# Patient Record
Sex: Male | Born: 1935 | Race: White | Hispanic: No | Smoking: Former smoker
Health system: Southern US, Community
[De-identification: ages and names within clinical notes are randomized; demographics above are authoritative.]

## PROBLEM LIST (undated history)

## (undated) DIAGNOSIS — M48061 Spinal stenosis, lumbar region without neurogenic claudication: Secondary | ICD-10-CM

## (undated) DIAGNOSIS — I214 Non-ST elevation (NSTEMI) myocardial infarction: Secondary | ICD-10-CM

## (undated) DIAGNOSIS — I2510 Atherosclerotic heart disease of native coronary artery without angina pectoris: Principal | ICD-10-CM

## (undated) DIAGNOSIS — I10 Essential (primary) hypertension: Secondary | ICD-10-CM

## (undated) DIAGNOSIS — E785 Hyperlipidemia, unspecified: Secondary | ICD-10-CM

## (undated) DIAGNOSIS — M48062 Spinal stenosis, lumbar region with neurogenic claudication: Secondary | ICD-10-CM

## (undated) DIAGNOSIS — Z9861 Coronary angioplasty status: Principal | ICD-10-CM

## (undated) HISTORY — DX: Spinal stenosis, lumbar region with neurogenic claudication: M48.062

## (undated) HISTORY — DX: Hyperlipidemia, unspecified: E78.5

## (undated) HISTORY — DX: Atherosclerotic heart disease of native coronary artery without angina pectoris: I25.10

## (undated) HISTORY — DX: Spinal stenosis, lumbar region without neurogenic claudication: M48.061

## (undated) HISTORY — DX: Coronary angioplasty status: Z98.61

## (undated) HISTORY — DX: Non-ST elevation (NSTEMI) myocardial infarction: I21.4

## (undated) HISTORY — DX: Essential (primary) hypertension: I10

---

## 1999-09-18 DIAGNOSIS — I2510 Atherosclerotic heart disease of native coronary artery without angina pectoris: Secondary | ICD-10-CM | POA: Insufficient documentation

## 1999-09-18 HISTORY — DX: Atherosclerotic heart disease of native coronary artery without angina pectoris: I25.10

## 1999-09-18 HISTORY — PX: CORONARY STENT INTERVENTION: CATH118234

## 2016-01-10 DIAGNOSIS — R0602 Shortness of breath: Secondary | ICD-10-CM | POA: Diagnosis not present

## 2016-01-10 DIAGNOSIS — R600 Localized edema: Secondary | ICD-10-CM | POA: Diagnosis not present

## 2016-01-10 DIAGNOSIS — N401 Enlarged prostate with lower urinary tract symptoms: Secondary | ICD-10-CM | POA: Diagnosis not present

## 2016-01-10 DIAGNOSIS — R972 Elevated prostate specific antigen [PSA]: Secondary | ICD-10-CM | POA: Diagnosis not present

## 2016-01-12 DIAGNOSIS — N401 Enlarged prostate with lower urinary tract symptoms: Secondary | ICD-10-CM | POA: Diagnosis not present

## 2016-01-12 DIAGNOSIS — Z125 Encounter for screening for malignant neoplasm of prostate: Secondary | ICD-10-CM | POA: Diagnosis not present

## 2016-01-12 DIAGNOSIS — R972 Elevated prostate specific antigen [PSA]: Secondary | ICD-10-CM | POA: Diagnosis not present

## 2016-02-03 DIAGNOSIS — R600 Localized edema: Secondary | ICD-10-CM | POA: Diagnosis not present

## 2016-02-03 DIAGNOSIS — Z9119 Patient's noncompliance with other medical treatment and regimen: Secondary | ICD-10-CM | POA: Diagnosis not present

## 2016-02-03 DIAGNOSIS — Z955 Presence of coronary angioplasty implant and graft: Secondary | ICD-10-CM | POA: Diagnosis not present

## 2016-02-03 DIAGNOSIS — R0600 Dyspnea, unspecified: Secondary | ICD-10-CM | POA: Diagnosis not present

## 2016-02-03 DIAGNOSIS — I2510 Atherosclerotic heart disease of native coronary artery without angina pectoris: Secondary | ICD-10-CM | POA: Diagnosis not present

## 2016-02-10 DIAGNOSIS — I2510 Atherosclerotic heart disease of native coronary artery without angina pectoris: Secondary | ICD-10-CM | POA: Diagnosis not present

## 2016-02-10 DIAGNOSIS — R0600 Dyspnea, unspecified: Secondary | ICD-10-CM | POA: Diagnosis not present

## 2016-02-10 DIAGNOSIS — R0609 Other forms of dyspnea: Secondary | ICD-10-CM | POA: Diagnosis not present

## 2016-02-14 DIAGNOSIS — R0600 Dyspnea, unspecified: Secondary | ICD-10-CM | POA: Diagnosis not present

## 2016-02-14 DIAGNOSIS — I2510 Atherosclerotic heart disease of native coronary artery without angina pectoris: Secondary | ICD-10-CM | POA: Diagnosis not present

## 2016-02-15 DIAGNOSIS — R0600 Dyspnea, unspecified: Secondary | ICD-10-CM | POA: Diagnosis not present

## 2016-02-16 DIAGNOSIS — R0600 Dyspnea, unspecified: Secondary | ICD-10-CM | POA: Diagnosis not present

## 2016-02-16 DIAGNOSIS — I272 Other secondary pulmonary hypertension: Secondary | ICD-10-CM | POA: Diagnosis not present

## 2016-02-16 DIAGNOSIS — R0609 Other forms of dyspnea: Secondary | ICD-10-CM | POA: Diagnosis not present

## 2016-02-16 DIAGNOSIS — I2510 Atherosclerotic heart disease of native coronary artery without angina pectoris: Secondary | ICD-10-CM | POA: Diagnosis not present

## 2016-02-16 DIAGNOSIS — I340 Nonrheumatic mitral (valve) insufficiency: Secondary | ICD-10-CM | POA: Diagnosis not present

## 2016-02-16 DIAGNOSIS — I071 Rheumatic tricuspid insufficiency: Secondary | ICD-10-CM | POA: Diagnosis not present

## 2016-02-20 DIAGNOSIS — I10 Essential (primary) hypertension: Secondary | ICD-10-CM | POA: Diagnosis not present

## 2016-02-20 DIAGNOSIS — E669 Obesity, unspecified: Secondary | ICD-10-CM | POA: Diagnosis not present

## 2016-02-20 DIAGNOSIS — R0609 Other forms of dyspnea: Secondary | ICD-10-CM | POA: Diagnosis not present

## 2016-02-20 DIAGNOSIS — R600 Localized edema: Secondary | ICD-10-CM | POA: Diagnosis not present

## 2016-02-24 DIAGNOSIS — I2510 Atherosclerotic heart disease of native coronary artery without angina pectoris: Secondary | ICD-10-CM | POA: Diagnosis not present

## 2016-02-24 DIAGNOSIS — N400 Enlarged prostate without lower urinary tract symptoms: Secondary | ICD-10-CM | POA: Diagnosis not present

## 2016-02-24 DIAGNOSIS — M79606 Pain in leg, unspecified: Secondary | ICD-10-CM | POA: Diagnosis not present

## 2016-02-24 DIAGNOSIS — E785 Hyperlipidemia, unspecified: Secondary | ICD-10-CM | POA: Diagnosis not present

## 2016-02-24 DIAGNOSIS — R600 Localized edema: Secondary | ICD-10-CM | POA: Diagnosis not present

## 2016-02-24 DIAGNOSIS — M2550 Pain in unspecified joint: Secondary | ICD-10-CM | POA: Diagnosis not present

## 2016-02-27 DIAGNOSIS — M79669 Pain in unspecified lower leg: Secondary | ICD-10-CM | POA: Diagnosis not present

## 2016-02-27 DIAGNOSIS — R609 Edema, unspecified: Secondary | ICD-10-CM | POA: Diagnosis not present

## 2016-03-05 DIAGNOSIS — E785 Hyperlipidemia, unspecified: Secondary | ICD-10-CM | POA: Diagnosis not present

## 2016-03-05 DIAGNOSIS — M79606 Pain in leg, unspecified: Secondary | ICD-10-CM | POA: Diagnosis not present

## 2016-03-05 DIAGNOSIS — R600 Localized edema: Secondary | ICD-10-CM | POA: Diagnosis not present

## 2016-03-05 DIAGNOSIS — M79669 Pain in unspecified lower leg: Secondary | ICD-10-CM | POA: Diagnosis not present

## 2016-03-05 DIAGNOSIS — M2550 Pain in unspecified joint: Secondary | ICD-10-CM | POA: Diagnosis not present

## 2016-03-07 DIAGNOSIS — R739 Hyperglycemia, unspecified: Secondary | ICD-10-CM | POA: Diagnosis not present

## 2016-03-23 DIAGNOSIS — I10 Essential (primary) hypertension: Secondary | ICD-10-CM | POA: Diagnosis not present

## 2016-04-26 DIAGNOSIS — Z23 Encounter for immunization: Secondary | ICD-10-CM | POA: Diagnosis not present

## 2016-05-08 DIAGNOSIS — M79606 Pain in leg, unspecified: Secondary | ICD-10-CM | POA: Diagnosis not present

## 2016-05-08 DIAGNOSIS — M4806 Spinal stenosis, lumbar region: Secondary | ICD-10-CM | POA: Diagnosis not present

## 2016-05-08 DIAGNOSIS — R600 Localized edema: Secondary | ICD-10-CM | POA: Diagnosis not present

## 2016-05-09 DIAGNOSIS — I2510 Atherosclerotic heart disease of native coronary artery without angina pectoris: Secondary | ICD-10-CM | POA: Diagnosis not present

## 2016-05-09 DIAGNOSIS — E7800 Pure hypercholesterolemia, unspecified: Secondary | ICD-10-CM | POA: Diagnosis not present

## 2016-05-09 DIAGNOSIS — Z9119 Patient's noncompliance with other medical treatment and regimen: Secondary | ICD-10-CM | POA: Diagnosis not present

## 2016-06-18 DIAGNOSIS — E7800 Pure hypercholesterolemia, unspecified: Secondary | ICD-10-CM | POA: Diagnosis not present

## 2016-06-18 DIAGNOSIS — I2510 Atherosclerotic heart disease of native coronary artery without angina pectoris: Secondary | ICD-10-CM | POA: Diagnosis not present

## 2016-07-10 DIAGNOSIS — M5416 Radiculopathy, lumbar region: Secondary | ICD-10-CM | POA: Diagnosis not present

## 2016-07-10 DIAGNOSIS — M545 Low back pain: Secondary | ICD-10-CM | POA: Diagnosis not present

## 2016-07-10 DIAGNOSIS — G603 Idiopathic progressive neuropathy: Secondary | ICD-10-CM | POA: Diagnosis not present

## 2016-07-25 DIAGNOSIS — M25561 Pain in right knee: Secondary | ICD-10-CM | POA: Diagnosis not present

## 2016-07-25 DIAGNOSIS — E785 Hyperlipidemia, unspecified: Secondary | ICD-10-CM | POA: Diagnosis not present

## 2016-07-25 DIAGNOSIS — G2581 Restless legs syndrome: Secondary | ICD-10-CM | POA: Diagnosis not present

## 2016-07-25 DIAGNOSIS — M4696 Unspecified inflammatory spondylopathy, lumbar region: Secondary | ICD-10-CM | POA: Diagnosis not present

## 2016-07-25 DIAGNOSIS — I2510 Atherosclerotic heart disease of native coronary artery without angina pectoris: Secondary | ICD-10-CM | POA: Diagnosis not present

## 2016-07-25 DIAGNOSIS — M79606 Pain in leg, unspecified: Secondary | ICD-10-CM | POA: Diagnosis not present

## 2016-07-25 DIAGNOSIS — M4807 Spinal stenosis, lumbosacral region: Secondary | ICD-10-CM | POA: Diagnosis not present

## 2016-08-01 DIAGNOSIS — M4807 Spinal stenosis, lumbosacral region: Secondary | ICD-10-CM | POA: Diagnosis not present

## 2016-08-01 DIAGNOSIS — M25561 Pain in right knee: Secondary | ICD-10-CM | POA: Diagnosis not present

## 2016-08-01 DIAGNOSIS — M4696 Unspecified inflammatory spondylopathy, lumbar region: Secondary | ICD-10-CM | POA: Diagnosis not present

## 2016-08-06 DIAGNOSIS — M4696 Unspecified inflammatory spondylopathy, lumbar region: Secondary | ICD-10-CM | POA: Diagnosis not present

## 2016-08-06 DIAGNOSIS — M4807 Spinal stenosis, lumbosacral region: Secondary | ICD-10-CM | POA: Diagnosis not present

## 2016-08-06 DIAGNOSIS — M25561 Pain in right knee: Secondary | ICD-10-CM | POA: Diagnosis not present

## 2016-08-08 DIAGNOSIS — M4696 Unspecified inflammatory spondylopathy, lumbar region: Secondary | ICD-10-CM | POA: Diagnosis not present

## 2016-08-08 DIAGNOSIS — M4807 Spinal stenosis, lumbosacral region: Secondary | ICD-10-CM | POA: Diagnosis not present

## 2016-08-08 DIAGNOSIS — M25561 Pain in right knee: Secondary | ICD-10-CM | POA: Diagnosis not present

## 2016-08-14 DIAGNOSIS — M4807 Spinal stenosis, lumbosacral region: Secondary | ICD-10-CM | POA: Diagnosis not present

## 2016-08-14 DIAGNOSIS — M25561 Pain in right knee: Secondary | ICD-10-CM | POA: Diagnosis not present

## 2016-08-14 DIAGNOSIS — M4696 Unspecified inflammatory spondylopathy, lumbar region: Secondary | ICD-10-CM | POA: Diagnosis not present

## 2016-08-16 DIAGNOSIS — M25561 Pain in right knee: Secondary | ICD-10-CM | POA: Diagnosis not present

## 2016-08-16 DIAGNOSIS — M4696 Unspecified inflammatory spondylopathy, lumbar region: Secondary | ICD-10-CM | POA: Diagnosis not present

## 2016-08-16 DIAGNOSIS — M4807 Spinal stenosis, lumbosacral region: Secondary | ICD-10-CM | POA: Diagnosis not present

## 2016-08-20 DIAGNOSIS — M4696 Unspecified inflammatory spondylopathy, lumbar region: Secondary | ICD-10-CM | POA: Diagnosis not present

## 2016-08-20 DIAGNOSIS — M4807 Spinal stenosis, lumbosacral region: Secondary | ICD-10-CM | POA: Diagnosis not present

## 2016-08-20 DIAGNOSIS — M25561 Pain in right knee: Secondary | ICD-10-CM | POA: Diagnosis not present

## 2016-08-22 DIAGNOSIS — M4807 Spinal stenosis, lumbosacral region: Secondary | ICD-10-CM | POA: Diagnosis not present

## 2016-08-22 DIAGNOSIS — M25561 Pain in right knee: Secondary | ICD-10-CM | POA: Diagnosis not present

## 2016-08-22 DIAGNOSIS — M4696 Unspecified inflammatory spondylopathy, lumbar region: Secondary | ICD-10-CM | POA: Diagnosis not present

## 2016-08-23 DIAGNOSIS — M151 Heberden's nodes (with arthropathy): Secondary | ICD-10-CM | POA: Diagnosis not present

## 2016-08-27 DIAGNOSIS — M25561 Pain in right knee: Secondary | ICD-10-CM | POA: Diagnosis not present

## 2016-08-27 DIAGNOSIS — M4696 Unspecified inflammatory spondylopathy, lumbar region: Secondary | ICD-10-CM | POA: Diagnosis not present

## 2016-08-27 DIAGNOSIS — E7800 Pure hypercholesterolemia, unspecified: Secondary | ICD-10-CM | POA: Diagnosis not present

## 2016-08-27 DIAGNOSIS — M4807 Spinal stenosis, lumbosacral region: Secondary | ICD-10-CM | POA: Diagnosis not present

## 2016-08-29 DIAGNOSIS — M25561 Pain in right knee: Secondary | ICD-10-CM | POA: Diagnosis not present

## 2016-08-29 DIAGNOSIS — M4696 Unspecified inflammatory spondylopathy, lumbar region: Secondary | ICD-10-CM | POA: Diagnosis not present

## 2016-08-29 DIAGNOSIS — M4807 Spinal stenosis, lumbosacral region: Secondary | ICD-10-CM | POA: Diagnosis not present

## 2016-09-19 DIAGNOSIS — M48062 Spinal stenosis, lumbar region with neurogenic claudication: Secondary | ICD-10-CM | POA: Diagnosis not present

## 2016-09-19 DIAGNOSIS — M4696 Unspecified inflammatory spondylopathy, lumbar region: Secondary | ICD-10-CM | POA: Diagnosis not present

## 2016-09-19 DIAGNOSIS — M25561 Pain in right knee: Secondary | ICD-10-CM | POA: Diagnosis not present

## 2016-09-19 DIAGNOSIS — M4807 Spinal stenosis, lumbosacral region: Secondary | ICD-10-CM | POA: Diagnosis not present

## 2016-09-21 DIAGNOSIS — M25561 Pain in right knee: Secondary | ICD-10-CM | POA: Diagnosis not present

## 2016-09-21 DIAGNOSIS — M4696 Unspecified inflammatory spondylopathy, lumbar region: Secondary | ICD-10-CM | POA: Diagnosis not present

## 2016-09-21 DIAGNOSIS — M4807 Spinal stenosis, lumbosacral region: Secondary | ICD-10-CM | POA: Diagnosis not present

## 2016-09-24 DIAGNOSIS — M25561 Pain in right knee: Secondary | ICD-10-CM | POA: Diagnosis not present

## 2016-09-24 DIAGNOSIS — M4696 Unspecified inflammatory spondylopathy, lumbar region: Secondary | ICD-10-CM | POA: Diagnosis not present

## 2016-09-24 DIAGNOSIS — M4807 Spinal stenosis, lumbosacral region: Secondary | ICD-10-CM | POA: Diagnosis not present

## 2016-10-03 DIAGNOSIS — M4807 Spinal stenosis, lumbosacral region: Secondary | ICD-10-CM | POA: Diagnosis not present

## 2016-10-03 DIAGNOSIS — M4696 Unspecified inflammatory spondylopathy, lumbar region: Secondary | ICD-10-CM | POA: Diagnosis not present

## 2016-10-03 DIAGNOSIS — M25561 Pain in right knee: Secondary | ICD-10-CM | POA: Diagnosis not present

## 2016-10-05 DIAGNOSIS — M4807 Spinal stenosis, lumbosacral region: Secondary | ICD-10-CM | POA: Diagnosis not present

## 2016-10-05 DIAGNOSIS — M4696 Unspecified inflammatory spondylopathy, lumbar region: Secondary | ICD-10-CM | POA: Diagnosis not present

## 2016-10-05 DIAGNOSIS — M25561 Pain in right knee: Secondary | ICD-10-CM | POA: Diagnosis not present

## 2016-10-09 DIAGNOSIS — M25561 Pain in right knee: Secondary | ICD-10-CM | POA: Diagnosis not present

## 2016-10-09 DIAGNOSIS — M4696 Unspecified inflammatory spondylopathy, lumbar region: Secondary | ICD-10-CM | POA: Diagnosis not present

## 2016-10-09 DIAGNOSIS — M4807 Spinal stenosis, lumbosacral region: Secondary | ICD-10-CM | POA: Diagnosis not present

## 2016-10-11 DIAGNOSIS — M4696 Unspecified inflammatory spondylopathy, lumbar region: Secondary | ICD-10-CM | POA: Diagnosis not present

## 2016-10-11 DIAGNOSIS — M4807 Spinal stenosis, lumbosacral region: Secondary | ICD-10-CM | POA: Diagnosis not present

## 2016-10-11 DIAGNOSIS — M25561 Pain in right knee: Secondary | ICD-10-CM | POA: Diagnosis not present

## 2016-10-16 DIAGNOSIS — M25561 Pain in right knee: Secondary | ICD-10-CM | POA: Diagnosis not present

## 2016-10-16 DIAGNOSIS — M4696 Unspecified inflammatory spondylopathy, lumbar region: Secondary | ICD-10-CM | POA: Diagnosis not present

## 2016-10-16 DIAGNOSIS — M4807 Spinal stenosis, lumbosacral region: Secondary | ICD-10-CM | POA: Diagnosis not present

## 2016-10-18 DIAGNOSIS — M25561 Pain in right knee: Secondary | ICD-10-CM | POA: Diagnosis not present

## 2016-10-18 DIAGNOSIS — M4696 Unspecified inflammatory spondylopathy, lumbar region: Secondary | ICD-10-CM | POA: Diagnosis not present

## 2016-10-18 DIAGNOSIS — M4807 Spinal stenosis, lumbosacral region: Secondary | ICD-10-CM | POA: Diagnosis not present

## 2016-10-23 DIAGNOSIS — M4696 Unspecified inflammatory spondylopathy, lumbar region: Secondary | ICD-10-CM | POA: Diagnosis not present

## 2016-10-23 DIAGNOSIS — M25561 Pain in right knee: Secondary | ICD-10-CM | POA: Diagnosis not present

## 2016-10-23 DIAGNOSIS — M4807 Spinal stenosis, lumbosacral region: Secondary | ICD-10-CM | POA: Diagnosis not present

## 2016-10-25 DIAGNOSIS — M25561 Pain in right knee: Secondary | ICD-10-CM | POA: Diagnosis not present

## 2016-10-25 DIAGNOSIS — M4696 Unspecified inflammatory spondylopathy, lumbar region: Secondary | ICD-10-CM | POA: Diagnosis not present

## 2016-10-25 DIAGNOSIS — M4807 Spinal stenosis, lumbosacral region: Secondary | ICD-10-CM | POA: Diagnosis not present

## 2016-10-30 DIAGNOSIS — M4696 Unspecified inflammatory spondylopathy, lumbar region: Secondary | ICD-10-CM | POA: Diagnosis not present

## 2016-10-30 DIAGNOSIS — M4807 Spinal stenosis, lumbosacral region: Secondary | ICD-10-CM | POA: Diagnosis not present

## 2016-10-30 DIAGNOSIS — M25561 Pain in right knee: Secondary | ICD-10-CM | POA: Diagnosis not present

## 2016-11-01 DIAGNOSIS — M4807 Spinal stenosis, lumbosacral region: Secondary | ICD-10-CM | POA: Diagnosis not present

## 2016-11-01 DIAGNOSIS — M4696 Unspecified inflammatory spondylopathy, lumbar region: Secondary | ICD-10-CM | POA: Diagnosis not present

## 2016-11-01 DIAGNOSIS — M25561 Pain in right knee: Secondary | ICD-10-CM | POA: Diagnosis not present

## 2016-11-13 DIAGNOSIS — M4807 Spinal stenosis, lumbosacral region: Secondary | ICD-10-CM | POA: Diagnosis not present

## 2016-11-13 DIAGNOSIS — E785 Hyperlipidemia, unspecified: Secondary | ICD-10-CM | POA: Diagnosis not present

## 2016-11-13 DIAGNOSIS — G2581 Restless legs syndrome: Secondary | ICD-10-CM | POA: Diagnosis not present

## 2016-11-13 DIAGNOSIS — R739 Hyperglycemia, unspecified: Secondary | ICD-10-CM | POA: Diagnosis not present

## 2016-11-13 DIAGNOSIS — N400 Enlarged prostate without lower urinary tract symptoms: Secondary | ICD-10-CM | POA: Diagnosis not present

## 2016-11-13 DIAGNOSIS — I2510 Atherosclerotic heart disease of native coronary artery without angina pectoris: Secondary | ICD-10-CM | POA: Diagnosis not present

## 2016-11-13 DIAGNOSIS — R600 Localized edema: Secondary | ICD-10-CM | POA: Diagnosis not present

## 2016-11-27 DIAGNOSIS — Z955 Presence of coronary angioplasty implant and graft: Secondary | ICD-10-CM | POA: Diagnosis not present

## 2016-11-27 DIAGNOSIS — E7800 Pure hypercholesterolemia, unspecified: Secondary | ICD-10-CM | POA: Diagnosis not present

## 2016-11-27 DIAGNOSIS — I10 Essential (primary) hypertension: Secondary | ICD-10-CM | POA: Diagnosis not present

## 2016-11-27 DIAGNOSIS — Z9189 Other specified personal risk factors, not elsewhere classified: Secondary | ICD-10-CM | POA: Diagnosis not present

## 2016-11-27 DIAGNOSIS — I2510 Atherosclerotic heart disease of native coronary artery without angina pectoris: Secondary | ICD-10-CM | POA: Diagnosis not present

## 2016-11-28 DIAGNOSIS — I2510 Atherosclerotic heart disease of native coronary artery without angina pectoris: Secondary | ICD-10-CM | POA: Diagnosis not present

## 2016-11-28 DIAGNOSIS — E7800 Pure hypercholesterolemia, unspecified: Secondary | ICD-10-CM | POA: Diagnosis not present

## 2016-12-17 DIAGNOSIS — E7800 Pure hypercholesterolemia, unspecified: Secondary | ICD-10-CM | POA: Diagnosis not present

## 2016-12-17 DIAGNOSIS — I2510 Atherosclerotic heart disease of native coronary artery without angina pectoris: Secondary | ICD-10-CM | POA: Diagnosis not present

## 2016-12-19 DIAGNOSIS — N401 Enlarged prostate with lower urinary tract symptoms: Secondary | ICD-10-CM | POA: Diagnosis not present

## 2016-12-19 DIAGNOSIS — R972 Elevated prostate specific antigen [PSA]: Secondary | ICD-10-CM | POA: Diagnosis not present

## 2016-12-31 DIAGNOSIS — R972 Elevated prostate specific antigen [PSA]: Secondary | ICD-10-CM | POA: Diagnosis not present

## 2016-12-31 DIAGNOSIS — I10 Essential (primary) hypertension: Secondary | ICD-10-CM | POA: Diagnosis not present

## 2017-01-22 DIAGNOSIS — G8929 Other chronic pain: Secondary | ICD-10-CM | POA: Diagnosis not present

## 2017-01-22 DIAGNOSIS — N400 Enlarged prostate without lower urinary tract symptoms: Secondary | ICD-10-CM | POA: Diagnosis not present

## 2017-01-22 DIAGNOSIS — M25561 Pain in right knee: Secondary | ICD-10-CM | POA: Diagnosis not present

## 2017-01-22 DIAGNOSIS — G2581 Restless legs syndrome: Secondary | ICD-10-CM | POA: Diagnosis not present

## 2017-01-22 DIAGNOSIS — M25562 Pain in left knee: Secondary | ICD-10-CM | POA: Diagnosis not present

## 2017-01-22 DIAGNOSIS — R739 Hyperglycemia, unspecified: Secondary | ICD-10-CM | POA: Diagnosis not present

## 2017-01-22 DIAGNOSIS — I2510 Atherosclerotic heart disease of native coronary artery without angina pectoris: Secondary | ICD-10-CM | POA: Diagnosis not present

## 2017-01-22 DIAGNOSIS — Z955 Presence of coronary angioplasty implant and graft: Secondary | ICD-10-CM | POA: Diagnosis not present

## 2017-01-22 DIAGNOSIS — E785 Hyperlipidemia, unspecified: Secondary | ICD-10-CM | POA: Diagnosis not present

## 2017-01-22 DIAGNOSIS — M4807 Spinal stenosis, lumbosacral region: Secondary | ICD-10-CM | POA: Diagnosis not present

## 2017-02-05 DIAGNOSIS — E7800 Pure hypercholesterolemia, unspecified: Secondary | ICD-10-CM | POA: Diagnosis not present

## 2017-02-05 DIAGNOSIS — M4696 Unspecified inflammatory spondylopathy, lumbar region: Secondary | ICD-10-CM | POA: Diagnosis not present

## 2017-02-05 DIAGNOSIS — M170 Bilateral primary osteoarthritis of knee: Secondary | ICD-10-CM | POA: Diagnosis not present

## 2017-05-15 DIAGNOSIS — I10 Essential (primary) hypertension: Secondary | ICD-10-CM | POA: Diagnosis not present

## 2017-05-15 DIAGNOSIS — E785 Hyperlipidemia, unspecified: Secondary | ICD-10-CM | POA: Diagnosis not present

## 2017-05-15 DIAGNOSIS — Z0000 Encounter for general adult medical examination without abnormal findings: Secondary | ICD-10-CM | POA: Diagnosis not present

## 2017-05-27 DIAGNOSIS — M545 Low back pain: Secondary | ICD-10-CM | POA: Diagnosis not present

## 2017-05-27 DIAGNOSIS — M62830 Muscle spasm of back: Secondary | ICD-10-CM | POA: Diagnosis not present

## 2017-05-27 DIAGNOSIS — M5136 Other intervertebral disc degeneration, lumbar region: Secondary | ICD-10-CM | POA: Diagnosis not present

## 2017-06-05 DIAGNOSIS — N5201 Erectile dysfunction due to arterial insufficiency: Secondary | ICD-10-CM | POA: Diagnosis not present

## 2017-06-05 DIAGNOSIS — N401 Enlarged prostate with lower urinary tract symptoms: Secondary | ICD-10-CM | POA: Diagnosis not present

## 2017-06-05 DIAGNOSIS — R350 Frequency of micturition: Secondary | ICD-10-CM | POA: Diagnosis not present

## 2017-06-05 DIAGNOSIS — N138 Other obstructive and reflux uropathy: Secondary | ICD-10-CM | POA: Diagnosis not present

## 2017-06-17 DIAGNOSIS — I2510 Atherosclerotic heart disease of native coronary artery without angina pectoris: Secondary | ICD-10-CM | POA: Diagnosis not present

## 2017-06-17 DIAGNOSIS — I10 Essential (primary) hypertension: Secondary | ICD-10-CM | POA: Diagnosis not present

## 2017-06-17 DIAGNOSIS — E782 Mixed hyperlipidemia: Secondary | ICD-10-CM | POA: Diagnosis not present

## 2017-06-17 DIAGNOSIS — Z955 Presence of coronary angioplasty implant and graft: Secondary | ICD-10-CM | POA: Diagnosis not present

## 2017-06-23 DIAGNOSIS — Z23 Encounter for immunization: Secondary | ICD-10-CM | POA: Diagnosis not present

## 2017-07-03 ENCOUNTER — Encounter (HOSPITAL_BASED_OUTPATIENT_CLINIC_OR_DEPARTMENT_OTHER): Payer: Self-pay | Admitting: *Deleted

## 2017-07-03 ENCOUNTER — Emergency Department (HOSPITAL_BASED_OUTPATIENT_CLINIC_OR_DEPARTMENT_OTHER)
Admission: EM | Admit: 2017-07-03 | Discharge: 2017-07-03 | Disposition: A | Discharge status: Home / Self Care | Payer: Medicare Other | Attending: Emergency Medicine | Admitting: Emergency Medicine

## 2017-07-03 ENCOUNTER — Emergency Department (HOSPITAL_BASED_OUTPATIENT_CLINIC_OR_DEPARTMENT_OTHER): Payer: Medicare Other

## 2017-07-03 DIAGNOSIS — R42 Dizziness and giddiness: Secondary | ICD-10-CM | POA: Diagnosis not present

## 2017-07-03 DIAGNOSIS — Z7982 Long term (current) use of aspirin: Secondary | ICD-10-CM | POA: Diagnosis not present

## 2017-07-03 DIAGNOSIS — Z955 Presence of coronary angioplasty implant and graft: Secondary | ICD-10-CM | POA: Diagnosis not present

## 2017-07-03 DIAGNOSIS — Y829 Unspecified medical devices associated with adverse incidents: Secondary | ICD-10-CM | POA: Diagnosis not present

## 2017-07-03 DIAGNOSIS — R4589 Other symptoms and signs involving emotional state: Secondary | ICD-10-CM | POA: Diagnosis not present

## 2017-07-03 DIAGNOSIS — Z79899 Other long term (current) drug therapy: Secondary | ICD-10-CM | POA: Insufficient documentation

## 2017-07-03 DIAGNOSIS — T50B95A Adverse effect of other viral vaccines, initial encounter: Secondary | ICD-10-CM | POA: Diagnosis not present

## 2017-07-03 DIAGNOSIS — I10 Essential (primary) hypertension: Secondary | ICD-10-CM | POA: Diagnosis not present

## 2017-07-03 DIAGNOSIS — I2510 Atherosclerotic heart disease of native coronary artery without angina pectoris: Secondary | ICD-10-CM | POA: Diagnosis not present

## 2017-07-03 DIAGNOSIS — R9431 Abnormal electrocardiogram [ECG] [EKG]: Secondary | ICD-10-CM | POA: Diagnosis not present

## 2017-07-03 DIAGNOSIS — T887XXA Unspecified adverse effect of drug or medicament, initial encounter: Secondary | ICD-10-CM | POA: Diagnosis not present

## 2017-07-03 DIAGNOSIS — T50Z95A Adverse effect of other vaccines and biological substances, initial encounter: Secondary | ICD-10-CM | POA: Diagnosis not present

## 2017-07-03 DIAGNOSIS — R6889 Other general symptoms and signs: Secondary | ICD-10-CM | POA: Diagnosis not present

## 2017-07-03 DIAGNOSIS — R51 Headache: Secondary | ICD-10-CM | POA: Diagnosis present

## 2017-07-03 LAB — CBC WITH DIFFERENTIAL/PLATELET
Basophils Absolute: 0 10*3/uL (ref 0.0–0.1)
Basophils Relative: 1 %
EOS ABS: 0.1 10*3/uL (ref 0.0–0.7)
Eosinophils Relative: 1 %
HEMATOCRIT: 39.4 % (ref 39.0–52.0)
HEMOGLOBIN: 13.5 g/dL (ref 13.0–17.0)
LYMPHS ABS: 1.1 10*3/uL (ref 0.7–4.0)
LYMPHS PCT: 23 %
MCH: 30.5 pg (ref 26.0–34.0)
MCHC: 34.3 g/dL (ref 30.0–36.0)
MCV: 89.1 fL (ref 78.0–100.0)
Monocytes Absolute: 0.5 10*3/uL (ref 0.1–1.0)
Monocytes Relative: 10 %
NEUTROS ABS: 3.1 10*3/uL (ref 1.7–7.7)
NEUTROS PCT: 65 %
Platelets: 190 10*3/uL (ref 150–400)
RBC: 4.42 MIL/uL (ref 4.22–5.81)
RDW: 13 % (ref 11.5–15.5)
WBC: 4.7 10*3/uL (ref 4.0–10.5)

## 2017-07-03 LAB — URINALYSIS, ROUTINE W REFLEX MICROSCOPIC
BILIRUBIN URINE: NEGATIVE
GLUCOSE, UA: NEGATIVE mg/dL
HGB URINE DIPSTICK: NEGATIVE
Ketones, ur: NEGATIVE mg/dL
Leukocytes, UA: NEGATIVE
Nitrite: NEGATIVE
PH: 6 (ref 5.0–8.0)
Protein, ur: NEGATIVE mg/dL
SPECIFIC GRAVITY, URINE: 1.02 (ref 1.005–1.030)

## 2017-07-03 LAB — COMPREHENSIVE METABOLIC PANEL
ALK PHOS: 53 U/L (ref 38–126)
ALT: 14 U/L — AB (ref 17–63)
ANION GAP: 6 (ref 5–15)
AST: 22 U/L (ref 15–41)
Albumin: 4 g/dL (ref 3.5–5.0)
BILIRUBIN TOTAL: 0.6 mg/dL (ref 0.3–1.2)
BUN: 14 mg/dL (ref 6–20)
CALCIUM: 8.9 mg/dL (ref 8.9–10.3)
CO2: 26 mmol/L (ref 22–32)
CREATININE: 1.06 mg/dL (ref 0.61–1.24)
Chloride: 108 mmol/L (ref 101–111)
GFR calc non Af Amer: >60 mL/min (ref >60)
GLUCOSE: 111 mg/dL — AB (ref 65–99)
Potassium: 3.4 mmol/L — ABNORMAL LOW (ref 3.5–5.1)
Sodium: 140 mmol/L (ref 135–145)
TOTAL PROTEIN: 6.7 g/dL (ref 6.5–8.1)

## 2017-07-03 LAB — LIPASE, BLOOD: Lipase: 27 U/L (ref 11–51)

## 2017-07-03 LAB — TROPONIN I

## 2017-07-03 MED ORDER — SODIUM CHLORIDE 0.9 % IV BOLUS (SEPSIS)
1000.0000 mL | Freq: Once | INTRAVENOUS | Status: AC
  Administered 2017-07-03: 1000 mL via INTRAVENOUS

## 2017-07-03 MED ORDER — ONDANSETRON 4 MG PO TBDP: ORAL_TABLET | ORAL | 0 refills | Status: DC

## 2017-07-03 MED ORDER — DIPHENHYDRAMINE HCL 12.5 MG/5ML PO ELIX
12.5000 mg | ORAL_SOLUTION | Freq: Once | ORAL | Status: DC
  Filled 2017-07-03: qty 10

## 2017-07-03 MED ORDER — PROCHLORPERAZINE EDISYLATE 5 MG/ML IJ SOLN
5.0000 mg | Freq: Once | INTRAMUSCULAR | Status: AC
  Administered 2017-07-03: 5 mg via INTRAVENOUS
  Filled 2017-07-03: qty 2

## 2017-07-03 MED ORDER — DIPHENHYDRAMINE HCL 50 MG/ML IJ SOLN
12.5000 mg | Freq: Once | INTRAMUSCULAR | Status: DC
  Filled 2017-07-03: qty 1

## 2017-07-03 MED ORDER — DIPHENHYDRAMINE HCL 12.5 MG/5ML PO ELIX: 25.0000 mg | ORAL_SOLUTION | Freq: Once | ORAL | Status: DC

## 2017-07-03 NOTE — Discharge Instructions (Signed)
Return for worsening symptoms.    

## 2017-07-03 NOTE — ED Triage Notes (Addendum)
Pt reports dizziness and intermittent nausea x 2-3 days ago that has now subsided. Repots that he went to his PCP today and they advised him come to the ED to r/o an MI. Pt denies pain, SOB, dizziness, nausea at this time. Reports a very slight HA.

## 2017-07-03 NOTE — ED Provider Notes (Signed)
[facility] EMERGENCY DEPARTMENT Provider Note   CSN: [id number] Arrival date & time: 07/03/17  2458     History   Chief Complaint Chief Complaint  Patient presents with  . Headache    HPI Noah Scott is a 81 y.o. male.  81 yo M with a chief complaints of generalized fatigue and just feeling unwell. This is been going on for the past couple days ever since he received his influenza vaccine as well as his shingles vaccine. He felt that he should be getting a little bit better today and so went to urgent care. While there they became concerned that EKG was abnormal and so sent him here. The patient denies any chest pain or shortness of breath. Denies cough congestion or fever. Has had some subjective chills. Felt that his abdomen was gurgling and has not felt like eating. Denies abdominal pain. Denies diarrhea. The patient has had a slight biparietal headache, denies any sore throat ear pain and facial pain. Denies any dysuria or flank pain.   The history is provided by the patient.  Headache   Pertinent negatives include no fever, no palpitations, no shortness of breath and no vomiting.  Illness  This is a new problem. The current episode started more than 2 days ago. The problem occurs constantly. The problem has not changed since onset.Associated symptoms include headaches. Pertinent negatives include no chest pain, no abdominal pain and no shortness of breath. Nothing aggravates the symptoms. Nothing relieves the symptoms. He has tried nothing for the symptoms. The treatment provided no relief.    Past Medical History:  Diagnosis Date  . CAD (coronary artery disease)   . HTN (hypertension)   . Hyperlipemia     There are no active problems to display for this patient.   Past Surgical History:  Procedure Laterality Date  . CORONARY ANGIOPLASTY WITH STENT PLACEMENT         Home Medications    Prior to Admission medications   Medication Sig Start Date End  Date Taking? Authorizing Provider  aspirin EC 81 MG tablet Take 81 mg by mouth daily.   Yes [provider]  doxazosin (CARDURA) 8 MG tablet Take 8 mg by mouth daily.   Yes [provider]  ezetimibe (ZETIA) 10 MG tablet Take 10 mg by mouth daily.   Yes [provider]  furosemide (LASIX) 20 MG tablet Take 20 mg by mouth.   Yes [provider]  losartan (COZAAR) 25 MG tablet Take 25 mg by mouth daily.   Yes [provider]  naproxen (NAPROSYN) 500 MG tablet Take 500 mg by mouth 2 (two) times daily with a meal.   Yes [provider]  potassium chloride SA (K-DUR,KLOR-CON) 20 MEQ tablet Take 20 mEq by mouth 2 (two) times daily.   Yes [provider]  rosuvastatin (CRESTOR) 20 MG tablet Take 20 mg by mouth daily.   Yes [provider]  ondansetron (ZOFRAN ODT) 4 MG disintegrating tablet 4mg  ODT q4 hours prn nausea/vomit 07/03/17   Deno Etienne, DO    Family History History reviewed. No pertinent family history.  Social History Social History  Substance Use Topics  . Smoking status: Never Smoker  . Smokeless tobacco: Never Used  . Alcohol use No     Allergies   Patient has no known allergies.   Review of Systems Review of Systems  Constitutional: Negative for chills and fever.  HENT: Negative for congestion and facial swelling.  Eyes: Negative for discharge and visual disturbance.  Respiratory: Negative for shortness of breath.   Cardiovascular: Negative for chest pain and palpitations.  Gastrointestinal: Negative for abdominal pain, diarrhea and vomiting.       Abdominal gurgling.   Musculoskeletal: Negative for arthralgias and myalgias.  Skin: Negative for color change and rash.  Neurological: Positive for weakness and headaches. Negative for tremors and syncope.  Psychiatric/Behavioral: Negative for confusion and dysphoric mood.     Physical Exam Updated Vital Signs BP (!) 171/95 (BP Location: Right  Arm)   Pulse 61   Temp 98.6 F (37 C) (Oral)   Resp 18   Ht 5' 10.5" (1.791 m)   Wt 108.9 kg (240 lb)   SpO2 100%   BMI 33.95 kg/m   Physical Exam  Constitutional: He is oriented to person, place, and time. He appears well-developed and well-nourished.  HENT:  Head: Normocephalic and atraumatic.  Eyes: Pupils are equal, round, and reactive to light. EOM are normal.  Neck: Normal range of motion. Neck supple. No JVD present.  Cardiovascular: Normal rate and regular rhythm.  Exam reveals no gallop and no friction rub.   No murmur heard. Pulmonary/Chest: No respiratory distress. He has no wheezes.  Abdominal: He exhibits no distension and no mass. There is no tenderness. There is no rebound and no guarding.  Musculoskeletal: Normal range of motion.  Neurological: He is alert and oriented to person, place, and time. He has normal strength. No cranial nerve deficit or sensory deficit. He displays a negative Romberg sign. Coordination and gait normal. GCS eye subscore is 4. GCS verbal subscore is 5. GCS motor subscore is 6.  Reflex Scores:      Tricep reflexes are 2+ on the right side and 2+ on the left side.      Bicep reflexes are 2+ on the right side and 2+ on the left side.      Brachioradialis reflexes are 2+ on the right side and 2+ on the left side.      Patellar reflexes are 2+ on the right side and 2+ on the left side.      Achilles reflexes are 2+ on the right side and 2+ on the left side. Skin: No rash noted. No pallor.  Psychiatric: He has a normal mood and affect. His behavior is normal.  Nursing note and vitals reviewed.    ED Treatments / Results  Labs (all labs ordered are listed, but only abnormal results are displayed) Labs Reviewed  COMPREHENSIVE METABOLIC PANEL - Abnormal; Notable for the following:       Result Value   Potassium 3.4 (*)    Glucose, Bld 111 (*)    ALT 14 (*)    All other components within normal limits  CBC WITH DIFFERENTIAL/PLATELET    URINALYSIS, ROUTINE W REFLEX MICROSCOPIC  LIPASE, BLOOD  TROPONIN I    EKG  EKG Interpretation  Date/Time:  Wednesday July 03 2017 09:57:53 EDT Ventricular Rate:  68 PR Interval:    QRS Duration: 110 QT Interval:  425 QTC Calculation: 452 R Axis:   -36 Text Interpretation:  Sinus rhythm Atrial premature complex Left axis deviation Abnormal R-wave progression, late transition No old tracing to compare Confirmed by Deno Etienne [id number]) on 07/03/2017 10:03:56 AM       Radiology Dg Chest 2 View  Result Date: 07/03/2017 CLINICAL DATA:  Nausea and dizziness.  Hypertension EXAM: CHEST  2 VIEW COMPARISON:  None. FINDINGS: There is no edema or  consolidation. Heart size and pulmonary vascularity are normal. No adenopathy. Aorta is mildly tortuous. There is degenerative change in the thoracic spine. IMPRESSION: No edema or consolidation. Electronically Signed   By: Lowella Grip III M.D.   On: 07/03/2017 10:32    Procedures Procedures (including critical care time)  Medications Ordered in ED Medications  diphenhydrAMINE (BENADRYL) 12.5 MG/5ML elixir 25 mg (not administered)  sodium chloride 0.9 % bolus 1,000 mL (1,000 mLs Intravenous New Bag/Given 07/03/17 1042)  prochlorperazine (COMPAZINE) injection 5 mg (5 mg Intravenous Given 07/03/17 1050)     Initial Impression / Assessment and Plan / ED Course  I have reviewed the triage vital signs and the nursing notes.  Pertinent labs & imaging results that were available during my care of the patient were reviewed by me and considered in my medical decision making (see chart for details).     81 yo M with a chief complaint of feeling unwell after having his influenza and shingles vaccination a couple days ago. Was sent from urgent care for an abnormal EKG. I evaluated the EKG and feel it's likely due to baseline wander. EKG performed here shows no concerning findings. Patient denies any cardiac symptoms. Symptoms sound most likely  related to his vaccinations. I will check basic labs obtain a chest x-ray UA give a bolus of fluids. Reevaluate.  Patient with improved symptoms.  Ambulatory.  Tolerating PO.    12:12 PM:  I have discussed the diagnosis/risks/treatment options with the patient and believe the pt to be eligible for discharge home to follow-up with PCP. We also discussed returning to the ED immediately if new or worsening sx occur. We discussed the sx which are most concerning (e.g., sudden worsening pain, fever, inability to tolerate by mouth) that necessitate immediate return. Medications administered to the patient during their visit and any new prescriptions provided to the patient are listed below.  Medications given during this visit Medications  diphenhydrAMINE (BENADRYL) 12.5 MG/5ML elixir 25 mg (not administered)  sodium chloride 0.9 % bolus 1,000 mL (1,000 mLs Intravenous New Bag/Given 07/03/17 1042)  prochlorperazine (COMPAZINE) injection 5 mg (5 mg Intravenous Given 07/03/17 1050)     The patient appears reasonably screen and/or stabilized for discharge and I doubt any other medical condition or other [facility] requiring further screening, evaluation, or treatment in the ED at this time prior to discharge.    Final Clinical Impressions(s) / ED Diagnoses   Final diagnoses:  Ill feeling  Vaccine reaction, initial encounter    New Prescriptions New Prescriptions   ONDANSETRON (ZOFRAN ODT) 4 MG DISINTEGRATING TABLET    4mg  ODT q4 hours prn nausea/vomit     Deno Etienne, DO 07/03/17 1212

## 2017-08-14 DIAGNOSIS — E876 Hypokalemia: Secondary | ICD-10-CM | POA: Diagnosis not present

## 2017-08-14 DIAGNOSIS — Z87891 Personal history of nicotine dependence: Secondary | ICD-10-CM | POA: Diagnosis not present

## 2017-08-14 DIAGNOSIS — I10 Essential (primary) hypertension: Secondary | ICD-10-CM | POA: Diagnosis not present

## 2017-09-07 ENCOUNTER — Other Ambulatory Visit: Payer: Self-pay

## 2017-09-07 ENCOUNTER — Encounter (HOSPITAL_BASED_OUTPATIENT_CLINIC_OR_DEPARTMENT_OTHER): Payer: Self-pay | Admitting: Emergency Medicine

## 2017-09-07 ENCOUNTER — Emergency Department (HOSPITAL_BASED_OUTPATIENT_CLINIC_OR_DEPARTMENT_OTHER)
Admission: EM | Admit: 2017-09-07 | Discharge: 2017-09-07 | Disposition: A | Discharge status: Home / Self Care | Payer: Medicare Other | Attending: Emergency Medicine | Admitting: Emergency Medicine

## 2017-09-07 DIAGNOSIS — I10 Essential (primary) hypertension: Secondary | ICD-10-CM | POA: Insufficient documentation

## 2017-09-07 DIAGNOSIS — H1033 Unspecified acute conjunctivitis, bilateral: Secondary | ICD-10-CM | POA: Diagnosis not present

## 2017-09-07 DIAGNOSIS — I2510 Atherosclerotic heart disease of native coronary artery without angina pectoris: Secondary | ICD-10-CM | POA: Diagnosis not present

## 2017-09-07 DIAGNOSIS — H579 Unspecified disorder of eye and adnexa: Secondary | ICD-10-CM | POA: Diagnosis present

## 2017-09-07 MED ORDER — POLYMYXIN B-TRIMETHOPRIM 10000-0.1 UNIT/ML-% OP SOLN: 2.0000 [drp] | Freq: Four times a day (QID) | OPHTHALMIC | 0 refills | Status: AC

## 2017-09-07 NOTE — ED Triage Notes (Signed)
PT presents with c/o pink to both eyes since he woke up this morning

## 2017-09-07 NOTE — ED Notes (Signed)
Pt states he woke up this morning with a crusty drainage on both of his eyelids. Some blurred vision with same. Denies itching or other s/s.

## 2017-09-07 NOTE — Discharge Instructions (Signed)
You were seen in the ED with eye redness and drainage. We are starting you on drops which should help but this may take several days to fully resolve. Wash your hands frequently. Return to the ED with any new or worsening symptoms.

## 2017-09-07 NOTE — ED Provider Notes (Signed)
Emergency Department Provider Note   I have reviewed the triage vital signs and the nursing notes.   HISTORY  Chief Complaint Conjunctivitis   HPI Noah Scott is a 81 y.o. male presents to the emergency department for evaluation of bilateral eye redness and discharge.  He denies any pain or vision changes.  No trauma to the eye.  He has had some recent sneezing and mild runny nose.  When he woke up this morning he states his eyelids were stuck together and he had to pull them apart.  He is continued to have discharge throughout the day.  No pain with eye movement.  No sore throat, fevers, chills.    Past Medical History:  Diagnosis Date  . CAD (coronary artery disease)   . HTN (hypertension)   . Hyperlipemia     There are no active problems to display for this patient.   Past Surgical History:  Procedure Laterality Date  . CORONARY ANGIOPLASTY WITH STENT PLACEMENT      Current Outpatient Rx  . Order #: [id number] Class: Historical Med  . Order #: [id number] Class: Historical Med  . Order #: [id number] Class: Historical Med  . Order #: [id number] Class: Historical Med  . Order #: [id number] Class: Historical Med  . Order #: [id number] Class: Historical Med  . Order #: [id number] Class: Print  . Order #: [id number] Class: Historical Med  . Order #: [id number] Class: Historical Med  . Order #: [id number] Class: Print    Allergies Patient has no known allergies.  No family history on file.  Social History Social History   Tobacco Use  . Smoking status: Never Smoker  . Smokeless tobacco: Never Used  Substance Use Topics  . Alcohol use: No  . Drug use: No    Review of Systems  Constitutional: No fever/chills Eyes: No visual changes. Positive eye redness and discharge.  ENT: No sore throat. Cardiovascular: Denies chest pain. Respiratory: Denies shortness of breath. Gastrointestinal: No abdominal pain.  No nausea, no vomiting.  No diarrhea.  No constipation. Genitourinary:  Negative for dysuria. Musculoskeletal: Negative for back pain. Skin: Negative for rash. Neurological: Negative for headaches, focal weakness or numbness.  10-point ROS otherwise negative.  ____________________________________________   PHYSICAL EXAM:  VITAL SIGNS: ED Triage Vitals [09/07/17 1943]  Enc Vitals Group     BP (!) 146/79     Pulse Rate 72     Resp 18     Temp 97.9 F (36.6 C)     Temp Source Oral     SpO2 98 %   Constitutional: Alert and oriented. Well appearing and in no acute distress. Eyes: Conjunctivae are injected bilateral R>L. PERRL. EOMI. Head: Atraumatic. Nose: No congestion/rhinnorhea. Mouth/Throat: Mucous membranes are moist. Neck: No stridor.  Cardiovascular:  Good peripheral circulation.  Respiratory: Normal respiratory effort. Gastrointestinal:  No distention.  Musculoskeletal: No lower extremity tenderness nor edema. No gross deformities of extremities. Neurologic:  Normal speech and language. No gross focal neurologic deficits are appreciated.  Skin:  Skin is warm, dry and intact. No rash noted.  ____________________________________________   PROCEDURES  Procedure(s) performed:   Procedures  None ____________________________________________   INITIAL IMPRESSION / ASSESSMENT AND PLAN / ED COURSE  Pertinent labs & imaging results that were available during my care of the patient were reviewed by me and considered in my medical decision making (see chart for details).  Patient presents emergency department for evaluation of eye redness and discharge.  Right eye slightly worse than the left.  No periorbital edema.  Exam  not consistent with orbital cellulitis.  Plan for Polytrim drops.  Patient does not use contact lenses.   At this time, I do not feel there is any life-threatening condition present. I have reviewed and discussed all results (EKG, imaging, lab, urine as appropriate), exam findings with patient. I have reviewed nursing  notes and appropriate previous records.  I feel the patient is safe to be discharged home without further emergent workup. Discussed usual and customary return precautions. Patient and family (if present) verbalize understanding and are comfortable with this plan.  Patient will follow-up with their primary care provider. If they do not have a primary care provider, information for follow-up has been provided to them. All questions have been answered.  ____________________________________________  FINAL CLINICAL IMPRESSION(S) / ED DIAGNOSES  Final diagnoses:  Acute conjunctivitis of both eyes, unspecified acute conjunctivitis type    NEW OUTPATIENT MEDICATIONS STARTED DURING THIS VISIT:  Polytrim drops x 7 days.   Note:  This document was prepared using Dragon voice recognition software and may include unintentional dictation errors.  Nanda Quinton, MD Emergency Medicine    Leonidas Boateng, [facility], MD 09/07/17 2001

## 2017-09-07 NOTE — ED Notes (Signed)
Pt given d/c instructions as per chart. Rx x 1. Verbalizes understanding. No questions. 

## 2017-09-12 DIAGNOSIS — H01002 Unspecified blepharitis right lower eyelid: Secondary | ICD-10-CM | POA: Diagnosis not present

## 2017-09-12 DIAGNOSIS — B301 Conjunctivitis due to adenovirus: Secondary | ICD-10-CM | POA: Diagnosis not present

## 2017-09-12 DIAGNOSIS — H2513 Age-related nuclear cataract, bilateral: Secondary | ICD-10-CM | POA: Diagnosis not present

## 2017-09-12 DIAGNOSIS — H01001 Unspecified blepharitis right upper eyelid: Secondary | ICD-10-CM | POA: Diagnosis not present

## 2017-09-14 ENCOUNTER — Other Ambulatory Visit: Payer: Self-pay

## 2017-09-14 ENCOUNTER — Emergency Department (HOSPITAL_BASED_OUTPATIENT_CLINIC_OR_DEPARTMENT_OTHER): Payer: Medicare Other

## 2017-09-14 ENCOUNTER — Emergency Department (HOSPITAL_BASED_OUTPATIENT_CLINIC_OR_DEPARTMENT_OTHER)
Admission: EM | Admit: 2017-09-14 | Discharge: 2017-09-14 | Disposition: A | Discharge status: Home / Self Care | Payer: Medicare Other | Attending: Emergency Medicine | Admitting: Emergency Medicine

## 2017-09-14 ENCOUNTER — Encounter (HOSPITAL_BASED_OUTPATIENT_CLINIC_OR_DEPARTMENT_OTHER): Payer: Self-pay

## 2017-09-14 DIAGNOSIS — I2510 Atherosclerotic heart disease of native coronary artery without angina pectoris: Secondary | ICD-10-CM | POA: Insufficient documentation

## 2017-09-14 DIAGNOSIS — R070 Pain in throat: Secondary | ICD-10-CM | POA: Diagnosis present

## 2017-09-14 DIAGNOSIS — R05 Cough: Secondary | ICD-10-CM | POA: Diagnosis not present

## 2017-09-14 DIAGNOSIS — Z7982 Long term (current) use of aspirin: Secondary | ICD-10-CM | POA: Insufficient documentation

## 2017-09-14 DIAGNOSIS — Z79899 Other long term (current) drug therapy: Secondary | ICD-10-CM | POA: Diagnosis not present

## 2017-09-14 DIAGNOSIS — I10 Essential (primary) hypertension: Secondary | ICD-10-CM | POA: Diagnosis not present

## 2017-09-14 DIAGNOSIS — J029 Acute pharyngitis, unspecified: Secondary | ICD-10-CM | POA: Diagnosis not present

## 2017-09-14 MED ORDER — AZITHROMYCIN 250 MG PO TABS: ORAL_TABLET | ORAL | 0 refills | Status: DC

## 2017-09-14 MED ORDER — PREDNISONE 10 MG PO TABS: 20.0000 mg | ORAL_TABLET | Freq: Two times a day (BID) | ORAL | 0 refills | Status: DC

## 2017-09-14 NOTE — Discharge Instructions (Signed)
Zithromax as prescribed.  Prednisone as prescribed.  Follow-up with ENT if not improving in the next 3-4 days.  The contact information for Dr. Benjamine Mola has been provided in this discharge summary for you to call and make these arrangements.  Return to the ER in the meantime if symptoms significantly worsen or change.

## 2017-09-14 NOTE — ED Notes (Signed)
Pt verbalizes understanding of d/c instructions and denies any further need at this time. 

## 2017-09-14 NOTE — ED Triage Notes (Signed)
Pt feels like there is something stuck in his throat and he cannot cough it up for the last day.  He does not recall anything getting stuck, states his voice has changed because of it.  He is able to eat a drink without issue and is in no respiratory distress.

## 2017-09-14 NOTE — ED Provider Notes (Signed)
[place] [facility] EMERGENCY DEPARTMENT Provider Note   CSN: [id number] Arrival date & time: 09/14/17  0410     History   Chief Complaint Chief Complaint  Patient presents with  . Sore Throat    HPI Noah Scott is a 81 y.o. male.  Patient is a 81 year old male with past medical history of coronary artery disease, hypertension presenting for evaluation of sore throat.  He reports that yesterday he felt as though he might be getting a cold, then his throat became scratchy.  He feels as though there is something lining the inside of his throat.  He reports his voice seems to be changing.  He denies any difficulty eating or drinking.  He feels as though something is stuck in his neck.   The history is provided by the patient.  Sore Throat  This is a new problem. The current episode started yesterday. The problem occurs constantly. The problem has been gradually worsening. Pertinent negatives include no chest pain and no shortness of breath. The symptoms are aggravated by swallowing. Nothing relieves the symptoms. He has tried nothing for the symptoms. The treatment provided no relief.    Past Medical History:  Diagnosis Date  . CAD (coronary artery disease)   . HTN (hypertension)   . Hyperlipemia     There are no active problems to display for this patient.   Past Surgical History:  Procedure Laterality Date  . CORONARY ANGIOPLASTY WITH STENT PLACEMENT         Home Medications    Prior to Admission medications   Medication Sig Start Date End Date Taking? Authorizing Provider  aspirin EC 81 MG tablet Take 81 mg by mouth daily.    [provider]  doxazosin (CARDURA) 8 MG tablet Take 8 mg by mouth daily.    [provider]  ezetimibe (ZETIA) 10 MG tablet Take 10 mg by mouth daily.    [provider]  furosemide (LASIX) 20 MG tablet Take 20 mg by mouth.    [provider]  losartan (COZAAR) 25 MG tablet Take 25 mg by mouth daily.     [provider]  naproxen (NAPROSYN) 500 MG tablet Take 500 mg by mouth 2 (two) times daily with a meal.    [provider]  ondansetron (ZOFRAN ODT) 4 MG disintegrating tablet 4mg  ODT q4 hours prn nausea/vomit 07/03/17   Deno Etienne, DO  potassium chloride SA (K-DUR,KLOR-CON) 20 MEQ tablet Take 20 mEq by mouth 2 (two) times daily.    [provider]  rosuvastatin (CRESTOR) 20 MG tablet Take 20 mg by mouth daily.    [provider]  trimethoprim-polymyxin b (POLYTRIM) ophthalmic solution Place 2 drops into both eyes every 6 (six) hours for 7 days. 09/07/17 09/14/17  [facility], MD    Family History No family history on file.  Social History Social History   Tobacco Use  . Smoking status: Never Smoker  . Smokeless tobacco: Never Used  Substance Use Topics  . Alcohol use: No  . Drug use: No     Allergies   Patient has no known allergies.   Review of Systems Review of Systems  Respiratory: Negative for shortness of breath.   Cardiovascular: Negative for chest pain.  All other systems reviewed and are negative.    Physical Exam Updated Vital Signs BP (!) 153/89 (BP Location: Right Arm)   Pulse 72   Temp 98.3 F (36.8 C) (Oral)   Resp 18  Ht 5\' 10"  (1.778 m)   Wt 108.9 kg (240 lb)   SpO2 100%   BMI 34.44 kg/m   Physical Exam  Constitutional: He is oriented to person, place, and time. He appears well-developed and well-nourished. No distress.  HENT:  Head: Normocephalic and atraumatic.  Mouth/Throat: Uvula is midline, oropharynx is clear and moist and mucous membranes are normal. Mucous membranes are not pale and not dry. No oral lesions. No uvula swelling. No oropharyngeal exudate, posterior oropharyngeal edema, posterior oropharyngeal erythema or tonsillar abscesses.  Esther oropharynx is normal.  I appreciate no abnormality.  There is no swelling or adenopathy of the neck.  Neck: Normal range of motion. Neck supple.    Cardiovascular: Normal rate and regular rhythm. Exam reveals no friction rub.  No murmur heard. Pulmonary/Chest: Effort normal and breath sounds normal. No respiratory distress. He has no wheezes. He has no rales.  Abdominal: Soft. Bowel sounds are normal. He exhibits no distension. There is no tenderness.  Musculoskeletal: Normal range of motion. He exhibits no edema.  Neurological: He is alert and oriented to person, place, and time. Coordination normal.  Skin: Skin is warm and dry. He is not diaphoretic.  Nursing note and vitals reviewed.    ED Treatments / Results  Labs (all labs ordered are listed, but only abnormal results are displayed) Labs Reviewed - No data to display  EKG  EKG Interpretation None       Radiology No results found.  Procedures Procedures (including critical care time)  Medications Ordered in ED Medications - No data to display   Initial Impression / Assessment and Plan / ED Course  I have reviewed the triage vital signs and the nursing notes.  Pertinent labs & imaging results that were available during my care of the patient were reviewed by me and considered in my medical decision making (see chart for details).  Soft tissue neck films reveal no swelling of the epiglottis, foreign body, or obvious airway narrowing.  I see no indication at this time for further workup.  He is breathing, eating, drinking, and speaking without difficulty.  He is concerned he has some sort of infection that may cause his airway to close.  He will be prescribed antibiotics and steroids and is to follow-up with ear nose and throat if not improving.  Final Clinical Impressions(s) / ED Diagnoses   Final diagnoses:  None    ED Discharge Orders    None       Veryl Speak, MD 09/14/17 [phone]

## 2017-10-10 DIAGNOSIS — H01001 Unspecified blepharitis right upper eyelid: Secondary | ICD-10-CM | POA: Diagnosis not present

## 2017-10-10 DIAGNOSIS — B301 Conjunctivitis due to adenovirus: Secondary | ICD-10-CM | POA: Diagnosis not present

## 2017-10-10 DIAGNOSIS — H2513 Age-related nuclear cataract, bilateral: Secondary | ICD-10-CM | POA: Diagnosis not present

## 2017-10-10 DIAGNOSIS — H01002 Unspecified blepharitis right lower eyelid: Secondary | ICD-10-CM | POA: Diagnosis not present

## 2017-11-21 ENCOUNTER — Emergency Department (HOSPITAL_BASED_OUTPATIENT_CLINIC_OR_DEPARTMENT_OTHER): Payer: Medicare Other

## 2017-11-21 ENCOUNTER — Inpatient Hospital Stay (HOSPITAL_BASED_OUTPATIENT_CLINIC_OR_DEPARTMENT_OTHER)
Admission: EM | Admit: 2017-11-21 | Discharge: 2017-11-26 | DRG: 247 | Disposition: A | Discharge status: Home / Self Care | Payer: Medicare Other | Attending: Cardiovascular Disease | Admitting: Cardiovascular Disease

## 2017-11-21 ENCOUNTER — Other Ambulatory Visit: Payer: Self-pay

## 2017-11-21 ENCOUNTER — Encounter (HOSPITAL_BASED_OUTPATIENT_CLINIC_OR_DEPARTMENT_OTHER): Payer: Self-pay | Admitting: Emergency Medicine

## 2017-11-21 DIAGNOSIS — I2510 Atherosclerotic heart disease of native coronary artery without angina pectoris: Secondary | ICD-10-CM | POA: Diagnosis not present

## 2017-11-21 DIAGNOSIS — Z955 Presence of coronary angioplasty implant and graft: Secondary | ICD-10-CM | POA: Diagnosis not present

## 2017-11-21 DIAGNOSIS — E785 Hyperlipidemia, unspecified: Secondary | ICD-10-CM | POA: Diagnosis present

## 2017-11-21 DIAGNOSIS — Z79899 Other long term (current) drug therapy: Secondary | ICD-10-CM

## 2017-11-21 DIAGNOSIS — Z7982 Long term (current) use of aspirin: Secondary | ICD-10-CM | POA: Diagnosis not present

## 2017-11-21 DIAGNOSIS — Z791 Long term (current) use of non-steroidal anti-inflammatories (NSAID): Secondary | ICD-10-CM

## 2017-11-21 DIAGNOSIS — Z7952 Long term (current) use of systemic steroids: Secondary | ICD-10-CM

## 2017-11-21 DIAGNOSIS — Z87891 Personal history of nicotine dependence: Secondary | ICD-10-CM | POA: Diagnosis not present

## 2017-11-21 DIAGNOSIS — Z8249 Family history of ischemic heart disease and other diseases of the circulatory system: Secondary | ICD-10-CM | POA: Diagnosis not present

## 2017-11-21 DIAGNOSIS — R748 Abnormal levels of other serum enzymes: Secondary | ICD-10-CM | POA: Diagnosis not present

## 2017-11-21 DIAGNOSIS — E2749 Other adrenocortical insufficiency: Secondary | ICD-10-CM

## 2017-11-21 DIAGNOSIS — K219 Gastro-esophageal reflux disease without esophagitis: Secondary | ICD-10-CM | POA: Diagnosis present

## 2017-11-21 DIAGNOSIS — Z6833 Body mass index (BMI) 33.0-33.9, adult: Secondary | ICD-10-CM

## 2017-11-21 DIAGNOSIS — E669 Obesity, unspecified: Secondary | ICD-10-CM | POA: Diagnosis present

## 2017-11-21 DIAGNOSIS — I10 Essential (primary) hypertension: Secondary | ICD-10-CM

## 2017-11-21 DIAGNOSIS — M1990 Unspecified osteoarthritis, unspecified site: Secondary | ICD-10-CM | POA: Diagnosis present

## 2017-11-21 DIAGNOSIS — E782 Mixed hyperlipidemia: Secondary | ICD-10-CM | POA: Diagnosis not present

## 2017-11-21 DIAGNOSIS — E7800 Pure hypercholesterolemia, unspecified: Secondary | ICD-10-CM | POA: Diagnosis not present

## 2017-11-21 DIAGNOSIS — I214 Non-ST elevation (NSTEMI) myocardial infarction: Secondary | ICD-10-CM | POA: Diagnosis not present

## 2017-11-21 DIAGNOSIS — I5030 Unspecified diastolic (congestive) heart failure: Secondary | ICD-10-CM | POA: Diagnosis not present

## 2017-11-21 DIAGNOSIS — R0789 Other chest pain: Secondary | ICD-10-CM | POA: Diagnosis not present

## 2017-11-21 DIAGNOSIS — R079 Chest pain, unspecified: Secondary | ICD-10-CM | POA: Diagnosis not present

## 2017-11-21 DIAGNOSIS — I2583 Coronary atherosclerosis due to lipid rich plaque: Secondary | ICD-10-CM | POA: Diagnosis not present

## 2017-11-21 HISTORY — DX: Essential (primary) hypertension: I10

## 2017-11-21 HISTORY — DX: Non-ST elevation (NSTEMI) myocardial infarction: I21.4

## 2017-11-21 HISTORY — DX: Hyperlipidemia, unspecified: E78.5

## 2017-11-21 LAB — TROPONIN I
Troponin I: 0.03 ng/mL
Troponin I: 0.07 ng/mL
Troponin I: 0.07 ng/mL (ref <0.03)

## 2017-11-21 LAB — CBC WITH DIFFERENTIAL/PLATELET
Basophils Absolute: 0 K/uL (ref 0.0–0.1)
Basophils Relative: 1 %
Eosinophils Absolute: 0.2 K/uL (ref 0.0–0.7)
Eosinophils Relative: 3 %
HCT: 41.6 % (ref 39.0–52.0)
Hemoglobin: 14.2 g/dL (ref 13.0–17.0)
Lymphocytes Relative: 25 %
Lymphs Abs: 1.6 K/uL (ref 0.7–4.0)
MCH: 30.8 pg (ref 26.0–34.0)
MCHC: 34.1 g/dL (ref 30.0–36.0)
MCV: 90.2 fL (ref 78.0–100.0)
Monocytes Absolute: 0.8 K/uL (ref 0.1–1.0)
Monocytes Relative: 12 %
Neutro Abs: 3.8 K/uL (ref 1.7–7.7)
Neutrophils Relative %: 59 %
Platelets: 177 K/uL (ref 150–400)
RBC: 4.61 MIL/uL (ref 4.22–5.81)
RDW: 13.4 % (ref 11.5–15.5)
WBC: 6.3 K/uL (ref 4.0–10.5)

## 2017-11-21 LAB — COMPREHENSIVE METABOLIC PANEL WITH GFR
ALT: 23 U/L (ref 17–63)
AST: 26 U/L (ref 15–41)
Albumin: 4 g/dL (ref 3.5–5.0)
Alkaline Phosphatase: 64 U/L (ref 38–126)
Anion gap: 5 (ref 5–15)
BUN: 17 mg/dL (ref 6–20)
CO2: 28 mmol/L (ref 22–32)
Calcium: 8.9 mg/dL (ref 8.9–10.3)
Chloride: 106 mmol/L (ref 101–111)
Creatinine, Ser: 1.11 mg/dL (ref 0.61–1.24)
GFR calc Af Amer: >60 mL/min
GFR calc non Af Amer: >60 mL/min
Glucose, Bld: 119 mg/dL — ABNORMAL HIGH (ref 65–99)
Potassium: 4.3 mmol/L (ref 3.5–5.1)
Sodium: 139 mmol/L (ref 135–145)
Total Bilirubin: 0.5 mg/dL (ref 0.3–1.2)
Total Protein: 6.8 g/dL (ref 6.5–8.1)

## 2017-11-21 LAB — HEPARIN LEVEL (UNFRACTIONATED): HEPARIN UNFRACTIONATED: 0.34 [IU]/mL (ref 0.30–0.70)

## 2017-11-21 MED ORDER — SODIUM CHLORIDE 0.9% FLUSH
3.0000 mL | INTRAVENOUS | Status: DC | PRN
  Administered 2017-11-23: 3 mL via INTRAVENOUS
  Filled 2017-11-21: qty 3

## 2017-11-21 MED ORDER — POTASSIUM CHLORIDE CRYS ER 20 MEQ PO TBCR
20.0000 meq | EXTENDED_RELEASE_TABLET | Freq: Two times a day (BID) | ORAL | Status: DC
  Administered 2017-11-21 – 2017-11-26 (×11): 20 meq via ORAL
  Filled 2017-11-21 (×11): qty 1

## 2017-11-21 MED ORDER — ROSUVASTATIN CALCIUM 20 MG PO TABS
20.0000 mg | ORAL_TABLET | Freq: Every day | ORAL | Status: DC
  Administered 2017-11-21 – 2017-11-25 (×5): 20 mg via ORAL
  Filled 2017-11-21 (×3): qty 2
  Filled 2017-11-21 (×2): qty 1
  Filled 2017-11-21: qty 2

## 2017-11-21 MED ORDER — EZETIMIBE 10 MG PO TABS
10.0000 mg | ORAL_TABLET | Freq: Every day | ORAL | Status: DC
  Administered 2017-11-21 – 2017-11-26 (×6): 10 mg via ORAL
  Filled 2017-11-21 (×7): qty 1

## 2017-11-21 MED ORDER — FUROSEMIDE 20 MG PO TABS
20.0000 mg | ORAL_TABLET | Freq: Every day | ORAL | Status: DC
  Administered 2017-11-21 – 2017-11-23 (×3): 20 mg via ORAL
  Filled 2017-11-21 (×4): qty 1

## 2017-11-21 MED ORDER — ASPIRIN 81 MG PO CHEW
81.0000 mg | CHEWABLE_TABLET | ORAL | Status: AC
  Administered 2017-11-22: 81 mg via ORAL
  Filled 2017-11-21: qty 1

## 2017-11-21 MED ORDER — DOXAZOSIN MESYLATE 8 MG PO TABS
8.0000 mg | ORAL_TABLET | Freq: Every day | ORAL | Status: DC
  Administered 2017-11-21 – 2017-11-26 (×5): 8 mg via ORAL
  Filled 2017-11-21: qty 1
  Filled 2017-11-21 (×3): qty 4
  Filled 2017-11-21: qty 1
  Filled 2017-11-21: qty 4

## 2017-11-21 MED ORDER — SODIUM CHLORIDE 0.9% FLUSH
3.0000 mL | Freq: Two times a day (BID) | INTRAVENOUS | Status: DC
  Administered 2017-11-23 – 2017-11-24 (×2): 3 mL via INTRAVENOUS

## 2017-11-21 MED ORDER — SODIUM CHLORIDE 0.9 % WEIGHT BASED INFUSION
3.0000 mL/kg/h | INTRAVENOUS | Status: AC
  Administered 2017-11-22: 3 mL/kg/h via INTRAVENOUS

## 2017-11-21 MED ORDER — PANTOPRAZOLE SODIUM 40 MG IV SOLR
40.0000 mg | Freq: Once | INTRAVENOUS | Status: AC
  Administered 2017-11-21: 40 mg via INTRAVENOUS
  Filled 2017-11-21: qty 40

## 2017-11-21 MED ORDER — SODIUM CHLORIDE 0.9 % WEIGHT BASED INFUSION: 1.0000 mL/kg/h | INTRAVENOUS | Status: DC

## 2017-11-21 MED ORDER — LOSARTAN POTASSIUM 50 MG PO TABS
25.0000 mg | ORAL_TABLET | Freq: Every day | ORAL | Status: DC
  Administered 2017-11-21 – 2017-11-25 (×5): 25 mg via ORAL
  Filled 2017-11-21 (×7): qty 1

## 2017-11-21 MED ORDER — SODIUM CHLORIDE 0.9 % IV SOLN: 250.0000 mL | INTRAVENOUS | Status: DC | PRN

## 2017-11-21 MED ORDER — HEPARIN (PORCINE) IN NACL 100-0.45 UNIT/ML-% IJ SOLN
1300.0000 [IU]/h | INTRAMUSCULAR | Status: DC
  Administered 2017-11-21: 1250 [IU]/h via INTRAVENOUS
  Administered 2017-11-21 – 2017-11-25 (×4): 1300 [IU]/h via INTRAVENOUS
  Filled 2017-11-21 (×6): qty 250

## 2017-11-21 MED ORDER — GI COCKTAIL ~~LOC~~
30.0000 mL | Freq: Once | ORAL | Status: AC
  Administered 2017-11-21: 30 mL via ORAL
  Filled 2017-11-21: qty 30

## 2017-11-21 MED ORDER — ONDANSETRON HCL 4 MG/2ML IJ SOLN
4.0000 mg | Freq: Once | INTRAMUSCULAR | Status: AC
  Administered 2017-11-21: 4 mg via INTRAVENOUS
  Filled 2017-11-21: qty 2

## 2017-11-21 MED ORDER — HEPARIN BOLUS VIA INFUSION
4000.0000 [IU] | Freq: Once | INTRAVENOUS | Status: AC
  Administered 2017-11-21: 4000 [IU] via INTRAVENOUS

## 2017-11-21 MED ORDER — ASPIRIN EC 81 MG PO TBEC
81.0000 mg | DELAYED_RELEASE_TABLET | Freq: Every day | ORAL | Status: DC
  Administered 2017-11-22 – 2017-11-26 (×4): 81 mg via ORAL
  Filled 2017-11-21 (×4): qty 1

## 2017-11-21 MED ORDER — ASPIRIN 81 MG PO CHEW
324.0000 mg | CHEWABLE_TABLET | Freq: Once | ORAL | Status: AC
  Administered 2017-11-21: 324 mg via ORAL
  Filled 2017-11-21: qty 4

## 2017-11-21 NOTE — Progress Notes (Signed)
ANTICOAGULATION CONSULT NOTE - Initial Consult  Pharmacy Consult for heparin Indication: chest pain/ACS  No Known Allergies  Patient Measurements: Height: 5\' 10"  (177.8 cm) Weight: 235 lb (106.6 kg) IBW/kg (Calculated) : 73 Heparin Dosing Weight: 96 kg  Vital Signs: Temp: 97.8 F (36.6 C) (03/07 0141) Temp Source: Oral (03/07 0141) BP: 161/86 (03/07 0310) Pulse Rate: 60 (03/07 0310)  Labs: Recent Labs    11/21/17 0237 11/21/17 0532  HGB 14.2  --   HCT 41.6  --   PLT 177  --   CREATININE 1.11  --   TROPONINI 0.03* 0.07*    Estimated Creatinine Clearance: 65.9 mL/min (by C-G formula based on SCr of 1.11 mg/dL).   Medical History: Past Medical History:  Diagnosis Date  . CAD (coronary artery disease)   . HTN (hypertension)   . Hyperlipemia      Assessment: C/o burning sensation in chest, trop 0.07, starting heparin gtt for rule out ACS. CBC ok, SCr 1.1, no anticoagulants prior to admission.  Goal of Therapy:  Heparin level 0.3-0.7 units/ml Monitor platelets by anticoagulation protocol: Yes   Plan:  -Heparin 4000 units x1 then 1250 units/hr -Daily HL, CBC -Level in 8 hours   Harvel Quale 11/21/2017,7:13 AM

## 2017-11-21 NOTE — Discharge Instructions (Addendum)
PLEASE REMEMBER TO BRING ALL OF YOUR MEDICATIONS TO EACH OF YOUR FOLLOW-UP OFFICE VISITS. ° °PLEASE ATTEND ALL SCHEDULED FOLLOW-UP APPOINTMENTS.  ° °Activity: Increase activity slowly as tolerated. You may shower, but no soaking baths (or swimming) for 1 week. No driving for 1 week. No lifting over 5 lbs for 2 weeks. No sexual activity for 1 week.  ° °You May Return to Work: in 3 weeks (if applicable) ° °Wound Care: You may wash cath site gently with soap and water. Keep cath site clean and dry. If you notice pain, swelling, bleeding or pus at your cath site, please call [phone]. ° ° ° °Cardiac Cath Site Care °Refer to this sheet in the next few weeks. These instructions provide you with information on caring for yourself after your procedure. Your caregiver may also give you more specific instructions. Your treatment has been planned according to current medical practices, but problems sometimes occur. Call your caregiver if you have any problems or questions after your procedure. °HOME CARE INSTRUCTIONS °· You may shower 24 hours after the procedure. Remove the bandage (dressing) and gently wash the site with plain soap and water. Gently pat the site dry.  °· Do not apply powder or lotion to the site.  °· Do not sit in a bathtub, swimming pool, or whirlpool for 5 to 7 days.  °· No bending, squatting, or lifting anything over 10 pounds (4.5 kg) as directed by your caregiver.  °· Inspect the site at least twice daily.  °· Do not drive home if you are discharged the same day of the procedure. Have someone else drive you.  °· You may drive 24 hours after the procedure unless otherwise instructed by your caregiver.  °What to expect: °· Any bruising will usually fade within 1 to 2 weeks.  °· Blood that collects in the tissue (hematoma) may be painful to the touch. It should usually decrease in size and tenderness within 1 to 2 weeks.  °SEEK IMMEDIATE MEDICAL CARE IF: °· You have unusual pain at the site or down the  affected limb.  °· You have redness, warmth, swelling, or pain at the site.  °· You have drainage (other than a small amount of blood on the dressing).  °· You have chills.  °· You have a fever or persistent symptoms for more than 72 hours.  °· You have a fever and your symptoms suddenly get worse.  °· Your leg becomes pale, cool, tingly, or numb.  °· You have heavy bleeding from the site. Hold pressure on the site.  °Document Released: 10/06/2010 Document Revised: 08/23/2011 Document Reviewed:  ° °

## 2017-11-21 NOTE — ED Notes (Signed)
Attempted to call report to the floor at this time  - RN not able to come to the phone per Network engineer

## 2017-11-21 NOTE — ED Provider Notes (Signed)
TIME SEEN: 2:18 AM  CHIEF COMPLAINT: Heartburn  HPI: Patient is a 82 year old male with history of hypertension, hyperlipidemia CAD with a stent in 2001 in [place] who presents to the emergency department with 2 days of intermittent chest pain.  Describes it as an anterior pain that he feels like as a burning.  Not exertional or pleuritic.  It is worse after eating and worse with lying flat.  States he thinks it is heartburn but has never had heartburn before.  States he has never had a heart attack before.  States he went and bought Nexium and has been taking it without relief.  Last Nexium tablet was at 6 PM last night.  No tightness or pressure in the chest.  No radiation of pain.  No shortness of breath, nausea, vomiting, diaphoresis, dizziness, fevers, cough, lower extremity swelling or pain, abdominal pain.  Did not try any other medications at home.  Patient is a rather poor historian.  ROS: See HPI Constitutional: no fever  Eyes: no drainage  ENT: no runny nose   Cardiovascular:   + chest pain  Resp: no SOB  GI: no vomiting GU: no dysuria Integumentary: no rash  Allergy: no hives  Musculoskeletal: no leg swelling  Neurological: no slurred speech ROS otherwise negative  PAST MEDICAL HISTORY/PAST SURGICAL HISTORY:  Past Medical History:  Diagnosis Date  . CAD (coronary artery disease)   . HTN (hypertension)   . Hyperlipemia     MEDICATIONS:  Prior to Admission medications   Medication Sig Start Date End Date Taking? Authorizing Provider  aspirin EC 81 MG tablet Take 81 mg by mouth daily.    [provider]  azithromycin (ZITHROMAX Z-PAK) 250 MG tablet 2 po day one, then 1 daily x 4 days 09/14/17   Veryl Speak, MD  doxazosin (CARDURA) 8 MG tablet Take 8 mg by mouth daily.    [provider]  ezetimibe (ZETIA) 10 MG tablet Take 10 mg by mouth daily.    [provider]  furosemide (LASIX) 20 MG tablet Take 20 mg by mouth.    [provider]   losartan (COZAAR) 25 MG tablet Take 25 mg by mouth daily.    [provider]  naproxen (NAPROSYN) 500 MG tablet Take 500 mg by mouth 2 (two) times daily with a meal.    [provider]  ondansetron (ZOFRAN ODT) 4 MG disintegrating tablet 4mg  ODT q4 hours prn nausea/vomit 07/03/17   Deno Etienne, DO  potassium chloride SA (K-DUR,KLOR-CON) 20 MEQ tablet Take 20 mEq by mouth 2 (two) times daily.    [provider]  predniSONE (DELTASONE) 10 MG tablet Take 2 tablets (20 mg total) by mouth 2 (two) times daily. 09/14/17   Veryl Speak, MD  rosuvastatin (CRESTOR) 20 MG tablet Take 20 mg by mouth daily.    [provider]    ALLERGIES:  No Known Allergies  SOCIAL HISTORY:  Social History   Tobacco Use  . Smoking status: Never Smoker  . Smokeless tobacco: Never Used  Substance Use Topics  . Alcohol use: No    FAMILY HISTORY: No family history on file.  EXAM: BP (!) 178/99 (BP Location: Right Arm)   Pulse 63   Temp 97.8 F (36.6 C) (Oral)   Resp 18   Ht 5\' 10"  (1.778 m)   Wt 106.6 kg (235 lb)   SpO2 97%   BMI 33.72 kg/m  CONSTITUTIONAL: Alert and oriented and responds appropriately to questions. Well-appearing; well-nourished  HEAD: Normocephalic EYES: Conjunctivae clear, pupils appear equal, EOMI ENT: normal nose; moist mucous membranes NECK: Supple, no meningismus, no nuchal rigidity, no LAD  CARD: RRR; S1 and S2 appreciated; no murmurs, no clicks, no rubs, no gallops RESP: Normal chest excursion without splinting or tachypnea; breath sounds clear and equal bilaterally; no wheezes, no rhonchi, no rales, no hypoxia or respiratory distress, speaking full sentences ABD/GI: Normal bowel sounds; non-distended; soft, non-tender, no rebound, no guarding, no peritoneal signs, no hepatosplenomegaly BACK:  The back appears normal and is non-tender to palpation, there is no CVA tenderness EXT: Normal ROM in all joints; non-tender to palpation; no edema;  normal capillary refill; no cyanosis, no calf tenderness or swelling    SKIN: Normal color for age and race; warm; no rash NEURO: Moves all extremities equally PSYCH: The patient's mood and manner are appropriate. Grooming and personal hygiene are appropriate.  MEDICAL DECISION MAKING: Patient here with chest pain.  EKG shows inferior T wave abnormalities do not appear changed compared to previous.  There is no new ischemic abnormality.  Low suspicion for ACS but he does have risk factors and states he has never had heartburn before.  He is also a very poor historian.  Will obtain troponin, chest x-ray.  Abdominal exam is benign.  Will give GI cocktail and reassess.  Doubt PE or dissection.  ED PROGRESS: Patient's labs are unremarkable other than a troponin of 0.03 in the setting of normal creatinine.  Chest x-ray is clear.  No significant change in symptoms after GI cocktail.  We have discussed the slightly elevated troponin.  Have discussed admission but patient declines.  He is very reluctant to even stay for a second set of cardiac enzymes.  After lengthy discussion he agrees to second troponin at 5:30 AM.  If this is elevated he is aware that he will need to be admitted to the hospital.  Will give Protonix and Zofran for further symptomatic relief.  Patient is quite argumentative here.  I have spent quite a bit of time talking to him about what these cardiac enzymes mean and why I have ordered them.  I am not convinced that this is all GI in nature.  He is not the best historian and has multiple risk factors for ACS.   6:25 AM  Pt's second troponin is more elevated at 0.07.  I discussed with patient that this is concerning that he is having a heart attack.  I have recommended aspirin and IV heparin.  I have recommended transfer to [facility] via ambulance.  Patient continues to be argumentative stating that he does not feel that this is a heart attack.  He states that he is now asymptomatic  and wants to go home.  I have talked to patient at length that he can have severe and permanent disability and even death without immediate treatment for heart attack.  He states he understands these risks and is able to verbalize this back to me.  He states if he changes his mind he will go immediately to the closest emergency department.  We will give him a full dose aspirin here but unfortunately at this time the patient is oriented x3, appears to understand risk and benefits and I feel I cannot hold him against his will.  He does not appear intoxicated.  He appears to be making these decisions on his own volition.  He will sign out [place].  Nursing staff has also attempted to get  patient to stay for further treatment.   I have also called patient's daughter David Rodriquez at [phone] and left a message.   6:45 AM  Pt now agrees to be admitted to the hospital after further long discussion with patient's nurse.  Will start IV heparin.  Will discuss with cardiology on-call.  His PCP is with [facility].  It appears he is seeing Dr. Marijo File with cardiology in [place] in the past.  We have contacted [facility] but at this time they do not have any beds available and would not be able to accept the patient at this time.  Patient agrees with transport to [facility].  7:06 AM Discussed patient's case with cardiologist on call, Dr. Oval Linsey.  I have recommended admission and patient (and family if present) agree with this plan.  I will place admission orders to telemetry, inpatient per cardiologist request.  They are comfortable with telemetry given patient is hemodynamically stable and currently chest pain-free.  I have also been able to get in touch with patient's daughter who agrees with transport to [facility].  She appreciates that we were able to get patient to stay.  I reviewed all nursing notes, vitals, pertinent previous records, EKGs, lab and urine  results, imaging (as available).       EKG Interpretation  Date/Time:  Thursday November 21 2017 01:42:18 EST Ventricular Rate:  66 PR Interval:    QRS Duration: 107 QT Interval:  405 QTC Calculation: 425 R Axis:   -34 Text Interpretation:  Sinus rhythm Atrial premature complex Left axis deviation Abnormal R-wave progression, late transition Borderline T abnormalities, inferior leads No significant change since last tracing Confirmed by Pryor Curia [phone]) on 11/21/2017 1:48:15 AM         EKG Interpretation  Date/Time:  Thursday November 21 2017 06:58:59 EST Ventricular Rate:  78 PR Interval:    QRS Duration: 98 QT Interval:  391 QTC Calculation: 446 R Axis:   -59 Text Interpretation:  Sinus rhythm Left anterior fascicular block Abnormal R-wave progression, late transition Borderline T abnormalities, diffuse leads No significant change since last tracing Confirmed by Pryor Curia [phone]) on 11/21/2017 7:01:22 AM        CRITICAL CARE Performed by: Cyril Mourning Keatin Benham   Total critical care time: 55 minutes  Critical care time was exclusive of separately billable procedures and treating other patients.  Critical care was necessary to treat or prevent imminent or life-threatening deterioration.  Critical care was time spent personally by me on the following activities: development of treatment plan with patient and/or surrogate as well as nursing, discussions with consultants, evaluation of patient's response to treatment, examination of patient, obtaining history from patient or surrogate, ordering and performing treatments and interventions, ordering and review of laboratory studies, ordering and review of radiographic studies, pulse oximetry and re-evaluation of patient's condition.     Ralphael Southgate, Delice Bison, DO 11/21/17 [phone]

## 2017-11-21 NOTE — ED Notes (Signed)
Report given to Floor RN 

## 2017-11-21 NOTE — Progress Notes (Signed)
Patient arrived from [place]. Patient is alert and orientedX4. VS are stable. Patient complains of indigestion. B/P 141/94, HR 94 RR 19. Patient denies any pain. He arrived to the unit with IV in RAC and IV in L Wrist. IV's are clean dry and intact

## 2017-11-21 NOTE — Progress Notes (Signed)
  CRITICAL VALUE ALERT  Critical Value:  Troponin 0.07  Date & Time Notied:  11/21/2017 1742  Provider Notified: Janace Litten.  Orders Received/Actions taken: Provider has been notified and will take necessary action

## 2017-11-21 NOTE — Progress Notes (Signed)
ANTICOAGULATION CONSULT NOTE - Follow Up Consult  Pharmacy Consult for Heparin Indication: chest pain/ACS  No Known Allergies  Patient Measurements: Height: 5\' 10"  (177.8 cm) Weight: 235 lb (106.6 kg) IBW/kg (Calculated) : 73 Heparin Dosing Weight: 96 kg  Vital Signs: Temp: 98 F (36.7 C) (03/07 1615) Temp Source: Oral (03/07 1615) BP: 113/77 (03/07 1615) Pulse Rate: 95 (03/07 1615)  Labs: Recent Labs    11/21/17 0237 11/21/17 0532 11/21/17 1606  HGB 14.2  --   --   HCT 41.6  --   --   PLT 177  --   --   HEPARINUNFRC  --   --  0.34  CREATININE 1.11  --   --   TROPONINI 0.03* 0.07*  --     Estimated Creatinine Clearance: 65.9 mL/min (by C-G formula based on SCr of 1.11 mg/dL).   Medications:  Infusions:  . heparin 1,250 Units/hr (11/21/17 0729)    Assessment: 82 year old male on IV heparin for chest pain/ACS.   Initial heparin level is therapeutic at 0.34 after bolus of 4000 units and drip rate of 1250 units/hr. CBC is within normal limits. No bleeding noted. No issues with infusion noted.   Goal of Therapy:  Heparin level 0.3-0.7 units/ml Monitor platelets by anticoagulation protocol: Yes   Plan:  Increase Heparin drip to 1300 units/hr to keep in goal range.  Follow-up heparin level and CBC in AM.    Sloan Leiter, PharmD, BCPS, BCCCP Clinical Pharmacist Clinical phone 11/21/2017 until [id number] After hours, please call [phone] 11/21/2017,4:43 PM

## 2017-11-21 NOTE — ED Triage Notes (Signed)
Pt c/o burning sensation in upper chest x 2 days. Pt states yesterday pain would worsen when laying down and improve with sitting up. Pt states today the pain has not been relieved with sitting up and pt took nexium without relief.

## 2017-11-21 NOTE — ED Notes (Signed)
Patient transported to X-ray 

## 2017-11-21 NOTE — H&P (Addendum)
Cardiology Admission History and Physical:   Patient ID: Noah Scott; MRN: [id number]; DOB: Dec 24, 1935   Admission date: 11/21/2017  Primary Care Provider: [facility] Cardiologist: Clarene Critchley, MD- [place] one time   Chief Complaint:  Chest pain  Patient Profile:   Noah Scott is a 82 y.o. male with a history of CAD, HTN, HLD and arthritis.  History of Present Illness:   Noah Scott has I history of CAD with stent placement in 2001 in [place]. No history of MI or angina. He was seen as a new patient to establish cardiology care by Dr. Marijo File in [facility] 06/17/2017 but the patient says that was a one time thing and he is not established there.   Noah Scott says that the night before last he developed acid reflux with burning in his throat and mid chest. The occurred when he was laying down to sleep. He also has drainage down his throat and frequent throat clearing. He had no heartburn during the day. Last he again had burning while laying down for sleep. He got up to sit in chair and it got better. He laid down again and it got worse. He tried some Nexium but did not help.   He denies dyspnea, palpitations. Lightheadedness, othopnea, PND. He has mild lower extremity edema that he takes a diuretic for. Not worse lately. He is retired and does light activity around the house without any exertional chest discomfort or dyspnea. He feels that he is pretty active and uses resistance bands but no cardio exercise. About 2 weeks ago he did have some mild DOE when walking up a hill.   Pt was given GI cocktail at urgent care with no relief. He was given something through his IV which took the pain away and it has not returned.   He used to smoke remotely but none in 60 years. Has an occasional beer.   Troponins 0.03, 0.07 sinus rhythm with abnormal R wave progression and flattened T waves inferior and lateral  Normal chest xray.   Past Medical  History:  Diagnosis Date  . CAD (coronary artery disease)   . HTN (hypertension)   . Hyperlipemia     Past Surgical History:  Procedure Laterality Date  . CORONARY ANGIOPLASTY WITH STENT PLACEMENT       Medications Prior to Admission: Prior to Admission medications   Medication Sig Start Date End Date Taking? Authorizing Provider  aspirin EC 81 MG tablet Take 81 mg by mouth daily.    [provider]  doxazosin (CARDURA) 8 MG tablet Take 8 mg by mouth daily.    [provider]  ezetimibe (ZETIA) 10 MG tablet Take 10 mg by mouth daily.    [provider]  furosemide (LASIX) 20 MG tablet Take 20 mg by mouth.    [provider]  losartan (COZAAR) 25 MG tablet Take 25 mg by mouth daily.    [provider]  potassium chloride SA (K-DUR,KLOR-CON) 20 MEQ tablet Take 20 mEq by mouth 2 (two) times daily.    [provider]  rosuvastatin (CRESTOR) 20 MG tablet Take 20 mg by mouth daily.    [provider]     Allergies:   No Known Allergies  Social History:   Social History   Socioeconomic History  . Marital status: Widowed    Spouse name: Not on file  . Number of children: Not on file  . Years of education: Not on  file  . Highest education level: Not on file  Social Needs  . Financial resource strain: Not on file  . Food insecurity - worry: Not on file  . Food insecurity - inability: Not on file  . Transportation needs - medical: Not on file  . Transportation needs - non-medical: Not on file  Occupational History  . Not on file  Tobacco Use  . Smoking status: Never Smoker  . Smokeless tobacco: Never Used  Substance and Sexual Activity  . Alcohol use: No  . Drug use: No  . Sexual activity: Not on file  Other Topics Concern  . Not on file  Social History Narrative  . Not on file    Family History:   The patient's family history includes Heart disease in his father. He does not know the details, didn't have a  heart attack or heart surgery.  He is not aware of any other heart disease in his family. He does not seem to know the details of his family health history.   ROS:  Please see the history of present illness.  All other ROS reviewed and negative.     Physical Exam/Data:   Vitals:   11/21/17 0200 11/21/17 0310 11/21/17 0848 11/21/17 0850  BP: (!) 145/86 (!) 161/86 135/83   Pulse: (!) 59 60 81   Resp: 17 16 18 18   Temp:   98.4 F (36.9 C)   TempSrc:   Oral   SpO2: 100% 100% 100%   Weight:      Height:        Intake/Output Summary (Last 24 hours) at 11/21/2017 1423 Last data filed at 11/21/2017 1230 Gross per 24 hour  Intake 360 ml  Output -  Net 360 ml   Filed Weights   11/21/17 0138  Weight: 235 lb (106.6 kg)   Body mass index is 33.72 kg/m.  General:  Well nourished, well developed, in no acute distress HEENT: normal Lymph: no adenopathy Neck: no JVD Endocrine:  No thryomegaly Vascular: No carotid bruits; FA pulses 2+ bilaterally without bruits  Cardiac:  normal S1, S2; RRR; no murmur  Lungs:  clear to auscultation bilaterally, no wheezing, rhonchi or rales  Abd: soft, nontender, no hepatomegaly  Ext: Trace pretibial edema Musculoskeletal:  No deformities, BUE and BLE strength normal and equal Skin: warm and dry  Neuro:  CNs 2-12 intact, no focal abnormalities noted Psych:  Normal affect    EKG:  The ECG that was done was personally reviewed and demonstrates sinus rhythm with abnormal R wave progression and flattened T waves inferior and lateral  Relevant CV Studies:  None  Laboratory Data:  Chemistry Recent Labs  Lab 11/21/17 0237  NA 139  K 4.3  CL 106  CO2 28  GLUCOSE 119*  BUN 17  CREATININE 1.11  CALCIUM 8.9  GFRNONAA >60  GFRAA >60  ANIONGAP 5    Recent Labs  Lab 11/21/17 0237  PROT 6.8  ALBUMIN 4.0  AST 26  ALT 23  ALKPHOS 64  BILITOT 0.5   Hematology Recent Labs  Lab 11/21/17 0237  WBC 6.3  RBC 4.61  HGB 14.2  HCT 41.6    MCV 90.2  MCH 30.8  MCHC 34.1  RDW 13.4  PLT 177   Cardiac Enzymes Recent Labs  Lab 11/21/17 0237 11/21/17 0532  TROPONINI 0.03* 0.07*   No results for input(s): TROPIPOC in the last 168 hours.  BNPNo results for input(s): BNP, PROBNP in the last 168 hours.  DDimer No results for input(s): DDIMER in the last 168 hours.  Radiology/Studies:  Dg Chest 2 View  Result Date: 11/21/2017 CLINICAL DATA:  Burning sensation in the upper chest EXAM: CHEST - 2 VIEW COMPARISON:  07/03/2017 FINDINGS: Streaky atelectasis or scarring at the lingula. No acute consolidation or effusion. Normal heart size. Aortic atherosclerosis. No pneumothorax. Degenerative changes of the spine. IMPRESSION: No active cardiopulmonary disease. Electronically Signed   By: Donavan Foil M.D.   On: 11/21/2017 02:56    Assessment and Plan:   NSTEMI -Hx of CAD, HTN, HLD -Pt presented with heartburn that occurs while lying down at night only. No dyspnea. No chest pain during the day. Currently pain free.  -EKG with non-specific T changes. -Troponins 0.03, 0.07 -IV heparin infusing -Continue to trend troponins and monitor pt. If troponins rise will need cardiac cath to evaluate coronary arteries and previous stent. If stays flat or decreases, could consider nuclear stress test vs cath if worrisome symptoms. Will make NPO after midnight.   CAD s/p stent placement in 2001 in [place]. No history of MI or angina. On Aspirin 81 mg, statin and ARB. No BB.   Hypertension:  -Home meds inculed losartan 25 mg daily, lasix 20 mg daily. -BP initially high 178/99. Now improved. Continue current meds.   Hyperlipidemia: -LDL 52 in 05/15/17 on Crestor 20 mg daily and Zetia 10 mg daily. At goal <70. Continue current statin.   Severity of Illness: The appropriate patient status for this patient is OBSERVATION. Observation status is judged to be reasonable and necessary in order to provide the required intensity of service to ensure  the patient's safety. The patient's presenting symptoms, physical exam findings, and initial radiographic and laboratory data in the context of their medical condition is felt to place them at decreased risk for further clinical deterioration. Furthermore, it is anticipated that the patient will be medically stable for discharge from the hospital within 2 midnights of admission. The following factors support the patient status of observation.   " The patient's presenting symptoms include chest burning. " The physical exam findings include none. " The initial radiographic and laboratory data are mild change on EKG, elevated troponin.     For questions or updates, please contact [place] Please consult [email or web address] for contact info under Cardiology/STEMI.    Signed, Daune Perch, NP  11/21/2017 2:23 PM   Attending Note:   The patient was seen and examined.  Agree with assessment and plan as noted above.  Changes made to the above note as needed.  Patient seen and independently examined with Lewayne Bunting, NP .   We discussed all aspects of the encounter. I agree with the assessment and plan as stated above.  1.  Indigestion/question of chest pain: The patient presents with severe indigestion that he thought was due to gastroesophageal reflux.  He presented to the [facility] and was found to have elevated troponin levels.  It should be noted that he did not have any symptoms prior to stenting approximately 15-20 years ago when he lived on [place].  His last troponin level is 0.07.  If his subsequent troponin level is elevated, we will refer him for heart catheterization.  If the troponin level is negative we will proceed with stress testing.  2.  Chronic prednisone therapy.  The patient brought his medicine which included prednisone 20 mg a day.  He thinks that he is been taking this daily for 1 year but did  not know why.  I am very concerned that he may have adrenal  suppression with this dose.  I have asked him to call his daughter and verify that he indeed is still taking prednisone 20 mg a day.  If so, we will need to have him see an endocrinologist to see about coming off the prednisone.  I would not attempt that while in the hospital-especially if we perform PCI.  In fact, he may need stress dose steroids if he has to go for PCI and especially if he has to go for coronary artery bypass grafting.  3.  Hypertension: Continue current medications.  4.  Hyperlipidemia: Continue current Zetia and Crestor.   I have spent a total of 40 minutes with patient reviewing hospital  notes , telemetry, EKGs, labs and examining patient as well as establishing an assessment and plan that was discussed with the patient. > 50% of time was spent in direct patient care.   Thayer Headings, Brooke Bonito., MD, [facility] 11/21/2017, 2:57 PM [address]  [place] Pager [phone]

## 2017-11-22 ENCOUNTER — Inpatient Hospital Stay (HOSPITAL_COMMUNITY): Payer: Medicare Other

## 2017-11-22 ENCOUNTER — Encounter (HOSPITAL_COMMUNITY)
Admission: EM | Disposition: A | Discharge status: Home / Self Care | Payer: Self-pay | Source: Home / Self Care | Attending: Cardiovascular Disease

## 2017-11-22 DIAGNOSIS — R079 Chest pain, unspecified: Secondary | ICD-10-CM

## 2017-11-22 LAB — CBC
HCT: 41.2 % (ref 39.0–52.0)
Hemoglobin: 13.8 g/dL (ref 13.0–17.0)
MCH: 30.8 pg (ref 26.0–34.0)
MCHC: 33.5 g/dL (ref 30.0–36.0)
MCV: 92 fL (ref 78.0–100.0)
Platelets: 161 10*3/uL (ref 150–400)
RBC: 4.48 MIL/uL (ref 4.22–5.81)
RDW: 13.8 % (ref 11.5–15.5)
WBC: 5.3 10*3/uL (ref 4.0–10.5)

## 2017-11-22 LAB — BASIC METABOLIC PANEL
ANION GAP: 11 (ref 5–15)
BUN: 14 mg/dL (ref 6–20)
CALCIUM: 8.5 mg/dL — AB (ref 8.9–10.3)
CO2: 23 mmol/L (ref 22–32)
CREATININE: 0.99 mg/dL (ref 0.61–1.24)
Chloride: 105 mmol/L (ref 101–111)
GFR calc Af Amer: >60 mL/min (ref >60)
GFR calc non Af Amer: >60 mL/min (ref >60)
GLUCOSE: 118 mg/dL — AB (ref 65–99)
Potassium: 3.8 mmol/L (ref 3.5–5.1)
Sodium: 139 mmol/L (ref 135–145)

## 2017-11-22 LAB — HEPARIN LEVEL (UNFRACTIONATED): HEPARIN UNFRACTIONATED: 0.32 [IU]/mL (ref 0.30–0.70)

## 2017-11-22 LAB — NM MYOCAR MULTI W/SPECT W/WALL MOTION / EF
CSEPED: 5 min
CSEPEW: 1 METS
CSEPPHR: 111 {beats}/min
MPHR: 141 {beats}/min
Percent HR: 78 %
Rest HR: 75 {beats}/min

## 2017-11-22 LAB — PROTIME-INR
INR: 1.07
Prothrombin Time: 13.8 seconds (ref 11.4–15.2)

## 2017-11-22 LAB — TROPONIN I: Troponin I: 0.03 ng/mL (ref <0.03)

## 2017-11-22 SURGERY — LEFT HEART CATH AND CORONARY ANGIOGRAPHY
Anesthesia: LOCAL

## 2017-11-22 MED ORDER — ALUM & MAG HYDROXIDE-SIMETH 200-200-20 MG/5ML PO SUSP
30.0000 mL | Freq: Four times a day (QID) | ORAL | Status: DC | PRN
  Administered 2017-11-22 – 2017-11-24 (×3): 30 mL via ORAL
  Filled 2017-11-22 (×2): qty 30

## 2017-11-22 MED ORDER — FAMOTIDINE 20 MG PO TABS
20.0000 mg | ORAL_TABLET | Freq: Two times a day (BID) | ORAL | Status: DC
  Administered 2017-11-22 – 2017-11-25 (×7): 20 mg via ORAL
  Filled 2017-11-22 (×8): qty 1

## 2017-11-22 MED ORDER — REGADENOSON 0.4 MG/5ML IV SOLN
INTRAVENOUS | Status: AC
Start: 2017-11-22 — End: 2017-11-23
  Filled 2017-11-22: qty 5

## 2017-11-22 MED ORDER — TECHNETIUM TC 99M TETROFOSMIN IV KIT
10.0000 | PACK | Freq: Once | INTRAVENOUS | Status: AC | PRN
  Administered 2017-11-22: 10 via INTRAVENOUS

## 2017-11-22 MED ORDER — TECHNETIUM TC 99M TETROFOSMIN IV KIT
30.0000 | PACK | Freq: Once | INTRAVENOUS | Status: AC | PRN
  Administered 2017-11-22: 30 via INTRAVENOUS

## 2017-11-22 MED ORDER — REGADENOSON 0.4 MG/5ML IV SOLN
0.4000 mg | Freq: Once | INTRAVENOUS | Status: AC
  Administered 2017-11-22: 0.4 mg via INTRAVENOUS
  Filled 2017-11-22: qty 5

## 2017-11-22 MED ORDER — PANTOPRAZOLE SODIUM 40 MG PO TBEC
40.0000 mg | DELAYED_RELEASE_TABLET | Freq: Every day | ORAL | Status: DC
  Administered 2017-11-22 – 2017-11-26 (×5): 40 mg via ORAL
  Filled 2017-11-22 (×5): qty 1

## 2017-11-22 NOTE — Progress Notes (Signed)
ANTICOAGULATION CONSULT NOTE - Follow Up Consult  Pharmacy Consult for Heparin Indication: chest pain/ACS  No Known Allergies  Patient Measurements: Height: 5\' 10"  (177.8 cm) Weight: 235 lb (106.6 kg) IBW/kg (Calculated) : 73 Heparin Dosing Weight: 96 kg  Vital Signs: Temp: 97.7 F (36.5 C) (03/08 0600) Temp Source: Oral (03/08 0600) BP: 117/81 (03/08 0600) Pulse Rate: 64 (03/08 0600)  Labs: Recent Labs    11/21/17 0237 11/21/17 0532 11/21/17 1606 11/21/17 2309 11/22/17 0523 11/22/17 0645  HGB 14.2  --   --   --  13.8  --   HCT 41.6  --   --   --  41.2  --   PLT 177  --   --   --  161  --   LABPROT  --   --   --   --   --  13.8  INR  --   --   --   --   --  1.07  HEPARINUNFRC  --   --  0.34  --  0.32  --   CREATININE 1.11  --   --   --  0.99  --   TROPONINI 0.03* 0.07* 0.07* 0.03*  --   --     Estimated Creatinine Clearance: 73.9 mL/min (by C-G formula based on SCr of 0.99 mg/dL).   Medications:  Infusions:  . sodium chloride    . sodium chloride 1 mL/kg/hr (11/22/17 0643)  . heparin 1,300 Units/hr (11/21/17 2107)    Assessment: 82 year old male on IV heparin for chest pain/ACS.  Heparin level therapeutic  Goal of Therapy:  Heparin level 0.3-0.7 units/ml Monitor platelets by anticoagulation protocol: Yes   Plan:  Continue heparin at 1300 units / hr Daily heparin level, CBC  Thank you Anette Guarneri, PharmD [phone] I3/04/2018,10:01 AM

## 2017-11-22 NOTE — Progress Notes (Addendum)
Progress Note  Patient Name: Noah Scott Date of Encounter: 11/22/2017  Primary Cardiologist: Clarene Critchley, MD- [place] one time  Subjective   Still with complaints of reflux. Unable to lay down due to burning in his stomach/throat. Slept in the chair at bedside. Denies overt chest pain or SOB.   Inpatient Medications    Scheduled Meds: . aspirin EC  81 mg Oral Daily  . doxazosin  8 mg Oral Daily  . ezetimibe  10 mg Oral Daily  . famotidine  20 mg Oral BID  . furosemide  20 mg Oral Daily  . losartan  25 mg Oral Daily  . pantoprazole  40 mg Oral Daily  . potassium chloride SA  20 mEq Oral BID  . rosuvastatin  20 mg Oral Daily  . sodium chloride flush  3 mL Intravenous Q12H   Continuous Infusions: . sodium chloride    . sodium chloride 1 mL/kg/hr (11/22/17 0643)  . heparin 1,300 Units/hr (11/21/17 2107)   PRN Meds: sodium chloride, alum & mag hydroxide-simeth, sodium chloride flush   Vital Signs    Vitals:   11/21/17 1615 11/21/17 1618 11/21/17 1949 11/22/17 0600  BP: 113/77  112/69 117/81  Pulse: 95  76 64  Resp: (!) 28 17 16 16   Temp: 98 F (36.7 C)  98.5 F (36.9 C) 97.7 F (36.5 C)  TempSrc: Oral  Oral Oral  SpO2: 96%  98% 99%  Weight:      Height:        Intake/Output Summary (Last 24 hours) at 11/22/2017 1137 Last data filed at 11/22/2017 1100 Gross per 24 hour  Intake 895 ml  Output 200 ml  Net 695 ml   Filed Weights   11/21/17 0138  Weight: 235 lb (106.6 kg)    Telemetry    NSR, PVCs - Personally Reviewed  ECG    Sinus rhythm with PACs, LAD, TW flattening in V5-6 improved - Personally Reviewed  Physical Exam   GEN: Obese gentleman, sitting in bedside chair in no acute distress.   Neck: No JVD, no carotid bruits Cardiac: RRR, no murmurs, rubs, or gallops.  Respiratory: Clear to auscultation bilaterally, no wheezes/ rales/ rhonchi GI: NABS, Soft, obese, nontender, non-distended  MS: Trace b/l LE edema; No deformity. Neuro:   Nonfocal, moving all extremities spontaneously Psych: Normal affect   Labs    Chemistry Recent Labs  Lab 11/21/17 0237 11/22/17 0523  NA 139 139  K 4.3 3.8  CL 106 105  CO2 28 23  GLUCOSE 119* 118*  BUN 17 14  CREATININE 1.11 0.99  CALCIUM 8.9 8.5*  PROT 6.8  --   ALBUMIN 4.0  --   AST 26  --   ALT 23  --   ALKPHOS 64  --   BILITOT 0.5  --   GFRNONAA >60 >60  GFRAA >60 >60  ANIONGAP 5 11     Hematology Recent Labs  Lab 11/21/17 0237 11/22/17 0523  WBC 6.3 5.3  RBC 4.61 4.48  HGB 14.2 13.8  HCT 41.6 41.2  MCV 90.2 92.0  MCH 30.8 30.8  MCHC 34.1 33.5  RDW 13.4 13.8  PLT 177 161    Cardiac Enzymes Recent Labs  Lab 11/21/17 0237 11/21/17 0532 11/21/17 1606 11/21/17 2309  TROPONINI 0.03* 0.07* 0.07* 0.03*   No results for input(s): TROPIPOC in the last 168 hours.   BNPNo results for input(s): BNP, PROBNP in the last 168 hours.   DDimer No results for  input(s): DDIMER in the last 168 hours.   Radiology    Dg Chest 2 View  Result Date: 11/21/2017 CLINICAL DATA:  Burning sensation in the upper chest EXAM: CHEST - 2 VIEW COMPARISON:  07/03/2017 FINDINGS: Streaky atelectasis or scarring at the lingula. No acute consolidation or effusion. Normal heart size. Aortic atherosclerosis. No pneumothorax. Degenerative changes of the spine. IMPRESSION: No active cardiopulmonary disease. Electronically Signed   By: Donavan Foil M.D.   On: 11/21/2017 02:56    Cardiac Studies   None  Patient Profile   82 y.o. male with a history of CAD, HTN, HLD and arthritis who presents with complaints of reflux, found to have troponin elevation of 0.07. Admitted to cardiology for further work-up  Assessment & Plan    1. Elevated troponin, possible NSTEMI: patient presented with complaints of reflux. No overt CP, DOE, SOB. Troponin trend 0.03>0.07>0.07>0.03. EKG with non-specific T wave changes, no STE/D. Patient was started on a heparin gtt - Given downtrended troponin, will  obtain a NST today for further ischemic evaluation.  - If NST negative, anticipate he may be stable for discharge home today  2. CAD s/p PCI in 2001: cardiac cath done in [place]. Prior to PCI he did not have any chest pain but had an abnormal stress test.  - Continue ASA and statin  3. GERD: patient has had severe reflux for the past 3 days and has been unable to lay down to sleep at night due to burning in his chest/throat. Has taking OTC antacids prn over the past 3 days without significant relief. Received IV pantoprazole in the ED - Start pantoprazole 40mg  daily - Start famotidine 20mg  BID - Can consider outpatient GI follow-up.  4. HTN: BP improved and stable - Continue losartan, doxazosin, and lasix  5. HLD:  - Continue statin and zetia  For questions or updates, please contact [place] Please consult [email or web address] for contact info under Cardiology/STEMI.      Signed, Abigail Butts, PA-C  11/22/2017, 11:37 AM   [phone]  Attending Note:   The patient was seen and examined.  Agree with assessment and plan as noted above.  Changes made to the above note as needed.  Patient seen and independently examined with Roby Lofts, PA .   We discussed all aspects of the encounter. I agree with the assessment and plan as stated above.  1.   CAD :   Pt presented wth indigestion and + troponin levels myoview shows a large inferior defect with little to no redistribution. Intermediate risk.  We have scheduled him for cardiac cath on Monday  We have discussed the risks, benefits, options.   He understands and agrees to proceed.     I have spent a total of 40 minutes with patient reviewing hospital  notes , telemetry, EKGs, labs and examining patient as well as establishing an assessment and plan that was discussed with the patient. > 50% of time was spent in direct patient care.    Thayer Headings, Brooke Bonito., MD, [facility] 11/22/2017, 6:24 PM [address],  [place] Pager [phone]

## 2017-11-23 ENCOUNTER — Inpatient Hospital Stay (HOSPITAL_COMMUNITY): Payer: Medicare Other

## 2017-11-23 DIAGNOSIS — R748 Abnormal levels of other serum enzymes: Secondary | ICD-10-CM

## 2017-11-23 DIAGNOSIS — I5030 Unspecified diastolic (congestive) heart failure: Secondary | ICD-10-CM

## 2017-11-23 HISTORY — PX: TRANSTHORACIC ECHOCARDIOGRAM: SHX275

## 2017-11-23 LAB — ECHOCARDIOGRAM COMPLETE
HEIGHTINCHES: 70 in
WEIGHTICAEL: 3760 [oz_av]

## 2017-11-23 LAB — CBC
HCT: 39.8 % (ref 39.0–52.0)
Hemoglobin: 13.3 g/dL (ref 13.0–17.0)
MCH: 30.3 pg (ref 26.0–34.0)
MCHC: 33.4 g/dL (ref 30.0–36.0)
MCV: 90.7 fL (ref 78.0–100.0)
PLATELETS: 156 10*3/uL (ref 150–400)
RBC: 4.39 MIL/uL (ref 4.22–5.81)
RDW: 13.5 % (ref 11.5–15.5)
WBC: 5.5 10*3/uL (ref 4.0–10.5)

## 2017-11-23 LAB — BASIC METABOLIC PANEL
Anion gap: 10 (ref 5–15)
BUN: 15 mg/dL (ref 6–20)
CHLORIDE: 106 mmol/L (ref 101–111)
CO2: 25 mmol/L (ref 22–32)
CREATININE: 0.99 mg/dL (ref 0.61–1.24)
Calcium: 8.6 mg/dL — ABNORMAL LOW (ref 8.9–10.3)
Glucose, Bld: 106 mg/dL — ABNORMAL HIGH (ref 65–99)
POTASSIUM: 4 mmol/L (ref 3.5–5.1)
SODIUM: 141 mmol/L (ref 135–145)

## 2017-11-23 LAB — HEPARIN LEVEL (UNFRACTIONATED): HEPARIN UNFRACTIONATED: 0.34 [IU]/mL (ref 0.30–0.70)

## 2017-11-23 MED ORDER — METOPROLOL TARTRATE 12.5 MG HALF TABLET
12.5000 mg | ORAL_TABLET | Freq: Two times a day (BID) | ORAL | Status: DC
  Administered 2017-11-23 – 2017-11-26 (×7): 12.5 mg via ORAL
  Filled 2017-11-23 (×7): qty 1

## 2017-11-23 NOTE — Progress Notes (Signed)
Progress Note  Patient Name: Noah Scott Date of Encounter: 11/23/2017  Primary Cardiologist:Steven [place]beck, [place]    Subjective   No chest pain. Ongoing reflux  Inpatient Medications    Scheduled Meds: . aspirin EC  81 mg Oral Daily  . doxazosin  8 mg Oral Daily  . ezetimibe  10 mg Oral Daily  . famotidine  20 mg Oral BID  . furosemide  20 mg Oral Daily  . losartan  25 mg Oral Daily  . pantoprazole  40 mg Oral Daily  . potassium chloride SA  20 mEq Oral BID  . rosuvastatin  20 mg Oral Daily  . sodium chloride flush  3 mL Intravenous Q12H   Continuous Infusions: . sodium chloride    . sodium chloride 1 mL/kg/hr (11/22/17 0643)  . heparin 1,300 Units/hr (11/22/17 2127)   PRN Meds: sodium chloride, alum & mag hydroxide-simeth, sodium chloride flush   Vital Signs    Vitals:   11/22/17 1400 11/22/17 2004 11/22/17 2131 11/23/17 0552  BP: 125/73 101/80  125/78  Pulse: 98 73 68 71  Resp: 20 (!) 27 16 18   Temp: 98.2 F (36.8 C) 98.3 F (36.8 C)  98 F (36.7 C)  TempSrc: Oral Oral  Oral  SpO2: 98%  96% 98%  Weight:      Height:        Intake/Output Summary (Last 24 hours) at 11/23/2017 0824 Last data filed at 11/22/2017 1700 Gross per 24 hour  Intake 681 ml  Output 250 ml  Net 431 ml   Filed Weights   11/21/17 0138  Weight: 235 lb (106.6 kg)    Telemetry    SR - Personally Reviewed  ECG    n/a  Physical Exam   GEN: No acute distress.   Neck: No JVD Cardiac: RRR, 2/6 systolic murmur rusb, no rubs, or gallops.  Respiratory: Clear to auscultation bilaterally. GI: Soft, nontender, non-distended  MS: No edema; No deformity. Neuro:  Nonfocal  Psych: Normal affect   Labs    Chemistry Recent Labs  Lab 11/21/17 0237 11/22/17 0523 11/23/17 0310  NA 139 139 141  K 4.3 3.8 4.0  CL 106 105 106  CO2 28 23 25   GLUCOSE 119* 118* 106*  BUN 17 14 15   CREATININE 1.11 0.99 0.99  CALCIUM 8.9 8.5* 8.6*  PROT 6.8  --   --   ALBUMIN  4.0  --   --   AST 26  --   --   ALT 23  --   --   ALKPHOS 64  --   --   BILITOT 0.5  --   --   GFRNONAA >60 >60 >60  GFRAA >60 >60 >60  ANIONGAP 5 11 10      Hematology Recent Labs  Lab 11/21/17 0237 11/22/17 0523 11/23/17 0310  WBC 6.3 5.3 5.5  RBC 4.61 4.48 4.39  HGB 14.2 13.8 13.3  HCT 41.6 41.2 39.8  MCV 90.2 92.0 90.7  MCH 30.8 30.8 30.3  MCHC 34.1 33.5 33.4  RDW 13.4 13.8 13.5  PLT 177 161 156    Cardiac Enzymes Recent Labs  Lab 11/21/17 0237 11/21/17 0532 11/21/17 1606 11/21/17 2309  TROPONINI 0.03* 0.07* 0.07* 0.03*   No results for input(s): TROPIPOC in the last 168 hours.   BNPNo results for input(s): BNP, PROBNP in the last 168 hours.   DDimer No results for input(s): DDIMER in the last 168 hours.   Radiology  Nm Myocar Multi W/spect W/wall Motion / Ef  Result Date: 11/22/2017  Large defect in the inferior (base, mid, distal) and inferolateral (base, mid) that is partially reversible with improvement in the inferolateral region. LVEF is 51% with overall normal wall thickening. Changes noted above may reflect soft tissue attenuation (extensive GI uptake and diaphragm limits study), cannot exclude subendocardial scar and mild periinfart ischemia  LVEF 51% with normal wall thickening  Intermediate risk study. Extensive soft tissue activity limits study.     Cardiac Studies    Patient Profile     82 y.o.malewith a history of CAD, HTN, HLDand arthritis who presents with complaints of reflux, found to have troponin elevation of 0.07. Admitted to cardiology for further work-up    Assessment & Plan    1. Elevated troponin/History of CAD with prior stent - mild peak 0.07, EKG nonspecific changes.  - nuclear stress test 11/22/17 with inferior and inferolateral defect partially reversible, LVEF 51%. Intrermediate risk, possible some soft tissue attenuation.  - pain has been primarily GERD like in description  - given symptoms, troponin trend, and  nuclear findings plan for cath Monday.  - medical therapy with ASA, zetia 10, hep gtt, losartan 25, crestor 20. Start lopressor 12.5mg  bid.  - obtain echo   2. GERD - started on protonix 40mg  daily, pepcid 20mg  bid - consider outpatient GI eval   For questions or updates, please contact [facility] Please consult [email or web address] for contact info under Cardiology/STEMI.      Merrily Pew, MD  11/23/2017, 8:24 AM

## 2017-11-23 NOTE — Progress Notes (Signed)
  Echocardiogram 2D Echocardiogram has been performed.  Enas Winchel T Etoy Mcdonnell 11/23/2017, 2:09 PM

## 2017-11-23 NOTE — Plan of Care (Signed)
  Clinical Measurements: Cardiovascular complication will be avoided 11/23/2017 2009 - Progressing by Irish Lack, RN   Cardiovascular: Ability to achieve and maintain adequate cardiovascular perfusion will improve 11/23/2017 2009 - Progressing by Irish Lack, RN

## 2017-11-23 NOTE — Progress Notes (Signed)
ANTICOAGULATION CONSULT NOTE - Follow Up Consult  Pharmacy Consult for Heparin Indication: chest pain/ACS  No Known Allergies  Patient Measurements: Height: 5\' 10"  (177.8 cm) Weight: 235 lb (106.6 kg) IBW/kg (Calculated) : 73 Heparin Dosing Weight: 96 kg  Vital Signs: Temp: 98 F (36.7 C) (03/09 0552) Temp Source: Oral (03/09 0552) BP: 125/78 (03/09 0552) Pulse Rate: 71 (03/09 0552)  Labs: Recent Labs    11/21/17 0237 11/21/17 0532 11/21/17 1606 11/21/17 2309 11/22/17 0523 11/22/17 0645 11/23/17 0310  HGB 14.2  --   --   --  13.8  --  13.3  HCT 41.6  --   --   --  41.2  --  39.8  PLT 177  --   --   --  161  --  156  LABPROT  --   --   --   --   --  13.8  --   INR  --   --   --   --   --  1.07  --   HEPARINUNFRC  --   --  0.34  --  0.32  --  0.34  CREATININE 1.11  --   --   --  0.99  --  0.99  TROPONINI 0.03* 0.07* 0.07* 0.03*  --   --   --     Estimated Creatinine Clearance: 73.9 mL/min (by C-G formula based on SCr of 0.99 mg/dL).   Medications:  Infusions:  . sodium chloride    . sodium chloride 1 mL/kg/hr (11/22/17 0643)  . heparin 1,300 Units/hr (11/22/17 2127)    Assessment: 82 year old male on IV heparin for chest pain/ACS.  Heparin level therapeutic  Goal of Therapy:  Heparin level 0.3-0.7 units/ml Monitor platelets by anticoagulation protocol: Yes   Plan:  Continue heparin at 1300 units / hr Daily heparin level, CBC  Thank you Anette Guarneri, PharmD [phone] I3/05/2018,11:32 AM

## 2017-11-24 LAB — CBC
HCT: 39.4 % (ref 39.0–52.0)
HEMOGLOBIN: 13.3 g/dL (ref 13.0–17.0)
MCH: 30.6 pg (ref 26.0–34.0)
MCHC: 33.8 g/dL (ref 30.0–36.0)
MCV: 90.8 fL (ref 78.0–100.0)
Platelets: 161 10*3/uL (ref 150–400)
RBC: 4.34 MIL/uL (ref 4.22–5.81)
RDW: 13.5 % (ref 11.5–15.5)
WBC: 5.5 10*3/uL (ref 4.0–10.5)

## 2017-11-24 LAB — HEPARIN LEVEL (UNFRACTIONATED): HEPARIN UNFRACTIONATED: 0.31 [IU]/mL (ref 0.30–0.70)

## 2017-11-24 MED ORDER — SODIUM CHLORIDE 0.9% FLUSH: 3.0000 mL | INTRAVENOUS | Status: DC | PRN

## 2017-11-24 MED ORDER — SODIUM CHLORIDE 0.9 % WEIGHT BASED INFUSION: 3.0000 mL/kg/h | INTRAVENOUS | Status: DC

## 2017-11-24 MED ORDER — SODIUM CHLORIDE 0.9 % IV SOLN: 250.0000 mL | INTRAVENOUS | Status: DC | PRN

## 2017-11-24 MED ORDER — ASPIRIN 81 MG PO CHEW: 81.0000 mg | CHEWABLE_TABLET | ORAL | Status: DC

## 2017-11-24 MED ORDER — SODIUM CHLORIDE 0.9 % WEIGHT BASED INFUSION
1.0000 mL/kg/h | INTRAVENOUS | Status: DC
  Administered 2017-11-25: 1 mL/kg/h via INTRAVENOUS

## 2017-11-24 MED ORDER — SODIUM CHLORIDE 0.9% FLUSH
3.0000 mL | Freq: Two times a day (BID) | INTRAVENOUS | Status: DC
  Administered 2017-11-24 – 2017-11-25 (×2): 3 mL via INTRAVENOUS

## 2017-11-24 MED ORDER — SODIUM CHLORIDE 0.9% FLUSH: 3.0000 mL | Freq: Two times a day (BID) | INTRAVENOUS | Status: DC

## 2017-11-24 MED ORDER — SODIUM CHLORIDE 0.9 % WEIGHT BASED INFUSION: 1.0000 mL/kg/h | INTRAVENOUS | Status: DC

## 2017-11-24 MED ORDER — SODIUM CHLORIDE 0.9 % WEIGHT BASED INFUSION
3.0000 mL/kg/h | INTRAVENOUS | Status: DC
  Administered 2017-11-25: 3 mL/kg/h via INTRAVENOUS

## 2017-11-24 MED ORDER — ASPIRIN 81 MG PO CHEW
81.0000 mg | CHEWABLE_TABLET | ORAL | Status: AC
  Administered 2017-11-25: 81 mg via ORAL
  Filled 2017-11-24: qty 1

## 2017-11-24 NOTE — Progress Notes (Signed)
ANTICOAGULATION CONSULT NOTE - Follow Up Consult  Pharmacy Consult for Heparin Indication: chest pain/ACS  No Known Allergies  Patient Measurements: Height: 5\' 10"  (177.8 cm) Weight: 235 lb (106.6 kg) IBW/kg (Calculated) : 73 Heparin Dosing Weight: 96 kg  Vital Signs: Temp: 98.3 F (36.8 C) (03/10 0441) Temp Source: Oral (03/10 0441) BP: 117/81 (03/10 0900) Pulse Rate: 72 (03/10 0900)  Labs: Recent Labs    11/21/17 1606 11/21/17 2309  11/22/17 0523 11/22/17 0645 11/23/17 0310 11/24/17 0435  HGB  --   --    < > 13.8  --  13.3 13.3  HCT  --   --   --  41.2  --  39.8 39.4  PLT  --   --   --  161  --  156 161  LABPROT  --   --   --   --  13.8  --   --   INR  --   --   --   --  1.07  --   --   HEPARINUNFRC 0.34  --   --  0.32  --  0.34 0.31  CREATININE  --   --   --  0.99  --  0.99  --   TROPONINI 0.07* 0.03*  --   --   --   --   --    < > = values in this interval not displayed.    Estimated Creatinine Clearance: 73.9 mL/min (by C-G formula based on SCr of 0.99 mg/dL).    Assessment: 82 year old male on IV heparin for chest pain/ACS.  Heparin level therapeutic CBC stable  Goal of Therapy:  Heparin level 0.3-0.7 units/ml Monitor platelets by anticoagulation protocol: Yes   Plan:  Continue heparin at 1300 units / hr Daily heparin level, CBC  Thank you Anette Guarneri, PharmD [phone] I3/06/2018,11:27 AM

## 2017-11-24 NOTE — Progress Notes (Addendum)
Progress Note  Patient Name: Noah Scott Date of Encounter: 11/24/2017  Primary Cardiologist: [place]aren[place]    Subjective   Ongoing GERD like symptoms  Inpatient Medications    Scheduled Meds: . aspirin EC  81 mg Oral Daily  . doxazosin  8 mg Oral Daily  . ezetimibe  10 mg Oral Daily  . famotidine  20 mg Oral BID  . furosemide  20 mg Oral Daily  . losartan  25 mg Oral Daily  . metoprolol tartrate  12.5 mg Oral BID  . pantoprazole  40 mg Oral Daily  . potassium chloride SA  20 mEq Oral BID  . rosuvastatin  20 mg Oral Daily  . sodium chloride flush  3 mL Intravenous Q12H   Continuous Infusions: . sodium chloride    . sodium chloride 1 mL/kg/hr (11/22/17 0643)  . heparin 1,300 Units/hr (11/23/17 1637)   PRN Meds: sodium chloride, alum & mag hydroxide-simeth, sodium chloride flush   Vital Signs    Vitals:   11/23/17 2018 11/23/17 2104 11/24/17 0441 11/24/17 0900  BP: (!) 145/81 125/72 131/88 117/81  Pulse:  70  72  Resp: 18  20   Temp: 98.5 F (36.9 C)  98.3 F (36.8 C)   TempSrc: Tympanic  Oral   SpO2: 98%  98%   Weight:      Height:        Intake/Output Summary (Last 24 hours) at 11/24/2017 1030 Last data filed at 11/24/2017 0700 Gross per 24 hour  Intake 1002.22 ml  Output 1000 ml  Net 2.22 ml   Filed Weights   11/21/17 0138  Weight: 235 lb (106.6 kg)    Telemetry    SR - Personally Reviewed  ECG    n/a  Physical Exam   GEN: No acute distress.   Neck: No JVD Cardiac: RRR, no murmurs, rubs, or gallops.  Respiratory: Clear to auscultation bilaterally. GI: Soft, nontender, non-distended  MS: No edema; No deformity. Neuro:  Nonfocal  Psych: Normal affect   Labs    Chemistry Recent Labs  Lab 11/21/17 0237 11/22/17 0523 11/23/17 0310  NA 139 139 141  K 4.3 3.8 4.0  CL 106 105 106  CO2 28 23 25   GLUCOSE 119* 118* 106*  BUN 17 14 15   CREATININE 1.11 0.99 0.99  CALCIUM 8.9 8.5* 8.6*  PROT 6.8  --   --    ALBUMIN 4.0  --   --   AST 26  --   --   ALT 23  --   --   ALKPHOS 64  --   --   BILITOT 0.5  --   --   GFRNONAA >60 >60 >60  GFRAA >60 >60 >60  ANIONGAP 5 11 10      Hematology Recent Labs  Lab 11/22/17 0523 11/23/17 0310 11/24/17 0435  WBC 5.3 5.5 5.5  RBC 4.48 4.39 4.34  HGB 13.8 13.3 13.3  HCT 41.2 39.8 39.4  MCV 92.0 90.7 90.8  MCH 30.8 30.3 30.6  MCHC 33.5 33.4 33.8  RDW 13.8 13.5 13.5  PLT 161 156 161    Cardiac Enzymes Recent Labs  Lab 11/21/17 0237 11/21/17 0532 11/21/17 1606 11/21/17 2309  TROPONINI 0.03* 0.07* 0.07* 0.03*   No results for input(s): TROPIPOC in the last 168 hours.   BNPNo results for input(s): BNP, PROBNP in the last 168 hours.   DDimer No results for input(s): DDIMER in the last 168 hours.  Radiology    Nm Myocar Multi W/spect W/wall Motion / Ef  Result Date: 11/22/2017  Large defect in the inferior (base, mid, distal) and inferolateral (base, mid) that is partially reversible with improvement in the inferolateral region. LVEF is 51% with overall normal wall thickening. Changes noted above may reflect soft tissue attenuation (extensive GI uptake and diaphragm limits study), cannot exclude subendocardial scar and mild periinfart ischemia  LVEF 51% with normal wall thickening  Intermediate risk study. Extensive soft tissue activity limits study.     Cardiac Studies     Patient Profile     82 y.o. male 82 y.o.malewith a history of CAD, HTN, HLDand arthritiswho presents with complaints of reflux, found to have troponin elevation of 0.07. Admitted to cardiology for further work-up    Assessment & Plan    1. Elevated troponin/History of CAD with prior stent - mild peak 0.07, EKG nonspecific changes.  - nuclear stress test 11/22/17 with inferior and inferolateral defect partially reversible, LVEF 51%. Intrermediate risk, possible some soft tissue attenuation.  11/2017 echo LVE 60-65%, no WMAs, grade I diastolic dysfunction -  pain has been atypical and primarily GERD like in description  - given symptoms, troponin trend, and nuclear findings plan for cath Monday.  - medical therapy with ASA, zetia 10, hep gtt, losartan 25, crestor 20. Start lopressor 12.5mg  bid.   - we will plan for cath tomorrow     2. GERD - started on protonix 40mg  daily, pepcid 20mg  bid - consider outpatient GI eval. Could possibly be a very atypical form of angina, we will see with cath tomorrow.    Patient asked to hold diuretic today and tomorrow, urinating too much. We will hold lasix.    I have reviewed the risks, indications, and alternatives to cardiac catheterization, possible angioplasty, and stenting with the patient today. Risks include but are not limited to bleeding, infection, vascular injury, stroke, myocardial infection, arrhythmia, kidney injury, radiation-related injury in the case of prolonged fluoroscopy use, emergency cardiac surgery, and death. The patient understands the risks of serious complication is 1-2 in 2979 with diagnostic cardiac cath and 1-2% or less with angioplasty/stenting.  For questions or updates, please contact [place] Please consult [email or web address] for contact info under Cardiology/STEMI.      Merrily Pew, MD  11/24/2017, 10:30 AM

## 2017-11-24 NOTE — H&P (View-Only) (Signed)
Progress Note  Patient Name: Noah Scott Date of Encounter: 11/24/2017  Primary Cardiologist: Clarene [place]    Subjective   Ongoing GERD like symptoms  Inpatient Medications    Scheduled Meds: . aspirin EC  81 mg Oral Daily  . doxazosin  8 mg Oral Daily  . ezetimibe  10 mg Oral Daily  . famotidine  20 mg Oral BID  . furosemide  20 mg Oral Daily  . losartan  25 mg Oral Daily  . metoprolol tartrate  12.5 mg Oral BID  . pantoprazole  40 mg Oral Daily  . potassium chloride SA  20 mEq Oral BID  . rosuvastatin  20 mg Oral Daily  . sodium chloride flush  3 mL Intravenous Q12H   Continuous Infusions: . sodium chloride    . sodium chloride 1 mL/kg/hr (11/22/17 0643)  . heparin 1,300 Units/hr (11/23/17 1637)   PRN Meds: sodium chloride, alum & mag hydroxide-simeth, sodium chloride flush   Vital Signs    Vitals:   11/23/17 2018 11/23/17 2104 11/24/17 0441 11/24/17 0900  BP: (!) 145/81 125/72 131/88 117/81  Pulse:  70  72  Resp: 18  20   Temp: 98.5 F (36.9 C)  98.3 F (36.8 C)   TempSrc: Tympanic  Oral   SpO2: 98%  98%   Weight:      Height:        Intake/Output Summary (Last 24 hours) at 11/24/2017 1030 Last data filed at 11/24/2017 0700 Gross per 24 hour  Intake 1002.22 ml  Output 1000 ml  Net 2.22 ml   Filed Weights   11/21/17 0138  Weight: 235 lb (106.6 kg)    Telemetry    SR - Personally Reviewed  ECG    n/a  Physical Exam   GEN: No acute distress.   Neck: No JVD Cardiac: RRR, no murmurs, rubs, or gallops.  Respiratory: Clear to auscultation bilaterally. GI: Soft, nontender, non-distended  MS: No edema; No deformity. Neuro:  Nonfocal  Psych: Normal affect   Labs    Chemistry Recent Labs  Lab 11/21/17 0237 11/22/17 0523 11/23/17 0310  NA 139 139 141  K 4.3 3.8 4.0  CL 106 105 106  CO2 28 23 25   GLUCOSE 119* 118* 106*  BUN 17 14 15   CREATININE 1.11 0.99 0.99  CALCIUM 8.9 8.5* 8.6*  PROT 6.8  --   --    ALBUMIN 4.0  --   --   AST 26  --   --   ALT 23  --   --   ALKPHOS 64  --   --   BILITOT 0.5  --   --   GFRNONAA >60 >60 >60  GFRAA >60 >60 >60  ANIONGAP 5 11 10      Hematology Recent Labs  Lab 11/22/17 0523 11/23/17 0310 11/24/17 0435  WBC 5.3 5.5 5.5  RBC 4.48 4.39 4.34  HGB 13.8 13.3 13.3  HCT 41.2 39.8 39.4  MCV 92.0 90.7 90.8  MCH 30.8 30.3 30.6  MCHC 33.5 33.4 33.8  RDW 13.8 13.5 13.5  PLT 161 156 161    Cardiac Enzymes Recent Labs  Lab 11/21/17 0237 11/21/17 0532 11/21/17 1606 11/21/17 2309  TROPONINI 0.03* 0.07* 0.07* 0.03*   No results for input(s): TROPIPOC in the last 168 hours.   BNPNo results for input(s): BNP, PROBNP in the last 168 hours.   DDimer No results for input(s): DDIMER in the last 168 hours.  Radiology    Nm Myocar Multi W/spect W/wall Motion / Ef  Result Date: 11/22/2017  Large defect in the inferior (base, mid, distal) and inferolateral (base, mid) that is partially reversible with improvement in the inferolateral region. LVEF is 51% with overall normal wall thickening. Changes noted above may reflect soft tissue attenuation (extensive GI uptake and diaphragm limits study), cannot exclude subendocardial scar and mild periinfart ischemia  LVEF 51% with normal wall thickening  Intermediate risk study. Extensive soft tissue activity limits study.     Cardiac Studies     Patient Profile     82 y.o. male 82 y.o.malewith a history of CAD, HTN, HLDand arthritiswho presents with complaints of reflux, found to have troponin elevation of 0.07. Admitted to cardiology for further work-up    Assessment & Plan    1. Elevated troponin/History of CAD with prior stent - mild peak 0.07, EKG nonspecific changes.  - nuclear stress test 11/22/17 with inferior and inferolateral defect partially reversible, LVEF 51%. Intrermediate risk, possible some soft tissue attenuation.  11/2017 echo LVE 60-65%, no WMAs, grade I diastolic dysfunction -  pain has been atypical and primarily GERD like in description  - given symptoms, troponin trend, and nuclear findings plan for cath Monday.  - medical therapy with ASA, zetia 10, hep gtt, losartan 25, crestor 20. Start lopressor 12.5mg  bid.   - we will plan for cath tomorrow     2. GERD - started on protonix 40mg  daily, pepcid 20mg  bid - consider outpatient GI eval. Could possibly be a very atypical form of angina, we will see with cath tomorrow.    Patient asked to hold diuretic today and tomorrow, urinating too much. We will hold lasix.    I have reviewed the risks, indications, and alternatives to cardiac catheterization, possible angioplasty, and stenting with the patient today. Risks include but are not limited to bleeding, infection, vascular injury, stroke, myocardial infection, arrhythmia, kidney injury, radiation-related injury in the case of prolonged fluoroscopy use, emergency cardiac surgery, and death. The patient understands the risks of serious complication is 1-2 in 1740 with diagnostic cardiac cath and 1-2% or less with angioplasty/stenting.  For questions or updates, please contact [place] Please consult [email or web address] for contact info under Cardiology/STEMI.      Merrily Pew, MD  11/24/2017, 10:30 AM

## 2017-11-25 ENCOUNTER — Inpatient Hospital Stay (HOSPITAL_COMMUNITY)
Admission: EM | Disposition: A | Discharge status: Home / Self Care | Payer: Self-pay | Source: Home / Self Care | Attending: Cardiovascular Disease

## 2017-11-25 DIAGNOSIS — I2510 Atherosclerotic heart disease of native coronary artery without angina pectoris: Secondary | ICD-10-CM

## 2017-11-25 HISTORY — PX: LEFT HEART CATH AND CORONARY ANGIOGRAPHY: CATH118249

## 2017-11-25 HISTORY — PX: CORONARY STENT INTERVENTION: CATH118234

## 2017-11-25 LAB — POCT ACTIVATED CLOTTING TIME
ACTIVATED CLOTTING TIME: 241 s
ACTIVATED CLOTTING TIME: 367 s
Activated Clotting Time: 389 seconds

## 2017-11-25 LAB — CBC
HCT: 39.3 % (ref 39.0–52.0)
HEMOGLOBIN: 13.1 g/dL (ref 13.0–17.0)
MCH: 30 pg (ref 26.0–34.0)
MCHC: 33.3 g/dL (ref 30.0–36.0)
MCV: 89.9 fL (ref 78.0–100.0)
Platelets: 161 10*3/uL (ref 150–400)
RBC: 4.37 MIL/uL (ref 4.22–5.81)
RDW: 13.2 % (ref 11.5–15.5)
WBC: 5.4 10*3/uL (ref 4.0–10.5)

## 2017-11-25 LAB — BASIC METABOLIC PANEL
Anion gap: 7 (ref 5–15)
BUN: 13 mg/dL (ref 6–20)
CHLORIDE: 106 mmol/L (ref 101–111)
CO2: 25 mmol/L (ref 22–32)
CREATININE: 1.17 mg/dL (ref 0.61–1.24)
Calcium: 8.7 mg/dL — ABNORMAL LOW (ref 8.9–10.3)
GFR calc non Af Amer: 57 mL/min — ABNORMAL LOW (ref >60)
Glucose, Bld: 96 mg/dL (ref 65–99)
POTASSIUM: 3.9 mmol/L (ref 3.5–5.1)
SODIUM: 138 mmol/L (ref 135–145)

## 2017-11-25 LAB — PROTIME-INR
INR: 1.1
PROTHROMBIN TIME: 14.1 s (ref 11.4–15.2)

## 2017-11-25 LAB — HEPARIN LEVEL (UNFRACTIONATED): HEPARIN UNFRACTIONATED: 0.39 [IU]/mL (ref 0.30–0.70)

## 2017-11-25 SURGERY — LEFT HEART CATH AND CORONARY ANGIOGRAPHY
Anesthesia: LOCAL

## 2017-11-25 MED ORDER — NITROGLYCERIN 1 MG/10 ML FOR IR/CATH LAB
INTRA_ARTERIAL | Status: AC
  Filled 2017-11-25: qty 10

## 2017-11-25 MED ORDER — LIDOCAINE HCL (PF) 1 % IJ SOLN
INTRAMUSCULAR | Status: DC | PRN
  Administered 2017-11-25: 2 mL

## 2017-11-25 MED ORDER — HEPARIN SODIUM (PORCINE) 1000 UNIT/ML IJ SOLN
INTRAMUSCULAR | Status: AC
  Filled 2017-11-25: qty 1

## 2017-11-25 MED ORDER — SODIUM CHLORIDE 0.9% FLUSH: 3.0000 mL | INTRAVENOUS | Status: DC | PRN

## 2017-11-25 MED ORDER — HYDRALAZINE HCL 20 MG/ML IJ SOLN: 5.0000 mg | INTRAMUSCULAR | Status: AC | PRN

## 2017-11-25 MED ORDER — ACETAMINOPHEN 325 MG PO TABS: 650.0000 mg | ORAL_TABLET | ORAL | Status: DC | PRN

## 2017-11-25 MED ORDER — FAMOTIDINE IN NACL 20-0.9 MG/50ML-% IV SOLN
INTRAVENOUS | Status: DC | PRN
  Administered 2017-11-25: 20 mg via INTRAVENOUS

## 2017-11-25 MED ORDER — LIDOCAINE HCL 1 % IJ SOLN
INTRAMUSCULAR | Status: AC
  Filled 2017-11-25: qty 20

## 2017-11-25 MED ORDER — VERAPAMIL HCL 2.5 MG/ML IV SOLN
INTRAVENOUS | Status: DC | PRN
  Administered 2017-11-25: 10 mL via INTRA_ARTERIAL

## 2017-11-25 MED ORDER — LABETALOL HCL 5 MG/ML IV SOLN: 10.0000 mg | INTRAVENOUS | Status: AC | PRN

## 2017-11-25 MED ORDER — IOPAMIDOL (ISOVUE-370) INJECTION 76%
INTRAVENOUS | Status: AC
  Filled 2017-11-25: qty 50

## 2017-11-25 MED ORDER — TICAGRELOR 90 MG PO TABS
ORAL_TABLET | ORAL | Status: DC | PRN
  Administered 2017-11-25: 180 mg via ORAL

## 2017-11-25 MED ORDER — FENTANYL CITRATE (PF) 100 MCG/2ML IJ SOLN
INTRAMUSCULAR | Status: DC | PRN
  Administered 2017-11-25 (×2): 25 ug via INTRAVENOUS
  Administered 2017-11-25: 50 ug via INTRAVENOUS

## 2017-11-25 MED ORDER — NITROGLYCERIN 1 MG/10 ML FOR IR/CATH LAB
INTRA_ARTERIAL | Status: DC | PRN
  Administered 2017-11-25: 200 ug via INTRACORONARY

## 2017-11-25 MED ORDER — TICAGRELOR 90 MG PO TABS
90.0000 mg | ORAL_TABLET | Freq: Two times a day (BID) | ORAL | Status: DC
  Administered 2017-11-25 – 2017-11-26 (×2): 90 mg via ORAL
  Filled 2017-11-25 (×2): qty 1

## 2017-11-25 MED ORDER — TICAGRELOR 90 MG PO TABS
ORAL_TABLET | ORAL | Status: AC
  Filled 2017-11-25: qty 2

## 2017-11-25 MED ORDER — ANGIOPLASTY BOOK
Freq: Once | Status: AC
  Administered 2017-11-25: 21:00:00 1
  Filled 2017-11-25: qty 1

## 2017-11-25 MED ORDER — IOPAMIDOL (ISOVUE-370) INJECTION 76%
INTRAVENOUS | Status: AC
  Filled 2017-11-25: qty 100

## 2017-11-25 MED ORDER — HEPARIN (PORCINE) IN NACL 2-0.9 UNIT/ML-% IJ SOLN
INTRAMUSCULAR | Status: AC
  Filled 2017-11-25: qty 500

## 2017-11-25 MED ORDER — SODIUM CHLORIDE 0.9 % IV SOLN
INTRAVENOUS | Status: AC
  Administered 2017-11-25: 12:00:00 via INTRAVENOUS

## 2017-11-25 MED ORDER — SODIUM CHLORIDE 0.9 % IV SOLN: 250.0000 mL | INTRAVENOUS | Status: DC | PRN

## 2017-11-25 MED ORDER — VERAPAMIL HCL 2.5 MG/ML IV SOLN
INTRAVENOUS | Status: AC
  Filled 2017-11-25: qty 2

## 2017-11-25 MED ORDER — SODIUM CHLORIDE 0.9% FLUSH
3.0000 mL | Freq: Two times a day (BID) | INTRAVENOUS | Status: DC
  Administered 2017-11-26: 11:00:00 3 mL via INTRAVENOUS

## 2017-11-25 MED ORDER — MIDAZOLAM HCL 2 MG/2ML IJ SOLN
INTRAMUSCULAR | Status: AC
  Filled 2017-11-25: qty 2

## 2017-11-25 MED ORDER — MIDAZOLAM HCL 2 MG/2ML IJ SOLN
INTRAMUSCULAR | Status: DC | PRN
  Administered 2017-11-25: 0.5 mg via INTRAVENOUS
  Administered 2017-11-25: 1 mg via INTRAVENOUS
  Administered 2017-11-25: 0.5 mg via INTRAVENOUS

## 2017-11-25 MED ORDER — FENTANYL CITRATE (PF) 100 MCG/2ML IJ SOLN
INTRAMUSCULAR | Status: AC
  Filled 2017-11-25: qty 2

## 2017-11-25 MED ORDER — ONDANSETRON HCL 4 MG/2ML IJ SOLN: 4.0000 mg | Freq: Four times a day (QID) | INTRAMUSCULAR | Status: DC | PRN

## 2017-11-25 MED ORDER — HEPARIN (PORCINE) IN NACL 2-0.9 UNIT/ML-% IJ SOLN
INTRAMUSCULAR | Status: AC | PRN
  Administered 2017-11-25 (×3): 500 mL

## 2017-11-25 MED ORDER — FAMOTIDINE IN NACL 20-0.9 MG/50ML-% IV SOLN
INTRAVENOUS | Status: AC
  Filled 2017-11-25: qty 50

## 2017-11-25 MED ORDER — HEPARIN SODIUM (PORCINE) 1000 UNIT/ML IJ SOLN
INTRAMUSCULAR | Status: DC | PRN
  Administered 2017-11-25: 5500 [IU] via INTRAVENOUS
  Administered 2017-11-25: 5000 [IU] via INTRAVENOUS
  Administered 2017-11-25: 3000 [IU] via INTRAVENOUS

## 2017-11-25 MED ORDER — IOPAMIDOL (ISOVUE-370) INJECTION 76%
INTRAVENOUS | Status: DC | PRN
  Administered 2017-11-25: 190 mL via INTRA_ARTERIAL

## 2017-11-25 MED ORDER — HEART ATTACK BOUNCING BOOK
Freq: Once | Status: AC
  Administered 2017-11-25: 1
  Filled 2017-11-25: qty 1

## 2017-11-25 SURGICAL SUPPLY — 29 items
BALLN EMERGE MR 2.5X12 (BALLOONS) ×2
BALLN EMERGE MR PUSH 1.5X15 (BALLOONS) ×2
BALLN ~~LOC~~ EMERGE MR 3.0X8 (BALLOONS) ×2
BALLN ~~LOC~~ EMERGE MR 3.5X12 (BALLOONS) ×2
BALLOON EMERGE MR 2.5X12 (BALLOONS) ×1 IMPLANT
BALLOON EMERGE MR PUSH 1.5X15 (BALLOONS) ×1 IMPLANT
BALLOON ~~LOC~~ EMERGE MR 3.0X8 (BALLOONS) ×1 IMPLANT
BALLOON ~~LOC~~ EMERGE MR 3.5X12 (BALLOONS) ×1 IMPLANT
BAND ZEPHYR COMPRESS 30 LONG (HEMOSTASIS) ×2 IMPLANT
CATH INFINITI 5 FR JL3.5 (CATHETERS) ×2 IMPLANT
CATH INFINITI 5FR ANG PIGTAIL (CATHETERS) ×2 IMPLANT
CATH LAUNCHER 6FR JR4 (CATHETERS) ×2 IMPLANT
CATH OPTITORQUE TIG 4.0 5F (CATHETERS) ×2 IMPLANT
GLIDESHEATH SLEND A-KIT 6F 22G (SHEATH) ×2 IMPLANT
GUIDELINER 6F (CATHETERS) ×2 IMPLANT
GUIDEWIRE INQWIRE 1.5J.035X260 (WIRE) ×1 IMPLANT
INQWIRE 1.5J .035X260CM (WIRE) ×2
KIT ENCORE 26 ADVANTAGE (KITS) ×2 IMPLANT
KIT HEART LEFT (KITS) ×2 IMPLANT
PACK CARDIAC CATHETERIZATION (CUSTOM PROCEDURE TRAY) ×2 IMPLANT
STENT RESOLUTE ONYX 2.75X18 (Permanent Stent) ×2 IMPLANT
STENT RESOLUTE ONYX 3.0X26 (Permanent Stent) ×2 IMPLANT
STENT RESOLUTE ONYX 3.0X8 (Permanent Stent) ×2 IMPLANT
TRANSDUCER W/STOPCOCK (MISCELLANEOUS) ×2 IMPLANT
TUBING CIL FLEX 10 FLL-RA (TUBING) ×2 IMPLANT
VALVE GUARDIAN II ~~LOC~~ HEMO (MISCELLANEOUS) ×2 IMPLANT
WIRE ASAHI MIRACLEBROS12 300CM (WIRE) ×2 IMPLANT
WIRE ASAHI PROWATER 180CM (WIRE) ×2 IMPLANT
WIRE RUNTHROUGH .014X180CM (WIRE) ×2 IMPLANT

## 2017-11-25 NOTE — Care Management Note (Signed)
Case Management Note  Patient Details  Name: Sergio Zawislak MRN: [id number] Date of Birth: 1936/05/29  Subjective/Objective: Pt presented for c/o reflux found to have elevated tropoinin- post cardiac cath 11-25-17. Plan for home on Brilinta. Benefits check completed and pt is aware of cost. 30 day free card provided. Pt uses [facility] [address] [place] and medication is available. PTA Independent from home with support of daughter. No further home needs identified.                  Action/Plan: S/W JUANITA @ [facility] # [phone]    BRILINTA  90 MG  COVER- YES  CO-PAY- $ 73.80  20 % OF [place]  TIER- 2 DRUG  PRIOR APPROVAL- NO   PATIENT IN INITIAL COVERAGE PHASE   90 DAY SUPPLY $ 201.39  PREFERRED PHARMACY : [facility]   Expected Discharge Date:                  Expected Discharge Plan:  Home/Self Care  In-House Referral:  NA  Discharge planning Services  CM Consult, Medication Assistance  Post Acute Care Choice:  NA Choice offered to:  NA  DME Arranged:  N/A DME Agency:  NA  HH Arranged:  NA HH Agency:  NA  Status of Service:  Completed, signed off  If discussed at [place] of Stay Meetings, dates discussed:    Additional Comments:  Bethena Roys, RN 11/25/2017, 4:16 PM

## 2017-11-25 NOTE — Progress Notes (Signed)
ANTICOAGULATION CONSULT NOTE - Follow Up Consult  Pharmacy Consult for Heparin Indication: chest pain/ACS  No Known Allergies  Patient Measurements: Height: 5\' 10"  (177.8 cm) Weight: 235 lb (106.6 kg) IBW/kg (Calculated) : 73 Heparin Dosing Weight: 96 kg  Vital Signs: Temp: 98.1 F (36.7 C) (03/11 0321) Temp Source: Oral (03/11 0321) BP: 138/96 (03/11 0321) Pulse Rate: 70 (03/11 0321)  Labs: Recent Labs    11/23/17 0310 11/24/17 0435 11/25/17 0306  HGB 13.3 13.3 13.1  HCT 39.8 39.4 39.3  PLT 156 161 161  LABPROT  --   --  14.1  INR  --   --  1.10  HEPARINUNFRC 0.34 0.31 0.39  CREATININE 0.99  --  1.17   Estimated Creatinine Clearance: 62.6 mL/min (by C-G formula based on SCr of 1.17 mg/dL).  Assessment: 82 year old male on IV heparin for chest pain/ACS.  Heparin level therapeutic: 0.39 CBC stable, no overt bleeding documented Planning for cath later today  Goal of Therapy:  Heparin level 0.3-0.7 units/ml Monitor platelets by anticoagulation protocol: Yes   Plan:  Continue heparin at 1300 units / hr Daily heparin level, CBC F/u anticoagulation plan post cath   Georga Bora, PharmD Clinical Pharmacist 11/25/2017 8:39 AM

## 2017-11-25 NOTE — Interval H&P Note (Signed)
History and Physical Interval Note:  11/25/2017 8:59 AM  Noah Scott  has presented today for surgery, with the diagnosis of NSTE-ACS-abnormal nuclear stress test the various methods of treatment have been discussed with the patient and family. After consideration of risks, benefits and other options for treatment, the patient has consented to  Procedure(s): LEFT HEART CATH AND CORONARY ANGIOGRAPHY (N/A) as a surgical intervention .  The patient's history has been reviewed, patient examined, no change in status, stable for surgery.  I have reviewed the patient's chart and labs.  Questions were answered to the patient's satisfaction.    Cath Lab Visit (complete for each Cath Lab visit)  Clinical Evaluation Leading to the Procedure:   ACS: Yes.    Non-ACS:    Anginal Classification: CCS III  Anti-ischemic medical therapy: Minimal Therapy (1 class of medications)  Non-Invasive Test Results: Intermediate-risk stress test findings: cardiac mortality 1-3%/year  Prior CABG: No previous CABG  Glenetta Hew

## 2017-11-25 NOTE — Progress Notes (Signed)
TR BAND REMOVAL  LOCATION:    right radial  DEFLATED PER PROTOCOL:    Yes.    TIME BAND OFF / DRESSING APPLIED:    1545    SITE UPON ARRIVAL:    Level 0  SITE AFTER BAND REMOVAL:    Level 0  CIRCULATION SENSATION AND MOVEMENT:    Within Normal Limits   Yes.    COMMENTS:     

## 2017-11-26 ENCOUNTER — Encounter (HOSPITAL_COMMUNITY): Payer: Self-pay | Admitting: Cardiology

## 2017-11-26 DIAGNOSIS — I10 Essential (primary) hypertension: Secondary | ICD-10-CM

## 2017-11-26 DIAGNOSIS — E7800 Pure hypercholesterolemia, unspecified: Secondary | ICD-10-CM

## 2017-11-26 DIAGNOSIS — I2583 Coronary atherosclerosis due to lipid rich plaque: Secondary | ICD-10-CM

## 2017-11-26 LAB — CBC
HCT: 38.7 % — ABNORMAL LOW (ref 39.0–52.0)
HEMOGLOBIN: 12.8 g/dL — AB (ref 13.0–17.0)
MCH: 29.9 pg (ref 26.0–34.0)
MCHC: 33.1 g/dL (ref 30.0–36.0)
MCV: 90.4 fL (ref 78.0–100.0)
Platelets: 158 10*3/uL (ref 150–400)
RBC: 4.28 MIL/uL (ref 4.22–5.81)
RDW: 13.4 % (ref 11.5–15.5)
WBC: 6.9 10*3/uL (ref 4.0–10.5)

## 2017-11-26 LAB — BASIC METABOLIC PANEL
ANION GAP: 8 (ref 5–15)
BUN: 13 mg/dL (ref 6–20)
CALCIUM: 8.4 mg/dL — AB (ref 8.9–10.3)
CHLORIDE: 107 mmol/L (ref 101–111)
CO2: 23 mmol/L (ref 22–32)
Creatinine, Ser: 1.08 mg/dL (ref 0.61–1.24)
GFR calc non Af Amer: >60 mL/min (ref >60)
Glucose, Bld: 94 mg/dL (ref 65–99)
Potassium: 3.9 mmol/L (ref 3.5–5.1)
Sodium: 138 mmol/L (ref 135–145)

## 2017-11-26 MED ORDER — TICAGRELOR 90 MG PO TABS: 90.0000 mg | ORAL_TABLET | Freq: Two times a day (BID) | ORAL | 0 refills | Status: DC

## 2017-11-26 MED ORDER — METOPROLOL TARTRATE 25 MG PO TABS: 12.5000 mg | ORAL_TABLET | Freq: Two times a day (BID) | ORAL | 11 refills | Status: DC

## 2017-11-26 MED ORDER — ROSUVASTATIN CALCIUM 20 MG PO TABS
40.0000 mg | ORAL_TABLET | Freq: Every day | ORAL | Status: DC
  Administered 2017-11-26: 40 mg via ORAL
  Filled 2017-11-26: qty 2

## 2017-11-26 MED ORDER — TICAGRELOR 90 MG PO TABS: 90.0000 mg | ORAL_TABLET | Freq: Two times a day (BID) | ORAL | 11 refills | Status: DC

## 2017-11-26 MED ORDER — LOSARTAN POTASSIUM 50 MG PO TABS
50.0000 mg | ORAL_TABLET | Freq: Every day | ORAL | Status: DC
  Administered 2017-11-26: 50 mg via ORAL
  Filled 2017-11-26: qty 1

## 2017-11-26 MED ORDER — PANTOPRAZOLE SODIUM 40 MG PO TBEC: 40.0000 mg | DELAYED_RELEASE_TABLET | Freq: Every day | ORAL | 3 refills | Status: DC

## 2017-11-26 MED ORDER — NITROGLYCERIN 0.4 MG SL SUBL: 0.4000 mg | SUBLINGUAL_TABLET | SUBLINGUAL | 3 refills | Status: DC | PRN

## 2017-11-26 MED ORDER — POTASSIUM CHLORIDE CRYS ER 20 MEQ PO TBCR: 20.0000 meq | EXTENDED_RELEASE_TABLET | Freq: Every day | ORAL | 11 refills | Status: DC

## 2017-11-26 MED ORDER — LOSARTAN POTASSIUM 50 MG PO TABS: 50.0000 mg | ORAL_TABLET | Freq: Every day | ORAL | 11 refills | Status: DC

## 2017-11-26 MED ORDER — ROSUVASTATIN CALCIUM 40 MG PO TABS: 40.0000 mg | ORAL_TABLET | Freq: Every day | ORAL | 11 refills | Status: DC

## 2017-11-26 MED FILL — Lidocaine HCl Local Inj 1%: INTRAMUSCULAR | Qty: 20 | Status: AC

## 2017-11-26 NOTE — Progress Notes (Signed)
CARDIAC REHAB PHASE I   PRE:  Rate/Rhythm: 68 SR    BP: sitting 165/96    SaO2:   MODE:  Ambulation: 450 ft   POST:  Rate/Rhythm: 108 ST    BP: sitting 199/93     SaO2:   Pt denied "reflux" but was SOB with distance. Sts this is his norm. He also feels congested chronically. BP elevated. Ed completed, good understanding. Understands importance of Brilinta. Will refer to [place]. Pt eager to establish care with [facility].  [place], ACSM 11/26/2017 9:32 AM

## 2017-11-26 NOTE — Discharge Summary (Addendum)
Discharge Summary    Patient ID: Noah Scott,  MRN: [id number], DOB/AGE: Feb 16, 1936 82 y.o.  Admit date: 11/21/2017 Discharge date: 11/26/2017  Primary Care Provider: [facility] Primary Cardiologist: Dr Ellyn Hack  Discharge Diagnoses    Principal Problem:   NSTEMI (non-ST elevated myocardial infarction) [facility]) Active Problems:   Essential (primary) hypertension   Hyperlipidemia   Allergies No Known Allergies  Diagnostic Studies/Procedures    ECHO: 11/23/2017 - Left ventricle: The cavity size was normal. Wall thickness was   normal. Systolic function was normal. The estimated ejection   fraction was in the range of 60% to 65%. Wall motion was normal;   there were no regional wall motion abnormalities. Doppler   parameters are consistent with abnormal left ventricular   relaxation (grade 1 diastolic dysfunction). - Right atrium: The atrium was mildly dilated.  CATH: 11/25/2017  Culprit Lesion #1: Mid RCA to Dist RCA lesion is 95% stenosed.  A drug-eluting stent was successfully placed using a Noah Scott [id number]. Postdilated to almost 3.0 mm  Post intervention, there is a 0% residual stenosis.  Lesion set #2: Ost RCA to Prox RCA lesion is 75% stenosed. Prox RCA lesion is 65% stenosed.  2 overlapping Drug-Eluting Stents were successfully placed using a STENT RESOLUTE ONYX 3.0X26 and a STENT RESOLUTE ONYX 3.0X26. Overlapping proximally (postdilated to 3.5 mill meters proximal down to 3.3 mm distal.  A drug-eluting stent was successfully placed using a  Ost 1st Mrg to 1st Mrg lesion is 65% stenosed. This is within a previously placed stent from 7253  LV end diastolic pressure is normal.  Dist RCA lesion is 50% stenosed with 25% stenosed side branch in Ost RPDA.   Severe 1-2 vessel disease with ostial-proximal RCA as well as mid RCA severe lesions as well as moderate to severe in-stent restenosis in OM1. Difficult, complicated PCI  of 3 separate lesions in the RCA requiring use of extra-support buddy wire.  Plan medical management of the OM lesion, if necessary could bring back for staged PCI if symptoms not fully controlled.   The patient will be transferred to 6 Central post procedure unit for ongoing care and pneumatic band removal.  He is on aspirin plus Brilinta, would continue aspirin for at least 3-6 months, and continue Brilinta for minimum 1 year but consider prolonged Thienopyridine coverage.  Continue aggressive risk factor modification.  Post-Intervention Diagram        _____________   History of Present Illness     82 yo male w/ hx stent 2001 ([place]), HTN, [place] [facility] Course     Consultants: Cardiac rehab   Noah Scott had elevated troponins, ruling in for NSTEMI. He was pain-free on ASA, heparin, statin and BB. EF normal on echo.  He was taken to the cath lab on 03/11, results above. His RCA was the culprit lesion and was stented. Residual disease is to be treated medically.   His BP meds were up-titrated for better control. His statin was increased to high-dose. Check lipids and liver functions in 6 weeks.   He was prescribed Kdur 20 meq bid, but was only taking 1/2 tab at d/c. His potassium level was normal but he tolerated an increase. Will give him 1 tab daily at d/c, f/u BMET as outpatient.  On 03/12, he was seen by Dr Radford Pax and all data were reviewed. He was seen by cardiac rehab and ambulated without chest pain or SOB. No further inpatient  workup is indicated and he is considered stable for discharge, to follow up as an outpatient.  _____________  Discharge Vitals Blood pressure 133/84, pulse 73, temperature 98.2 F (36.8 C), temperature source Oral, resp. rate (!) 22, height 5\' 10"  (1.778 m), weight 235 lb 14.3 oz (107 kg), SpO2 98 %.  Filed Weights   11/21/17 0138 11/26/17 0637  Weight: 235 lb (106.6 kg) 235 lb 14.3 oz (107 kg)  General: Well developed, well nourished, male  in no acute distress Head: Eyes PERRLA, No xanthomas.   Normocephalic and atraumatic  Lungs: Clear bilaterally to auscultation. Heart: HRRR S1 S2, without MRG.  Pulses are 2+ & equal. No carotid bruit. No JVD. Abdomen: Bowel sounds are present, abdomen soft and non-tender without masses or  hernias noted. Msk: Normal strength and tone for age. Extremities: No clubbing, cyanosis or edema.    Skin:  No rashes or lesions noted. Neuro: Alert and oriented X 3. Psych:  Good affect, responds appropriately   Labs & Radiologic Studies    CBC Recent Labs    11/25/17 0306 11/26/17 0253  WBC 5.4 6.9  HGB 13.1 12.8*  HCT 39.3 38.7*  MCV 89.9 90.4  PLT 161 027   Basic Metabolic Panel Recent Labs    11/25/17 0306 11/26/17 0253  NA 138 138  K 3.9 3.9  CL 106 107  CO2 25 23  GLUCOSE 96 94  BUN 13 13  CREATININE 1.17 1.08  CALCIUM 8.7* 8.4*   Liver Function Tests Lab Results  Component Value Date   ALT 23 11/21/2017   AST 26 11/21/2017   ALKPHOS 64 11/21/2017   BILITOT 0.5 11/21/2017   Cardiac Enzymes Troponin I  Date Value Ref Range Status  11/21/2017 0.03 (HH) <0.03 ng/mL Final    Comment:    CRITICAL VALUE NOTED.  VALUE IS CONSISTENT WITH PREVIOUSLY REPORTED AND CALLED VALUE. Performed at [facility], [place] [address] [place], [place] [address]   11/21/2017 0.07 (HH) <0.03 ng/mL Final    Comment:    CRITICAL RESULT CALLED TO, READ BACK BY AND VERIFIED WITH: Calhan [id number] GREEN R Performed at [facility] [address], [place], [place] [address]   11/21/2017 0.07 (HH) <0.03 ng/mL Final    Comment:    CRITICAL VALUE NOTED.  VALUE IS CONSISTENT WITH PREVIOUSLY REPORTED AND CALLED VALUE. Performed at [facility], [address] [place], [place] [address]    _____________  Dg Chest 2 View  Result Date: 11/21/2017 CLINICAL DATA:  Burning sensation in the upper chest EXAM: CHEST - 2 VIEW COMPARISON:  07/03/2017 FINDINGS: Streaky  atelectasis or scarring at the lingula. No acute consolidation or effusion. Normal heart size. Aortic atherosclerosis. No pneumothorax. Degenerative changes of the spine. IMPRESSION: No active cardiopulmonary disease. Electronically Signed   By: Donavan Foil M.D.   On: 11/21/2017 02:56   Nm Myocar Multi W/spect W/wall Motion / Ef  Result Date: 11/22/2017  Large defect in the inferior (base, mid, distal) and inferolateral (base, mid) that is partially reversible with improvement in the inferolateral region. LVEF is 51% with overall normal wall thickening. Changes noted above may reflect soft tissue attenuation (extensive GI uptake and diaphragm limits study), cannot exclude subendocardial scar and mild periinfart ischemia  LVEF 51% with normal wall thickening  Intermediate risk study. Extensive soft tissue activity limits study.    Disposition   Pt is being discharged home today in good condition.  Follow-up Plans & Appointments    Follow-up Information    Leonie Man, MD Follow up.   Specialty:  Cardiology Why:  The office will call. Contact information: [address] [place] [address] [phone]          Discharge Instructions    Amb Referral to Cardiac Rehabilitation   Complete by:  As directed    Diagnosis:   Coronary Stents PTCA     Diet - low sodium heart healthy   Complete by:  As directed    Increase activity slowly   Complete by:  As directed       Discharge Medications   Allergies as of 11/26/2017   No Known Allergies     Medication List    STOP taking these medications   esomeprazole 20 MG packet Commonly known as:  NEXIUM   naproxen 500 MG tablet Commonly known as:  NAPROSYN     TAKE these medications   aspirin EC 81 MG tablet Take 81 mg by mouth daily.   doxazosin 8 MG tablet Commonly known as:  CARDURA Take 8 mg by mouth daily.   ezetimibe 10 MG tablet Commonly known as:  ZETIA Take 10 mg by mouth daily.     furosemide 20 MG tablet Commonly known as:  LASIX Take 20 mg by mouth. Notes to patient:  Ask doctor how he wants you to take this medicine.   losartan 50 MG tablet Commonly known as:  COZAAR Take 1 tablet (50 mg total) by mouth daily. What changed:    medication strength  how much to take   metoprolol tartrate 25 MG tablet Commonly known as:  LOPRESSOR Take 0.5 tablets (12.5 mg total) by mouth 2 (two) times daily.   nitroGLYCERIN 0.4 MG SL tablet Commonly known as:  NITROSTAT Place 1 tablet (0.4 mg total) under the tongue every 5 (five) minutes as needed for chest pain.   pantoprazole 40 MG tablet Commonly known as:  PROTONIX Take 1 tablet (40 mg total) by mouth daily.   potassium chloride SA 20 MEQ tablet Commonly known as:  K-DUR,KLOR-CON Take 1 tablet (20 mEq total) by mouth daily. What changed:  when to take this   rosuvastatin 40 MG tablet Commonly known as:  CRESTOR Take 1 tablet (40 mg total) by mouth daily. What changed:    medication strength  how much to take   ticagrelor 90 MG Tabs tablet Commonly known as:  BRILINTA Take 1 tablet (90 mg total) by mouth 2 (two) times daily.   VICKS VAPOR INHALER IN Inhale 1 Inhaler into the lungs as needed (stuffy nose).        Aspirin prescribed at discharge?  Yes High Intensity Statin Prescribed? (Lipitor 40-80mg  or Crestor 20-40mg ): Yes Beta Blocker Prescribed? Yes For EF <40%, was ACEI/ARB Prescribed? Yes ADP Receptor Inhibitor Prescribed? (i.e. Plavix etc.-Includes Medically Managed Patients): Yes For EF <40%, Aldosterone Inhibitor Prescribed? Yes Was EF assessed during THIS hospitalization? Yes Was Cardiac Rehab II ordered? (Included Medically managed Patients): Yes   Outstanding Labs/Studies   None   Duration of Discharge Encounter   Greater than 30 minutes including physician time.  Alcario Drought Jadriel Saxer Goldie NP 11/26/2017, 1:23 PM

## 2017-11-26 NOTE — Care Management Important Message (Signed)
Important Message  Patient Details  Name: Noah Scott MRN: [id number] Date of Birth: Feb 07, 1936   Medicare Important Message Given:  Yes    Sumiya Mamaril P Lochlann Mastrangelo 11/26/2017, 1:46 PM

## 2017-11-27 ENCOUNTER — Telehealth: Payer: Self-pay | Admitting: Cardiology

## 2017-11-27 ENCOUNTER — Telehealth: Payer: Self-pay | Admitting: Adult Health

## 2017-11-27 ENCOUNTER — Telehealth (HOSPITAL_COMMUNITY): Payer: Self-pay

## 2017-11-27 NOTE — Telephone Encounter (Signed)
Patients insurance is active and benefits verified through Medicare A/B - No co-pay, deductible amount of $185.00/$105.55 has been met, no out of pocket, 20% co-insurance, and no pre-authorization is required. Passport/reference [id number]  Patients insurance is active and benefits verified through [facility] - No co-pay, deductible amount of $50.00/$50.00 has been met, out of pocket amount of $500.00/$11.11 has been met, 20% co-insurance, and no pre-authorization is required. Passport/reference [id number]  Patient will be contacted and scheduled upon review by the RN Navigator.

## 2017-11-27 NOTE — Telephone Encounter (Signed)
Patient contacted regarding discharge from [facility] on 11/26/2017.  Patient understands to follow up with provider K. Purcell Nails, DNP on 12/04/2017 at 2pm at [facility]. Patient understands discharge instructions? YES Patient understands medications and regiment? YES Patient understands to bring all medications to this visit? YES  Patient states he is doing well, got his medications, has been taking walks without discomfort. He would like to start cardiac rehab, explained this will be addressed/ordered at his visit next week.

## 2017-11-27 NOTE — Telephone Encounter (Signed)
Patient is scheduled for TCM with Jory Sims 12-04-17 @2pm 

## 2017-12-03 NOTE — Telephone Encounter (Signed)
close

## 2017-12-03 NOTE — Progress Notes (Signed)
Cardiology Office Note   Date:  12/04/2017   ID:  Noah Scott, DOB 06/18/1936, MRN [id number]  PCP:  Patient, No Pcp Per   Cardiologist: Dr. Ellyn Hack Chief Complaint  Patient presents with  . Follow-up  . Hospitalization Follow-up    Cardiac cath with stents to RCA  . Coronary Artery Disease     History of Present Illness: Noah Scott is a 82 y.o. male who presents for posthospitalization follow-up after admission for NSTEMI, with other history to include hypertension, hyperlipidemia.    The patient had subsequent cardiac catheterization on 11/25/2017 revealing culprit lesion #1 mid RCA to distal RCA-lesion 95% stenosis.  The patient received a drug-eluting stent to distal RCA, with ostial RCA to proximal RCA 75%, with proximal RCA 65% stenosed, and had placement of 2 overlapping drug-eluting stents.  The patient was also found to have ostial first marginal to first marginal lesion at 65%.  This was found to be within a previously placed stent in 2000.  This was treated medically the patient was placed on aspirin and Brilinta which she is to continue for a minimum of 3-6 months with planned continuation of Brilinta for minimum of 1 year.  On discharge the patient was prescribed potassium 20 mEq daily.  Follow-up BMET was to be ordered as an outpatient.   He is here today without any cardiac complaints.  His anginal equivalent is severe heartburn radiating throat.  Since having had stent placement symptoms have completely been eliminated.  He is medically compliant and is not having any side effects from any of the medications.  The patient is requesting a [facility] primary care physician.  He was very pleased with the care he received during his recent hospitalization and would like to stay "in the loop" concerning his health care with [facility].   He also is complaining of significant back pain with radiculopathy also complaining of intermittent claudication symptoms on the right, also feels "like  each of my legs weight 50 pounds."  He is very interested in cardiac rehab.  Past Medical History:  Diagnosis Date  . CAD (coronary artery disease)   . HTN (hypertension)   . Hyperlipemia     Past Surgical History:  Procedure Laterality Date  . CORONARY ANGIOPLASTY WITH STENT PLACEMENT    . CORONARY STENT INTERVENTION N/A 11/25/2017   Procedure: CORONARY STENT INTERVENTION;  Surgeon: Leonie Man, MD;  Location: [facility];  Service: Cardiovascular;  Laterality: N/A;  . LEFT HEART CATH AND CORONARY ANGIOGRAPHY N/A 11/25/2017   Procedure: LEFT HEART CATH AND CORONARY ANGIOGRAPHY;  Surgeon: Leonie Man, MD;  Location: [facility];  Service: Cardiovascular;  Laterality: N/A;     Current Outpatient Medications  Medication Sig Dispense Refill  . Aromatic Inhalants (VICKS VAPOR INHALER IN) Inhale 1 Inhaler into the lungs as needed (stuffy nose).    Marland Kitchen aspirin EC 81 MG tablet Take 81 mg by mouth daily.    Marland Kitchen doxazosin (CARDURA) 8 MG tablet Take 8 mg by mouth daily.    Marland Kitchen ezetimibe (ZETIA) 10 MG tablet Take 10 mg by mouth daily.    . furosemide (LASIX) 20 MG tablet Take 20 mg by mouth.    . losartan (COZAAR) 50 MG tablet Take 1 tablet (50 mg total) by mouth daily. 30 tablet 11  . metoprolol tartrate (LOPRESSOR) 25 MG tablet Take 0.5 tablets (12.5 mg total) by mouth 2 (two) times daily. 30 tablet 11  . nitroGLYCERIN (NITROSTAT) 0.4 MG SL  tablet Place 1 tablet (0.4 mg total) under the tongue every 5 (five) minutes as needed for chest pain. 25 tablet 3  . pantoprazole (PROTONIX) 40 MG tablet Take 1 tablet (40 mg total) by mouth daily. 30 tablet 3  . potassium chloride SA (K-DUR,KLOR-CON) 20 MEQ tablet Take 1 tablet (20 mEq total) by mouth daily. 30 tablet 11  . rosuvastatin (CRESTOR) 40 MG tablet Take 1 tablet (40 mg total) by mouth daily. 30 tablet 11  . ticagrelor (BRILINTA) 90 MG TABS tablet Take 1 tablet (90 mg total) by mouth 2 (two) times daily. 60 tablet 11   No  current facility-administered medications for this visit.     Allergies:   Patient has no known allergies.    Social History:  The patient  reports that  has never smoked. he has never used smokeless tobacco. He reports that he does not drink alcohol or use drugs.   Family History:  The patient's family history includes Heart disease in his father.    ROS: All other systems are reviewed and negative. Unless otherwise mentioned in H&P    PHYSICAL EXAM: VS:  BP 124/70   Pulse 66   Ht 5\' 10"  (1.778 m)   Wt 235 lb (106.6 kg)   BMI 33.72 kg/m  , BMI Body mass index is 33.72 kg/m. GEN: Well nourished, well developed, in no acute distress  HEENT: normal  Neck: no JVD, carotid bruits, or masses Cardiac: RRR; no murmurs, rubs, or gallops,no edema  Respiratory:  clear to auscultation bilaterally, normal work of breathing GI: soft, nontender, nondistended, + BS MS: no deformity or atrophy diminished posterior tibial pulses on the right, 2+ posterior tibial pulse on left Skin: warm and dry, no rash Neuro:  Strength and sensation are intact Psych: euthymic mood, full affect   EKG: NSR rate of 66 bpm.  Recent Labs: 11/21/2017: ALT 23 11/26/2017: BUN 13; Creatinine, Ser 1.08; Hemoglobin 12.8; Platelets 158; Potassium 3.9; Sodium 138    Lipid Panel No results found for: CHOL, TRIG, HDL, CHOLHDL, VLDL, LDLCALC, LDLDIRECT    Wt Readings from Last 3 Encounters:  12/04/17 235 lb (106.6 kg)  11/26/17 235 lb 14.3 oz (107 kg)  09/14/17 240 lb (108.9 kg)      Other studies Reviewed: ECHO: 11/23/2017 - Left ventricle: The cavity size was normal. Wall thickness was normal. Systolic function was normal. The estimated ejection fraction was in the range of 60% to 65%. Wall motion was normal; there were no regional wall motion abnormalities. Doppler parameters are consistent with abnormal left ventricular relaxation (grade 1 diastolic dysfunction). - Right atrium: The atrium was  mildly dilated.  CATH: 11/25/2017  Culprit Lesion #1: Mid RCA to Dist RCA lesion is 95% stenosed.  A drug-eluting stent was successfully placed using a [place] [id number]. Postdilated to almost 3.0 mm  Post intervention, there is a 0% residual stenosis.  Lesion set #2: Ost RCA to Prox RCA lesion is 75% stenosed. Prox RCA lesion is 65% stenosed.  2 overlapping Drug-Eluting Stents were successfully placed using a STENT RESOLUTE ONYX 3.0X26 and a STENT RESOLUTE ONYX 3.0X26. Overlapping proximally (postdilated to 3.5 mill meters proximal down to 3.3 mm distal.  A drug-eluting stent was successfully placed using a  Ost 1st Mrg to 1st Mrg lesion is 65% stenosed. This is within a previously placed stent from 3149  LV end diastolic pressure is normal.  Dist RCA lesion is 50% stenosed with 25% stenosed side branch in [place]  RPDA.  Severe 1-2 vessel disease with ostial-proximal RCA as well as mid RCA severe lesions as well as moderate to severe in-stent restenosis in OM1. Difficult, complicated PCI of 3 separate lesions in the RCA requiring use of extra-support buddy wire.  Plan medical management of the OM lesion, if necessary could bring back for staged PCI if symptoms not fully controlled.   ASSESSMENT AND PLAN:  1.  Coronary artery disease; he is status post hospitalization for recurrent chest pain requiring cardiac catheterization and drug-eluting stents x3 to the right coronary artery.  He has an existing stent to circumflex artery with 60% stenosis noted which was going to be treated medically.  Overall doing very well, elimination of symptoms.  His anginal equivalent is severe heartburn, substernal burning into his throat.  He is medically compliant, he has no side effects from current medication regimen.  He is very interested in cardiac rehab, referral made.  BMET will be ordered today as requested on discharge summary.  2.  Symptoms of intermittent claudication: Worse on  the right than on the left.  Good pulses are palpated in the lower extremity on the left popliteal, posterior tibial, dorsalis pedis.  Diminished on the right.  Check ABIs.  3.  Chronic back pain: Worsening with radiation into the right leg.  The patient will be referred to neurology for evaluation and need for further testing and recommendations for management  4.  PCP request: The patient is given a phone number for physicians accepting new patients through [facility].      Current medicines are reviewed at length with the patient today.    Labs/ tests ordered today include: BMET, ABI's.   Phill Myron. West Pugh, ANP, AACC   12/04/2017 2:30 PM    [facility] [address] [place], [place] [address] Phone: [phone]; Fax: [phone]

## 2017-12-04 ENCOUNTER — Ambulatory Visit (INDEPENDENT_AMBULATORY_CARE_PROVIDER_SITE_OTHER): Payer: Medicare Other | Admitting: Adult Health

## 2017-12-04 ENCOUNTER — Encounter: Payer: Self-pay | Admitting: Adult Health

## 2017-12-04 VITALS — BP 124/70 | HR 66 | Ht 70.0 in | Wt 235.0 lb

## 2017-12-04 DIAGNOSIS — I2510 Atherosclerotic heart disease of native coronary artery without angina pectoris: Secondary | ICD-10-CM | POA: Diagnosis not present

## 2017-12-04 DIAGNOSIS — M545 Low back pain, unspecified: Secondary | ICD-10-CM

## 2017-12-04 DIAGNOSIS — Z79899 Other long term (current) drug therapy: Secondary | ICD-10-CM

## 2017-12-04 DIAGNOSIS — I214 Non-ST elevation (NSTEMI) myocardial infarction: Secondary | ICD-10-CM

## 2017-12-04 DIAGNOSIS — I10 Essential (primary) hypertension: Secondary | ICD-10-CM

## 2017-12-04 DIAGNOSIS — I739 Peripheral vascular disease, unspecified: Secondary | ICD-10-CM

## 2017-12-04 MED ORDER — EZETIMIBE 10 MG PO TABS: 10.0000 mg | ORAL_TABLET | Freq: Every day | ORAL | 3 refills | Status: DC

## 2017-12-04 MED ORDER — FUROSEMIDE 20 MG PO TABS: 20.0000 mg | ORAL_TABLET | Freq: Every day | ORAL | 2 refills | Status: DC

## 2017-12-04 MED ORDER — METOPROLOL TARTRATE 25 MG PO TABS: 12.5000 mg | ORAL_TABLET | Freq: Two times a day (BID) | ORAL | 3 refills | Status: DC

## 2017-12-04 MED ORDER — TICAGRELOR 90 MG PO TABS: 90.0000 mg | ORAL_TABLET | Freq: Two times a day (BID) | ORAL | 3 refills | Status: DC

## 2017-12-04 MED ORDER — ROSUVASTATIN CALCIUM 40 MG PO TABS: 40.0000 mg | ORAL_TABLET | Freq: Every day | ORAL | 3 refills | Status: DC

## 2017-12-04 NOTE — Patient Instructions (Signed)
Medication Instructions:  NO CHANGES- Your physician recommends that you continue on your current medications as directed. Please refer to the Current Medication list given to you today.  If you need a refill on your cardiac medications before your next appointment, please call your pharmacy.  Labwork: bmet today HERE IN OUR OFFICE AT [facility]  Testing/Procedures: Your physician has requested that you have an ankle brachial index (ABI). During this test an ultrasound and blood pressure cuff are used to evaluate the arteries that supply the arms and legs with blood. Allow thirty minutes for this exam. There are no restrictions or special instructions.  Special Instructions: REFERRAL TO NEUROLOGY  OK TO START CARDIAC REHAB  CALL [facility] [phone] FOR A PRIMARY CARE PROVIDER  Follow-Up: Your physician wants you to follow-up in: [place] ([place]), DNP.    Thank you for choosing [facility] at [facility]!!

## 2017-12-04 NOTE — Progress Notes (Signed)
Proud to be working with you.

## 2017-12-05 ENCOUNTER — Encounter (HOSPITAL_COMMUNITY): Payer: Self-pay | Admitting: Cardiology

## 2017-12-05 ENCOUNTER — Encounter: Payer: Self-pay | Admitting: Neurology

## 2017-12-05 LAB — BASIC METABOLIC PANEL
BUN/Creatinine Ratio: 18 (ref 10–24)
BUN: 19 mg/dL (ref 8–27)
CO2: 23 mmol/L (ref 20–29)
Calcium: 9.1 mg/dL (ref 8.6–10.2)
Chloride: 105 mmol/L (ref 96–106)
Creatinine, Ser: 1.07 mg/dL (ref 0.76–1.27)
GFR, EST AFRICAN AMERICAN: 76 mL/min/{1.73_m2} (ref >59)
GFR, EST NON AFRICAN AMERICAN: 66 mL/min/{1.73_m2} (ref >59)
Glucose: 88 mg/dL (ref 65–99)
POTASSIUM: 4.2 mmol/L (ref 3.5–5.2)
Sodium: 143 mmol/L (ref 134–144)

## 2017-12-09 ENCOUNTER — Other Ambulatory Visit: Payer: Self-pay | Admitting: Adult Health

## 2017-12-09 DIAGNOSIS — I739 Peripheral vascular disease, unspecified: Secondary | ICD-10-CM

## 2017-12-10 ENCOUNTER — Ambulatory Visit: Payer: Medicare Other | Admitting: Cardiology

## 2017-12-10 ENCOUNTER — Telehealth (HOSPITAL_COMMUNITY): Payer: Self-pay

## 2017-12-10 NOTE — Telephone Encounter (Signed)
Called to speak with patient in regards to Cardiac Rehab - Scheduled orientation on 02/04/2018 at 8:45am. Patient will attend the 11:15am exc class. Went over insurance with patient and patient verbally stated he understands what he is responsible for. Mailed packet.

## 2017-12-12 ENCOUNTER — Ambulatory Visit (HOSPITAL_COMMUNITY)
Admission: RE | Admit: 2017-12-12 | Discharge: 2017-12-12 | Disposition: A | Discharge status: Home / Self Care | Payer: Medicare Other | Source: Ambulatory Visit | Attending: Cardiology | Admitting: Cardiology

## 2017-12-12 ENCOUNTER — Telehealth: Payer: Self-pay | Admitting: *Deleted

## 2017-12-12 DIAGNOSIS — M545 Low back pain: Secondary | ICD-10-CM | POA: Diagnosis not present

## 2017-12-12 DIAGNOSIS — I10 Essential (primary) hypertension: Secondary | ICD-10-CM | POA: Insufficient documentation

## 2017-12-12 DIAGNOSIS — E785 Hyperlipidemia, unspecified: Secondary | ICD-10-CM | POA: Diagnosis not present

## 2017-12-12 DIAGNOSIS — I739 Peripheral vascular disease, unspecified: Secondary | ICD-10-CM | POA: Insufficient documentation

## 2017-12-12 DIAGNOSIS — Z87891 Personal history of nicotine dependence: Secondary | ICD-10-CM | POA: Insufficient documentation

## 2017-12-12 HISTORY — PX: OTHER SURGICAL HISTORY: SHX169

## 2017-12-12 NOTE — Telephone Encounter (Signed)
PATIENT CAME BY OFFICE. PATIENT HAD A QUESTION OF WHY  CARDIAC REHAB WAS SCHEDULE SO FAR OUT . PATIENT STATES 4 MONTHS  FROM NOW. PATIENT WAS DISCHARGE FROM HOSPITAL  3/111/19  ( NSTEMI, PCI)  RN PLACED A CALL TO CARDIAC REHAB AWAITING RETURN CALL. PATIENT AWARE

## 2017-12-13 NOTE — Telephone Encounter (Signed)
INFORMED PATIENT , THE TIMEFRAME FOR ORIENTATION FOR CARDIAC REHAB  IS THE FIRST OPENING FOR CARDIAC REHAB PER REHAB. ( WILL BE PLACED ON WAITLIST) PATIENT VERBALIZED UNDERSTANDING

## 2018-01-17 DIAGNOSIS — E785 Hyperlipidemia, unspecified: Secondary | ICD-10-CM | POA: Diagnosis not present

## 2018-01-17 DIAGNOSIS — R609 Edema, unspecified: Secondary | ICD-10-CM | POA: Diagnosis not present

## 2018-01-17 DIAGNOSIS — B351 Tinea unguium: Secondary | ICD-10-CM | POA: Diagnosis not present

## 2018-01-17 DIAGNOSIS — I214 Non-ST elevation (NSTEMI) myocardial infarction: Secondary | ICD-10-CM | POA: Diagnosis not present

## 2018-01-17 DIAGNOSIS — G8929 Other chronic pain: Secondary | ICD-10-CM | POA: Diagnosis not present

## 2018-01-17 DIAGNOSIS — I2510 Atherosclerotic heart disease of native coronary artery without angina pectoris: Secondary | ICD-10-CM | POA: Diagnosis not present

## 2018-01-17 DIAGNOSIS — M5440 Lumbago with sciatica, unspecified side: Secondary | ICD-10-CM | POA: Diagnosis not present

## 2018-01-17 DIAGNOSIS — I10 Essential (primary) hypertension: Secondary | ICD-10-CM | POA: Diagnosis not present

## 2018-02-04 ENCOUNTER — Encounter (HOSPITAL_COMMUNITY)
Admission: RE | Admit: 2018-02-04 | Discharge: 2018-02-04 | Disposition: A | Discharge status: Home / Self Care | Payer: Medicare Other | Source: Ambulatory Visit | Attending: Cardiology | Admitting: Cardiology

## 2018-02-04 ENCOUNTER — Encounter (HOSPITAL_COMMUNITY): Payer: Self-pay

## 2018-02-04 VITALS — BP 114/60 | HR 85 | Ht 68.0 in | Wt 229.3 lb

## 2018-02-04 DIAGNOSIS — Z79899 Other long term (current) drug therapy: Secondary | ICD-10-CM | POA: Diagnosis not present

## 2018-02-04 DIAGNOSIS — Z7902 Long term (current) use of antithrombotics/antiplatelets: Secondary | ICD-10-CM | POA: Diagnosis not present

## 2018-02-04 DIAGNOSIS — Z955 Presence of coronary angioplasty implant and graft: Secondary | ICD-10-CM | POA: Diagnosis not present

## 2018-02-04 DIAGNOSIS — I2510 Atherosclerotic heart disease of native coronary artery without angina pectoris: Secondary | ICD-10-CM | POA: Insufficient documentation

## 2018-02-04 DIAGNOSIS — Z7982 Long term (current) use of aspirin: Secondary | ICD-10-CM | POA: Insufficient documentation

## 2018-02-04 DIAGNOSIS — I10 Essential (primary) hypertension: Secondary | ICD-10-CM | POA: Insufficient documentation

## 2018-02-04 DIAGNOSIS — E785 Hyperlipidemia, unspecified: Secondary | ICD-10-CM | POA: Diagnosis not present

## 2018-02-04 DIAGNOSIS — I214 Non-ST elevation (NSTEMI) myocardial infarction: Secondary | ICD-10-CM

## 2018-02-04 NOTE — Progress Notes (Signed)
Cardiac Individual Treatment Plan  Patient Details  Name: Noah Scott MRN: [id number] Date of Birth: 05/16/1936 Referring Provider:   Flowsheet Row CARDIAC REHAB PHASE II ORIENTATION from 02/04/2018 in [facility]  Referring Provider  Glenetta Hew, MD.      Initial Encounter Date:  [facility] PHASE II ORIENTATION from 02/04/2018 in [facility]  Date  02/04/18  Referring Provider  Glenetta Hew, MD.      Visit Diagnosis: 11/25/17 DES x3 RCA  Patient's Home Medications on Admission:  Current Outpatient Medications:  .  Aromatic Inhalants (VICKS VAPOR INHALER IN), Inhale 1 Inhaler into the lungs as needed (stuffy nose)., Disp: , Rfl:  .  aspirin EC 81 MG tablet, Take 81 mg by mouth daily., Disp: , Rfl:  .  doxazosin (CARDURA) 8 MG tablet, Take 8 mg by mouth daily., Disp: , Rfl:  .  ezetimibe (ZETIA) 10 MG tablet, Take 1 tablet (10 mg total) by mouth daily., Disp: 30 tablet, Rfl: 3 .  furosemide (LASIX) 20 MG tablet, Take 1 tablet (20 mg total) by mouth daily., Disp: 30 tablet, Rfl: 2 .  losartan (COZAAR) 50 MG tablet, Take 1 tablet (50 mg total) by mouth daily., Disp: 30 tablet, Rfl: 11 .  metoprolol tartrate (LOPRESSOR) 25 MG tablet, Take 0.5 tablets (12.5 mg total) by mouth 2 (two) times daily., Disp: 30 tablet, Rfl: 3 .  nitroGLYCERIN (NITROSTAT) 0.4 MG SL tablet, Place 1 tablet (0.4 mg total) under the tongue every 5 (five) minutes as needed for chest pain., Disp: 25 tablet, Rfl: 3 .  pantoprazole (PROTONIX) 40 MG tablet, Take 1 tablet (40 mg total) by mouth daily., Disp: 30 tablet, Rfl: 3 .  potassium chloride SA (K-DUR,KLOR-CON) 20 MEQ tablet, Take 1 tablet (20 mEq total) by mouth daily., Disp: 30 tablet, Rfl: 11 .  rosuvastatin (CRESTOR) 40 MG tablet, Take 1 tablet (40 mg total) by mouth daily., Disp: 30 tablet, Rfl: 3 .  ticagrelor (BRILINTA) 90 MG TABS tablet, Take 1 tablet (90 mg total) by mouth 2  (two) times daily., Disp: 60 tablet, Rfl: 3  Past Medical History: Past Medical History:  Diagnosis Date  . CAD (coronary artery disease)   . HTN (hypertension)   . Hyperlipemia     Tobacco Use: Social History   Tobacco Use  Smoking Status Never Smoker  Smokeless Tobacco Never Used    Labs: Recent Review Flowsheet Data    There is no flowsheet data to display.      Capillary Blood Glucose: No results found for: GLUCAP   Exercise Target Goals: Date: 02/04/18  Exercise Program Goal: Individual exercise prescription set using results from initial 6 min walk test and THRR while considering  patient's activity barriers and safety.   Exercise Prescription Goal: Initial exercise prescription builds to 30-45 minutes a day of aerobic activity, 2-3 days per week.  Home exercise guidelines will be given to patient during program as part of exercise prescription that the participant will acknowledge.  Activity Barriers & Risk Stratification: Activity Barriers & Cardiac Risk Stratification - 02/04/18 0740    Activity Barriers & Cardiac Risk Stratification          Activity Barriers  Back Problems;Shortness of Breath Chronic lower back pain that radiates to both legs.    Cardiac Risk Stratification  High           6 Minute Walk: 6 Minute Walk    6 Minute Walk  [place] Name 02/04/18 0920   Phase  Initial   Distance  1000 feet   Walk Time  6 minutes Stopped at 5:30 due to back/leg pain   # of Rest Breaks  1 30 seconds from 5:30 to 6:00   MPH  1.89   METS  1.63   RPE  13   Perceived Dyspnea   2   VO2 Peak  5.72   Symptoms  Yes (comment)   Comments  Patient c/o SOB during walk test. Pt stopped at 5:30 due to lower back and leg pain, which he rated 7/10 on the pain scale.   Resting HR  85 bpm   Resting BP  114/60   Resting Oxygen Saturation   98 %   Exercise Oxygen Saturation  during 6 min walk  97 %   Max Ex. HR  122 bpm   Max Ex. BP  126/70   2 Minute Post BP  114/70           Oxygen Initial Assessment:   Oxygen Re-Evaluation:   Oxygen Discharge (Final Oxygen Re-Evaluation):   Initial Exercise Prescription: Initial Exercise Prescription - 02/04/18 1000    Date of Initial Exercise RX and Referring Provider          Date  02/04/18    Referring Provider  Glenetta Hew, MD.        Recumbant Bike          Level  1    Watts  15    Minutes  10    METs  2.45        NuStep          Level  1    SPM  85    Minutes  10    METs  2        Arm Ergometer          Level  1    Watts  20    Minutes  5    METs  2        Track          Laps  5    Minutes  5    METs  2.74        Prescription Details          Frequency (times per week)  3    Duration  Progress to 30 minutes of continuous aerobic without signs/symptoms of physical distress        Intensity          THRR 40-80% of Max Heartrate  56-113    Ratings of Perceived Exertion  11-13    Perceived Dyspnea  0-4        Progression          Progression  Continue to progress workloads to maintain intensity without signs/symptoms of physical distress.        Resistance Training          Training Prescription  Yes    Weight  2lbs    Reps  10-15           Perform Capillary Blood Glucose checks as needed.  Exercise Prescription Changes:   Exercise Comments:   Exercise Goals and Review: Exercise Goals    Exercise Goals    Row Name 02/04/18 0958   Increase Physical Activity  Yes   Intervention  Provide advice, education, support and counseling about physical activity/exercise needs.;Develop an individualized exercise prescription for aerobic and resistive training  based on initial evaluation findings, risk stratification, comorbidities and participant's personal goals.   Expected Outcomes  Short Term: Attend rehab on a regular basis to increase amount of physical activity.;Long Term: Exercising regularly at least 3-5 days a week.;Long Term: Add in home exercise  to make exercise part of routine and to increase amount of physical activity.   Increase Strength and Stamina  Yes   Intervention  Develop an individualized exercise prescription for aerobic and resistive training based on initial evaluation findings, risk stratification, comorbidities and participant's personal goals.   Expected Outcomes  Short Term: Increase workloads from initial exercise prescription for resistance, speed, and METs.;Short Term: Perform resistance training exercises routinely during rehab and add in resistance training at home;Long Term: Improve cardiorespiratory fitness, muscular endurance and strength as measured by increased METs and functional capacity (6MWT)   Able to understand and use rate of perceived exertion (RPE) scale  Yes   Intervention  Provide education and explanation on how to use RPE scale   Expected Outcomes  Short Term: Able to use RPE daily in rehab to express subjective intensity level;Long Term:  Able to use RPE to guide intensity level when exercising independently   Knowledge and understanding of Target Heart Rate Range (THRR)  Yes   Intervention  Provide education and explanation of THRR including how the numbers were predicted and where they are located for reference   Expected Outcomes  Short Term: Able to state/look up THRR;Long Term: Able to use THRR to govern intensity when exercising independently;Short Term: Able to use daily as guideline for intensity in rehab   Able to check pulse independently  Yes   Intervention  Provide education and demonstration on how to check pulse in carotid and radial arteries.;Review the importance of being able to check your own pulse for safety during independent exercise   Expected Outcomes  Short Term: Able to explain why pulse checking is important during independent exercise;Long Term: Able to check pulse independently and accurately   Understanding of Exercise Prescription  Yes   Intervention  Provide education,  explanation, and written materials on patient's individual exercise prescription   Expected Outcomes  Short Term: Able to explain program exercise prescription;Long Term: Able to explain home exercise prescription to exercise independently          Exercise Goals Re-Evaluation :    Discharge Exercise Prescription (Final Exercise Prescription Changes):   Nutrition:  Target Goals: Understanding of nutrition guidelines, daily intake of sodium 1500mg , cholesterol 200mg , calories 30% from fat and 7% or less from saturated fats, daily to have 5 or more servings of fruits and vegetables.  Biometrics: Pre Biometrics - 02/04/18 0740    Pre Biometrics          Height  5\' 8"  (1.727 m)    Weight  229 lb 4.5 oz (104 kg)    Waist Circumference  45 inches    Hip Circumference  44.25 inches    Waist to Hip Ratio  1.02 %    BMI (Calculated)  34.87    Triceps Skinfold  13 mm    % Body Fat  32.4 %    Grip Strength  38.5 kg    Flexibility  0 in Wearing back brace.    Single Leg Stand  4.69 seconds            Nutrition Therapy Plan and Nutrition Goals:   Nutrition Assessments:   Nutrition Goals Re-Evaluation:   Nutrition Goals Re-Evaluation:   Nutrition  Goals Discharge (Final Nutrition Goals Re-Evaluation):   Psychosocial: Target Goals: Acknowledge presence or absence of significant depression and/or stress, maximize coping skills, provide positive support system. Participant is able to verbalize types and ability to use techniques and skills needed for reducing stress and depression.  Initial Review & Psychosocial Screening: Initial Psych Review & Screening - 02/04/18 0749    Initial Review          Current issues with  None Identified        Family Dynamics          Good Support System?  Yes daughter, grandchildren    Comments  moved to [place] from [place] when granddaughter started [employer]        Barriers          Psychosocial barriers to participate in  program  There are no identifiable barriers or psychosocial needs.        Screening Interventions          Interventions  Encouraged to exercise           Quality of Life Scores: Quality of Life - 02/04/18 0738    Quality of Life Scores          Health/Function Pre  22.8 %    Socioeconomic Pre  24.64 %    Psych/Spiritual Pre  25.5 %    Family Pre  28.5 %    GLOBAL Pre  24.45 %          Scores of 19 and below usually indicate a poorer quality of life in these areas.  A difference of  2-3 points is a clinically meaningful difference.  A difference of 2-3 points in the total score of the Quality of Life Index has been associated with significant improvement in overall quality of life, self-image, physical symptoms, and general health in studies assessing change in quality of life.  PHQ-9: Recent Review Flowsheet Data    There is no flowsheet data to display.     Interpretation of Total Score  Total Score Depression Severity:  1-4 = Minimal depression, 5-9 = Mild depression, 10-14 = Moderate depression, 15-19 = Moderately severe depression, 20-27 = Severe depression   Psychosocial Evaluation and Intervention:   Psychosocial Re-Evaluation:   Psychosocial Discharge (Final Psychosocial Re-Evaluation):   Vocational Rehabilitation: Provide vocational rehab assistance to qualifying candidates.   Vocational Rehab Evaluation & Intervention: Vocational Rehab - 02/04/18 0749    Initial Vocational Rehab Evaluation & Intervention          Assessment shows need for Vocational Rehabilitation  No retired            Education: Education Goals: Education classes will be provided on a weekly basis, covering required topics. Participant will state understanding/return demonstration of topics presented.  Learning Barriers/Preferences: Learning Barriers/Preferences - 02/04/18 1000    Learning Barriers/Preferences          Learning Barriers  Sight    Learning Preferences   Skilled Demonstration;Written Material           Education Topics: Count Your Pulse:  -Group instruction provided by verbal instruction, demonstration, patient participation and written materials to support subject.  Instructors address importance of being able to find your pulse and how to count your pulse when at home without a heart monitor.  Patients get hands on experience counting their pulse with staff help and individually.   Heart Attack, Angina, and Risk Factor Modification:  -Group instruction provided by verbal instruction, video,  and written materials to support subject.  Instructors address signs and symptoms of angina and heart attacks.    Also discuss risk factors for heart disease and how to make changes to improve heart health risk factors.   Functional Fitness:  -Group instruction provided by verbal instruction, demonstration, patient participation, and written materials to support subject.  Instructors address safety measures for doing things around the house.  Discuss how to get up and down off the floor, how to pick things up properly, how to safely get out of a chair without assistance, and balance training.   Meditation and Mindfulness:  -Group instruction provided by verbal instruction, patient participation, and written materials to support subject.  Instructor addresses importance of mindfulness and meditation practice to help reduce stress and improve awareness.  Instructor also leads participants through a meditation exercise.    Stretching for Flexibility and Mobility:  -Group instruction provided by verbal instruction, patient participation, and written materials to support subject.  Instructors lead participants through series of stretches that are designed to increase flexibility thus improving mobility.  These stretches are additional exercise for major muscle groups that are typically performed during regular warm up and cool down.   Hands Only CPR:   -Group verbal, video, and participation provides a basic overview of AHA guidelines for community CPR. Role-play of emergencies allow participants the opportunity to practice calling for help and chest compression technique with discussion of AED use.   Hypertension: -Group verbal and written instruction that provides a basic overview of hypertension including the most recent diagnostic guidelines, risk factor reduction with self-care instructions and medication management.    Nutrition I class: Heart Healthy Eating:  -Group instruction provided by PowerPoint slides, verbal discussion, and written materials to support subject matter. The instructor gives an explanation and review of the Therapeutic Lifestyle Changes diet recommendations, which includes a discussion on lipid goals, dietary fat, sodium, fiber, plant stanol/sterol esters, sugar, and the components of a well-balanced, healthy diet.   Nutrition II class: Lifestyle Skills:  -Group instruction provided by PowerPoint slides, verbal discussion, and written materials to support subject matter. The instructor gives an explanation and review of label reading, grocery shopping for heart health, heart healthy recipe modifications, and ways to make healthier choices when eating out.   Diabetes Question & Answer:  -Group instruction provided by PowerPoint slides, verbal discussion, and written materials to support subject matter. The instructor gives an explanation and review of diabetes co-morbidities, pre- and post-prandial blood glucose goals, pre-exercise blood glucose goals, signs, symptoms, and treatment of hypoglycemia and hyperglycemia, and foot care basics.   Diabetes Blitz:  -Group instruction provided by PowerPoint slides, verbal discussion, and written materials to support subject matter. The instructor gives an explanation and review of the physiology behind type 1 and type 2 diabetes, diabetes medications and rational behind  using different medications, pre- and post-prandial blood glucose recommendations and Hemoglobin A1c goals, diabetes diet, and exercise including blood glucose guidelines for exercising safely.    Portion Distortion:  -Group instruction provided by PowerPoint slides, verbal discussion, written materials, and food models to support subject matter. The instructor gives an explanation of serving size versus portion size, changes in portions sizes over the last 20 years, and what consists of a serving from each food group.   Stress Management:  -Group instruction provided by verbal instruction, video, and written materials to support subject matter.  Instructors review role of stress in heart disease and how to cope with stress positively.  Exercising on Your Own:  -Group instruction provided by verbal instruction, power point, and written materials to support subject.  Instructors discuss benefits of exercise, components of exercise, frequency and intensity of exercise, and end points for exercise.  Also discuss use of nitroglycerin and activating EMS.  Review options of places to exercise outside of rehab.  Review guidelines for sex with heart disease.   Cardiac Drugs I:  -Group instruction provided by verbal instruction and written materials to support subject.  Instructor reviews cardiac drug classes: antiplatelets, anticoagulants, beta blockers, and statins.  Instructor discusses reasons, side effects, and lifestyle considerations for each drug class.   Cardiac Drugs II:  -Group instruction provided by verbal instruction and written materials to support subject.  Instructor reviews cardiac drug classes: angiotensin converting enzyme inhibitors (ACE-I), angiotensin II receptor blockers (ARBs), nitrates, and calcium channel blockers.  Instructor discusses reasons, side effects, and lifestyle considerations for each drug class.   Anatomy and Physiology of the Circulatory System:  Group  verbal and written instruction and models provide basic cardiac anatomy and physiology, with the coronary electrical and arterial systems. Review of: AMI, Angina, Valve disease, Heart Failure, Peripheral Artery Disease, Cardiac Arrhythmia, Pacemakers, and the ICD.   Other Education:  -Group or individual verbal, written, or video instructions that support the educational goals of the cardiac rehab program.   Holiday Eating Survival Tips:  -Group instruction provided by PowerPoint slides, verbal discussion, and written materials to support subject matter. The instructor gives patients tips, tricks, and techniques to help them not only survive but enjoy the holidays despite the onslaught of food that accompanies the holidays.   Knowledge Questionnaire Score: Knowledge Questionnaire Score - 02/04/18 0737    Knowledge Questionnaire Score          Pre Score  18/24           Core Components/Risk Factors/Patient Goals at Admission: Personal Goals and Risk Factors at Admission - 02/04/18 0955    Core Components/Risk Factors/Patient Goals on Admission           Weight Management  Yes;Obesity    Intervention  Weight Management: Develop a combined nutrition and exercise program designed to reach desired caloric intake, while maintaining appropriate intake of nutrient and fiber, sodium and fats, and appropriate energy expenditure required for the weight goal.;Weight Management: Provide education and appropriate resources to help participant work on and attain dietary goals.;Weight Management/Obesity: Establish reasonable short term and long term weight goals.;Obesity: Provide education and appropriate resources to help participant work on and attain dietary goals.    Admit Weight  229 lb 4.5 oz (104 kg)    Goal Weight: Short Term  223 lb (101.2 kg)    Goal Weight: Long Term  199 lb (90.3 kg)    Expected Outcomes  Short Term: Continue to assess and modify interventions until short term weight is  achieved;Long Term: Adherence to nutrition and physical activity/exercise program aimed toward attainment of established weight goal;Weight Loss: Understanding of general recommendations for a balanced deficit meal plan, which promotes 1-2 lb weight loss per week and includes a negative energy balance of [phone] kcal/d;Understanding recommendations for meals to include 15-35% energy as protein, 25-35% energy from fat, 35-60% energy from carbohydrates, less than 200mg  of dietary cholesterol, 20-35 gm of total fiber daily;Understanding of distribution of calorie intake throughout the day with the consumption of 4-5 meals/snacks    Hypertension  Yes    Intervention  Provide education on lifestyle modifcations including regular physical  activity/exercise, weight management, moderate sodium restriction and increased consumption of fresh fruit, vegetables, and low fat dairy, alcohol moderation, and smoking cessation.;Monitor prescription use compliance.    Expected Outcomes  Short Term: Continued assessment and intervention until BP is < 140/31mm HG in hypertensive participants. < 130/61mm HG in hypertensive participants with diabetes, heart failure or chronic kidney disease.;Long Term: Maintenance of blood pressure at goal levels.    Lipids  Yes    Intervention  Provide education and support for participant on nutrition & aerobic/resistive exercise along with prescribed medications to achieve LDL 70mg , HDL >40mg .    Expected Outcomes  Short Term: Participant states understanding of desired cholesterol values and is compliant with medications prescribed. Participant is following exercise prescription and nutrition guidelines.;Long Term: Cholesterol controlled with medications as prescribed, with individualized exercise RX and with personalized nutrition plan. Value goals: LDL < 70mg , HDL > 40 mg.           Core Components/Risk Factors/Patient Goals Review:    Core Components/Risk Factors/Patient Goals at  Discharge (Final Review):    ITP Comments: ITP Comments    Row Name 02/04/18 0735   ITP Comments  Medical Director- Dr. Fransico Him, MD      Comments: Patient attended orientation from   0730 to 0740  to review rules and guidelines for program. Completed 6 minute walk test, Intitial ITP, and exercise prescription.  VSS. Telemetry-sinus rhythm, IRBBB, PVC.   Asymptomatic. Andi Hence, RN, BSN Cardiac Pulmonary Rehab

## 2018-02-05 NOTE — Progress Notes (Signed)
Noah Scott 82 y.o. male DOB: 10-Apr-1936 MRN: [id number]      Nutrition Note  1. NSTEMI (non-ST elevated myocardial infarction) ([place])   2. 11/25/17 DES x3 RCA    Past Medical History:  Diagnosis Date  . CAD (coronary artery disease)   . HTN (hypertension)   . Hyperlipemia    Meds reviewed.   HT: Ht Readings from Last 1 Encounters:  02/04/18 5\' 8"  (1.727 m)    WT: Wt Readings from Last 5 Encounters:  02/04/18 229 lb 4.5 oz (104 kg)  12/04/17 235 lb (106.6 kg)  11/26/17 235 lb 14.3 oz (107 kg)  09/14/17 240 lb (108.9 kg)  07/03/17 240 lb (108.9 kg)     Body mass index is 34.86 kg/m.   Current tobacco use? No   Labs:  Lipid Panel  No results found for: CHOL, TRIG, HDL, CHOLHDL, VLDL, LDLCALC, LDLDIRECT  No results found for: HGBA1C CBG (last 3)  No results for input(s): GLUCAP in the last 72 hours.  Nutrition Diagnosis ? Food-and nutrition-related knowledge deficit related to lack of exposure to information as related to diagnosis of: ? CVD ? Obesity related to excessive energy intake as evidenced by a Body mass index is 34.86 kg/m.  Nutrition Goal(s):  ? To be determined  Plan:  Pt to attend nutrition classes ? Nutrition I ? Nutrition II ? Portion Distortion  Will provide client-centered nutrition education as part of interdisciplinary care.   Monitor and evaluate progress toward nutrition goal with team.  Derek Mound, M.Ed, RD, LDN, CDE 02/05/2018 4:06 PM

## 2018-02-06 ENCOUNTER — Encounter: Payer: Self-pay | Admitting: Neurology

## 2018-02-06 ENCOUNTER — Ambulatory Visit (INDEPENDENT_AMBULATORY_CARE_PROVIDER_SITE_OTHER): Payer: Medicare Other | Admitting: Neurology

## 2018-02-06 VITALS — BP 120/80 | HR 73 | Ht 70.0 in | Wt 228.4 lb

## 2018-02-06 DIAGNOSIS — R292 Abnormal reflex: Secondary | ICD-10-CM | POA: Diagnosis not present

## 2018-02-06 DIAGNOSIS — M48062 Spinal stenosis, lumbar region with neurogenic claudication: Secondary | ICD-10-CM

## 2018-02-06 HISTORY — DX: Spinal stenosis, lumbar region with neurogenic claudication: M48.062

## 2018-02-06 MED ORDER — TIZANIDINE HCL 2 MG PO CAPS: 2.0000 mg | ORAL_CAPSULE | Freq: Every day | ORAL | 5 refills | Status: DC

## 2018-02-06 NOTE — Patient Instructions (Addendum)
MRI lumbar spine without contrast  Start tizanidine 2mg  at bedtime  We will call you with the results

## 2018-02-06 NOTE — Progress Notes (Signed)
[facility] Clinic Note - Initial Visit   Date: 02/06/18  Noah Scott MRN: [id number] DOB: 12/06/1935   Dear Dr. Ellyn Hack:  Thank you for your kind referral of Noah Scott for consultation of low back pain. Although his history is well known to you, please allow [place] to reiterate it for the purpose of our medical record. The patient was accompanied to the clinic by self.    History of Present Illness: Noah Scott is a 82 y.o. right-handed Caucasian male with CAD s/p PCI with RCA stent, hypertension, hyperlipidemia, arthritis and GERD presenting for evaluation of low back pain.    Since 2015, he has constant low back pain, described as achy and dull across the low back and radiates into both lower legs.  Pain is constant and worse with prolonged activity such as walking and standing.  He has stiffness in the legs, especially in the morning.  If he is in a squatting position, he has difficulty standing back up.  Resting and repositioning can alleviate some of the pain.  He has noticed that his posture is becoming more stooped, because it is more comfortable and tends to use the shopping cart at grocery stores for some relief.  He has not had any falls and continues to walk unassisted. He denies any numbness/tingling. He has seen neurology when living in [place] and has NCS/EMG which per patients report, was normal.  He also had MRI lumbar spine many years ago, but does not have this for me to review. He was told to wear a back brace.  He has not done physical therapy.   He recently had NSTEMI and was walking laps in cardiac rehab, but with his 6th lap, he developed sharp pain in the lower legs, which prompted his referral to see me.    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 He moved from [place] to [place] in 2018 to be closer to his daughter.   Out-side paper records, electronic medical record,  and images have been reviewed where available and summarized as:  XR lumbar spine 05/27/2017:  Lumbar degenerative disc disease  Past Medical History:  Diagnosis Date  . CAD (coronary artery disease)   . HTN (hypertension)   . Hyperlipemia     Past Surgical History:  Procedure Laterality Date  . CORONARY ANGIOPLASTY WITH STENT PLACEMENT    . CORONARY STENT INTERVENTION N/A 11/25/2017   Procedure: CORONARY STENT INTERVENTION;  Surgeon: Leonie Man, MD;  Location: [facility];  Service: Cardiovascular;  Laterality: N/A;  . LEFT HEART CATH AND CORONARY ANGIOGRAPHY N/A 11/25/2017   Procedure: LEFT HEART CATH AND CORONARY ANGIOGRAPHY;  Surgeon: Leonie Man, MD;  Location: [facility];  Service: Cardiovascular;  Laterality: N/A;     Medications:  Outpatient Encounter Medications as of 02/06/2018  Medication Sig  . Aromatic Inhalants (VICKS VAPOR INHALER IN) Inhale 1 Inhaler into the lungs as needed (stuffy nose).  Marland Kitchen aspirin EC 81 MG tablet Take 81 mg by mouth daily.  Marland Kitchen doxazosin (CARDURA) 8 MG tablet Take 8 mg by mouth daily.  Marland Kitchen ezetimibe (ZETIA) 10 MG tablet Take 1 tablet (10 mg total) by mouth daily.  . furosemide (LASIX) 20 MG tablet Take 1 tablet (20 mg total) by mouth daily.  Marland Kitchen losartan (COZAAR) 50 MG tablet Take 1 tablet (50 mg total) by mouth daily.  . metoprolol tartrate (LOPRESSOR) 25  MG tablet Take 0.5 tablets (12.5 mg total) by mouth 2 (two) times daily.  . nitroGLYCERIN (NITROSTAT) 0.4 MG SL tablet Place 1 tablet (0.4 mg total) under the tongue every 5 (five) minutes as needed for chest pain.  . pantoprazole (PROTONIX) 40 MG tablet Take 1 tablet (40 mg total) by mouth daily.  . potassium chloride SA (K-DUR,KLOR-CON) 20 MEQ tablet Take 1 tablet (20 mEq total) by mouth daily.  . rosuvastatin (CRESTOR) 40 MG tablet Take 1 tablet (40 mg total) by mouth daily.  . ticagrelor (BRILINTA) 90 MG TABS tablet Take 1 tablet (90 mg total) by mouth 2 (two) times daily.    . tizanidine (ZANAFLEX) 2 MG capsule Take 1 capsule (2 mg total) by mouth at bedtime.   No facility-administered encounter medications on file as of 02/06/2018.      Allergies:  Allergies  Allergen Reactions  . Enalapril   . Plavix [Clopidogrel Bisulfate]     Family History: Family History  Problem Relation Age of Onset  . Heart disease Father        Pt does not know details  . Stroke Neg Hx   . Diabetes Neg Hx     Social History: Social History   Tobacco Use  . Smoking status: Never Smoker  . Smokeless tobacco: Never Used  Substance Use Topics  . Alcohol use: No  . Drug use: No   Social History   Social History Narrative   Lives alone in an apartment on the first floor.  Has one daughter.  Retired Geophysical data processor.  Education: some college.     Review of Systems:  CONSTITUTIONAL: No fevers, chills, night sweats, or weight loss.   EYES: No visual changes or eye pain ENT: No hearing changes.  No history of nose bleeds.   RESPIRATORY: No cough, wheezing +shortness of breath.   CARDIOVASCULAR: Negative for chest pain, and palpitations.   GI: Negative for abdominal discomfort, blood in stools or black stools.  No recent change in bowel habits.   GU:  No history of incontinence.   MUSCLOSKELETAL: +history of joint pain or swelling.  No myalgias.   SKIN: Negative for lesions, rash, and itching.   HEMATOLOGY/ONCOLOGY: Negative for prolonged bleeding, bruising easily, and swollen nodes.  No history of cancer.   ENDOCRINE: Negative for cold or heat intolerance, polydipsia or goiter.   PSYCH:  No depression or anxiety symptoms.   NEURO: As Above.   Vital Signs:  BP 120/80   Pulse 73   Ht 5\' 10"  (1.778 m)   Wt 228 lb 6 oz (103.6 kg)   SpO2 93%   BMI 32.77 kg/m    General Medical Exam:   General:  Well appearing, comfortable.   Eyes/ENT: see cranial nerve examination.   Neck: No masses appreciated.  Full range of motion without tenderness.  No carotid  bruits. Respiratory:  Clear to auscultation, good air entry bilaterally.   Cardiac:  Regular rate and rhythm, no murmur.   Extremities:  No deformities, edema, or skin discoloration.  Skin:  No rashes or lesions.  Neurological Exam: MENTAL STATUS including orientation to time, place, person, recent and remote memory, attention span and concentration, language, and fund of knowledge is normal.  Speech is not dysarthric.  CRANIAL NERVES: II:  No visual field defects.  Unremarkable fundi.   III-IV-VI: Pupils equal round and reactive to light.  Normal conjugate, extra-ocular eye movements in all directions of gaze.  No nystagmus.  No ptosis.  V:  Normal facial sensation.   VII:  Normal facial symmetry and movements.  VIII:  Normal hearing and vestibular function.   IX-X:  Normal palatal movement.   XI:  Normal shoulder shrug and head rotation.   XII:  Normal tongue strength and range of motion, no deviation or fasciculation.  MOTOR:  No atrophy, fasciculations or abnormal movements.  No pronator drift.  Tone is normal.    Right Upper Extremity:    Left Upper Extremity:    Deltoid  5/5   Deltoid  5/5   Biceps  5/5   Biceps  5/5   Triceps  5/5   Triceps  5/5   Wrist extensors  5/5   Wrist extensors  5/5   Wrist flexors  5/5   Wrist flexors  5/5   Finger extensors  5/5   Finger extensors  5/5   Finger flexors  5/5   Finger flexors  5/5   Dorsal interossei  5/5   Dorsal interossei  5/5   Abductor pollicis  5/5   Abductor pollicis  5/5   Tone (Ashworth scale)  0  Tone (Ashworth scale)  0   Right Lower Extremity:    Left Lower Extremity:    Hip flexors  5/5   Hip flexors  5/5   Hip extensors  5/5   Hip extensors  5/5   Knee flexors  5/5   Knee flexors  5/5   Knee extensors  5/5   Knee extensors  5/5   Dorsiflexors  5/5   Dorsiflexors  5/5   Plantarflexors  5/5   Plantarflexors  5/5   Toe extensors  5/5   Toe extensors  5/5   Toe flexors  5/5   Toe flexors  5/5   Tone (Ashworth scale)   0  Tone (Ashworth scale)  0   MSRs:  Right                                                                 Left brachioradialis 2+  brachioradialis 2+  biceps 2+  biceps 2+  triceps 2+  triceps 2+  patellar 3+  patellar 3+  ankle jerk tr  ankle jerk tr  Hoffman no  Hoffman no  plantar response down  plantar response down   SENSORY:  Normal and symmetric perception of light touch, pinprick, vibration, and proprioception.     COORDINATION/GAIT: Normal finger-to- nose-finger and heel-to-shin.  Intact rapid alternating movements bilaterally.   Gait narrow based and stable. Tandem and stressed gait intact.    IMPRESSION: Chronic low back pain with neurogenic claudication due to suspected lumbar canal stenosis. I will plan to get MRI lumbar spine to assess for spinal stenosis.  We discussed management options including physical therapy, muscle relaxants, epidural spinal injections, and if no relief with conservative therapies, surgery.  He was informed that my office does not do chronic pain management and with his pain being so longstanding, he most likely will need to see pain management going forward for ESI. I will start him on low dose tizanidine 2mg  at bedtime and titrate as tolerable.  He will also need physical therapy. Further recommendations pending MRI results.   Thank you for allowing me to participate in patient's care.  If  I can answer any additional questions, I would be pleased to do so.    Sincerely,    Cherilynn Schomburg K. Posey Pronto, DO

## 2018-02-12 ENCOUNTER — Encounter (HOSPITAL_COMMUNITY): Payer: Medicare Other

## 2018-02-12 ENCOUNTER — Encounter (HOSPITAL_COMMUNITY): Payer: Self-pay

## 2018-02-12 ENCOUNTER — Encounter (HOSPITAL_COMMUNITY)
Admission: RE | Admit: 2018-02-12 | Discharge: 2018-02-12 | Disposition: A | Discharge status: Home / Self Care | Payer: Medicare Other | Source: Ambulatory Visit | Attending: Cardiology | Admitting: Cardiology

## 2018-02-12 DIAGNOSIS — Z955 Presence of coronary angioplasty implant and graft: Secondary | ICD-10-CM

## 2018-02-12 DIAGNOSIS — I214 Non-ST elevation (NSTEMI) myocardial infarction: Secondary | ICD-10-CM

## 2018-02-12 DIAGNOSIS — I10 Essential (primary) hypertension: Secondary | ICD-10-CM | POA: Diagnosis not present

## 2018-02-12 DIAGNOSIS — Z79899 Other long term (current) drug therapy: Secondary | ICD-10-CM | POA: Diagnosis not present

## 2018-02-12 DIAGNOSIS — E785 Hyperlipidemia, unspecified: Secondary | ICD-10-CM | POA: Diagnosis not present

## 2018-02-12 DIAGNOSIS — I2510 Atherosclerotic heart disease of native coronary artery without angina pectoris: Secondary | ICD-10-CM | POA: Diagnosis not present

## 2018-02-12 DIAGNOSIS — Z7982 Long term (current) use of aspirin: Secondary | ICD-10-CM | POA: Diagnosis not present

## 2018-02-12 NOTE — Progress Notes (Signed)
Daily Session Note  Patient Details  Name: Noah Scott MRN: [id number] Date of Birth: 07-10-1936 Referring Provider:     CARDIAC REHAB PHASE II ORIENTATION from 02/04/2018 in [facility]  Referring Provider  Glenetta Hew, MD.      Encounter Date: 02/12/2018  Check In: Session Check In - 02/12/18 1147      Check-In   Location  [facility]    Staff Present  Barnet Pall, RN, Mosie Epstein, MS,ACSM CEP, Exercise Physiologist;Ryder Chesmore Karle Starch, RN BSN    Supervising physician immediately available to respond to emergencies  [facility] immediately available    Physician(s)  Dr Maylene Roes    Medication changes reported      No    Fall or balance concerns reported     No    Tobacco Cessation  No Change    Warm-up and Cool-down  Performed as group-led instruction    Resistance Training Performed  No    VAD Patient?  No      Pain Assessment   Currently in Pain?  No/denies       Capillary Blood Glucose: No results found for this or any previous visit (from the past 24 hour(s)).  Exercise Prescription Changes - 02/12/18 1400      Response to Exercise   Blood Pressure (Admit)  110/70    Blood Pressure (Exercise)  152/70    Blood Pressure (Exit)  118/68    Heart Rate (Admit)  71 bpm    Heart Rate (Exercise)  117 bpm    Heart Rate (Exit)  81 bpm    Rating of Perceived Exertion (Exercise)  11    Perceived Dyspnea (Exercise)  0    Symptoms  None    Comments  Pt oriented to exercise equipment    Duration  Progress to 30 minutes of  aerobic without signs/symptoms of physical distress    Intensity  THRR New      Progression   Progression  Continue to progress workloads to maintain intensity without signs/symptoms of physical distress.    Average METs  2.5      Resistance Training   Training Prescription  No      Recumbant Bike   Level  1    Watts  21    Minutes  15    METs  2.6      NuStep   Level  1    SPM  85    Minutes  15    METs  1       Social History   Tobacco Use  Smoking Status Never Smoker  Smokeless Tobacco Never Used    Goals Met:  Exercise tolerated well  Goals Unmet:  Not Applicable  Comments: Pt started cardiac rehab today.  Pt tolerated light exercise without difficulty. VSS, telemetry-SR, asymptomatic.  Medication list reconciled. Pt denies barriers to medicaiton compliance.  PSYCHOSOCIAL ASSESSMENT:  PHQ-0. Pt exhibits positive coping skills, hopeful outlook with supportive family. No psychosocial needs identified at this time, no psychosocial interventions necessary.  Pt oriented to exercise equipment and routine.    Understanding verbalized.    Dr. Fransico Him is Medical Director for Cardiac Rehab at [facility].

## 2018-02-14 ENCOUNTER — Encounter (HOSPITAL_COMMUNITY): Payer: Medicare Other

## 2018-02-14 ENCOUNTER — Encounter (HOSPITAL_COMMUNITY)
Admission: RE | Admit: 2018-02-14 | Discharge: 2018-02-14 | Disposition: A | Discharge status: Home / Self Care | Payer: Medicare Other | Source: Ambulatory Visit | Attending: Cardiology | Admitting: Cardiology

## 2018-02-14 DIAGNOSIS — I214 Non-ST elevation (NSTEMI) myocardial infarction: Secondary | ICD-10-CM

## 2018-02-14 DIAGNOSIS — I10 Essential (primary) hypertension: Secondary | ICD-10-CM | POA: Diagnosis not present

## 2018-02-14 DIAGNOSIS — Z79899 Other long term (current) drug therapy: Secondary | ICD-10-CM | POA: Diagnosis not present

## 2018-02-14 DIAGNOSIS — Z955 Presence of coronary angioplasty implant and graft: Secondary | ICD-10-CM | POA: Diagnosis not present

## 2018-02-14 DIAGNOSIS — I2510 Atherosclerotic heart disease of native coronary artery without angina pectoris: Secondary | ICD-10-CM | POA: Diagnosis not present

## 2018-02-14 DIAGNOSIS — Z7982 Long term (current) use of aspirin: Secondary | ICD-10-CM | POA: Diagnosis not present

## 2018-02-14 DIAGNOSIS — E785 Hyperlipidemia, unspecified: Secondary | ICD-10-CM | POA: Diagnosis not present

## 2018-02-17 ENCOUNTER — Encounter (HOSPITAL_COMMUNITY): Payer: Medicare Other

## 2018-02-17 ENCOUNTER — Encounter (HOSPITAL_COMMUNITY)
Admission: RE | Admit: 2018-02-17 | Discharge: 2018-02-17 | Disposition: A | Discharge status: Home / Self Care | Payer: Medicare Other | Source: Ambulatory Visit | Attending: Cardiology | Admitting: Cardiology

## 2018-02-17 ENCOUNTER — Telehealth: Payer: Self-pay | Admitting: Neurology

## 2018-02-17 DIAGNOSIS — Z79899 Other long term (current) drug therapy: Secondary | ICD-10-CM | POA: Insufficient documentation

## 2018-02-17 DIAGNOSIS — I2510 Atherosclerotic heart disease of native coronary artery without angina pectoris: Secondary | ICD-10-CM | POA: Diagnosis not present

## 2018-02-17 DIAGNOSIS — E785 Hyperlipidemia, unspecified: Secondary | ICD-10-CM | POA: Insufficient documentation

## 2018-02-17 DIAGNOSIS — Z7982 Long term (current) use of aspirin: Secondary | ICD-10-CM | POA: Insufficient documentation

## 2018-02-17 DIAGNOSIS — Z7902 Long term (current) use of antithrombotics/antiplatelets: Secondary | ICD-10-CM | POA: Insufficient documentation

## 2018-02-17 DIAGNOSIS — R5383 Other fatigue: Secondary | ICD-10-CM | POA: Diagnosis not present

## 2018-02-17 DIAGNOSIS — I10 Essential (primary) hypertension: Secondary | ICD-10-CM | POA: Insufficient documentation

## 2018-02-17 DIAGNOSIS — R202 Paresthesia of skin: Secondary | ICD-10-CM | POA: Diagnosis not present

## 2018-02-17 DIAGNOSIS — R739 Hyperglycemia, unspecified: Secondary | ICD-10-CM | POA: Diagnosis not present

## 2018-02-17 DIAGNOSIS — I214 Non-ST elevation (NSTEMI) myocardial infarction: Secondary | ICD-10-CM

## 2018-02-17 DIAGNOSIS — Z955 Presence of coronary angioplasty implant and graft: Secondary | ICD-10-CM | POA: Insufficient documentation

## 2018-02-17 NOTE — Telephone Encounter (Signed)
Patient has not heard anything from the MRI place to schedule appt. He would like a phone call back about this matter he states that It has been 2 weeks

## 2018-02-18 NOTE — Telephone Encounter (Signed)
I called [place] said that she would call patient this morning.  Patient scheduled for June 6.

## 2018-02-19 ENCOUNTER — Encounter (HOSPITAL_COMMUNITY)
Admission: RE | Admit: 2018-02-19 | Discharge: 2018-02-19 | Disposition: A | Discharge status: Home / Self Care | Payer: Medicare Other | Source: Ambulatory Visit | Attending: Cardiology | Admitting: Cardiology

## 2018-02-19 ENCOUNTER — Encounter (HOSPITAL_COMMUNITY): Payer: Medicare Other

## 2018-02-19 DIAGNOSIS — I10 Essential (primary) hypertension: Secondary | ICD-10-CM | POA: Diagnosis not present

## 2018-02-19 DIAGNOSIS — I214 Non-ST elevation (NSTEMI) myocardial infarction: Secondary | ICD-10-CM

## 2018-02-19 DIAGNOSIS — Z955 Presence of coronary angioplasty implant and graft: Secondary | ICD-10-CM | POA: Diagnosis not present

## 2018-02-19 DIAGNOSIS — E785 Hyperlipidemia, unspecified: Secondary | ICD-10-CM | POA: Diagnosis not present

## 2018-02-19 DIAGNOSIS — Z7982 Long term (current) use of aspirin: Secondary | ICD-10-CM | POA: Diagnosis not present

## 2018-02-19 DIAGNOSIS — Z79899 Other long term (current) drug therapy: Secondary | ICD-10-CM | POA: Diagnosis not present

## 2018-02-19 DIAGNOSIS — I2510 Atherosclerotic heart disease of native coronary artery without angina pectoris: Secondary | ICD-10-CM | POA: Diagnosis not present

## 2018-02-19 DIAGNOSIS — R5383 Other fatigue: Secondary | ICD-10-CM | POA: Diagnosis not present

## 2018-02-20 ENCOUNTER — Ambulatory Visit
Admission: RE | Admit: 2018-02-20 | Discharge: 2018-02-20 | Disposition: A | Discharge status: Home / Self Care | Payer: Medicare Other | Source: Ambulatory Visit | Attending: Neurology | Admitting: Neurology

## 2018-02-20 DIAGNOSIS — M48061 Spinal stenosis, lumbar region without neurogenic claudication: Secondary | ICD-10-CM | POA: Diagnosis not present

## 2018-02-20 DIAGNOSIS — R292 Abnormal reflex: Secondary | ICD-10-CM

## 2018-02-20 DIAGNOSIS — M48062 Spinal stenosis, lumbar region with neurogenic claudication: Secondary | ICD-10-CM

## 2018-02-21 ENCOUNTER — Encounter (HOSPITAL_COMMUNITY): Payer: Medicare Other

## 2018-02-21 ENCOUNTER — Telehealth: Payer: Self-pay | Admitting: Neurology

## 2018-02-21 ENCOUNTER — Encounter (HOSPITAL_COMMUNITY)
Admission: RE | Admit: 2018-02-21 | Discharge: 2018-02-21 | Disposition: A | Discharge status: Home / Self Care | Payer: Medicare Other | Source: Ambulatory Visit | Attending: Cardiology | Admitting: Cardiology

## 2018-02-21 DIAGNOSIS — I10 Essential (primary) hypertension: Secondary | ICD-10-CM | POA: Diagnosis not present

## 2018-02-21 DIAGNOSIS — I214 Non-ST elevation (NSTEMI) myocardial infarction: Secondary | ICD-10-CM

## 2018-02-21 DIAGNOSIS — Z79899 Other long term (current) drug therapy: Secondary | ICD-10-CM | POA: Diagnosis not present

## 2018-02-21 DIAGNOSIS — I2510 Atherosclerotic heart disease of native coronary artery without angina pectoris: Secondary | ICD-10-CM | POA: Diagnosis not present

## 2018-02-21 DIAGNOSIS — Z955 Presence of coronary angioplasty implant and graft: Secondary | ICD-10-CM

## 2018-02-21 DIAGNOSIS — Z7982 Long term (current) use of aspirin: Secondary | ICD-10-CM | POA: Diagnosis not present

## 2018-02-21 DIAGNOSIS — E785 Hyperlipidemia, unspecified: Secondary | ICD-10-CM | POA: Diagnosis not present

## 2018-02-21 NOTE — Telephone Encounter (Signed)
Called and discussed results of patient's MRI lumbar spine which showed multilevel degenerative changes with spinal canal and foraminal stenosis, which explains his leg symptoms of neurogenic claudication.  He is scheduled to see Dr. Sherley Bounds, neurosurgeon, on 6/24 at 1:30pm for evaluation.  Abed Schar K. Posey Pronto, DO

## 2018-02-24 ENCOUNTER — Encounter (HOSPITAL_COMMUNITY): Payer: Medicare Other

## 2018-02-24 ENCOUNTER — Encounter (HOSPITAL_COMMUNITY)
Admission: RE | Admit: 2018-02-24 | Discharge: 2018-02-24 | Disposition: A | Discharge status: Home / Self Care | Payer: Medicare Other | Source: Ambulatory Visit | Attending: Cardiology | Admitting: Cardiology

## 2018-02-24 DIAGNOSIS — I214 Non-ST elevation (NSTEMI) myocardial infarction: Secondary | ICD-10-CM

## 2018-02-24 DIAGNOSIS — Z955 Presence of coronary angioplasty implant and graft: Secondary | ICD-10-CM | POA: Diagnosis not present

## 2018-02-24 DIAGNOSIS — I10 Essential (primary) hypertension: Secondary | ICD-10-CM | POA: Diagnosis not present

## 2018-02-24 DIAGNOSIS — E785 Hyperlipidemia, unspecified: Secondary | ICD-10-CM | POA: Diagnosis not present

## 2018-02-24 DIAGNOSIS — Z79899 Other long term (current) drug therapy: Secondary | ICD-10-CM | POA: Diagnosis not present

## 2018-02-24 DIAGNOSIS — I2510 Atherosclerotic heart disease of native coronary artery without angina pectoris: Secondary | ICD-10-CM | POA: Diagnosis not present

## 2018-02-24 DIAGNOSIS — Z7982 Long term (current) use of aspirin: Secondary | ICD-10-CM | POA: Diagnosis not present

## 2018-02-24 NOTE — Progress Notes (Signed)
I have reviewed a Home Exercise Prescription with Noah Scott . Noah Scott is not currently exercising at home. The patient was advised to visit community gym 1-2 days a week for 30 minutes. Pt is experiencing claudication and was advised to find a mode of exercise in gym that would not increase pain.  Noah Scott and I discussed how to progress their exercise prescription. The patient stated that they understand the exercise prescription. We reviewed exercise guidelines, target heart rate during exercise, RPE Scale, weather conditions, NTG use, endpoints for exercise, warmup and cool down. Patient is encouraged to come to me with any questions. I will continue to follow up with the patient to assist them with progression and safety.      Noah Scott Noah Scott 3:03 PM 02/24/2018

## 2018-02-26 ENCOUNTER — Encounter (HOSPITAL_COMMUNITY): Payer: Medicare Other

## 2018-02-26 ENCOUNTER — Encounter (HOSPITAL_COMMUNITY)
Admission: RE | Admit: 2018-02-26 | Discharge: 2018-02-26 | Disposition: A | Discharge status: Home / Self Care | Payer: Medicare Other | Source: Ambulatory Visit | Attending: Cardiology | Admitting: Cardiology

## 2018-02-26 DIAGNOSIS — E785 Hyperlipidemia, unspecified: Secondary | ICD-10-CM | POA: Diagnosis not present

## 2018-02-26 DIAGNOSIS — I214 Non-ST elevation (NSTEMI) myocardial infarction: Secondary | ICD-10-CM

## 2018-02-26 DIAGNOSIS — Z79899 Other long term (current) drug therapy: Secondary | ICD-10-CM | POA: Diagnosis not present

## 2018-02-26 DIAGNOSIS — E538 Deficiency of other specified B group vitamins: Secondary | ICD-10-CM | POA: Diagnosis not present

## 2018-02-26 DIAGNOSIS — I2510 Atherosclerotic heart disease of native coronary artery without angina pectoris: Secondary | ICD-10-CM | POA: Diagnosis not present

## 2018-02-26 DIAGNOSIS — I10 Essential (primary) hypertension: Secondary | ICD-10-CM | POA: Diagnosis not present

## 2018-02-26 DIAGNOSIS — Z955 Presence of coronary angioplasty implant and graft: Secondary | ICD-10-CM

## 2018-02-26 DIAGNOSIS — Z7982 Long term (current) use of aspirin: Secondary | ICD-10-CM | POA: Diagnosis not present

## 2018-02-27 ENCOUNTER — Encounter (HOSPITAL_COMMUNITY): Payer: Self-pay

## 2018-02-27 NOTE — Progress Notes (Signed)
Cardiac Individual Treatment Plan  Patient Details  Name: Noah Scott MRN: [id number] Date of Birth: 09-19-1935 Referring Provider:     CARDIAC REHAB PHASE II ORIENTATION from 02/04/2018 in [facility]  Referring Provider  Glenetta Hew, MD.      Initial Encounter Date:    CARDIAC REHAB PHASE II ORIENTATION from 02/04/2018 in [facility]  Date  02/04/18  Referring Provider  Glenetta Hew, MD.      Visit Diagnosis: NSTEMI (non-ST elevated myocardial infarction) ([facility])  11/25/17 DES x3 RCA  Patient's Home Medications on Admission:  Current Outpatient Medications:  .  Aromatic Inhalants (VICKS VAPOR INHALER IN), Inhale 1 Inhaler into the lungs as needed (stuffy nose)., Disp: , Rfl:  .  aspirin EC 81 MG tablet, Take 81 mg by mouth daily., Disp: , Rfl:  .  doxazosin (CARDURA) 8 MG tablet, Take 8 mg by mouth daily., Disp: , Rfl:  .  ezetimibe (ZETIA) 10 MG tablet, Take 1 tablet (10 mg total) by mouth daily., Disp: 30 tablet, Rfl: 3 .  furosemide (LASIX) 20 MG tablet, Take 1 tablet (20 mg total) by mouth daily., Disp: 30 tablet, Rfl: 2 .  losartan (COZAAR) 50 MG tablet, Take 1 tablet (50 mg total) by mouth daily., Disp: 30 tablet, Rfl: 11 .  metoprolol tartrate (LOPRESSOR) 25 MG tablet, Take 0.5 tablets (12.5 mg total) by mouth 2 (two) times daily., Disp: 30 tablet, Rfl: 3 .  nitroGLYCERIN (NITROSTAT) 0.4 MG SL tablet, Place 1 tablet (0.4 mg total) under the tongue every 5 (five) minutes as needed for chest pain., Disp: 25 tablet, Rfl: 3 .  pantoprazole (PROTONIX) 40 MG tablet, Take 1 tablet (40 mg total) by mouth daily., Disp: 30 tablet, Rfl: 3 .  potassium chloride SA (K-DUR,KLOR-CON) 20 MEQ tablet, Take 1 tablet (20 mEq total) by mouth daily., Disp: 30 tablet, Rfl: 11 .  rosuvastatin (CRESTOR) 40 MG tablet, Take 1 tablet (40 mg total) by mouth daily., Disp: 30 tablet, Rfl: 3 .  ticagrelor (BRILINTA) 90 MG TABS tablet, Take 1  tablet (90 mg total) by mouth 2 (two) times daily., Disp: 60 tablet, Rfl: 3 .  tizanidine (ZANAFLEX) 2 MG capsule, Take 1 capsule (2 mg total) by mouth at bedtime. (Patient not taking: Reported on 02/12/2018), Disp: 30 capsule, Rfl: 5  Past Medical History: Past Medical History:  Diagnosis Date  . CAD (coronary artery disease)   . HTN (hypertension)   . Hyperlipemia     Tobacco Use: Social History   Tobacco Use  Smoking Status Never Smoker  Smokeless Tobacco Never Used    Labs: Recent Review Flowsheet Data    There is no flowsheet data to display.      Capillary Blood Glucose: No results found for: GLUCAP   Exercise Target Goals:    Exercise Program Goal: Individual exercise prescription set using results from initial 6 min walk test and THRR while considering  patient's activity barriers and safety.   Exercise Prescription Goal: Initial exercise prescription builds to 30-45 minutes a day of aerobic activity, 2-3 days per week.  Home exercise guidelines will be given to patient during program as part of exercise prescription that the participant will acknowledge.  Activity Barriers & Risk Stratification: Activity Barriers & Cardiac Risk Stratification - 02/04/18 0740      Activity Barriers & Cardiac Risk Stratification   Activity Barriers  Back Problems;Shortness of Breath Chronic lower back pain that radiates to both legs.  Cardiac Risk Stratification  High       6 Minute Walk: 6 Minute Walk    Row Name 02/04/18 0920         6 Minute Walk   Phase  Initial     Distance  1000 feet     Walk Time  6 minutes Stopped at 5:30 due to back/leg pain     # of Rest Breaks  1 30 seconds from 5:30 to 6:00     MPH  1.89     METS  1.63     RPE  13     Perceived Dyspnea   2     VO2 Peak  5.72     Symptoms  Yes (comment)     Comments  Patient c/o SOB during walk test. Pt stopped at 5:30 due to lower back and leg pain, which he rated 7/10 on the pain scale.     Resting  HR  85 bpm     Resting BP  114/60     Resting Oxygen Saturation   98 %     Exercise Oxygen Saturation  during 6 min walk  97 %     Max Ex. HR  122 bpm     Max Ex. BP  126/70     2 Minute Post BP  114/70        Oxygen Initial Assessment:   Oxygen Re-Evaluation:   Oxygen Discharge (Final Oxygen Re-Evaluation):   Initial Exercise Prescription: Initial Exercise Prescription - 02/04/18 1000      Date of Initial Exercise RX and Referring Provider   Date  02/04/18    Referring Provider  Glenetta Hew, MD.      Recumbant Bike   Level  1    Watts  15    Minutes  10    METs  2.45      NuStep   Level  1    SPM  85    Minutes  10    METs  2      Arm Ergometer   Level  1    Watts  20    Minutes  5    METs  2      Track   Laps  5    Minutes  5    METs  2.74      Prescription Details   Frequency (times per week)  3    Duration  Progress to 30 minutes of continuous aerobic without signs/symptoms of physical distress      Intensity   THRR 40-80% of Max Heartrate  56-113    Ratings of Perceived Exertion  11-13    Perceived Dyspnea  0-4      Progression   Progression  Continue to progress workloads to maintain intensity without signs/symptoms of physical distress.      Resistance Training   Training Prescription  Yes    Weight  2lbs    Reps  10-15       Perform Capillary Blood Glucose checks as needed.  Exercise Prescription Changes: Exercise Prescription Changes    Row Name 02/12/18 1400 02/17/18 1347 02/24/18 1400         Response to Exercise   Blood Pressure (Admit)  110/70  104/62  122/70     Blood Pressure (Exercise)  152/70  124/84  138/70     Blood Pressure (Exit)  118/68  100/64  96/60     Heart Rate (Admit)  71 bpm  86 bpm  78 bpm     Heart Rate (Exercise)  117 bpm  113 bpm  101 bpm     Heart Rate (Exit)  81 bpm  86 bpm  72 bpm     Rating of Perceived Exertion (Exercise)  11  12  11      Perceived Dyspnea (Exercise)  0  0  0     Symptoms   None  None   None     Comments  Pt oriented to exercise equipment  -  -     Duration  Progress to 30 minutes of  aerobic without signs/symptoms of physical distress  Continue with 30 min of aerobic exercise without signs/symptoms of physical distress.  Continue with 30 min of aerobic exercise without signs/symptoms of physical distress.     Intensity  THRR New  THRR unchanged  THRR unchanged       Progression   Progression  Continue to progress workloads to maintain intensity without signs/symptoms of physical distress.  Continue to progress workloads to maintain intensity without signs/symptoms of physical distress.  Continue to progress workloads to maintain intensity without signs/symptoms of physical distress.     Average METs  2.5  2.78  2.65       Resistance Training   Training Prescription  No  Yes  Yes     Weight  -  2lbs  2lbs     Reps  -  10-15  10-15     Time  -  10 Minutes  10 Minutes       Interval Training   Interval Training  -  No  No       Recumbant Bike   Level  1  2  2      Watts  21  15  32     Minutes  15  10  10      METs  2.6  2.5  2.9       NuStep   Level  1  2  2      SPM  85  85  85     Minutes  15  10  10      METs  1  2.1  2.4       Arm Ergometer   Level  -  1  1     Watts  -  48  10     Minutes  -  10  5     METs  -  2.1  1.5       Track   Laps  -  -  4     Minutes  -  -  5     METs  -  -  1.7       Home Exercise Plan   Plans to continue exercise at  -  -  Home (comment)     Frequency  -  -  Add 2 additional days to program exercise sessions.     Initial Home Exercises Provided  -  -  02/24/18        Exercise Comments: Exercise Comments    Row Name 02/12/18 1412 02/24/18 1435         Exercise Comments  Today was pt's first day of exercise. Pt oriented to exercise equipment. Will continue to monitor and progress pt.   Reviewed HEP with pt. Pt is not currently exercising at home, but has access to gym and pool. Will continue to monitor and  progress pt as tolerated.          Exercise Goals and Review: Exercise Goals    Row Name 02/04/18 0958             Exercise Goals   Increase Physical Activity  Yes       Intervention  Provide advice, education, support and counseling about physical activity/exercise needs.;Develop an individualized exercise prescription for aerobic and resistive training based on initial evaluation findings, risk stratification, comorbidities and participant's personal goals.       Expected Outcomes  Short Term: Attend rehab on a regular basis to increase amount of physical activity.;Long Term: Exercising regularly at least 3-5 days a week.;Long Term: Add in home exercise to make exercise part of routine and to increase amount of physical activity.       Increase Strength and Stamina  Yes       Intervention  Develop an individualized exercise prescription for aerobic and resistive training based on initial evaluation findings, risk stratification, comorbidities and participant's personal goals.       Expected Outcomes  Short Term: Increase workloads from initial exercise prescription for resistance, speed, and METs.;Short Term: Perform resistance training exercises routinely during rehab and add in resistance training at home;Long Term: Improve cardiorespiratory fitness, muscular endurance and strength as measured by increased METs and functional capacity (6MWT)       Able to understand and use rate of perceived exertion (RPE) scale  Yes       Intervention  Provide education and explanation on how to use RPE scale       Expected Outcomes  Short Term: Able to use RPE daily in rehab to express subjective intensity level;Long Term:  Able to use RPE to guide intensity level when exercising independently       Knowledge and understanding of Target Heart Rate Range (THRR)  Yes       Intervention  Provide education and explanation of THRR including how the numbers were predicted and where they are located for  reference       Expected Outcomes  Short Term: Able to state/look up THRR;Long Term: Able to use THRR to govern intensity when exercising independently;Short Term: Able to use daily as guideline for intensity in rehab       Able to check pulse independently  Yes       Intervention  Provide education and demonstration on how to check pulse in carotid and radial arteries.;Review the importance of being able to check your own pulse for safety during independent exercise       Expected Outcomes  Short Term: Able to explain why pulse checking is important during independent exercise;Long Term: Able to check pulse independently and accurately       Understanding of Exercise Prescription  Yes       Intervention  Provide education, explanation, and written materials on patient's individual exercise prescription       Expected Outcomes  Short Term: Able to explain program exercise prescription;Long Term: Able to explain home exercise prescription to exercise independently          Exercise Goals Re-Evaluation : Exercise Goals Re-Evaluation    Row Name 02/24/18 1439             Exercise Goal Re-Evaluation   Exercise Goals Review  Increase Physical Activity;Increase Strength and Stamina;Able to understand and use rate of perceived exertion (RPE) scale;Knowledge and understanding of Target Heart Rate Range (THRR);Able to check pulse independently;Understanding of Exercise  Prescription       Comments  Reviewed HEP with pt. Also reviewed THRR, RPE Scale, weather precautions, NTG use, endpoints of exercise, warmup and cool down. Pt has limitations with leg pain. Pt has follow up with Dr. regarding claudication. Pt advised to work within his limits.         Expected Outcomes  Pt will exercise 1-2 days at home. Pt has a community gym and pool. Pt will work with limitations. Will continue to monitor pt.            Discharge Exercise Prescription (Final Exercise Prescription Changes): Exercise Prescription  Changes - 02/24/18 1400      Response to Exercise   Blood Pressure (Admit)  122/70    Blood Pressure (Exercise)  138/70    Blood Pressure (Exit)  96/60    Heart Rate (Admit)  78 bpm    Heart Rate (Exercise)  101 bpm    Heart Rate (Exit)  72 bpm    Rating of Perceived Exertion (Exercise)  11    Perceived Dyspnea (Exercise)  0    Symptoms  None    Duration  Continue with 30 min of aerobic exercise without signs/symptoms of physical distress.    Intensity  THRR unchanged      Progression   Progression  Continue to progress workloads to maintain intensity without signs/symptoms of physical distress.    Average METs  2.65      Resistance Training   Training Prescription  Yes    Weight  2lbs    Reps  10-15    Time  10 Minutes      Interval Training   Interval Training  No      Recumbant Bike   Level  2    Watts  32    Minutes  10    METs  2.9      NuStep   Level  2    SPM  85    Minutes  10    METs  2.4      Arm Ergometer   Level  1    Watts  10    Minutes  5    METs  1.5      Track   Laps  4    Minutes  5    METs  1.7      Home Exercise Plan   Plans to continue exercise at  Home (comment)    Frequency  Add 2 additional days to program exercise sessions.    Initial Home Exercises Provided  02/24/18       Nutrition:  Target Goals: Understanding of nutrition guidelines, daily intake of sodium 1500mg , cholesterol 200mg , calories 30% from fat and 7% or less from saturated fats, daily to have 5 or more servings of fruits and vegetables.  Biometrics: Pre Biometrics - 02/04/18 0740      Pre Biometrics   Height  5\' 8"  (1.727 m)    Weight  229 lb 4.5 oz (104 kg)    Waist Circumference  45 inches    Hip Circumference  44.25 inches    Waist to Hip Ratio  1.02 %    BMI (Calculated)  34.87    Triceps Skinfold  13 mm    % Body Fat  32.4 %    Grip Strength  38.5 kg    Flexibility  0 in Wearing back brace.    Single Leg Stand  4.69 seconds  Nutrition  Therapy Plan and Nutrition Goals: Nutrition Therapy & Goals - 02/05/18 1611      Nutrition Therapy   Diet  Heart Healthy      Intervention Plan   Intervention  Prescribe, educate and counsel regarding individualized specific dietary modifications aiming towards targeted core components such as weight, hypertension, lipid management, diabetes, heart failure and other comorbidities.    Expected Outcomes  Short Term Goal: Understand basic principles of dietary content, such as calories, fat, sodium, cholesterol and nutrients.;Long Term Goal: Adherence to prescribed nutrition plan.       Nutrition Assessments: Nutrition Assessments - 02/05/18 1611      MEDFICTS Scores   Pre Score  42       Nutrition Goals Re-Evaluation:   Nutrition Goals Re-Evaluation:   Nutrition Goals Discharge (Final Nutrition Goals Re-Evaluation):   Psychosocial: Target Goals: Acknowledge presence or absence of significant depression and/or stress, maximize coping skills, provide positive support system. Participant is able to verbalize types and ability to use techniques and skills needed for reducing stress and depression.  Initial Review & Psychosocial Screening: Initial Psych Review & Screening - 02/04/18 0749      Initial Review   Current issues with  None Identified      Family Dynamics   Good Support System?  Yes daughter, grandchildren    Comments  moved to [place] from [place] when granddaughter started [employer]      Barriers   Psychosocial barriers to participate in program  There are no identifiable barriers or psychosocial needs.      Screening Interventions   Interventions  Encouraged to exercise       Quality of Life Scores: Quality of Life - 02/04/18 0738      Quality of Life Scores   Health/Function Pre  22.8 %    Socioeconomic Pre  24.64 %    Psych/Spiritual Pre  25.5 %    Family Pre  28.5 %    GLOBAL Pre  24.45 %      Scores of 19 and below usually indicate a poorer  quality of life in these areas.  A difference of  2-3 points is a clinically meaningful difference.  A difference of 2-3 points in the total score of the Quality of Life Index has been associated with significant improvement in overall quality of life, self-image, physical symptoms, and general health in studies assessing change in quality of life.  PHQ-9: Recent Review Flowsheet Data    Depression screen [facility] 2/9 02/12/2018   Decreased Interest 0   Down, Depressed, Hopeless 0   PHQ - 2 Score 0     Interpretation of Total Score  Total Score Depression Severity:  1-4 = Minimal depression, 5-9 = Mild depression, 10-14 = Moderate depression, 15-19 = Moderately severe depression, 20-27 = Severe depression   Psychosocial Evaluation and Intervention: Psychosocial Evaluation - 02/12/18 1657      Psychosocial Evaluation & Interventions   Interventions  Encouraged to exercise with the program and follow exercise prescription    Comments  No psychosocial needs identified. No interventions necessary. Pt enjoys visiting his daughter everyday.     Expected Outcomes  Pt will exhibit a positive outlook with good coping skills.     Continue Psychosocial Services   No Follow up required       Psychosocial Re-Evaluation: Psychosocial Re-Evaluation    D[place] Name 02/27/18 0736             Psychosocial Re-Evaluation  Current issues with  None Identified       Comments  No psychosocial needs identified. No intervention necessary.       Expected Outcomes  Ranbir will continue to exhibit a positive outlook utilizing good coping skills.       Interventions  Encouraged to attend Cardiac Rehabilitation for the exercise       Continue Psychosocial Services   No Follow up required          Psychosocial Discharge (Final Psychosocial Re-Evaluation): Psychosocial Re-Evaluation - 02/27/18 0736      Psychosocial Re-Evaluation   Current issues with  None Identified    Comments  No psychosocial needs  identified. No intervention necessary.    Expected Outcomes  Andri will continue to exhibit a positive outlook utilizing good coping skills.    Interventions  Encouraged to attend Cardiac Rehabilitation for the exercise    Continue Psychosocial Services   No Follow up required       Vocational Rehabilitation: Provide vocational rehab assistance to qualifying candidates.   Vocational Rehab Evaluation & Intervention: Vocational Rehab - 02/04/18 0749      Initial Vocational Rehab Evaluation & Intervention   Assessment shows need for Vocational Rehabilitation  No retired        Education: Education Goals: Education classes will be provided on a weekly basis, covering required topics. Participant will state understanding/return demonstration of topics presented.  Learning Barriers/Preferences: Learning Barriers/Preferences - 02/04/18 1000      Learning Barriers/Preferences   Learning Barriers  Sight    Learning Preferences  Skilled Demonstration;Written Material       Education Topics: Count Your Pulse:  -Group instruction provided by verbal instruction, demonstration, patient participation and written materials to support subject.  Instructors address importance of being able to find your pulse and how to count your pulse when at home without a heart monitor.  Patients get hands on experience counting their pulse with staff help and individually.   Heart Attack, Angina, and Risk Factor Modification:  -Group instruction provided by verbal instruction, video, and written materials to support subject.  Instructors address signs and symptoms of angina and heart attacks.    Also discuss risk factors for heart disease and how to make changes to improve heart health risk factors.   Functional Fitness:  -Group instruction provided by verbal instruction, demonstration, patient participation, and written materials to support subject.  Instructors address safety measures for doing things  around the house.  Discuss how to get up and down off the floor, how to pick things up properly, how to safely get out of a chair without assistance, and balance training.   Meditation and Mindfulness:  -Group instruction provided by verbal instruction, patient participation, and written materials to support subject.  Instructor addresses importance of mindfulness and meditation practice to help reduce stress and improve awareness.  Instructor also leads participants through a meditation exercise.    Stretching for Flexibility and Mobility:  -Group instruction provided by verbal instruction, patient participation, and written materials to support subject.  Instructors lead participants through series of stretches that are designed to increase flexibility thus improving mobility.  These stretches are additional exercise for major muscle groups that are typically performed during regular warm up and cool down.   Hands Only CPR:  -Group verbal, video, and participation provides a basic overview of AHA guidelines for community CPR. Role-play of emergencies allow participants the opportunity to practice calling for help and chest compression technique with discussion of  AED use.   Hypertension: -Group verbal and written instruction that provides a basic overview of hypertension including the most recent diagnostic guidelines, risk factor reduction with self-care instructions and medication management.    Nutrition I class: Heart Healthy Eating:  -Group instruction provided by PowerPoint slides, verbal discussion, and written materials to support subject matter. The instructor gives an explanation and review of the Therapeutic Lifestyle Changes diet recommendations, which includes a discussion on lipid goals, dietary fat, sodium, fiber, plant stanol/sterol esters, sugar, and the components of a well-balanced, healthy diet.   Nutrition II class: Lifestyle Skills:  -Group instruction provided by  PowerPoint slides, verbal discussion, and written materials to support subject matter. The instructor gives an explanation and review of label reading, grocery shopping for heart health, heart healthy recipe modifications, and ways to make healthier choices when eating out.   Diabetes Question & Answer:  -Group instruction provided by PowerPoint slides, verbal discussion, and written materials to support subject matter. The instructor gives an explanation and review of diabetes co-morbidities, pre- and post-prandial blood glucose goals, pre-exercise blood glucose goals, signs, symptoms, and treatment of hypoglycemia and hyperglycemia, and foot care basics.   Diabetes Blitz:  -Group instruction provided by PowerPoint slides, verbal discussion, and written materials to support subject matter. The instructor gives an explanation and review of the physiology behind type 1 and type 2 diabetes, diabetes medications and rational behind using different medications, pre- and post-prandial blood glucose recommendations and Hemoglobin A1c goals, diabetes diet, and exercise including blood glucose guidelines for exercising safely.    Portion Distortion:  -Group instruction provided by PowerPoint slides, verbal discussion, written materials, and food models to support subject matter. The instructor gives an explanation of serving size versus portion size, changes in portions sizes over the last 20 years, and what consists of a serving from each food group.   Stress Management:  -Group instruction provided by verbal instruction, video, and written materials to support subject matter.  Instructors review role of stress in heart disease and how to cope with stress positively.     CARDIAC REHAB PHASE II EXERCISE from 02/26/2018 in [place]  Date  02/12/18  Instruction Review Code  2- Demonstrated Understanding      Exercising on Your Own:  -Group instruction provided by verbal  instruction, power point, and written materials to support subject.  Instructors discuss benefits of exercise, components of exercise, frequency and intensity of exercise, and end points for exercise.  Also discuss use of nitroglycerin and activating EMS.  Review options of places to exercise outside of rehab.  Review guidelines for sex with heart disease.   Cardiac Drugs I:  -Group instruction provided by verbal instruction and written materials to support subject.  Instructor reviews cardiac drug classes: antiplatelets, anticoagulants, beta blockers, and statins.  Instructor discusses reasons, side effects, and lifestyle considerations for each drug class.   Cardiac Drugs II:  -Group instruction provided by verbal instruction and written materials to support subject.  Instructor reviews cardiac drug classes: angiotensin converting enzyme inhibitors (ACE-I), angiotensin II receptor blockers (ARBs), nitrates, and calcium channel blockers.  Instructor discusses reasons, side effects, and lifestyle considerations for each drug class.   CARDIAC REHAB PHASE II EXERCISE from 02/26/2018 in [place]  Date  02/26/18  Instruction Review Code  2- Demonstrated Understanding      Anatomy and Physiology of the Circulatory System:  Group verbal and written instruction and models provide basic  cardiac anatomy and physiology, with the coronary electrical and arterial systems. Review of: AMI, Angina, Valve disease, Heart Failure, Peripheral Artery Disease, Cardiac Arrhythmia, Pacemakers, and the ICD.   CARDIAC REHAB PHASE II EXERCISE from 02/26/2018 in [place]  Date  02/19/18  Instruction Review Code  2- Demonstrated Understanding      Other Education:  -Group or individual verbal, written, or video instructions that support the educational goals of the cardiac rehab program.   Holiday Eating Survival Tips:  -Group instruction provided by  PowerPoint slides, verbal discussion, and written materials to support subject matter. The instructor gives patients tips, tricks, and techniques to help them not only survive but enjoy the holidays despite the onslaught of food that accompanies the holidays.   Knowledge Questionnaire Score: Knowledge Questionnaire Score - 02/04/18 0737      Knowledge Questionnaire Score   Pre Score  18/24       Core Components/Risk Factors/Patient Goals at Admission: Personal Goals and Risk Factors at Admission - 02/04/18 0955      Core Components/Risk Factors/Patient Goals on Admission    Weight Management  Yes;Obesity    Intervention  Weight Management: Develop a combined nutrition and exercise program designed to reach desired caloric intake, while maintaining appropriate intake of nutrient and fiber, sodium and fats, and appropriate energy expenditure required for the weight goal.;Weight Management: Provide education and appropriate resources to help participant work on and attain dietary goals.;Weight Management/Obesity: Establish reasonable short term and long term weight goals.;Obesity: Provide education and appropriate resources to help participant work on and attain dietary goals.    Admit Weight  229 lb 4.5 oz (104 kg)    Goal Weight: Short Term  223 lb (101.2 kg)    Goal Weight: Long Term  199 lb (90.3 kg)    Expected Outcomes  Short Term: Continue to assess and modify interventions until short term weight is achieved;Long Term: Adherence to nutrition and physical activity/exercise program aimed toward attainment of established weight goal;Weight Loss: Understanding of general recommendations for a balanced deficit meal plan, which promotes 1-2 lb weight loss per week and includes a negative energy balance of [phone] kcal/d;Understanding recommendations for meals to include 15-35% energy as protein, 25-35% energy from fat, 35-60% energy from carbohydrates, less than 200mg  of dietary cholesterol,  20-35 gm of total fiber daily;Understanding of distribution of calorie intake throughout the day with the consumption of 4-5 meals/snacks    Hypertension  Yes    Intervention  Provide education on lifestyle modifcations including regular physical activity/exercise, weight management, moderate sodium restriction and increased consumption of fresh fruit, vegetables, and low fat dairy, alcohol moderation, and smoking cessation.;Monitor prescription use compliance.    Expected Outcomes  Short Term: Continued assessment and intervention until BP is < 140/78mm HG in hypertensive participants. < 130/47mm HG in hypertensive participants with diabetes, heart failure or chronic kidney disease.;Long Term: Maintenance of blood pressure at goal levels.    Lipids  Yes    Intervention  Provide education and support for participant on nutrition & aerobic/resistive exercise along with prescribed medications to achieve LDL 70mg , HDL >40mg .    Expected Outcomes  Short Term: Participant states understanding of desired cholesterol values and is compliant with medications prescribed. Participant is following exercise prescription and nutrition guidelines.;Long Term: Cholesterol controlled with medications as prescribed, with individualized exercise RX and with personalized nutrition plan. Value goals: LDL < 70mg , HDL > 40 mg.       Core Components/Risk  Factors/Patient Goals Review:  Goals and Risk Factor Review    Row Name 02/12/18 1655 02/27/18 0736           Core Components/Risk Factors/Patient Goals Review   Personal Goals Review  Weight Management/Obesity;Lipids;Hypertension  Weight Management/Obesity;Lipids;Hypertension      Review  Pt with multiple CAD RF eager to participate in CR exercise. Pt is looking forward to gaining comfort with daily activties.   Pt with multiple CAD RF eager to participate in CR exercise. Tallie is tolerating exercise well thus far in the program.       Expected Outcomes  Pt will  participate in CR exercise, nutrition, and lifestyle modification.   Pt will participate in CR exercise, nutrition, and lifestyle modification.          Core Components/Risk Factors/Patient Goals at Discharge (Final Review):  Goals and Risk Factor Review - 02/27/18 0736      Core Components/Risk Factors/Patient Goals Review   Personal Goals Review  Weight Management/Obesity;Lipids;Hypertension    Review  Pt with multiple CAD RF eager to participate in CR exercise. Britt is tolerating exercise well thus far in the program.     Expected Outcomes  Pt will participate in CR exercise, nutrition, and lifestyle modification.        ITP Comments: ITP Comments    Row Name 02/04/18 0735 02/27/18 0731         ITP Comments  Medical Director- Dr. Fransico Him, MD  30 Day ITP Review. Pt is off to a good start with exercise.  He is attending education session as well to reduce his risk.         Comments: See ITP Comments

## 2018-02-28 ENCOUNTER — Encounter (HOSPITAL_COMMUNITY): Payer: Medicare Other

## 2018-02-28 ENCOUNTER — Encounter (HOSPITAL_COMMUNITY)
Admission: RE | Admit: 2018-02-28 | Discharge: 2018-02-28 | Disposition: A | Discharge status: Home / Self Care | Payer: Medicare Other | Source: Ambulatory Visit | Attending: Cardiology | Admitting: Cardiology

## 2018-02-28 DIAGNOSIS — I10 Essential (primary) hypertension: Secondary | ICD-10-CM | POA: Diagnosis not present

## 2018-02-28 DIAGNOSIS — I214 Non-ST elevation (NSTEMI) myocardial infarction: Secondary | ICD-10-CM

## 2018-02-28 DIAGNOSIS — I2510 Atherosclerotic heart disease of native coronary artery without angina pectoris: Secondary | ICD-10-CM | POA: Diagnosis not present

## 2018-02-28 DIAGNOSIS — Z955 Presence of coronary angioplasty implant and graft: Secondary | ICD-10-CM | POA: Diagnosis not present

## 2018-02-28 DIAGNOSIS — Z7982 Long term (current) use of aspirin: Secondary | ICD-10-CM | POA: Diagnosis not present

## 2018-02-28 DIAGNOSIS — E785 Hyperlipidemia, unspecified: Secondary | ICD-10-CM | POA: Diagnosis not present

## 2018-02-28 DIAGNOSIS — Z79899 Other long term (current) drug therapy: Secondary | ICD-10-CM | POA: Diagnosis not present

## 2018-03-03 ENCOUNTER — Encounter (HOSPITAL_COMMUNITY): Payer: Medicare Other

## 2018-03-03 ENCOUNTER — Encounter (HOSPITAL_COMMUNITY)
Admission: RE | Admit: 2018-03-03 | Discharge: 2018-03-03 | Disposition: A | Discharge status: Home / Self Care | Payer: Medicare Other | Source: Ambulatory Visit | Attending: Cardiology | Admitting: Cardiology

## 2018-03-03 DIAGNOSIS — I10 Essential (primary) hypertension: Secondary | ICD-10-CM | POA: Diagnosis not present

## 2018-03-03 DIAGNOSIS — E785 Hyperlipidemia, unspecified: Secondary | ICD-10-CM | POA: Diagnosis not present

## 2018-03-03 DIAGNOSIS — Z7982 Long term (current) use of aspirin: Secondary | ICD-10-CM | POA: Diagnosis not present

## 2018-03-03 DIAGNOSIS — I214 Non-ST elevation (NSTEMI) myocardial infarction: Secondary | ICD-10-CM

## 2018-03-03 DIAGNOSIS — Z955 Presence of coronary angioplasty implant and graft: Secondary | ICD-10-CM | POA: Diagnosis not present

## 2018-03-03 DIAGNOSIS — I2510 Atherosclerotic heart disease of native coronary artery without angina pectoris: Secondary | ICD-10-CM | POA: Diagnosis not present

## 2018-03-03 DIAGNOSIS — Z79899 Other long term (current) drug therapy: Secondary | ICD-10-CM | POA: Diagnosis not present

## 2018-03-05 ENCOUNTER — Encounter (HOSPITAL_COMMUNITY)
Admission: RE | Admit: 2018-03-05 | Discharge: 2018-03-05 | Disposition: A | Discharge status: Home / Self Care | Payer: Medicare Other | Source: Ambulatory Visit | Attending: Cardiology | Admitting: Cardiology

## 2018-03-05 ENCOUNTER — Encounter (HOSPITAL_COMMUNITY): Payer: Medicare Other

## 2018-03-05 DIAGNOSIS — I2510 Atherosclerotic heart disease of native coronary artery without angina pectoris: Secondary | ICD-10-CM | POA: Diagnosis not present

## 2018-03-05 DIAGNOSIS — I10 Essential (primary) hypertension: Secondary | ICD-10-CM | POA: Diagnosis not present

## 2018-03-05 DIAGNOSIS — E785 Hyperlipidemia, unspecified: Secondary | ICD-10-CM | POA: Diagnosis not present

## 2018-03-05 DIAGNOSIS — Z7982 Long term (current) use of aspirin: Secondary | ICD-10-CM | POA: Diagnosis not present

## 2018-03-05 DIAGNOSIS — Z79899 Other long term (current) drug therapy: Secondary | ICD-10-CM | POA: Diagnosis not present

## 2018-03-05 DIAGNOSIS — I214 Non-ST elevation (NSTEMI) myocardial infarction: Secondary | ICD-10-CM

## 2018-03-05 DIAGNOSIS — E538 Deficiency of other specified B group vitamins: Secondary | ICD-10-CM | POA: Diagnosis not present

## 2018-03-05 DIAGNOSIS — Z955 Presence of coronary angioplasty implant and graft: Secondary | ICD-10-CM | POA: Diagnosis not present

## 2018-03-07 ENCOUNTER — Encounter (HOSPITAL_COMMUNITY): Payer: Medicare Other

## 2018-03-07 ENCOUNTER — Encounter (HOSPITAL_COMMUNITY)
Admission: RE | Admit: 2018-03-07 | Discharge: 2018-03-07 | Disposition: A | Discharge status: Home / Self Care | Payer: Medicare Other | Source: Ambulatory Visit | Attending: Cardiology | Admitting: Cardiology

## 2018-03-07 DIAGNOSIS — I2510 Atherosclerotic heart disease of native coronary artery without angina pectoris: Secondary | ICD-10-CM | POA: Diagnosis not present

## 2018-03-07 DIAGNOSIS — Z955 Presence of coronary angioplasty implant and graft: Secondary | ICD-10-CM | POA: Diagnosis not present

## 2018-03-07 DIAGNOSIS — I214 Non-ST elevation (NSTEMI) myocardial infarction: Secondary | ICD-10-CM

## 2018-03-07 DIAGNOSIS — Z79899 Other long term (current) drug therapy: Secondary | ICD-10-CM | POA: Diagnosis not present

## 2018-03-07 DIAGNOSIS — I10 Essential (primary) hypertension: Secondary | ICD-10-CM | POA: Diagnosis not present

## 2018-03-07 DIAGNOSIS — Z7982 Long term (current) use of aspirin: Secondary | ICD-10-CM | POA: Diagnosis not present

## 2018-03-07 DIAGNOSIS — E785 Hyperlipidemia, unspecified: Secondary | ICD-10-CM | POA: Diagnosis not present

## 2018-03-10 ENCOUNTER — Encounter (HOSPITAL_COMMUNITY): Payer: Medicare Other

## 2018-03-10 ENCOUNTER — Encounter (HOSPITAL_COMMUNITY)
Admission: RE | Admit: 2018-03-10 | Discharge: 2018-03-10 | Disposition: A | Discharge status: Home / Self Care | Payer: Medicare Other | Source: Ambulatory Visit | Attending: Cardiology | Admitting: Cardiology

## 2018-03-10 DIAGNOSIS — Z79899 Other long term (current) drug therapy: Secondary | ICD-10-CM | POA: Diagnosis not present

## 2018-03-10 DIAGNOSIS — I2510 Atherosclerotic heart disease of native coronary artery without angina pectoris: Secondary | ICD-10-CM | POA: Diagnosis not present

## 2018-03-10 DIAGNOSIS — M545 Low back pain: Secondary | ICD-10-CM | POA: Diagnosis not present

## 2018-03-10 DIAGNOSIS — M48062 Spinal stenosis, lumbar region with neurogenic claudication: Secondary | ICD-10-CM | POA: Diagnosis not present

## 2018-03-10 DIAGNOSIS — E785 Hyperlipidemia, unspecified: Secondary | ICD-10-CM | POA: Diagnosis not present

## 2018-03-10 DIAGNOSIS — Z955 Presence of coronary angioplasty implant and graft: Secondary | ICD-10-CM | POA: Diagnosis not present

## 2018-03-10 DIAGNOSIS — I10 Essential (primary) hypertension: Secondary | ICD-10-CM | POA: Diagnosis not present

## 2018-03-10 DIAGNOSIS — I214 Non-ST elevation (NSTEMI) myocardial infarction: Secondary | ICD-10-CM

## 2018-03-10 DIAGNOSIS — Z7982 Long term (current) use of aspirin: Secondary | ICD-10-CM | POA: Diagnosis not present

## 2018-03-10 DIAGNOSIS — Z6832 Body mass index (BMI) 32.0-32.9, adult: Secondary | ICD-10-CM | POA: Diagnosis not present

## 2018-03-12 ENCOUNTER — Encounter (HOSPITAL_COMMUNITY): Payer: Medicare Other

## 2018-03-12 ENCOUNTER — Ambulatory Visit (INDEPENDENT_AMBULATORY_CARE_PROVIDER_SITE_OTHER): Payer: Medicare Other | Admitting: Cardiology

## 2018-03-12 ENCOUNTER — Encounter: Payer: Self-pay | Admitting: Cardiology

## 2018-03-12 ENCOUNTER — Encounter (HOSPITAL_COMMUNITY)
Admission: RE | Admit: 2018-03-12 | Discharge: 2018-03-12 | Disposition: A | Discharge status: Home / Self Care | Payer: Medicare Other | Source: Ambulatory Visit | Attending: Cardiology | Admitting: Cardiology

## 2018-03-12 VITALS — BP 118/74 | HR 80 | Ht 70.0 in | Wt 224.5 lb

## 2018-03-12 DIAGNOSIS — Z9861 Coronary angioplasty status: Secondary | ICD-10-CM | POA: Diagnosis not present

## 2018-03-12 DIAGNOSIS — Z955 Presence of coronary angioplasty implant and graft: Secondary | ICD-10-CM | POA: Diagnosis not present

## 2018-03-12 DIAGNOSIS — Z79899 Other long term (current) drug therapy: Secondary | ICD-10-CM | POA: Diagnosis not present

## 2018-03-12 DIAGNOSIS — I2510 Atherosclerotic heart disease of native coronary artery without angina pectoris: Secondary | ICD-10-CM

## 2018-03-12 DIAGNOSIS — I10 Essential (primary) hypertension: Secondary | ICD-10-CM

## 2018-03-12 DIAGNOSIS — Z01818 Encounter for other preprocedural examination: Secondary | ICD-10-CM

## 2018-03-12 DIAGNOSIS — E8881 Metabolic syndrome: Secondary | ICD-10-CM | POA: Diagnosis not present

## 2018-03-12 DIAGNOSIS — Z7982 Long term (current) use of aspirin: Secondary | ICD-10-CM | POA: Diagnosis not present

## 2018-03-12 DIAGNOSIS — I214 Non-ST elevation (NSTEMI) myocardial infarction: Secondary | ICD-10-CM

## 2018-03-12 DIAGNOSIS — E669 Obesity, unspecified: Secondary | ICD-10-CM | POA: Diagnosis not present

## 2018-03-12 DIAGNOSIS — E785 Hyperlipidemia, unspecified: Secondary | ICD-10-CM

## 2018-03-12 DIAGNOSIS — E538 Deficiency of other specified B group vitamins: Secondary | ICD-10-CM | POA: Diagnosis not present

## 2018-03-12 NOTE — Progress Notes (Signed)
PCP: Patient, No Pcp Per  [facility], [employer].  Clinic Note: Chief Complaint  Patient presents with  . Follow-up    recent NSTEMI - PCI  . Back Pain    saw [facility] (Dr. Waynard Reeds) yesterday due to having back radiating to legs.  sent message about brilinta. would like rx for bp machine    . Coronary Artery Disease    No angina    HPI: Noah Scott is a 82 y.o. male with a PMH below who presents today for 3 month f/u for recent NSTEMI & CAD-PCI. He has a history of CAD dating back to 2001 underwent stent placement in while living on [place].   He subsequently was seen by Dr. Marijo File in [facility] 06/17/2017 but noted that this was a one time thing and he is not established there. Noah Scott was last seen on December 04, 2017 by Jory Sims, DNP -> no cardiac complaints. (He noted that his "ANGINA" equivalent is "severe heartburn radiating to his throat".  No longer noting this Sx post PCI.  He noted significant back pain with radiculopathy & intermittent ~"psedoclaudication" Sx. - he was seen by Dr. Sherley Bounds - who has asked about timing for possible spinal procedure in relation to antiplatelet agents.  Recent Hospitalizations:   March 2019 - NSTEMI.  Studies Personally Reviewed - (if available, images/films reviewed: From Epic Chart or Care Everywhere)  ECHO: 11/23/2017: Normal LV size and function.  EF 60 to 65%.  No RWMA.  GR 1 DD.  Mild RV dilation.  CARDIAC CATH-PCI 11/25/2017:   m-dRCA 95%  (DES PCI - 0%.  Resolute ONYX DES 2.75 x 18 mm - ~3.0 mm). Ost-proxRCA 75-65% (DES PCI-0%. 2 overlapping Resolute ONYX DES 3.0 x 26 & 3.0 x 8 mm --> 3.5-3.3 mm). ostOM1 65% ISR (from 2001). dRCA 50%-25% ost rPDA  LEA Dopplers 12/12/2017: Normal ABIs & TBIs bilaterally.  Interval History: Mr. Noah Scott returns today overall doing well doing well from a cardiac standpoint.  His anginal equivalent was severe "heartburn" in his chest up to his throat - he has not had any further  episodes since PCI.  He is able to walk some & is doing CRH, but is truthfully limited by his spinal stenosis with pseudoclaudication.  He has been dealing with this for several years, and is reluctant to take any pain medications.  However, he is interested in potential surgical options (seeing Dr. Ronnald Ramp).  No resting or exertional angina or dyspnea - except when the pain in his legs & back gets too bad.  No PND, orthopnea or notable edema.  No palpitations, lightheadedness, dizziness, weakness or syncope/near syncope. No TIA/amaurosis fugax symptoms. No melena, hematochezia, hematuria, or epstaxis. No claudication.  ROS: A comprehensive was performed. Review of Systems  Constitutional: Positive for weight loss. Negative for malaise/fatigue (just limited by leg pain).  Respiratory: Negative for cough and shortness of breath.   Musculoskeletal: Positive for back pain. Negative for myalgias.  Neurological: Positive for weakness (legs weak due ot neuropathy).       Neuropathic pain in both legs  All other systems reviewed and are negative.   I have reviewed and (if needed) personally updated the patient's problem list, medications, allergies, past medical and surgical history, social and family history.   Past Medical History:  Diagnosis Date  . CAD S/P percutaneous coronary angioplasty 2001   a) 2001 ([place]): PCI OM1.;; b) NSTEMI 11/2017 - ost-prox RCA 75-65% (Overlapping ONYX DES  3.0 x 26 & 3.0 x 8 --> 3.5-3.3 mm). m-dRCA 95% (ONYX DES 2.75 x 18 mm -> 3.0 mm). Ost OM1 65% ISR (med Rx). dRCA-OstRDA (50%-25%)  . Essential (primary) hypertension 11/21/2017  . Hyperlipidemia 11/21/2017  . Neurogenic claudication due to lumbar spinal stenosis 02/06/2018  . NSTEMI (non-ST elevated myocardial infarction) ([place]) 11/21/2017    Past Surgical History:  Procedure Laterality Date  . CORONARY STENT INTERVENTION N/A 11/25/2017   Procedure: CORONARY STENT INTERVENTION;  Surgeon: Leonie Man, MD;   Location: [facility];  Service: Cardiovascular:   m-dRCA 95%  (DES PCI - 0%.  Resolute ONYX DES 2.75 x 18 mm - ~3.0 mm). Ost-proxRCA 75-65% (DES PCI-0%. 2 overlapping Resolute ONYX DES 3.0 x 26 & 3.0 x 8 mm --> 3.5-3.3 mm)  . CORONARY STENT INTERVENTION  2001   ([place]): PCI OM1  . LEFT HEART CATH AND CORONARY ANGIOGRAPHY N/A 11/25/2017   Procedure: LEFT HEART CATH AND CORONARY ANGIOGRAPHY;  Surgeon: Leonie Man, MD;  Location: [facility];  Service: Cardiovascular:  Ost-proxRCA 75&65%(DES PCI), m-dRCA 95% (DES PCI), dRCA 50%-25% ost rPDA. ostOM1 65% ISR (from 2001).   . TRANSTHORACIC ECHOCARDIOGRAM  11/23/2017   Normal LV size and function.  EF 60 to 65%.  No RWMA.  GR 1 DD.  Mild RV dilation.    Current Meds  Medication Sig  . Aromatic Inhalants (VICKS VAPOR INHALER IN) Inhale 1 Inhaler into the lungs as needed (stuffy nose).  Marland Kitchen aspirin EC 81 MG tablet Take 81 mg by mouth daily.  Marland Kitchen doxazosin (CARDURA) 8 MG tablet Take 8 mg by mouth daily.  Marland Kitchen ezetimibe (ZETIA) 10 MG tablet Take 1 tablet (10 mg total) by mouth daily.  . furosemide (LASIX) 20 MG tablet Take 1 tablet (20 mg total) by mouth daily.  Marland Kitchen losartan (COZAAR) 50 MG tablet Take 1 tablet (50 mg total) by mouth daily.  . metoprolol tartrate (LOPRESSOR) 25 MG tablet Take 0.5 tablets (12.5 mg total) by mouth 2 (two) times daily.  . nitroGLYCERIN (NITROSTAT) 0.4 MG SL tablet Place 1 tablet (0.4 mg total) under the tongue every 5 (five) minutes as needed for chest pain.  . pantoprazole (PROTONIX) 40 MG tablet Take 1 tablet (40 mg total) by mouth daily.  . potassium chloride SA (K-DUR,KLOR-CON) 20 MEQ tablet Take 1 tablet (20 mEq total) by mouth daily.  . rosuvastatin (CRESTOR) 40 MG tablet Take 1 tablet (40 mg total) by mouth daily.  . ticagrelor (BRILINTA) 90 MG TABS tablet Take 1 tablet (90 mg total) by mouth 2 (two) times daily.  . tizanidine (ZANAFLEX) 2 MG capsule Take 1 capsule (2 mg total) by mouth at bedtime.     Allergies  Allergen Reactions  . Enalapril   . Plavix [Clopidogrel Bisulfate]     Social History   Tobacco Use  . Smoking status: Never Smoker  . Smokeless tobacco: Never Used  Substance Use Topics  . Alcohol use: No  . Drug use: No   Social History   Social History Narrative   Lives alone in an apartment on the first floor.  Has one daughter.  Retired Geophysical data processor.  Education: some college.     family history includes Heart disease in his father. Not sure of details.  Wt Readings from Last 3 Encounters:  03/12/18 224 lb 8 oz (101.8 kg)  02/06/18 228 lb 6 oz (103.6 kg)  02/04/18 229 lb 4.5 oz (104 kg)    PHYSICAL  EXAM BP 118/74   Pulse 80   Ht 5\' 10"  (1.778 m)   Wt 224 lb 8 oz (101.8 kg)   SpO2 98%   BMI 32.21 kg/m  Physical Exam  Constitutional: He is oriented to person, place, and time. He appears well-developed and well-nourished. No distress.  Healthy-appearing.  Well-groomed  HENT:  Head: Normocephalic and atraumatic.  Neck: Normal range of motion. No hepatojugular reflux and no JVD present. Carotid bruit is not present. No thyromegaly present.  Cardiovascular: Normal rate, regular rhythm, normal heart sounds and intact distal pulses.  No extrasystoles are present. PMI is not displaced. Exam reveals no gallop and no friction rub.  No murmur heard. Pulmonary/Chest: Effort normal and breath sounds normal. No respiratory distress. He has no wheezes. He has no rales.  Abdominal: Soft. Bowel sounds are normal. He exhibits no distension. There is no tenderness. There is no rebound.  No HSM  Musculoskeletal: He exhibits no edema.  Somewhat stiff, slow gait.  Neurological: He is alert and oriented to person, place, and time.  Psychiatric: He has a normal mood and affect. His behavior is normal. Judgment and thought content normal.  Vitals reviewed.   Adult ECG Report none  Other studies Reviewed: Additional studies/ records that were reviewed  today include:  Recent Labs: Jan 17, 2018  TC 87, TG 97, HDL 35, LDL 33.  BUN/Cr 20/1.2.  TSH 2.5.  A1c 6.2, glucose 109.   ASSESSMENT / PLAN: Problem List Items Addressed This Visit    Preoperative clearance    At present, he is not having any active anginal symptoms, he does not have any heart failure symptoms.  Despite having mildly elevated glucose levels and A1c, he is not considered diabetic at this point and is not on medications, especially not insulin.  No prior history of stroke.  Neurosurgery although high risk in general is not considered to be high risk for cardiac standpoint.  At present the most limiting factor for him to get undergo surgery would be a recent DES stent placement.  With new generation DES stents he could probably suspend antiplatelet agent safely at roughly 6 months.  Would asked that he continue aspirin perioperatively and then postop restart simply Brilinta alone.   He is on a stable dose of beta-blocker, which reduces his overall risk.Marland Kitchen   He would therefore be considered a low risk patient for low risk procedure.      Obesity (BMI 30.0-34.9) (Chronic)    The patient understands the need to lose weight with diet and exercise. We have discussed specific strategies for this. Once he is able to have back surgery - will be able to exercise more. Discussed dietary modifications - education per [place].      Metabolic syndrome (Chronic)    Borderline features of metabolic syndrome with a blood glucose level, obesity and hypertension.  Closely monitor glycemic control and follow A1c per PCP.  Lipids are well controlled as his blood pressure.  His weight is somewhat limited by his back and leg pain limiting exercise.  Hopefully once he is able to have this surgically  Improved, he will be able to get back into exercising.  This should help with weight loss.      Hyperlipidemia with target LDL less than 70 (Chronic)    Well-controlled lipids on Crestor.  If labs remain  stable, I think we can reduce down to 20 mg Crestor .      Essential (primary) hypertension (Chronic)  Blood pressures well controlled on low-dose losartan plus beta-blocker.  Continue current regimen.      CAD S/P percutaneous coronary angioplasty - Primary (Chronic)    Recent PCI to both proximal and mid to distal RCA in setting of non-STEMI.  No recurrent anginal symptoms.  Currently  plans to start cardiac rehab, but has been limited by his pseudoclaudication symptoms.  Plan:   For now we will continue aspirin plus Brilinta.  I discussed with Dr. Ronnald Ramp and he will be fine doing the surgery on Plavix but not Brilinta.  If we can make it through 6 months post MI ankle I think would probably be safe temporarily holding Brilinta for the surgery and continuing aspirin.  Would then restart Brilinta without aspirin postop.  Continue low-dose beta-blocker and losartan along with Crestor.          I spent a total of 30 minutes with the patient and chart review. >  50% of the time was spent in direct patient consultation.   Current medicines are reviewed at length with the patient today.  (+/- concerns) Pre-op questions The following changes have been made:  see below  Patient Instructions  NO CHANGES WITH MEDICATIONS  Your physician recommends that you schedule a follow-up appointment in SEPT 2019 [place]   If you need a refill on your cardiac medications before your next appointment, please call your pharmacy.    Studies Ordered:   No orders of the defined types were placed in this encounter.     Glenetta Hew, M.D., M.S. Interventional Cardiologist   Pager # [phone] Phone # [phone] [address] [place], [place] [address]   Thank you for choosing Heartcare at [facility]!!

## 2018-03-12 NOTE — Patient Instructions (Addendum)
NO CHANGES WITH MEDICATIONS  Your physician recommends that you schedule a follow-up appointment in SEPT 2019 [place]   If you need a refill on your cardiac medications before your next appointment, please call your pharmacy.

## 2018-03-14 ENCOUNTER — Encounter (HOSPITAL_COMMUNITY)
Admission: RE | Admit: 2018-03-14 | Discharge: 2018-03-14 | Disposition: A | Discharge status: Home / Self Care | Payer: Medicare Other | Source: Ambulatory Visit | Attending: Cardiology | Admitting: Cardiology

## 2018-03-14 ENCOUNTER — Encounter (HOSPITAL_COMMUNITY): Payer: Medicare Other

## 2018-03-14 DIAGNOSIS — Z7982 Long term (current) use of aspirin: Secondary | ICD-10-CM | POA: Diagnosis not present

## 2018-03-14 DIAGNOSIS — I214 Non-ST elevation (NSTEMI) myocardial infarction: Secondary | ICD-10-CM

## 2018-03-14 DIAGNOSIS — Z955 Presence of coronary angioplasty implant and graft: Secondary | ICD-10-CM | POA: Diagnosis not present

## 2018-03-14 DIAGNOSIS — E785 Hyperlipidemia, unspecified: Secondary | ICD-10-CM | POA: Diagnosis not present

## 2018-03-14 DIAGNOSIS — I10 Essential (primary) hypertension: Secondary | ICD-10-CM | POA: Diagnosis not present

## 2018-03-14 DIAGNOSIS — I2510 Atherosclerotic heart disease of native coronary artery without angina pectoris: Secondary | ICD-10-CM | POA: Diagnosis not present

## 2018-03-14 DIAGNOSIS — Z79899 Other long term (current) drug therapy: Secondary | ICD-10-CM | POA: Diagnosis not present

## 2018-03-15 ENCOUNTER — Encounter: Payer: Self-pay | Admitting: Cardiology

## 2018-03-15 DIAGNOSIS — E8881 Metabolic syndrome: Secondary | ICD-10-CM | POA: Insufficient documentation

## 2018-03-15 DIAGNOSIS — E669 Obesity, unspecified: Secondary | ICD-10-CM | POA: Insufficient documentation

## 2018-03-15 DIAGNOSIS — Z01818 Encounter for other preprocedural examination: Secondary | ICD-10-CM | POA: Insufficient documentation

## 2018-03-15 NOTE — Assessment & Plan Note (Signed)
Well-controlled lipids on Crestor.  If labs remain stable, I think we can reduce down to 20 mg Crestor .

## 2018-03-15 NOTE — Assessment & Plan Note (Signed)
The patient understands the need to lose weight with diet and exercise. We have discussed specific strategies for this. Once he is able to have back surgery - will be able to exercise more. Discussed dietary modifications - education per [place].

## 2018-03-15 NOTE — Assessment & Plan Note (Signed)
Recent PCI to both proximal and mid to distal RCA in setting of non-STEMI.  No recurrent anginal symptoms.  Currently  plans to start cardiac rehab, but has been limited by his pseudoclaudication symptoms.  Plan:   For now we will continue aspirin plus Brilinta.  I discussed with Dr. Ronnald Ramp and he will be fine doing the surgery on Plavix but not Brilinta.  If we can make it through 6 months post MI ankle I think would probably be safe temporarily holding Brilinta for the surgery and continuing aspirin.  Would then restart Brilinta without aspirin postop.  Continue low-dose beta-blocker and losartan along with Crestor.

## 2018-03-15 NOTE — Assessment & Plan Note (Signed)
Borderline features of metabolic syndrome with a blood glucose level, obesity and hypertension.  Closely monitor glycemic control and follow A1c per PCP.  Lipids are well controlled as his blood pressure.  His weight is somewhat limited by his back and leg pain limiting exercise.  Hopefully once he is able to have this surgically  Improved, he will be able to get back into exercising.  This should help with weight loss.

## 2018-03-15 NOTE — Assessment & Plan Note (Signed)
Blood pressures well controlled on low-dose losartan plus beta-blocker.  Continue current regimen.

## 2018-03-15 NOTE — Assessment & Plan Note (Signed)
At present, he is not having any active anginal symptoms, he does not have any heart failure symptoms.  Despite having mildly elevated glucose levels and A1c, he is not considered diabetic at this point and is not on medications, especially not insulin.  No prior history of stroke.  Neurosurgery although high risk in general is not considered to be high risk for cardiac standpoint.  At present the most limiting factor for him to get undergo surgery would be a recent DES stent placement.  With new generation DES stents he could probably suspend antiplatelet agent safely at roughly 6 months.  Would asked that he continue aspirin perioperatively and then postop restart simply Brilinta alone.   He is on a stable dose of beta-blocker, which reduces his overall risk.Marland Kitchen   He would therefore be considered a low risk patient for low risk procedure.

## 2018-03-17 ENCOUNTER — Other Ambulatory Visit: Payer: Self-pay | Admitting: Cardiology

## 2018-03-17 ENCOUNTER — Encounter (HOSPITAL_COMMUNITY): Payer: Medicare Other

## 2018-03-17 ENCOUNTER — Encounter (HOSPITAL_COMMUNITY)
Admission: RE | Admit: 2018-03-17 | Discharge: 2018-03-17 | Disposition: A | Discharge status: Home / Self Care | Payer: Medicare Other | Source: Ambulatory Visit | Attending: Cardiology | Admitting: Cardiology

## 2018-03-17 VITALS — Wt 224.9 lb

## 2018-03-17 DIAGNOSIS — I214 Non-ST elevation (NSTEMI) myocardial infarction: Secondary | ICD-10-CM

## 2018-03-17 DIAGNOSIS — Z7982 Long term (current) use of aspirin: Secondary | ICD-10-CM | POA: Diagnosis not present

## 2018-03-17 DIAGNOSIS — Z79899 Other long term (current) drug therapy: Secondary | ICD-10-CM | POA: Diagnosis not present

## 2018-03-17 DIAGNOSIS — I10 Essential (primary) hypertension: Secondary | ICD-10-CM | POA: Diagnosis not present

## 2018-03-17 DIAGNOSIS — Z7902 Long term (current) use of antithrombotics/antiplatelets: Secondary | ICD-10-CM | POA: Diagnosis not present

## 2018-03-17 DIAGNOSIS — I2510 Atherosclerotic heart disease of native coronary artery without angina pectoris: Secondary | ICD-10-CM | POA: Diagnosis not present

## 2018-03-17 DIAGNOSIS — Z955 Presence of coronary angioplasty implant and graft: Secondary | ICD-10-CM | POA: Insufficient documentation

## 2018-03-17 DIAGNOSIS — E785 Hyperlipidemia, unspecified: Secondary | ICD-10-CM | POA: Insufficient documentation

## 2018-03-17 NOTE — Telephone Encounter (Signed)
Rx(s) sent to pharmacy electronically.  

## 2018-03-17 NOTE — Progress Notes (Signed)
Noah Scott 82 y.o. male Nutrition Note Spoke with pt. Nutrition Plan and Nutrition Survey goals reviewed with pt. Pt is following a Heart Healthy diet. Pt wants to continue to lose wt. Previously lost 20 lbs on his own. Pt has been trying to lose wt by decreasing portion sizes at meals, choosing lean proteins, and avoiding fried foods. Additional wt loss tips reviewed. Pt set goal to increase vegetable consumption, will add one serving of vegetables in the evening. Discussed heart healthy veggie steamers as a quick and easy way to do so that is still heart healthy. Pt expressed understanding of the information reviewed. Pt aware of nutrition education classes offered.   No results found for: HGBA1C  Wt Readings from Last 3 Encounters:  03/17/18 224 lb 13.9 oz (102 kg)  03/12/18 224 lb 8 oz (101.8 kg)  02/06/18 228 lb 6 oz (103.6 kg)    Nutrition Diagnosis ? Food-and nutrition-related knowledge deficit related to lack of exposure to information as related to diagnosis of: ? CVD ? ? Obesity related to excessive energy intake as evidenced by a BMI of 32.27  Nutrition Intervention ? Pt's individual nutrition plan reviewed with pt. ? Benefits of adopting Heart Healthy diet discussed when [employer] reviewed.    Goal(s)  ? Pt to identify and limit food sources of saturated fat, trans fat, and sodium  Plan:  Pt to attend nutrition classes ? Nutrition I ? Nutrition II ? Portion Distortion  Will provide client-centered nutrition education as part of interdisciplinary care.   Monitor and evaluate progress toward nutrition goal with team.   Laurina Bustle, MS, RD, LDN 03/17/2018 12:04 PM

## 2018-03-19 ENCOUNTER — Encounter (HOSPITAL_COMMUNITY): Payer: Medicare Other

## 2018-03-19 ENCOUNTER — Encounter (HOSPITAL_COMMUNITY)
Admission: RE | Admit: 2018-03-19 | Discharge: 2018-03-19 | Disposition: A | Discharge status: Home / Self Care | Payer: Medicare Other | Source: Ambulatory Visit | Attending: Cardiology | Admitting: Cardiology

## 2018-03-19 DIAGNOSIS — Z955 Presence of coronary angioplasty implant and graft: Secondary | ICD-10-CM | POA: Diagnosis not present

## 2018-03-19 DIAGNOSIS — I214 Non-ST elevation (NSTEMI) myocardial infarction: Secondary | ICD-10-CM

## 2018-03-19 DIAGNOSIS — Z7982 Long term (current) use of aspirin: Secondary | ICD-10-CM | POA: Diagnosis not present

## 2018-03-19 DIAGNOSIS — I2510 Atherosclerotic heart disease of native coronary artery without angina pectoris: Secondary | ICD-10-CM | POA: Diagnosis not present

## 2018-03-19 DIAGNOSIS — I10 Essential (primary) hypertension: Secondary | ICD-10-CM | POA: Diagnosis not present

## 2018-03-19 DIAGNOSIS — Z79899 Other long term (current) drug therapy: Secondary | ICD-10-CM | POA: Diagnosis not present

## 2018-03-19 DIAGNOSIS — E785 Hyperlipidemia, unspecified: Secondary | ICD-10-CM | POA: Diagnosis not present

## 2018-03-21 ENCOUNTER — Encounter (HOSPITAL_COMMUNITY): Payer: Medicare Other

## 2018-03-21 ENCOUNTER — Encounter (HOSPITAL_COMMUNITY)
Admission: RE | Admit: 2018-03-21 | Discharge: 2018-03-21 | Disposition: A | Discharge status: Home / Self Care | Payer: Medicare Other | Source: Ambulatory Visit | Attending: Cardiology | Admitting: Cardiology

## 2018-03-21 DIAGNOSIS — Z79899 Other long term (current) drug therapy: Secondary | ICD-10-CM | POA: Diagnosis not present

## 2018-03-21 DIAGNOSIS — Z955 Presence of coronary angioplasty implant and graft: Secondary | ICD-10-CM | POA: Diagnosis not present

## 2018-03-21 DIAGNOSIS — I214 Non-ST elevation (NSTEMI) myocardial infarction: Secondary | ICD-10-CM

## 2018-03-21 DIAGNOSIS — I2510 Atherosclerotic heart disease of native coronary artery without angina pectoris: Secondary | ICD-10-CM | POA: Diagnosis not present

## 2018-03-21 DIAGNOSIS — I10 Essential (primary) hypertension: Secondary | ICD-10-CM | POA: Diagnosis not present

## 2018-03-21 DIAGNOSIS — Z7982 Long term (current) use of aspirin: Secondary | ICD-10-CM | POA: Diagnosis not present

## 2018-03-21 DIAGNOSIS — E785 Hyperlipidemia, unspecified: Secondary | ICD-10-CM | POA: Diagnosis not present

## 2018-03-23 ENCOUNTER — Other Ambulatory Visit: Payer: Self-pay | Admitting: Cardiology

## 2018-03-24 ENCOUNTER — Encounter (HOSPITAL_COMMUNITY): Payer: Medicare Other

## 2018-03-24 ENCOUNTER — Encounter (HOSPITAL_COMMUNITY)
Admission: RE | Admit: 2018-03-24 | Discharge: 2018-03-24 | Disposition: A | Discharge status: Home / Self Care | Payer: Medicare Other | Source: Ambulatory Visit | Attending: Cardiology | Admitting: Cardiology

## 2018-03-24 DIAGNOSIS — Z79899 Other long term (current) drug therapy: Secondary | ICD-10-CM | POA: Diagnosis not present

## 2018-03-24 DIAGNOSIS — Z955 Presence of coronary angioplasty implant and graft: Secondary | ICD-10-CM

## 2018-03-24 DIAGNOSIS — I10 Essential (primary) hypertension: Secondary | ICD-10-CM | POA: Diagnosis not present

## 2018-03-24 DIAGNOSIS — Z7982 Long term (current) use of aspirin: Secondary | ICD-10-CM | POA: Diagnosis not present

## 2018-03-24 DIAGNOSIS — I2510 Atherosclerotic heart disease of native coronary artery without angina pectoris: Secondary | ICD-10-CM | POA: Diagnosis not present

## 2018-03-24 DIAGNOSIS — I214 Non-ST elevation (NSTEMI) myocardial infarction: Secondary | ICD-10-CM

## 2018-03-24 DIAGNOSIS — E785 Hyperlipidemia, unspecified: Secondary | ICD-10-CM | POA: Diagnosis not present

## 2018-03-26 ENCOUNTER — Encounter (HOSPITAL_COMMUNITY): Payer: Medicare Other

## 2018-03-26 ENCOUNTER — Encounter (HOSPITAL_COMMUNITY): Payer: Self-pay

## 2018-03-26 ENCOUNTER — Encounter (HOSPITAL_COMMUNITY)
Admission: RE | Admit: 2018-03-26 | Discharge: 2018-03-26 | Disposition: A | Discharge status: Home / Self Care | Payer: Medicare Other | Source: Ambulatory Visit | Attending: Cardiology | Admitting: Cardiology

## 2018-03-26 DIAGNOSIS — Z955 Presence of coronary angioplasty implant and graft: Secondary | ICD-10-CM

## 2018-03-26 DIAGNOSIS — Z79899 Other long term (current) drug therapy: Secondary | ICD-10-CM | POA: Diagnosis not present

## 2018-03-26 DIAGNOSIS — I2510 Atherosclerotic heart disease of native coronary artery without angina pectoris: Secondary | ICD-10-CM | POA: Diagnosis not present

## 2018-03-26 DIAGNOSIS — E785 Hyperlipidemia, unspecified: Secondary | ICD-10-CM | POA: Diagnosis not present

## 2018-03-26 DIAGNOSIS — I214 Non-ST elevation (NSTEMI) myocardial infarction: Secondary | ICD-10-CM

## 2018-03-26 DIAGNOSIS — Z7982 Long term (current) use of aspirin: Secondary | ICD-10-CM | POA: Diagnosis not present

## 2018-03-26 DIAGNOSIS — I10 Essential (primary) hypertension: Secondary | ICD-10-CM | POA: Diagnosis not present

## 2018-03-27 NOTE — Progress Notes (Signed)
Cardiac Individual Treatment Plan  Patient Details  Name: Noah Scott MRN: [id number] Date of Birth: 1935-10-27 Referring Provider:     CARDIAC REHAB PHASE II ORIENTATION from 02/04/2018 in [place]  Referring Provider  Glenetta Hew, MD.      Initial Encounter Date:    CARDIAC REHAB PHASE II ORIENTATION from 02/04/2018 in [place]  Date  02/04/18      Visit Diagnosis: 11/25/17 DES x3 RCA  NSTEMI (non-ST elevated myocardial infarction) [facility])  Patient's Home Medications on Admission:  Current Outpatient Medications:  .  Aromatic Inhalants (VICKS VAPOR INHALER IN), Inhale 1 Inhaler into the lungs as needed (stuffy nose)., Disp: , Rfl:  .  aspirin EC 81 MG tablet, Take 81 mg by mouth daily., Disp: , Rfl:  .  BRILINTA 90 MG TABS tablet, TAKE 1 TABLET(90 MG) BY MOUTH TWICE DAILY, Disp: 60 tablet, Rfl: 0 .  doxazosin (CARDURA) 8 MG tablet, Take 8 mg by mouth daily., Disp: , Rfl:  .  ezetimibe (ZETIA) 10 MG tablet, Take 1 tablet (10 mg total) by mouth daily., Disp: 30 tablet, Rfl: 3 .  furosemide (LASIX) 20 MG tablet, Take 1 tablet (20 mg total) by mouth daily., Disp: 30 tablet, Rfl: 11 .  losartan (COZAAR) 50 MG tablet, Take 1 tablet (50 mg total) by mouth daily., Disp: 30 tablet, Rfl: 11 .  metoprolol tartrate (LOPRESSOR) 25 MG tablet, Take 0.5 tablets (12.5 mg total) by mouth 2 (two) times daily., Disp: 30 tablet, Rfl: 3 .  nitroGLYCERIN (NITROSTAT) 0.4 MG SL tablet, Place 1 tablet (0.4 mg total) under the tongue every 5 (five) minutes as needed for chest pain., Disp: 25 tablet, Rfl: 3 .  pantoprazole (PROTONIX) 40 MG tablet, Take 1 tablet (40 mg total) by mouth daily., Disp: 30 tablet, Rfl: 3 .  potassium chloride SA (K-DUR,KLOR-CON) 20 MEQ tablet, Take 1 tablet (20 mEq total) by mouth daily., Disp: 30 tablet, Rfl: 11 .  rosuvastatin (CRESTOR) 40 MG tablet, Take 1 tablet (40 mg total) by mouth daily., Disp: 30 tablet,  Rfl: 3 .  tizanidine (ZANAFLEX) 2 MG capsule, Take 1 capsule (2 mg total) by mouth at bedtime., Disp: 30 capsule, Rfl: 5  Past Medical History: Past Medical History:  Diagnosis Date  . CAD S/P percutaneous coronary angioplasty 2001   a) 2001 ([place]): PCI OM1.;; b) NSTEMI 11/2017 - ost-prox RCA 75-65% (Overlapping ONYX DES 3.0 x 26 & 3.0 x 8 --> 3.5-3.3 mm). m-dRCA 95% (ONYX DES 2.75 x 18 mm -> 3.0 mm). Ost OM1 65% ISR (med Rx). dRCA-OstRDA (50%-25%)  . Essential (primary) hypertension 11/21/2017  . Hyperlipidemia 11/21/2017  . Neurogenic claudication due to lumbar spinal stenosis 02/06/2018  . NSTEMI (non-ST elevated myocardial infarction) ([place]) 11/21/2017    Tobacco Use: Social History   Tobacco Use  Smoking Status Never Smoker  Smokeless Tobacco Never Used    Labs: Recent Review Flowsheet Data    There is no flowsheet data to display.      Capillary Blood Glucose: No results found for: GLUCAP   Exercise Target Goals:    Exercise Program Goal: Individual exercise prescription set using results from initial 6 min walk test and THRR while considering  patient's activity barriers and safety.   Exercise Prescription Goal: Initial exercise prescription builds to 30-45 minutes a day of aerobic activity, 2-3 days per week.  Home exercise guidelines will be given to patient during program as part of exercise  prescription that the participant will acknowledge.  Activity Barriers & Risk Stratification: Activity Barriers & Cardiac Risk Stratification - 02/04/18 0740      Activity Barriers & Cardiac Risk Stratification   Activity Barriers  Back Problems;Shortness of Breath Chronic lower back pain that radiates to both legs.    Cardiac Risk Stratification  High       6 Minute Walk: 6 Minute Walk    Row Name 02/04/18 0920         6 Minute Walk   Phase  Initial     Distance  1000 feet     Walk Time  6 minutes Stopped at 5:30 due to back/leg pain     # of Rest Breaks  1 30  seconds from 5:30 to 6:00     MPH  1.89     METS  1.63     RPE  13     Perceived Dyspnea   2     VO2 Peak  5.72     Symptoms  Yes (comment)     Comments  Patient c/o SOB during walk test. Pt stopped at 5:30 due to lower back and leg pain, which he rated 7/10 on the pain scale.     Resting HR  85 bpm     Resting BP  114/60     Resting Oxygen Saturation   98 %     Exercise Oxygen Saturation  during 6 min walk  97 %     Max Ex. HR  122 bpm     Max Ex. BP  126/70     2 Minute Post BP  114/70        Oxygen Initial Assessment:   Oxygen Re-Evaluation:   Oxygen Discharge (Final Oxygen Re-Evaluation):   Initial Exercise Prescription: Initial Exercise Prescription - 02/04/18 1000      Date of Initial Exercise RX and Referring Provider   Date  02/04/18    Referring Provider  Glenetta Hew, MD.      Recumbant Bike   Level  1    Watts  15    Minutes  10    METs  2.45      NuStep   Level  1    SPM  85    Minutes  10    METs  2      Arm Ergometer   Level  1    Watts  20    Minutes  5    METs  2      Track   Laps  5    Minutes  5    METs  2.74      Prescription Details   Frequency (times per week)  3    Duration  Progress to 30 minutes of continuous aerobic without signs/symptoms of physical distress      Intensity   THRR 40-80% of Max Heartrate  56-113    Ratings of Perceived Exertion  11-13    Perceived Dyspnea  0-4      Progression   Progression  Continue to progress workloads to maintain intensity without signs/symptoms of physical distress.      Resistance Training   Training Prescription  Yes    Weight  2lbs    Reps  10-15       Perform Capillary Blood Glucose checks as needed.  Exercise Prescription Changes: Exercise Prescription Changes    Row Name 02/12/18 1400 02/17/18 1347 02/24/18 1400 03/10/18 1600 03/27/18 1400  Response to Exercise   Blood Pressure (Admit)  110/70  104/62  122/70  118/60  114/62   Blood Pressure (Exercise)  152/70   124/84  138/70  132/64  112/64   Blood Pressure (Exit)  118/68  100/64  96/60  106/64  104/66   Heart Rate (Admit)  71 bpm  86 bpm  78 bpm  76 bpm  73 bpm   Heart Rate (Exercise)  117 bpm  113 bpm  101 bpm  95 bpm  113 bpm   Heart Rate (Exit)  81 bpm  86 bpm  72 bpm  88 bpm  70 bpm   Rating of Perceived Exertion (Exercise)  11  12  11  12  12    Perceived Dyspnea (Exercise)  0  0  0  0  0   Symptoms  None  None   None  None  None   Comments  Pt oriented to exercise equipment  -  -  -  -   Duration  Progress to 30 minutes of  aerobic without signs/symptoms of physical distress  Continue with 30 min of aerobic exercise without signs/symptoms of physical distress.  Continue with 30 min of aerobic exercise without signs/symptoms of physical distress.  Continue with 30 min of aerobic exercise without signs/symptoms of physical distress.  Continue with 30 min of aerobic exercise without signs/symptoms of physical distress.   Intensity  THRR New  THRR unchanged  THRR unchanged  THRR unchanged  THRR unchanged     Progression   Progression  Continue to progress workloads to maintain intensity without signs/symptoms of physical distress.  Continue to progress workloads to maintain intensity without signs/symptoms of physical distress.  Continue to progress workloads to maintain intensity without signs/symptoms of physical distress.  Continue to progress workloads to maintain intensity without signs/symptoms of physical distress.  Continue to progress workloads to maintain intensity without signs/symptoms of physical distress.   Average METs  2.5  2.78  2.65  2.6  3.06     Resistance Training   Training Prescription  No  Yes  Yes  Yes  No   Weight  -  2lbs  2lbs  3lbs  -   Reps  -  10-15  10-15  10-15  -   Time  -  10 Minutes  10 Minutes  10 Minutes  -     Interval Training   Interval Training  -  No  No  No  No     Recumbant Bike   Level  1  2  2  2   -   Watts  21  15  32  40  -   Minutes  15  10   10  10   -   METs  2.6  2.5  2.9  2.8  -     NuStep   Level  1  2  2  4  5    SPM  85  85  85  95  115   Minutes  15  10  10  10  10    METs  1  2.1  2.4  2.6  4.1     Arm Ergometer   Level  -  1  1  2  2    Watts  -  48  10  14  18    Minutes  -  10  5  10  10    METs  -  2.1  1.5  2.4  2.3     Track   Laps  -  -  4  -  -   Minutes  -  -  5  -  -   METs  -  -  1.7  -  -     Home Exercise Plan   Plans to continue exercise at  -  -  Home (comment)  Home (comment) Walking  Home (comment) Walking   Frequency  -  -  Add 2 additional days to program exercise sessions.  Add 2 additional days to program exercise sessions.  Add 2 additional days to program exercise sessions.   Initial Home Exercises Provided  -  -  02/24/18  02/24/18  02/24/18      Exercise Comments: Exercise Comments    Row Name 02/12/18 1412 02/24/18 1435 03/27/18 1441       Exercise Comments  Today was pt's first day of exercise. Pt oriented to exercise equipment. Will continue to monitor and progress pt.   Reviewed HEP with pt. Pt is not currently exercising at home, but has access to gym and pool. Will continue to monitor and progress pt as tolerated.   Reviewed METs and Goals with pt. Pt is slowly progressing in rehab despite limitations with leg pain. Will continue to work with pt to progress workloads as tolerated.         Exercise Goals and Review: Exercise Goals    Row Name 02/04/18 0958             Exercise Goals   Increase Physical Activity  Yes       Intervention  Provide advice, education, support and counseling about physical activity/exercise needs.;Develop an individualized exercise prescription for aerobic and resistive training based on initial evaluation findings, risk stratification, comorbidities and participant's personal goals.       Expected Outcomes  Short Term: Attend rehab on a regular basis to increase amount of physical activity.;Long Term: Exercising regularly at least 3-5 days a  week.;Long Term: Add in home exercise to make exercise part of routine and to increase amount of physical activity.       Increase Strength and Stamina  Yes       Intervention  Develop an individualized exercise prescription for aerobic and resistive training based on initial evaluation findings, risk stratification, comorbidities and participant's personal goals.       Expected Outcomes  Short Term: Increase workloads from initial exercise prescription for resistance, speed, and METs.;Short Term: Perform resistance training exercises routinely during rehab and add in resistance training at home;Long Term: Improve cardiorespiratory fitness, muscular endurance and strength as measured by increased METs and functional capacity (6MWT)       Able to understand and use rate of perceived exertion (RPE) scale  Yes       Intervention  Provide education and explanation on how to use RPE scale       Expected Outcomes  Short Term: Able to use RPE daily in rehab to express subjective intensity level;Long Term:  Able to use RPE to guide intensity level when exercising independently       Knowledge and understanding of Target Heart Rate Range (THRR)  Yes       Intervention  Provide education and explanation of THRR including how the numbers were predicted and where they are located for reference       Expected Outcomes  Short Term: Able to state/look up THRR;Long Term: Able to use THRR to govern intensity  when exercising independently;Short Term: Able to use daily as guideline for intensity in rehab       Able to check pulse independently  Yes       Intervention  Provide education and demonstration on how to check pulse in carotid and radial arteries.;Review the importance of being able to check your own pulse for safety during independent exercise       Expected Outcomes  Short Term: Able to explain why pulse checking is important during independent exercise;Long Term: Able to check pulse independently and accurately        Understanding of Exercise Prescription  Yes       Intervention  Provide education, explanation, and written materials on patient's individual exercise prescription       Expected Outcomes  Short Term: Able to explain program exercise prescription;Long Term: Able to explain home exercise prescription to exercise independently          Exercise Goals Re-Evaluation : Exercise Goals Re-Evaluation    Row Name 02/24/18 1439 03/27/18 1443           Exercise Goal Re-Evaluation   Exercise Goals Review  Increase Physical Activity;Increase Strength and Stamina;Able to understand and use rate of perceived exertion (RPE) scale;Knowledge and understanding of Target Heart Rate Range (THRR);Able to check pulse independently;Understanding of Exercise Prescription  Increase Physical Activity;Understanding of Exercise Prescription      Comments  Reviewed HEP with pt. Also reviewed THRR, RPE Scale, weather precautions, NTG use, endpoints of exercise, warmup and cool down. Pt has limitations with leg pain. Pt has follow up with Dr. regarding claudication. Pt advised to work within his limits.    Pt is responding well to exericse prescription. Pt is able to exericse 30 minutes with minimal difficulty. Pt has limited the amount of walking around the track due to leg pain, but has subsituted the track for the Arm Crank.       Expected Outcomes  Pt will exercise 1-2 days at home. Pt has a community gym and pool. Pt will work with limitations. Will continue to monitor pt.   Pt will continue to exercise 1-2 days a week in addition to cardiac rehab exercise. Pt will continue to increase cardiorespiratory fitness. Will continue to monitor.           Discharge Exercise Prescription (Final Exercise Prescription Changes): Exercise Prescription Changes - 03/27/18 1400      Response to Exercise   Blood Pressure (Admit)  114/62    Blood Pressure (Exercise)  112/64    Blood Pressure (Exit)  104/66    Heart Rate  (Admit)  73 bpm    Heart Rate (Exercise)  113 bpm    Heart Rate (Exit)  70 bpm    Rating of Perceived Exertion (Exercise)  12    Perceived Dyspnea (Exercise)  0    Symptoms  None    Duration  Continue with 30 min of aerobic exercise without signs/symptoms of physical distress.    Intensity  THRR unchanged      Progression   Progression  Continue to progress workloads to maintain intensity without signs/symptoms of physical distress.    Average METs  3.06      Resistance Training   Training Prescription  No      Interval Training   Interval Training  No      NuStep   Level  5    SPM  115    Minutes  10    METs  4.1      Arm Ergometer   Level  2    Watts  18    Minutes  10    METs  2.3      Home Exercise Plan   Plans to continue exercise at  Home (comment) Walking    Frequency  Add 2 additional days to program exercise sessions.    Initial Home Exercises Provided  02/24/18       Nutrition:  Target Goals: Understanding of nutrition guidelines, daily intake of sodium 1500mg , cholesterol 200mg , calories 30% from fat and 7% or less from saturated fats, daily to have 5 or more servings of fruits and vegetables.  Biometrics: Pre Biometrics - 02/04/18 0740      Pre Biometrics   Height  5\' 8"  (1.727 m)    Weight  229 lb 4.5 oz (104 kg)    Waist Circumference  45 inches    Hip Circumference  44.25 inches    Waist to Hip Ratio  1.02 %    BMI (Calculated)  34.87    Triceps Skinfold  13 mm    % Body Fat  32.4 %    Grip Strength  38.5 kg    Flexibility  0 in Wearing back brace.    Single Leg Stand  4.69 seconds        Nutrition Therapy Plan and Nutrition Goals: Nutrition Therapy & Goals - 02/05/18 1611      Nutrition Therapy   Diet  Heart Healthy      Intervention Plan   Intervention  Prescribe, educate and counsel regarding individualized specific dietary modifications aiming towards targeted core components such as weight, hypertension, lipid management,  diabetes, heart failure and other comorbidities.    Expected Outcomes  Short Term Goal: Understand basic principles of dietary content, such as calories, fat, sodium, cholesterol and nutrients.;Long Term Goal: Adherence to prescribed nutrition plan.       Nutrition Assessments: Nutrition Assessments - 02/05/18 1611      MEDFICTS Scores   Pre Score  42       Nutrition Goals Re-Evaluation:   Nutrition Goals Re-Evaluation:   Nutrition Goals Discharge (Final Nutrition Goals Re-Evaluation):   Psychosocial: Target Goals: Acknowledge presence or absence of significant depression and/or stress, maximize coping skills, provide positive support system. Participant is able to verbalize types and ability to use techniques and skills needed for reducing stress and depression.  Initial Review & Psychosocial Screening: Initial Psych Review & Screening - 02/04/18 0749      Initial Review   Current issues with  None Identified      Family Dynamics   Good Support System?  Yes daughter, grandchildren    Comments  moved to [place] from [place] when granddaughter started [employer]      Barriers   Psychosocial barriers to participate in program  There are no identifiable barriers or psychosocial needs.      Screening Interventions   Interventions  Encouraged to exercise       Quality of Life Scores: Quality of Life - 02/04/18 0738      Quality of Life Scores   Health/Function Pre  22.8 %    Socioeconomic Pre  24.64 %    Psych/Spiritual Pre  25.5 %    Family Pre  28.5 %    GLOBAL Pre  24.45 %      Scores of 19 and below usually indicate a poorer quality of life in these areas.  A difference of  2-3 points is a clinically meaningful difference.  A difference of 2-3 points in the total score of the Quality of Life Index has been associated with significant improvement in overall quality of life, self-image, physical symptoms, and general health in studies assessing change in quality  of life.  PHQ-9: Recent Review Flowsheet Data    Depression screen [facility] 2/9 02/12/2018   Decreased Interest 0   Down, Depressed, Hopeless 0   PHQ - 2 Score 0     Interpretation of Total Score  Total Score Depression Severity:  1-4 = Minimal depression, 5-9 = Mild depression, 10-14 = Moderate depression, 15-19 = Moderately severe depression, 20-27 = Severe depression   Psychosocial Evaluation and Intervention: Psychosocial Evaluation - 02/12/18 1657      Psychosocial Evaluation & Interventions   Interventions  Encouraged to exercise with the program and follow exercise prescription    Comments  No psychosocial needs identified. No interventions necessary. Pt enjoys visiting his daughter everyday.     Expected Outcomes  Pt will exhibit a positive outlook with good coping skills.     Continue Psychosocial Services   No Follow up required       Psychosocial Re-Evaluation: Psychosocial Re-Evaluation    [place] Name 02/27/18 0736 03/26/18 1049           Psychosocial Re-Evaluation   Current issues with  None Identified  None Identified      Comments  No psychosocial needs identified. No intervention necessary.  No psychosocial needs identified. No intervention necessary.      Expected Outcomes  Krishawn will continue to exhibit a positive outlook utilizing good coping skills.  Keone will continue to exhibit a positive outlook utilizing good coping skills.      Interventions  Encouraged to attend Cardiac Rehabilitation for the exercise  Encouraged to attend Cardiac Rehabilitation for the exercise      Continue Psychosocial Services   No Follow up required  No Follow up required         Psychosocial Discharge (Final Psychosocial Re-Evaluation): Psychosocial Re-Evaluation - 03/26/18 1049      Psychosocial Re-Evaluation   Current issues with  None Identified    Comments  No psychosocial needs identified. No intervention necessary.    Expected Outcomes  Kaisen will continue to exhibit a  positive outlook utilizing good coping skills.    Interventions  Encouraged to attend Cardiac Rehabilitation for the exercise    Continue Psychosocial Services   No Follow up required       Vocational Rehabilitation: Provide vocational rehab assistance to qualifying candidates.   Vocational Rehab Evaluation & Intervention: Vocational Rehab - 02/04/18 0749      Initial Vocational Rehab Evaluation & Intervention   Assessment shows need for Vocational Rehabilitation  No retired        Education: Education Goals: Education classes will be provided on a weekly basis, covering required topics. Participant will state understanding/return demonstration of topics presented.  Learning Barriers/Preferences: Learning Barriers/Preferences - 02/04/18 1000      Learning Barriers/Preferences   Learning Barriers  Sight    Learning Preferences  Skilled Demonstration;Written Material       Education Topics: Count Your Pulse:  -Group instruction provided by verbal instruction, demonstration, patient participation and written materials to support subject.  Instructors address importance of being able to find your pulse and how to count your pulse when at home without a heart monitor.  Patients get hands on experience counting their pulse with staff help and  individually.   CARDIAC REHAB PHASE II EXERCISE from 03/26/2018 in [place]  Date  03/14/18  Educator  RN  Instruction Review Code  2- Demonstrated Understanding      Heart Attack, Angina, and Risk Factor Modification:  -Group instruction provided by verbal instruction, video, and written materials to support subject.  Instructors address signs and symptoms of angina and heart attacks.    Also discuss risk factors for heart disease and how to make changes to improve heart health risk factors.   CARDIAC REHAB PHASE II EXERCISE from 03/26/2018 in [place]  Date  03/05/18   Instruction Review Code  2- Demonstrated Understanding      Functional Fitness:  -Group instruction provided by verbal instruction, demonstration, patient participation, and written materials to support subject.  Instructors address safety measures for doing things around the house.  Discuss how to get up and down off the floor, how to pick things up properly, how to safely get out of a chair without assistance, and balance training.   Meditation and Mindfulness:  -Group instruction provided by verbal instruction, patient participation, and written materials to support subject.  Instructor addresses importance of mindfulness and meditation practice to help reduce stress and improve awareness.  Instructor also leads participants through a meditation exercise.    Stretching for Flexibility and Mobility:  -Group instruction provided by verbal instruction, patient participation, and written materials to support subject.  Instructors lead participants through series of stretches that are designed to increase flexibility thus improving mobility.  These stretches are additional exercise for major muscle groups that are typically performed during regular warm up and cool down.   Hands Only CPR:  -Group verbal, video, and participation provides a basic overview of AHA guidelines for community CPR. Role-play of emergencies allow participants the opportunity to practice calling for help and chest compression technique with discussion of AED use.   Hypertension: -Group verbal and written instruction that provides a basic overview of hypertension including the most recent diagnostic guidelines, risk factor reduction with self-care instructions and medication management.   CARDIAC REHAB PHASE II EXERCISE from 03/26/2018 in [place]  Date  02/28/18  Educator  RN  Instruction Review Code  2- Demonstrated Understanding       Nutrition I class: Heart Healthy Eating:   -Group instruction provided by PowerPoint slides, verbal discussion, and written materials to support subject matter. The instructor gives an explanation and review of the Therapeutic Lifestyle Changes diet recommendations, which includes a discussion on lipid goals, dietary fat, sodium, fiber, plant stanol/sterol esters, sugar, and the components of a well-balanced, healthy diet.   Nutrition II class: Lifestyle Skills:  -Group instruction provided by PowerPoint slides, verbal discussion, and written materials to support subject matter. The instructor gives an explanation and review of label reading, grocery shopping for heart health, heart healthy recipe modifications, and ways to make healthier choices when eating out.   Diabetes Question & Answer:  -Group instruction provided by PowerPoint slides, verbal discussion, and written materials to support subject matter. The instructor gives an explanation and review of diabetes co-morbidities, pre- and post-prandial blood glucose goals, pre-exercise blood glucose goals, signs, symptoms, and treatment of hypoglycemia and hyperglycemia, and foot care basics.   CARDIAC REHAB PHASE II EXERCISE from 03/26/2018 in [place]  Date  03/12/18  Educator  RD  Instruction Review Code  2- Demonstrated Understanding  Diabetes Blitz:  -Group instruction provided by PowerPoint slides, verbal discussion, and written materials to support subject matter. The instructor gives an explanation and review of the physiology behind type 1 and type 2 diabetes, diabetes medications and rational behind using different medications, pre- and post-prandial blood glucose recommendations and Hemoglobin A1c goals, diabetes diet, and exercise including blood glucose guidelines for exercising safely.    Portion Distortion:  -Group instruction provided by PowerPoint slides, verbal discussion, written materials, and food models to support subject  matter. The instructor gives an explanation of serving size versus portion size, changes in portions sizes over the last 20 years, and what consists of a serving from each food group.   Stress Management:  -Group instruction provided by verbal instruction, video, and written materials to support subject matter.  Instructors review role of stress in heart disease and how to cope with stress positively.     CARDIAC REHAB PHASE II EXERCISE from 03/26/2018 in [place]  Date  02/12/18  Instruction Review Code  2- Demonstrated Understanding      Exercising on Your Own:  -Group instruction provided by verbal instruction, power point, and written materials to support subject.  Instructors discuss benefits of exercise, components of exercise, frequency and intensity of exercise, and end points for exercise.  Also discuss use of nitroglycerin and activating EMS.  Review options of places to exercise outside of rehab.  Review guidelines for sex with heart disease.   CARDIAC REHAB PHASE II EXERCISE from 03/26/2018 in [place]  Date  03/07/18  Instruction Review Code  2- Demonstrated Understanding      Cardiac Drugs I:  -Group instruction provided by verbal instruction and written materials to support subject.  Instructor reviews cardiac drug classes: antiplatelets, anticoagulants, beta blockers, and statins.  Instructor discusses reasons, side effects, and lifestyle considerations for each drug class.   CARDIAC REHAB PHASE II EXERCISE from 03/26/2018 in [place]  Date  03/26/18  Instruction Review Code  2- Demonstrated Understanding      Cardiac Drugs II:  -Group instruction provided by verbal instruction and written materials to support subject.  Instructor reviews cardiac drug classes: angiotensin converting enzyme inhibitors (ACE-I), angiotensin II receptor blockers (ARBs), nitrates, and calcium  channel blockers.  Instructor discusses reasons, side effects, and lifestyle considerations for each drug class.   CARDIAC REHAB PHASE II EXERCISE from 03/26/2018 in [place]  Date  02/26/18  Instruction Review Code  2- Demonstrated Understanding      Anatomy and Physiology of the Circulatory System:  Group verbal and written instruction and models provide basic cardiac anatomy and physiology, with the coronary electrical and arterial systems. Review of: AMI, Angina, Valve disease, Heart Failure, Peripheral Artery Disease, Cardiac Arrhythmia, Pacemakers, and the ICD.   CARDIAC REHAB PHASE II EXERCISE from 03/26/2018 in [place]  Date  02/19/18  Instruction Review Code  2- Demonstrated Understanding      Other Education:  -Group or individual verbal, written, or video instructions that support the educational goals of the cardiac rehab program.   Holiday Eating Survival Tips:  -Group instruction provided by PowerPoint slides, verbal discussion, and written materials to support subject matter. The instructor gives patients tips, tricks, and techniques to help them not only survive but enjoy the holidays despite the onslaught of food that accompanies the holidays.   Knowledge Questionnaire Score: Knowledge Questionnaire Score -  02/04/18 0737      Knowledge Questionnaire Score   Pre Score  18/24       Core Components/Risk Factors/Patient Goals at Admission: Personal Goals and Risk Factors at Admission - 02/04/18 0955      Core Components/Risk Factors/Patient Goals on Admission    Weight Management  Yes;Obesity    Intervention  Weight Management: Develop a combined nutrition and exercise program designed to reach desired caloric intake, while maintaining appropriate intake of nutrient and fiber, sodium and fats, and appropriate energy expenditure required for the weight goal.;Weight Management: Provide education and  appropriate resources to help participant work on and attain dietary goals.;Weight Management/Obesity: Establish reasonable short term and long term weight goals.;Obesity: Provide education and appropriate resources to help participant work on and attain dietary goals.    Admit Weight  229 lb 4.5 oz (104 kg)    Goal Weight: Short Term  223 lb (101.2 kg)    Goal Weight: Long Term  199 lb (90.3 kg)    Expected Outcomes  Short Term: Continue to assess and modify interventions until short term weight is achieved;Long Term: Adherence to nutrition and physical activity/exercise program aimed toward attainment of established weight goal;Weight Loss: Understanding of general recommendations for a balanced deficit meal plan, which promotes 1-2 lb weight loss per week and includes a negative energy balance of [phone] kcal/d;Understanding recommendations for meals to include 15-35% energy as protein, 25-35% energy from fat, 35-60% energy from carbohydrates, less than 200mg  of dietary cholesterol, 20-35 gm of total fiber daily;Understanding of distribution of calorie intake throughout the day with the consumption of 4-5 meals/snacks    Hypertension  Yes    Intervention  Provide education on lifestyle modifcations including regular physical activity/exercise, weight management, moderate sodium restriction and increased consumption of fresh fruit, vegetables, and low fat dairy, alcohol moderation, and smoking cessation.;Monitor prescription use compliance.    Expected Outcomes  Short Term: Continued assessment and intervention until BP is < 140/83mm HG in hypertensive participants. < 130/2mm HG in hypertensive participants with diabetes, heart failure or chronic kidney disease.;Long Term: Maintenance of blood pressure at goal levels.    Lipids  Yes    Intervention  Provide education and support for participant on nutrition & aerobic/resistive exercise along with prescribed medications to achieve LDL 70mg , HDL >40mg .     Expected Outcomes  Short Term: Participant states understanding of desired cholesterol values and is compliant with medications prescribed. Participant is following exercise prescription and nutrition guidelines.;Long Term: Cholesterol controlled with medications as prescribed, with individualized exercise RX and with personalized nutrition plan. Value goals: LDL < 70mg , HDL > 40 mg.       Core Components/Risk Factors/Patient Goals Review:  Goals and Risk Factor Review    Row Name 02/12/18 1655 02/27/18 0736 03/26/18 1049         Core Components/Risk Factors/Patient Goals Review   Personal Goals Review  Weight Management/Obesity;Lipids;Hypertension  Weight Management/Obesity;Lipids;Hypertension  Weight Management/Obesity;Lipids;Hypertension     Review  Pt with multiple CAD RF eager to participate in CR exercise. Pt is looking forward to gaining comfort with daily activties.   Pt with multiple CAD RF eager to participate in CR exercise. Mayford is tolerating exercise well thus far in the program.   Pt with multiple CAD RF eager to participate in CR exercise. Prathik continues to tolerate exercise well.  He is increasing his motivation.     Expected Outcomes  Pt will participate in CR exercise, nutrition, and lifestyle modification.  Pt will participate in CR exercise, nutrition, and lifestyle modification.   Pt will participate in CR exercise, nutrition, and lifestyle modification.         Core Components/Risk Factors/Patient Goals at Discharge (Final Review):  Goals and Risk Factor Review - 03/26/18 1049      Core Components/Risk Factors/Patient Goals Review   Personal Goals Review  Weight Management/Obesity;Lipids;Hypertension    Review  Pt with multiple CAD RF eager to participate in CR exercise. Crue continues to tolerate exercise well.  He is increasing his motivation.    Expected Outcomes  Pt will participate in CR exercise, nutrition, and lifestyle modification.        ITP  Comments: ITP Comments    Row Name 02/04/18 0735 02/27/18 0731 03/26/18 1048       ITP Comments  Medical Director- Dr. Fransico Him, MD  30 Day ITP Review. Pt is off to a good start with exercise.  He is attending education session as well to reduce his risk.  30 Day ITP Review. Zaedyn is tolerating exercise well.  He feels that he has more motivation now compared to when he started the CR Program.  His attendance is great.        Comments: See ITP Comments

## 2018-03-28 ENCOUNTER — Encounter (HOSPITAL_COMMUNITY): Payer: Medicare Other

## 2018-03-28 ENCOUNTER — Encounter (HOSPITAL_COMMUNITY)
Admission: RE | Admit: 2018-03-28 | Discharge: 2018-03-28 | Disposition: A | Discharge status: Home / Self Care | Payer: Medicare Other | Source: Ambulatory Visit | Attending: Cardiology | Admitting: Cardiology

## 2018-03-28 DIAGNOSIS — I2510 Atherosclerotic heart disease of native coronary artery without angina pectoris: Secondary | ICD-10-CM | POA: Diagnosis not present

## 2018-03-28 DIAGNOSIS — Z79899 Other long term (current) drug therapy: Secondary | ICD-10-CM | POA: Diagnosis not present

## 2018-03-28 DIAGNOSIS — Z7982 Long term (current) use of aspirin: Secondary | ICD-10-CM | POA: Diagnosis not present

## 2018-03-28 DIAGNOSIS — Z955 Presence of coronary angioplasty implant and graft: Secondary | ICD-10-CM

## 2018-03-28 DIAGNOSIS — I214 Non-ST elevation (NSTEMI) myocardial infarction: Secondary | ICD-10-CM

## 2018-03-28 DIAGNOSIS — I10 Essential (primary) hypertension: Secondary | ICD-10-CM | POA: Diagnosis not present

## 2018-03-28 DIAGNOSIS — E785 Hyperlipidemia, unspecified: Secondary | ICD-10-CM | POA: Diagnosis not present

## 2018-03-31 ENCOUNTER — Encounter (HOSPITAL_COMMUNITY): Payer: Medicare Other

## 2018-03-31 ENCOUNTER — Encounter (HOSPITAL_COMMUNITY)
Admission: RE | Admit: 2018-03-31 | Discharge: 2018-03-31 | Disposition: A | Discharge status: Home / Self Care | Payer: Medicare Other | Source: Ambulatory Visit | Attending: Cardiology | Admitting: Cardiology

## 2018-03-31 DIAGNOSIS — Z955 Presence of coronary angioplasty implant and graft: Secondary | ICD-10-CM | POA: Diagnosis not present

## 2018-03-31 DIAGNOSIS — I214 Non-ST elevation (NSTEMI) myocardial infarction: Secondary | ICD-10-CM

## 2018-03-31 DIAGNOSIS — I10 Essential (primary) hypertension: Secondary | ICD-10-CM | POA: Diagnosis not present

## 2018-03-31 DIAGNOSIS — I2510 Atherosclerotic heart disease of native coronary artery without angina pectoris: Secondary | ICD-10-CM | POA: Diagnosis not present

## 2018-03-31 DIAGNOSIS — E785 Hyperlipidemia, unspecified: Secondary | ICD-10-CM | POA: Diagnosis not present

## 2018-03-31 DIAGNOSIS — Z7982 Long term (current) use of aspirin: Secondary | ICD-10-CM | POA: Diagnosis not present

## 2018-03-31 DIAGNOSIS — Z79899 Other long term (current) drug therapy: Secondary | ICD-10-CM | POA: Diagnosis not present

## 2018-04-02 ENCOUNTER — Encounter (HOSPITAL_COMMUNITY): Payer: Medicare Other

## 2018-04-02 ENCOUNTER — Encounter (HOSPITAL_COMMUNITY)
Admission: RE | Admit: 2018-04-02 | Discharge: 2018-04-02 | Disposition: A | Discharge status: Home / Self Care | Payer: Medicare Other | Source: Ambulatory Visit | Attending: Cardiology | Admitting: Cardiology

## 2018-04-02 DIAGNOSIS — Z7982 Long term (current) use of aspirin: Secondary | ICD-10-CM | POA: Diagnosis not present

## 2018-04-02 DIAGNOSIS — E785 Hyperlipidemia, unspecified: Secondary | ICD-10-CM | POA: Diagnosis not present

## 2018-04-02 DIAGNOSIS — Z955 Presence of coronary angioplasty implant and graft: Secondary | ICD-10-CM | POA: Diagnosis not present

## 2018-04-02 DIAGNOSIS — I2510 Atherosclerotic heart disease of native coronary artery without angina pectoris: Secondary | ICD-10-CM | POA: Diagnosis not present

## 2018-04-02 DIAGNOSIS — I10 Essential (primary) hypertension: Secondary | ICD-10-CM | POA: Diagnosis not present

## 2018-04-02 DIAGNOSIS — Z79899 Other long term (current) drug therapy: Secondary | ICD-10-CM | POA: Diagnosis not present

## 2018-04-04 ENCOUNTER — Encounter (HOSPITAL_COMMUNITY): Payer: Medicare Other

## 2018-04-04 ENCOUNTER — Encounter (HOSPITAL_COMMUNITY)
Admission: RE | Admit: 2018-04-04 | Discharge: 2018-04-04 | Disposition: A | Discharge status: Home / Self Care | Payer: Medicare Other | Source: Ambulatory Visit | Attending: Cardiology | Admitting: Cardiology

## 2018-04-04 DIAGNOSIS — Z955 Presence of coronary angioplasty implant and graft: Secondary | ICD-10-CM

## 2018-04-04 DIAGNOSIS — Z7982 Long term (current) use of aspirin: Secondary | ICD-10-CM | POA: Diagnosis not present

## 2018-04-04 DIAGNOSIS — Z79899 Other long term (current) drug therapy: Secondary | ICD-10-CM | POA: Diagnosis not present

## 2018-04-04 DIAGNOSIS — I2510 Atherosclerotic heart disease of native coronary artery without angina pectoris: Secondary | ICD-10-CM | POA: Diagnosis not present

## 2018-04-04 DIAGNOSIS — I214 Non-ST elevation (NSTEMI) myocardial infarction: Secondary | ICD-10-CM

## 2018-04-04 DIAGNOSIS — E785 Hyperlipidemia, unspecified: Secondary | ICD-10-CM | POA: Diagnosis not present

## 2018-04-04 DIAGNOSIS — I10 Essential (primary) hypertension: Secondary | ICD-10-CM | POA: Diagnosis not present

## 2018-04-07 ENCOUNTER — Encounter (HOSPITAL_COMMUNITY): Payer: Medicare Other

## 2018-04-07 ENCOUNTER — Encounter (HOSPITAL_COMMUNITY)
Admission: RE | Admit: 2018-04-07 | Discharge: 2018-04-07 | Disposition: A | Discharge status: Home / Self Care | Payer: Medicare Other | Source: Ambulatory Visit | Attending: Cardiology | Admitting: Cardiology

## 2018-04-07 ENCOUNTER — Ambulatory Visit: Payer: Medicare Other | Admitting: Neurology

## 2018-04-07 ENCOUNTER — Encounter

## 2018-04-07 DIAGNOSIS — Z955 Presence of coronary angioplasty implant and graft: Secondary | ICD-10-CM

## 2018-04-07 DIAGNOSIS — E785 Hyperlipidemia, unspecified: Secondary | ICD-10-CM | POA: Diagnosis not present

## 2018-04-07 DIAGNOSIS — Z79899 Other long term (current) drug therapy: Secondary | ICD-10-CM | POA: Diagnosis not present

## 2018-04-07 DIAGNOSIS — M48062 Spinal stenosis, lumbar region with neurogenic claudication: Secondary | ICD-10-CM | POA: Diagnosis not present

## 2018-04-07 DIAGNOSIS — I10 Essential (primary) hypertension: Secondary | ICD-10-CM | POA: Diagnosis not present

## 2018-04-07 DIAGNOSIS — Z7982 Long term (current) use of aspirin: Secondary | ICD-10-CM | POA: Diagnosis not present

## 2018-04-07 DIAGNOSIS — I2510 Atherosclerotic heart disease of native coronary artery without angina pectoris: Secondary | ICD-10-CM | POA: Diagnosis not present

## 2018-04-07 DIAGNOSIS — I214 Non-ST elevation (NSTEMI) myocardial infarction: Secondary | ICD-10-CM

## 2018-04-08 ENCOUNTER — Telehealth: Payer: Self-pay

## 2018-04-08 NOTE — Telephone Encounter (Signed)
   [facility] Pre-operative Risk Assessment    Request for surgical clearance:  1. What type of surgery is being performed? L1-4 Laminectomy   2. When is this surgery scheduled?  TBD   3. What type of clearance is required (medical clearance vs. Pharmacy clearance to hold med vs. Both)? Both  4. Are there any medications that need to be held prior to surgery and how long? ASA/Brilinta   5. Practice name and name of physician performing surgery? [facility]- Dr. Ronnald Ramp   6. What is your office phone number [phone] ext [phone]    7.   What is your office fax number [phone]  8.   Anesthesia type (None, local, MAC, general) ? Unknown   Meryl Crutch 04/08/2018, 8:59 AM  _________________________________________________________________   (provider comments below)

## 2018-04-09 ENCOUNTER — Encounter (HOSPITAL_COMMUNITY)
Admission: RE | Admit: 2018-04-09 | Discharge: 2018-04-09 | Disposition: A | Discharge status: Home / Self Care | Payer: Medicare Other | Source: Ambulatory Visit | Attending: Cardiology | Admitting: Cardiology

## 2018-04-09 ENCOUNTER — Encounter (HOSPITAL_COMMUNITY): Payer: Medicare Other

## 2018-04-09 DIAGNOSIS — Z79899 Other long term (current) drug therapy: Secondary | ICD-10-CM | POA: Diagnosis not present

## 2018-04-09 DIAGNOSIS — E785 Hyperlipidemia, unspecified: Secondary | ICD-10-CM | POA: Diagnosis not present

## 2018-04-09 DIAGNOSIS — Z7982 Long term (current) use of aspirin: Secondary | ICD-10-CM | POA: Diagnosis not present

## 2018-04-09 DIAGNOSIS — I214 Non-ST elevation (NSTEMI) myocardial infarction: Secondary | ICD-10-CM

## 2018-04-09 DIAGNOSIS — I2510 Atherosclerotic heart disease of native coronary artery without angina pectoris: Secondary | ICD-10-CM | POA: Diagnosis not present

## 2018-04-09 DIAGNOSIS — Z955 Presence of coronary angioplasty implant and graft: Secondary | ICD-10-CM

## 2018-04-09 DIAGNOSIS — I10 Essential (primary) hypertension: Secondary | ICD-10-CM | POA: Diagnosis not present

## 2018-04-10 NOTE — Telephone Encounter (Signed)
Please see my last note -- unless urgent, would recommend waiting ~6 months post PCI (would mean Sept).  At that point, would be OK to temporarily suspend Brilinta (7 days) & ASA 5-7 days pre-op. Then restart 1-2 days post OP - Brilinta alone.  Glenetta Hew, MD

## 2018-04-11 ENCOUNTER — Encounter (HOSPITAL_COMMUNITY)
Admission: RE | Admit: 2018-04-11 | Discharge: 2018-04-11 | Disposition: A | Discharge status: Home / Self Care | Payer: Medicare Other | Source: Ambulatory Visit | Attending: Cardiology | Admitting: Cardiology

## 2018-04-11 ENCOUNTER — Encounter (HOSPITAL_COMMUNITY): Payer: Medicare Other

## 2018-04-11 DIAGNOSIS — Z955 Presence of coronary angioplasty implant and graft: Secondary | ICD-10-CM | POA: Diagnosis not present

## 2018-04-11 DIAGNOSIS — Z7982 Long term (current) use of aspirin: Secondary | ICD-10-CM | POA: Diagnosis not present

## 2018-04-11 DIAGNOSIS — E785 Hyperlipidemia, unspecified: Secondary | ICD-10-CM | POA: Diagnosis not present

## 2018-04-11 DIAGNOSIS — Z79899 Other long term (current) drug therapy: Secondary | ICD-10-CM | POA: Diagnosis not present

## 2018-04-11 DIAGNOSIS — I214 Non-ST elevation (NSTEMI) myocardial infarction: Secondary | ICD-10-CM

## 2018-04-11 DIAGNOSIS — I10 Essential (primary) hypertension: Secondary | ICD-10-CM | POA: Diagnosis not present

## 2018-04-11 DIAGNOSIS — I2510 Atherosclerotic heart disease of native coronary artery without angina pectoris: Secondary | ICD-10-CM | POA: Diagnosis not present

## 2018-04-11 NOTE — Telephone Encounter (Signed)
   Primary Cardiologist: Dr Ellyn Hack  Chart reviewed as part of pre-operative protocol coverage by Dr Ellyn Hack-   "Unless urgent, would recommend waiting ~6 months post PCI which would mean after Sept 11 2019. At that point, it would be OK to temporarily suspend Brilinta (7 days) & ASA 5-7 days pre-op. Then restart Brilinta alone  1-2 days post OP".   The patient has an appointment with dr Ellyn Hack 05/26/18 and final pre clearance can be given then.   Pre-op covering staff: - Please contact requesting surgeon's office via preferred method (i.e, phone, fax) to inform them of our recommendations.  Kerin Ransom, PA-C 04/11/2018, 8:50 AM

## 2018-04-14 ENCOUNTER — Encounter (HOSPITAL_COMMUNITY)
Admission: RE | Admit: 2018-04-14 | Discharge: 2018-04-14 | Disposition: A | Discharge status: Home / Self Care | Payer: Medicare Other | Source: Ambulatory Visit | Attending: Cardiology | Admitting: Cardiology

## 2018-04-14 ENCOUNTER — Encounter (HOSPITAL_COMMUNITY): Payer: Medicare Other

## 2018-04-14 DIAGNOSIS — I10 Essential (primary) hypertension: Secondary | ICD-10-CM | POA: Diagnosis not present

## 2018-04-14 DIAGNOSIS — I2510 Atherosclerotic heart disease of native coronary artery without angina pectoris: Secondary | ICD-10-CM | POA: Diagnosis not present

## 2018-04-14 DIAGNOSIS — E785 Hyperlipidemia, unspecified: Secondary | ICD-10-CM | POA: Diagnosis not present

## 2018-04-14 DIAGNOSIS — I214 Non-ST elevation (NSTEMI) myocardial infarction: Secondary | ICD-10-CM

## 2018-04-14 DIAGNOSIS — Z7982 Long term (current) use of aspirin: Secondary | ICD-10-CM | POA: Diagnosis not present

## 2018-04-14 DIAGNOSIS — Z955 Presence of coronary angioplasty implant and graft: Secondary | ICD-10-CM

## 2018-04-14 DIAGNOSIS — E538 Deficiency of other specified B group vitamins: Secondary | ICD-10-CM | POA: Diagnosis not present

## 2018-04-14 DIAGNOSIS — Z79899 Other long term (current) drug therapy: Secondary | ICD-10-CM | POA: Diagnosis not present

## 2018-04-16 ENCOUNTER — Encounter (HOSPITAL_COMMUNITY): Payer: Medicare Other

## 2018-04-16 ENCOUNTER — Encounter (HOSPITAL_COMMUNITY)
Admission: RE | Admit: 2018-04-16 | Discharge: 2018-04-16 | Disposition: A | Discharge status: Home / Self Care | Payer: Medicare Other | Source: Ambulatory Visit | Attending: Cardiology | Admitting: Cardiology

## 2018-04-16 DIAGNOSIS — I2510 Atherosclerotic heart disease of native coronary artery without angina pectoris: Secondary | ICD-10-CM | POA: Diagnosis not present

## 2018-04-16 DIAGNOSIS — Z955 Presence of coronary angioplasty implant and graft: Secondary | ICD-10-CM

## 2018-04-16 DIAGNOSIS — I214 Non-ST elevation (NSTEMI) myocardial infarction: Secondary | ICD-10-CM

## 2018-04-16 DIAGNOSIS — Z7982 Long term (current) use of aspirin: Secondary | ICD-10-CM | POA: Diagnosis not present

## 2018-04-16 DIAGNOSIS — E785 Hyperlipidemia, unspecified: Secondary | ICD-10-CM | POA: Diagnosis not present

## 2018-04-16 DIAGNOSIS — Z79899 Other long term (current) drug therapy: Secondary | ICD-10-CM | POA: Diagnosis not present

## 2018-04-16 DIAGNOSIS — I10 Essential (primary) hypertension: Secondary | ICD-10-CM | POA: Diagnosis not present

## 2018-04-18 ENCOUNTER — Encounter (HOSPITAL_COMMUNITY)
Admission: RE | Admit: 2018-04-18 | Discharge: 2018-04-18 | Disposition: A | Discharge status: Home / Self Care | Payer: Medicare Other | Source: Ambulatory Visit | Attending: Cardiology | Admitting: Cardiology

## 2018-04-18 ENCOUNTER — Encounter (HOSPITAL_COMMUNITY): Payer: Medicare Other

## 2018-04-18 DIAGNOSIS — Z7902 Long term (current) use of antithrombotics/antiplatelets: Secondary | ICD-10-CM | POA: Diagnosis not present

## 2018-04-18 DIAGNOSIS — E785 Hyperlipidemia, unspecified: Secondary | ICD-10-CM | POA: Insufficient documentation

## 2018-04-18 DIAGNOSIS — I10 Essential (primary) hypertension: Secondary | ICD-10-CM | POA: Insufficient documentation

## 2018-04-18 DIAGNOSIS — Z7982 Long term (current) use of aspirin: Secondary | ICD-10-CM | POA: Insufficient documentation

## 2018-04-18 DIAGNOSIS — Z79899 Other long term (current) drug therapy: Secondary | ICD-10-CM | POA: Insufficient documentation

## 2018-04-18 DIAGNOSIS — Z955 Presence of coronary angioplasty implant and graft: Secondary | ICD-10-CM | POA: Insufficient documentation

## 2018-04-18 DIAGNOSIS — I2510 Atherosclerotic heart disease of native coronary artery without angina pectoris: Secondary | ICD-10-CM | POA: Insufficient documentation

## 2018-04-18 DIAGNOSIS — I214 Non-ST elevation (NSTEMI) myocardial infarction: Secondary | ICD-10-CM

## 2018-04-21 ENCOUNTER — Encounter (HOSPITAL_COMMUNITY)
Admission: RE | Admit: 2018-04-21 | Discharge: 2018-04-21 | Disposition: A | Discharge status: Home / Self Care | Payer: Medicare Other | Source: Ambulatory Visit | Attending: Cardiology | Admitting: Cardiology

## 2018-04-21 ENCOUNTER — Encounter (HOSPITAL_COMMUNITY): Payer: Medicare Other

## 2018-04-21 DIAGNOSIS — Z955 Presence of coronary angioplasty implant and graft: Secondary | ICD-10-CM | POA: Diagnosis not present

## 2018-04-21 DIAGNOSIS — I214 Non-ST elevation (NSTEMI) myocardial infarction: Secondary | ICD-10-CM

## 2018-04-21 DIAGNOSIS — I10 Essential (primary) hypertension: Secondary | ICD-10-CM | POA: Diagnosis not present

## 2018-04-21 DIAGNOSIS — Z79899 Other long term (current) drug therapy: Secondary | ICD-10-CM | POA: Diagnosis not present

## 2018-04-21 DIAGNOSIS — Z7982 Long term (current) use of aspirin: Secondary | ICD-10-CM | POA: Diagnosis not present

## 2018-04-21 DIAGNOSIS — E785 Hyperlipidemia, unspecified: Secondary | ICD-10-CM | POA: Diagnosis not present

## 2018-04-21 DIAGNOSIS — I2510 Atherosclerotic heart disease of native coronary artery without angina pectoris: Secondary | ICD-10-CM | POA: Diagnosis not present

## 2018-04-23 ENCOUNTER — Encounter (HOSPITAL_COMMUNITY): Payer: Medicare Other

## 2018-04-23 ENCOUNTER — Encounter (HOSPITAL_COMMUNITY)
Admission: RE | Admit: 2018-04-23 | Discharge: 2018-04-23 | Disposition: A | Discharge status: Home / Self Care | Payer: Medicare Other | Source: Ambulatory Visit | Attending: Cardiology | Admitting: Cardiology

## 2018-04-23 ENCOUNTER — Encounter (HOSPITAL_COMMUNITY): Payer: Self-pay

## 2018-04-23 DIAGNOSIS — I10 Essential (primary) hypertension: Secondary | ICD-10-CM | POA: Diagnosis not present

## 2018-04-23 DIAGNOSIS — E785 Hyperlipidemia, unspecified: Secondary | ICD-10-CM | POA: Diagnosis not present

## 2018-04-23 DIAGNOSIS — Z7982 Long term (current) use of aspirin: Secondary | ICD-10-CM | POA: Diagnosis not present

## 2018-04-23 DIAGNOSIS — Z955 Presence of coronary angioplasty implant and graft: Secondary | ICD-10-CM

## 2018-04-23 DIAGNOSIS — I2510 Atherosclerotic heart disease of native coronary artery without angina pectoris: Secondary | ICD-10-CM | POA: Diagnosis not present

## 2018-04-23 DIAGNOSIS — I214 Non-ST elevation (NSTEMI) myocardial infarction: Secondary | ICD-10-CM

## 2018-04-23 DIAGNOSIS — Z79899 Other long term (current) drug therapy: Secondary | ICD-10-CM | POA: Diagnosis not present

## 2018-04-24 ENCOUNTER — Other Ambulatory Visit: Payer: Self-pay | Admitting: Neurological Surgery

## 2018-04-24 NOTE — Progress Notes (Signed)
Cardiac Individual Treatment Plan  Patient Details  Name: Noah Scott MRN: [id number] Date of Birth: 06-01-1936 Referring Provider:     CARDIAC REHAB PHASE II ORIENTATION from 02/04/2018 in [facility]  Referring Provider  Glenetta Hew, MD.      Initial Encounter Date:    CARDIAC REHAB PHASE II ORIENTATION from 02/04/2018 in [facility]  Date  02/04/18      Visit Diagnosis: NSTEMI (non-ST elevated myocardial infarction) ([place])  11/25/17 DES x3 RCA  Patient's Home Medications on Admission:  Current Outpatient Medications:  .  Aromatic Inhalants (VICKS VAPOR INHALER IN), Inhale 1 Inhaler into the lungs as needed (stuffy nose)., Disp: , Rfl:  .  aspirin EC 81 MG tablet, Take 81 mg by mouth daily., Disp: , Rfl:  .  BRILINTA 90 MG TABS tablet, TAKE 1 TABLET(90 MG) BY MOUTH TWICE DAILY, Disp: 60 tablet, Rfl: 0 .  doxazosin (CARDURA) 8 MG tablet, Take 8 mg by mouth daily., Disp: , Rfl:  .  ezetimibe (ZETIA) 10 MG tablet, Take 1 tablet (10 mg total) by mouth daily., Disp: 30 tablet, Rfl: 3 .  furosemide (LASIX) 20 MG tablet, Take 1 tablet (20 mg total) by mouth daily., Disp: 30 tablet, Rfl: 11 .  losartan (COZAAR) 50 MG tablet, Take 1 tablet (50 mg total) by mouth daily., Disp: 30 tablet, Rfl: 11 .  metoprolol tartrate (LOPRESSOR) 25 MG tablet, Take 0.5 tablets (12.5 mg total) by mouth 2 (two) times daily., Disp: 30 tablet, Rfl: 3 .  nitroGLYCERIN (NITROSTAT) 0.4 MG SL tablet, Place 1 tablet (0.4 mg total) under the tongue every 5 (five) minutes as needed for chest pain., Disp: 25 tablet, Rfl: 3 .  pantoprazole (PROTONIX) 40 MG tablet, Take 1 tablet (40 mg total) by mouth daily., Disp: 30 tablet, Rfl: 3 .  potassium chloride SA (K-DUR,KLOR-CON) 20 MEQ tablet, Take 1 tablet (20 mEq total) by mouth daily., Disp: 30 tablet, Rfl: 11 .  rosuvastatin (CRESTOR) 40 MG tablet, Take 1 tablet (40 mg total) by mouth daily., Disp: 30 tablet,  Rfl: 3 .  tizanidine (ZANAFLEX) 2 MG capsule, Take 1 capsule (2 mg total) by mouth at bedtime., Disp: 30 capsule, Rfl: 5  Past Medical History: Past Medical History:  Diagnosis Date  . CAD S/P percutaneous coronary angioplasty 2001   a) 2001 ([place]): PCI OM1.;; b) NSTEMI 11/2017 - ost-prox RCA 75-65% (Overlapping ONYX DES 3.0 x 26 & 3.0 x 8 --> 3.5-3.3 mm). m-dRCA 95% (ONYX DES 2.75 x 18 mm -> 3.0 mm). Ost OM1 65% ISR (med Rx). dRCA-OstRDA (50%-25%)  . Essential (primary) hypertension 11/21/2017  . Hyperlipidemia 11/21/2017  . Neurogenic claudication due to lumbar spinal stenosis 02/06/2018  . NSTEMI (non-ST elevated myocardial infarction) ([place]) 11/21/2017    Tobacco Use: Social History   Tobacco Use  Smoking Status Never Smoker  Smokeless Tobacco Never Used    Labs: Recent Review Flowsheet Data    There is no flowsheet data to display.      Capillary Blood Glucose: No results found for: GLUCAP   Exercise Target Goals:    Exercise Program Goal: Individual exercise prescription set using results from initial 6 min walk test and THRR while considering  patient's activity barriers and safety.   Exercise Prescription Goal: Starting with aerobic activity 30 plus minutes a day, 3 days per week for initial exercise prescription. Provide home exercise prescription and guidelines that participant acknowledges understanding prior to discharge.  Activity Barriers & Risk Stratification: Activity Barriers & Cardiac Risk Stratification - 02/04/18 0740      Activity Barriers & Cardiac Risk Stratification   Activity Barriers  Back Problems;Shortness of Breath   Chronic lower back pain that radiates to both legs.   Cardiac Risk Stratification  High       6 Minute Walk: 6 Minute Walk    Row Name 02/04/18 0920         6 Minute Walk   Phase  Initial     Distance  1000 feet     Walk Time  6 minutes Stopped at 5:30 due to back/leg pain     # of Rest Breaks  1 30 seconds from 5:30  to 6:00     MPH  1.89     METS  1.63     RPE  13     Perceived Dyspnea   2     VO2 Peak  5.72     Symptoms  Yes (comment)     Comments  Patient c/o SOB during walk test. Pt stopped at 5:30 due to lower back and leg pain, which he rated 7/10 on the pain scale.     Resting HR  85 bpm     Resting BP  114/60     Resting Oxygen Saturation   98 %     Exercise Oxygen Saturation  during 6 min walk  97 %     Max Ex. HR  122 bpm     Max Ex. BP  126/70     2 Minute Post BP  114/70        Oxygen Initial Assessment:   Oxygen Re-Evaluation:   Oxygen Discharge (Final Oxygen Re-Evaluation):   Initial Exercise Prescription: Initial Exercise Prescription - 02/04/18 1000      Date of Initial Exercise RX and Referring Provider   Date  02/04/18    Referring Provider  Glenetta Hew, MD.      Recumbant Bike   Level  1    Watts  15    Minutes  10    METs  2.45      NuStep   Level  1    SPM  85    Minutes  10    METs  2      Arm Ergometer   Level  1    Watts  20    Minutes  5    METs  2      Track   Laps  5    Minutes  5    METs  2.74      Prescription Details   Frequency (times per week)  3    Duration  Progress to 30 minutes of continuous aerobic without signs/symptoms of physical distress      Intensity   THRR 40-80% of Max Heartrate  56-113    Ratings of Perceived Exertion  11-13    Perceived Dyspnea  0-4      Progression   Progression  Continue to progress workloads to maintain intensity without signs/symptoms of physical distress.      Resistance Training   Training Prescription  Yes    Weight  2lbs    Reps  10-15       Perform Capillary Blood Glucose checks as needed.  Exercise Prescription Changes: Exercise Prescription Changes    Row Name 02/12/18 1400 02/17/18 1347 02/24/18 1400 03/10/18 1600 03/27/18 1400     Response to Exercise  Blood Pressure (Admit)  110/70  104/62  122/70  118/60  114/62   Blood Pressure (Exercise)  152/70  124/84  138/70   132/64  112/64   Blood Pressure (Exit)  118/68  100/64  96/60  106/64  104/66   Heart Rate (Admit)  71 bpm  86 bpm  78 bpm  76 bpm  73 bpm   Heart Rate (Exercise)  117 bpm  113 bpm  101 bpm  95 bpm  113 bpm   Heart Rate (Exit)  81 bpm  86 bpm  72 bpm  88 bpm  70 bpm   Rating of Perceived Exertion (Exercise)  11  12  11  12  12    Perceived Dyspnea (Exercise)  0  0  0  0  0   Symptoms  None  None   None  None  None   Comments  Pt oriented to exercise equipment  -  -  -  -   Duration  Progress to 30 minutes of  aerobic without signs/symptoms of physical distress  Continue with 30 min of aerobic exercise without signs/symptoms of physical distress.  Continue with 30 min of aerobic exercise without signs/symptoms of physical distress.  Continue with 30 min of aerobic exercise without signs/symptoms of physical distress.  Continue with 30 min of aerobic exercise without signs/symptoms of physical distress.   Intensity  THRR New  THRR unchanged  THRR unchanged  THRR unchanged  THRR unchanged     Progression   Progression  Continue to progress workloads to maintain intensity without signs/symptoms of physical distress.  Continue to progress workloads to maintain intensity without signs/symptoms of physical distress.  Continue to progress workloads to maintain intensity without signs/symptoms of physical distress.  Continue to progress workloads to maintain intensity without signs/symptoms of physical distress.  Continue to progress workloads to maintain intensity without signs/symptoms of physical distress.   Average METs  2.5  2.78  2.65  2.6  3.06     Resistance Training   Training Prescription  No  Yes  Yes  Yes  No   Weight  -  2lbs  2lbs  3lbs  -   Reps  -  10-15  10-15  10-15  -   Time  -  10 Minutes  10 Minutes  10 Minutes  -     Interval Training   Interval Training  -  No  No  No  No     Recumbant Bike   Level  1  2  2  2   -   Watts  21  15  32  40  -   Minutes  15  10  10  10   -   METs   2.6  2.5  2.9  2.8  -     NuStep   Level  1  2  2  4  5    SPM  85  85  85  95  115   Minutes  15  10  10  10  10    METs  1  2.1  2.4  2.6  4.1     Arm Ergometer   Level  -  1  1  2  2    Watts  -  48  10  14  18    Minutes  -  10  5  10  10    METs  -  2.1  1.5  2.4  2.3  Track   Laps  -  -  4  -  -   Minutes  -  -  5  -  -   METs  -  -  1.7  -  -     Home Exercise Plan   Plans to continue exercise at  -  -  Home (comment)  Home (comment) Walking  Home (comment) Walking   Frequency  -  -  Add 2 additional days to program exercise sessions.  Add 2 additional days to program exercise sessions.  Add 2 additional days to program exercise sessions.   Initial Home Exercises Provided  -  -  02/24/18  02/24/18  02/24/18   Row Name 04/09/18 1403 04/21/18 1359           Response to Exercise   Blood Pressure (Admit)  122/70  108/80      Blood Pressure (Exercise)  132/66  118/72      Blood Pressure (Exit)  102/70  108/70      Heart Rate (Admit)  73 bpm  75 bpm      Heart Rate (Exercise)  103 bpm  94 bpm      Heart Rate (Exit)  80 bpm  74 bpm      Rating of Perceived Exertion (Exercise)  11  12      Perceived Dyspnea (Exercise)  0  0      Symptoms  None   Back Pain      Duration  Progress to 30 minutes of  aerobic without signs/symptoms of physical distress  Progress to 30 minutes of  aerobic without signs/symptoms of physical distress      Intensity  THRR unchanged  THRR unchanged        Progression   Progression  Continue to progress workloads to maintain intensity without signs/symptoms of physical distress.  Continue to progress workloads to maintain intensity without signs/symptoms of physical distress.      Average METs  3.23  3        Resistance Training   Training Prescription  No  Yes      Weight  -  3lbs      Reps  -  10-15      Time  -  10 Minutes        Interval Training   Interval Training  No  No        Recumbant Bike   Level  3.2  3.2      Watts  40  40       Minutes  10  10      METs  3.2  3.2        NuStep   Level  5  6      SPM  115  115      Minutes  10  10      METs  4.4  4.3        Arm Ergometer   Level  2  2      Watts  20  20      Minutes  10  10      METs  2.2  2.2        Home Exercise Plan   Plans to continue exercise at  Home (comment) Walking   Home (comment) Stretches, Stopped Walking due to Back Pain      Frequency  Add 2 additional days to program exercise sessions.  Add 2 additional days  to program exercise sessions.      Initial Home Exercises Provided  02/24/18  02/24/18         Exercise Comments: Exercise Comments    Row Name 02/12/18 1412 02/24/18 1435 03/27/18 1441 04/24/18 1401     Exercise Comments  Today was pt's first day of exercise. Pt oriented to exercise equipment. Will continue to monitor and progress pt.   Reviewed HEP with pt. Pt is not currently exercising at home, but has access to gym and pool. Will continue to monitor and progress pt as tolerated.   Reviewed METs and Goals with pt. Pt is slowly progressing in rehab despite limitations with leg pain. Will continue to work with pt to progress workloads as tolerated.   Reviewed METs with goals. Pt is scheduled to have back surgery next month. Pt will be graduating from Cardiac Rehab in a week. Pt is not progressing consistently due to chronic back pain. Will continue to work with pt and monitor for the remainder of rehab.        Exercise Goals and Review: Exercise Goals    Row Name 02/04/18 0958             Exercise Goals   Increase Physical Activity  Yes       Intervention  Provide advice, education, support and counseling about physical activity/exercise needs.;Develop an individualized exercise prescription for aerobic and resistive training based on initial evaluation findings, risk stratification, comorbidities and participant's personal goals.       Expected Outcomes  Short Term: Attend rehab on a regular basis to increase amount of  physical activity.;Long Term: Exercising regularly at least 3-5 days a week.;Long Term: Add in home exercise to make exercise part of routine and to increase amount of physical activity.       Increase Strength and Stamina  Yes       Intervention  Develop an individualized exercise prescription for aerobic and resistive training based on initial evaluation findings, risk stratification, comorbidities and participant's personal goals.       Expected Outcomes  Short Term: Increase workloads from initial exercise prescription for resistance, speed, and METs.;Short Term: Perform resistance training exercises routinely during rehab and add in resistance training at home;Long Term: Improve cardiorespiratory fitness, muscular endurance and strength as measured by increased METs and functional capacity (6MWT)       Able to understand and use rate of perceived exertion (RPE) scale  Yes       Intervention  Provide education and explanation on how to use RPE scale       Expected Outcomes  Short Term: Able to use RPE daily in rehab to express subjective intensity level;Long Term:  Able to use RPE to guide intensity level when exercising independently       Knowledge and understanding of Target Heart Rate Range (THRR)  Yes       Intervention  Provide education and explanation of THRR including how the numbers were predicted and where they are located for reference       Expected Outcomes  Short Term: Able to state/look up THRR;Long Term: Able to use THRR to govern intensity when exercising independently;Short Term: Able to use daily as guideline for intensity in rehab       Able to check pulse independently  Yes       Intervention  Provide education and demonstration on how to check pulse in carotid and radial arteries.;Review the importance of being able to check your  own pulse for safety during independent exercise       Expected Outcomes  Short Term: Able to explain why pulse checking is important during  independent exercise;Long Term: Able to check pulse independently and accurately       Understanding of Exercise Prescription  Yes       Intervention  Provide education, explanation, and written materials on patient's individual exercise prescription       Expected Outcomes  Short Term: Able to explain program exercise prescription;Long Term: Able to explain home exercise prescription to exercise independently          Exercise Goals Re-Evaluation : Exercise Goals Re-Evaluation    Row Name 02/24/18 1439 03/27/18 1443 04/24/18 1403         Exercise Goal Re-Evaluation   Exercise Goals Review  Increase Physical Activity;Increase Strength and Stamina;Able to understand and use rate of perceived exertion (RPE) scale;Knowledge and understanding of Target Heart Rate Range (THRR);Able to check pulse independently;Understanding of Exercise Prescription  Increase Physical Activity;Understanding of Exercise Prescription  Increase Physical Activity     Comments  Reviewed HEP with pt. Also reviewed THRR, RPE Scale, weather precautions, NTG use, endpoints of exercise, warmup and cool down. Pt has limitations with leg pain. Pt has follow up with Dr. regarding claudication. Pt advised to work within his limits.    Pt is responding well to exericse prescription. Pt is able to exericse 30 minutes with minimal difficulty. Pt has limited the amount of walking around the track due to leg pain, but has subsituted the track for the Arm Crank.   Pt is very limited with back pain. Pt is not albe to walk for long periods of times. Despite limitations, pt puts forth effort and is still able to exercise 30 minutes with minimal dificulty .     Expected Outcomes  Pt will exercise 1-2 days at home. Pt has a community gym and pool. Pt will work with limitations. Will continue to monitor pt.   Pt will continue to exercise 1-2 days a week in addition to cardiac rehab exercise. Pt will continue to increase cardiorespiratory fitness.  Will continue to monitor.   Pt is not exercising at home. Pt is encouraged to remain active, since time in rehab is coming to an end. Pt is hopefull after back surgery next month, he will be able to do more. Pt has been doing back stretches to help with pain.          Discharge Exercise Prescription (Final Exercise Prescription Changes): Exercise Prescription Changes - 04/21/18 1359      Response to Exercise   Blood Pressure (Admit)  108/80    Blood Pressure (Exercise)  118/72    Blood Pressure (Exit)  108/70    Heart Rate (Admit)  75 bpm    Heart Rate (Exercise)  94 bpm    Heart Rate (Exit)  74 bpm    Rating of Perceived Exertion (Exercise)  12    Perceived Dyspnea (Exercise)  0    Symptoms  Back Pain    Duration  Progress to 30 minutes of  aerobic without signs/symptoms of physical distress    Intensity  THRR unchanged      Progression   Progression  Continue to progress workloads to maintain intensity without signs/symptoms of physical distress.    Average METs  3      Resistance Training   Training Prescription  Yes    Weight  3lbs    Reps  10-15    Time  10 Minutes      Interval Training   Interval Training  No      Recumbant Bike   Level  3.2    Watts  40    Minutes  10    METs  3.2      NuStep   Level  6    SPM  115    Minutes  10    METs  4.3      Arm Ergometer   Level  2    Watts  20    Minutes  10    METs  2.2      Home Exercise Plan   Plans to continue exercise at  Home (comment)   Stretches, Stopped Walking due to Back Pain   Frequency  Add 2 additional days to program exercise sessions.    Initial Home Exercises Provided  02/24/18       Nutrition:  Target Goals: Understanding of nutrition guidelines, daily intake of sodium 1500mg , cholesterol 200mg , calories 30% from fat and 7% or less from saturated fats, daily to have 5 or more servings of fruits and vegetables.  Biometrics: Pre Biometrics - 02/04/18 0740      Pre Biometrics   Height   5\' 8"  (1.727 m)    Weight  104 kg    Waist Circumference  45 inches    Hip Circumference  44.25 inches    Waist to Hip Ratio  1.02 %    BMI (Calculated)  34.87    Triceps Skinfold  13 mm    % Body Fat  32.4 %    Grip Strength  38.5 kg    Flexibility  0 in   Wearing back brace.   Single Leg Stand  4.69 seconds        Nutrition Therapy Plan and Nutrition Goals: Nutrition Therapy & Goals - 02/05/18 1611      Nutrition Therapy   Diet  Heart Healthy      Intervention Plan   Intervention  Prescribe, educate and counsel regarding individualized specific dietary modifications aiming towards targeted core components such as weight, hypertension, lipid management, diabetes, heart failure and other comorbidities.    Expected Outcomes  Short Term Goal: Understand basic principles of dietary content, such as calories, fat, sodium, cholesterol and nutrients.;Long Term Goal: Adherence to prescribed nutrition plan.       Nutrition Assessments: Nutrition Assessments - 02/05/18 1611      MEDFICTS Scores   Pre Score  42       Nutrition Goals Re-Evaluation:   Nutrition Goals Discharge (Final Nutrition Goals Re-Evaluation):   Psychosocial: Target Goals: Acknowledge presence or absence of significant depression and/or stress, maximize coping skills, provide positive support system. Participant is able to verbalize types and ability to use techniques and skills needed for reducing stress and depression.  Initial Review & Psychosocial Screening: Initial Psych Review & Screening - 02/04/18 0749      Initial Review   Current issues with  None Identified      Family Dynamics   Good Support System?  Yes   daughter, grandchildren   Comments  moved to [place] from [place] when granddaughter started [employer]      Barriers   Psychosocial barriers to participate in program  There are no identifiable barriers or psychosocial needs.      Screening Interventions   Interventions   Encouraged to exercise       Quality of  Life Scores: Quality of Life - 02/04/18 0738      Quality of Life Scores   Health/Function Pre  22.8 %    Socioeconomic Pre  24.64 %    Psych/Spiritual Pre  25.5 %    Family Pre  28.5 %    GLOBAL Pre  24.45 %      Scores of 19 and below usually indicate a poorer quality of life in these areas.  A difference of  2-3 points is a clinically meaningful difference.  A difference of 2-3 points in the total score of the Quality of Life Index has been associated with significant improvement in overall quality of life, self-image, physical symptoms, and general health in studies assessing change in quality of life.  PHQ-9: Recent Review Flowsheet Data    Depression screen [facility] 2/9 02/12/2018   Decreased Interest 0   Down, Depressed, Hopeless 0   PHQ - 2 Score 0     Interpretation of Total Score  Total Score Depression Severity:  1-4 = Minimal depression, 5-9 = Mild depression, 10-14 = Moderate depression, 15-19 = Moderately severe depression, 20-27 = Severe depression   Psychosocial Evaluation and Intervention: Psychosocial Evaluation - 02/12/18 1657      Psychosocial Evaluation & Interventions   Interventions  Encouraged to exercise with the program and follow exercise prescription    Comments  No psychosocial needs identified. No interventions necessary. Pt enjoys visiting his daughter everyday.     Expected Outcomes  Pt will exhibit a positive outlook with good coping skills.     Continue Psychosocial Services   No Follow up required       Psychosocial Re-Evaluation: Psychosocial Re-Evaluation    [place] Name 02/27/18 0736 03/26/18 1049 04/23/18 1442         Psychosocial Re-Evaluation   Current issues with  None Identified  None Identified  None Identified     Comments  No psychosocial needs identified. No intervention necessary.  No psychosocial needs identified. No intervention necessary.  No psychosocial needs identified.      Expected  Outcomes  Noah Scott will continue to exhibit a positive outlook utilizing good coping skills.  Noah Scott will continue to exhibit a positive outlook utilizing good coping skills.  Noah Scott will continue to exhibit a positive outlook utilizing good coping skills.     Interventions  Encouraged to attend Cardiac Rehabilitation for the exercise  Encouraged to attend Cardiac Rehabilitation for the exercise  Encouraged to attend Cardiac Rehabilitation for the exercise     Continue Psychosocial Services   No Follow up required  No Follow up required  No Follow up required        Psychosocial Discharge (Final Psychosocial Re-Evaluation): Psychosocial Re-Evaluation - 04/23/18 1442      Psychosocial Re-Evaluation   Current issues with  None Identified    Comments  No psychosocial needs identified.     Expected Outcomes  Noah Scott will continue to exhibit a positive outlook utilizing good coping skills.    Interventions  Encouraged to attend Cardiac Rehabilitation for the exercise    Continue Psychosocial Services   No Follow up required       Vocational Rehabilitation: Provide vocational rehab assistance to qualifying candidates.   Vocational Rehab Evaluation & Intervention: Vocational Rehab - 02/04/18 0749      Initial Vocational Rehab Evaluation & Intervention   Assessment shows need for Vocational Rehabilitation  No   retired       Education: Education Goals: Education classes will be  provided on a weekly basis, covering required topics. Participant will state understanding/return demonstration of topics presented.  Learning Barriers/Preferences: Learning Barriers/Preferences - 02/04/18 1000      Learning Barriers/Preferences   Learning Barriers  Sight    Learning Preferences  Skilled Demonstration;Written Material       Education Topics: Hypertension, Hypertension Reduction -Define heart disease and high blood pressure. Discus how high blood pressure affects the body and ways to reduce  high blood pressure.   Exercise and Your Heart -Discuss why it is important to exercise, the FITT principles of exercise, normal and abnormal responses to exercise, and how to exercise safely.   Angina -Discuss definition of angina, causes of angina, treatment of angina, and how to decrease risk of having angina.   Cardiac Medications -Review what the following cardiac medications are used for, how they affect the body, and side effects that may occur when taking the medications.  Medications include Aspirin, Beta blockers, calcium channel blockers, ACE Inhibitors, angiotensin receptor blockers, diuretics, digoxin, and antihyperlipidemics.   Congestive Heart Failure -Discuss the definition of CHF, how to live with CHF, the signs and symptoms of CHF, and how keep track of weight and sodium intake.   Heart Disease and Intimacy -Discus the effect sexual activity has on the heart, how changes occur during intimacy as we age, and safety during sexual activity.   Smoking Cessation / COPD -Discuss different methods to quit smoking, the health benefits of quitting smoking, and the definition of COPD.   Nutrition I: Fats -Discuss the types of cholesterol, what cholesterol does to the heart, and how cholesterol levels can be controlled.   Nutrition II: Labels -Discuss the different components of food labels and how to read food label   Heart Parts/Heart Disease and PAD -Discuss the anatomy of the heart, the pathway of blood circulation through the heart, and these are affected by heart disease.   Stress I: Signs and Symptoms -Discuss the causes of stress, how stress may lead to anxiety and depression, and ways to limit stress.   Stress II: Relaxation -Discuss different types of relaxation techniques to limit stress.   Warning Signs of Stroke / TIA -Discuss definition of a stroke, what the signs and symptoms are of a stroke, and how to identify when someone is having  stroke.   Knowledge Questionnaire Score: Knowledge Questionnaire Score - 02/04/18 0737      Knowledge Questionnaire Score   Pre Score  18/24       Core Components/Risk Factors/Patient Goals at Admission: Personal Goals and Risk Factors at Admission - 02/04/18 0955      Core Components/Risk Factors/Patient Goals on Admission    Weight Management  Yes;Obesity    Intervention  Weight Management: Develop a combined nutrition and exercise program designed to reach desired caloric intake, while maintaining appropriate intake of nutrient and fiber, sodium and fats, and appropriate energy expenditure required for the weight goal.;Weight Management: Provide education and appropriate resources to help participant work on and attain dietary goals.;Weight Management/Obesity: Establish reasonable short term and long term weight goals.;Obesity: Provide education and appropriate resources to help participant work on and attain dietary goals.    Admit Weight  229 lb 4.5 oz (104 kg)    Goal Weight: Short Term  223 lb (101.2 kg)    Goal Weight: Long Term  199 lb (90.3 kg)    Expected Outcomes  Short Term: Continue to assess and modify interventions until short term weight is achieved;Long Term: Adherence to  nutrition and physical activity/exercise program aimed toward attainment of established weight goal;Weight Loss: Understanding of general recommendations for a balanced deficit meal plan, which promotes 1-2 lb weight loss per week and includes a negative energy balance of [phone] kcal/d;Understanding recommendations for meals to include 15-35% energy as protein, 25-35% energy from fat, 35-60% energy from carbohydrates, less than 200mg  of dietary cholesterol, 20-35 gm of total fiber daily;Understanding of distribution of calorie intake throughout the day with the consumption of 4-5 meals/snacks    Hypertension  Yes    Intervention  Provide education on lifestyle modifcations including regular physical  activity/exercise, weight management, moderate sodium restriction and increased consumption of fresh fruit, vegetables, and low fat dairy, alcohol moderation, and smoking cessation.;Monitor prescription use compliance.    Expected Outcomes  Short Term: Continued assessment and intervention until BP is < 140/81mm HG in hypertensive participants. < 130/71mm HG in hypertensive participants with diabetes, heart failure or chronic kidney disease.;Long Term: Maintenance of blood pressure at goal levels.    Lipids  Yes    Intervention  Provide education and support for participant on nutrition & aerobic/resistive exercise along with prescribed medications to achieve LDL 70mg , HDL >40mg .    Expected Outcomes  Short Term: Participant states understanding of desired cholesterol values and is compliant with medications prescribed. Participant is following exercise prescription and nutrition guidelines.;Long Term: Cholesterol controlled with medications as prescribed, with individualized exercise RX and with personalized nutrition plan. Value goals: LDL < 70mg , HDL > 40 mg.       Core Components/Risk Factors/Patient Goals Review:  Goals and Risk Factor Review    Row Name 02/12/18 1655 02/27/18 0736 03/26/18 1049 04/23/18 1441       Core Components/Risk Factors/Patient Goals Review   Personal Goals Review  Weight Management/Obesity;Lipids;Hypertension  Weight Management/Obesity;Lipids;Hypertension  Weight Management/Obesity;Lipids;Hypertension  Weight Management/Obesity;Lipids;Hypertension    Review  Pt with multiple CAD RF eager to participate in CR exercise. Pt is looking forward to gaining comfort with daily activties.   Pt with multiple CAD RF eager to participate in CR exercise. Noah Scott is tolerating exercise well thus far in the program.   Pt with multiple CAD RF eager to participate in CR exercise. Noah Scott continues to tolerate exercise well.  He is increasing his motivation.  Pt with multiple CAD RF eager  to participate in CR exercise. Noah Scott continues to tolerate exercise well.  He has increased his motivation, energy, and comfort level with exercise. He has back pain which limits him on some machines.     Expected Outcomes  Pt will participate in CR exercise, nutrition, and lifestyle modification.   Pt will participate in CR exercise, nutrition, and lifestyle modification.   Pt will participate in CR exercise, nutrition, and lifestyle modification.   Pt will participate in CR exercise, nutrition, and lifestyle modification.        Core Components/Risk Factors/Patient Goals at Discharge (Final Review):  Goals and Risk Factor Review - 04/23/18 1441      Core Components/Risk Factors/Patient Goals Review   Personal Goals Review  Weight Management/Obesity;Lipids;Hypertension    Review  Pt with multiple CAD RF eager to participate in CR exercise. Noah Scott continues to tolerate exercise well.  He has increased his motivation, energy, and comfort level with exercise. He has back pain which limits him on some machines.     Expected Outcomes  Pt will participate in CR exercise, nutrition, and lifestyle modification.        ITP Comments: ITP Comments  [place] Name 02/04/18 0735 02/27/18 0731 03/26/18 1048 04/23/18 1440     ITP Comments  Medical Director- Dr. Fransico Him, MD  30 Day ITP Review. Pt is off to a good start with exercise.  He is attending education session as well to reduce his risk.  30 Day ITP Review. Mathis is tolerating exercise well.  He feels that he has more motivation now compared to when he started the CR Program.  His attendance is great.  30 Day ITP Review. Noah Scott is tolerating exercise well.  He continues to have increased motivation and energy.  He does struggle from back pain that limits certain exercises.       Comments: See ITP Comments

## 2018-04-25 ENCOUNTER — Encounter (HOSPITAL_COMMUNITY): Payer: Medicare Other

## 2018-04-25 ENCOUNTER — Encounter (HOSPITAL_COMMUNITY)
Admission: RE | Admit: 2018-04-25 | Discharge: 2018-04-25 | Disposition: A | Discharge status: Home / Self Care | Payer: Medicare Other | Source: Ambulatory Visit | Attending: Cardiology | Admitting: Cardiology

## 2018-04-25 DIAGNOSIS — Z7982 Long term (current) use of aspirin: Secondary | ICD-10-CM | POA: Diagnosis not present

## 2018-04-25 DIAGNOSIS — I10 Essential (primary) hypertension: Secondary | ICD-10-CM | POA: Diagnosis not present

## 2018-04-25 DIAGNOSIS — Z955 Presence of coronary angioplasty implant and graft: Secondary | ICD-10-CM | POA: Diagnosis not present

## 2018-04-25 DIAGNOSIS — I2510 Atherosclerotic heart disease of native coronary artery without angina pectoris: Secondary | ICD-10-CM | POA: Diagnosis not present

## 2018-04-25 DIAGNOSIS — E785 Hyperlipidemia, unspecified: Secondary | ICD-10-CM | POA: Diagnosis not present

## 2018-04-25 DIAGNOSIS — Z79899 Other long term (current) drug therapy: Secondary | ICD-10-CM | POA: Diagnosis not present

## 2018-04-25 DIAGNOSIS — I214 Non-ST elevation (NSTEMI) myocardial infarction: Secondary | ICD-10-CM

## 2018-04-28 ENCOUNTER — Encounter (HOSPITAL_COMMUNITY): Payer: Medicare Other

## 2018-04-28 ENCOUNTER — Encounter (HOSPITAL_COMMUNITY)
Admission: RE | Admit: 2018-04-28 | Discharge: 2018-04-28 | Disposition: A | Discharge status: Home / Self Care | Payer: Medicare Other | Source: Ambulatory Visit | Attending: Cardiology | Admitting: Cardiology

## 2018-04-28 DIAGNOSIS — Z955 Presence of coronary angioplasty implant and graft: Secondary | ICD-10-CM

## 2018-04-28 DIAGNOSIS — I10 Essential (primary) hypertension: Secondary | ICD-10-CM | POA: Diagnosis not present

## 2018-04-28 DIAGNOSIS — I214 Non-ST elevation (NSTEMI) myocardial infarction: Secondary | ICD-10-CM

## 2018-04-28 DIAGNOSIS — E785 Hyperlipidemia, unspecified: Secondary | ICD-10-CM | POA: Diagnosis not present

## 2018-04-28 DIAGNOSIS — Z79899 Other long term (current) drug therapy: Secondary | ICD-10-CM | POA: Diagnosis not present

## 2018-04-28 DIAGNOSIS — I2510 Atherosclerotic heart disease of native coronary artery without angina pectoris: Secondary | ICD-10-CM | POA: Diagnosis not present

## 2018-04-28 DIAGNOSIS — Z7982 Long term (current) use of aspirin: Secondary | ICD-10-CM | POA: Diagnosis not present

## 2018-04-30 ENCOUNTER — Encounter (HOSPITAL_COMMUNITY): Payer: Medicare Other

## 2018-04-30 ENCOUNTER — Encounter (HOSPITAL_COMMUNITY)
Admission: RE | Admit: 2018-04-30 | Discharge: 2018-04-30 | Disposition: A | Discharge status: Home / Self Care | Payer: Medicare Other | Source: Ambulatory Visit | Attending: Cardiology | Admitting: Cardiology

## 2018-04-30 VITALS — Ht 68.0 in | Wt 225.3 lb

## 2018-04-30 DIAGNOSIS — I2510 Atherosclerotic heart disease of native coronary artery without angina pectoris: Secondary | ICD-10-CM | POA: Diagnosis not present

## 2018-04-30 DIAGNOSIS — Z955 Presence of coronary angioplasty implant and graft: Secondary | ICD-10-CM | POA: Diagnosis not present

## 2018-04-30 DIAGNOSIS — Z7982 Long term (current) use of aspirin: Secondary | ICD-10-CM | POA: Diagnosis not present

## 2018-04-30 DIAGNOSIS — Z79899 Other long term (current) drug therapy: Secondary | ICD-10-CM | POA: Diagnosis not present

## 2018-04-30 DIAGNOSIS — I10 Essential (primary) hypertension: Secondary | ICD-10-CM | POA: Diagnosis not present

## 2018-04-30 DIAGNOSIS — I214 Non-ST elevation (NSTEMI) myocardial infarction: Secondary | ICD-10-CM

## 2018-04-30 DIAGNOSIS — E785 Hyperlipidemia, unspecified: Secondary | ICD-10-CM | POA: Diagnosis not present

## 2018-05-02 ENCOUNTER — Encounter (HOSPITAL_COMMUNITY)
Admission: RE | Admit: 2018-05-02 | Discharge: 2018-05-02 | Disposition: A | Discharge status: Home / Self Care | Payer: Medicare Other | Source: Ambulatory Visit | Attending: Cardiology | Admitting: Cardiology

## 2018-05-02 ENCOUNTER — Encounter (HOSPITAL_COMMUNITY): Payer: Medicare Other

## 2018-05-02 DIAGNOSIS — I10 Essential (primary) hypertension: Secondary | ICD-10-CM | POA: Diagnosis not present

## 2018-05-02 DIAGNOSIS — I214 Non-ST elevation (NSTEMI) myocardial infarction: Secondary | ICD-10-CM

## 2018-05-02 DIAGNOSIS — Z955 Presence of coronary angioplasty implant and graft: Secondary | ICD-10-CM

## 2018-05-02 DIAGNOSIS — Z79899 Other long term (current) drug therapy: Secondary | ICD-10-CM | POA: Diagnosis not present

## 2018-05-02 DIAGNOSIS — E785 Hyperlipidemia, unspecified: Secondary | ICD-10-CM | POA: Diagnosis not present

## 2018-05-02 DIAGNOSIS — I2510 Atherosclerotic heart disease of native coronary artery without angina pectoris: Secondary | ICD-10-CM | POA: Diagnosis not present

## 2018-05-02 DIAGNOSIS — Z7982 Long term (current) use of aspirin: Secondary | ICD-10-CM | POA: Diagnosis not present

## 2018-05-02 NOTE — Progress Notes (Signed)
Discharge Progress Report  Patient Details  Name: Noah Scott MRN: [id number] Date of Birth: 1936-02-24 Referring Provider:     [place] from 02/04/2018 in [place]  Referring Provider  Glenetta Hew, MD.       Number of Visits: 36  Reason for Discharge:  Patient independent in their exercise.  Smoking History:  Social History   Tobacco Use  Smoking Status Never Smoker  Smokeless Tobacco Never Used    Diagnosis:  NSTEMI (non-ST elevated myocardial infarction) ([place])  11/25/17 DES x3 RCA  ADL [facility]:   Initial Exercise Prescription: Initial Exercise Prescription - 02/04/18 1000      Date of Initial Exercise RX and Referring Provider   Date  02/04/18    Referring Provider  Glenetta Hew, MD.      Recumbant Bike   Level  1    Watts  15    Minutes  10    METs  2.45      NuStep   Level  1    SPM  85    Minutes  10    METs  2      Arm Ergometer   Level  1    Watts  20    Minutes  5    METs  2      Track   Laps  5    Minutes  5    METs  2.74      Prescription Details   Frequency (times per week)  3    Duration  Progress to 30 minutes of continuous aerobic without signs/symptoms of physical distress      Intensity   THRR 40-80% of Max Heartrate  56-113    Ratings of Perceived Exertion  11-13    Perceived Dyspnea  0-4      Progression   Progression  Continue to progress workloads to maintain intensity without signs/symptoms of physical distress.      Resistance Training   Training Prescription  Yes    Weight  2lbs    Reps  10-15       Discharge Exercise Prescription (Final Exercise Prescription Changes): Exercise Prescription Changes - 05/02/18 1400      Response to Exercise   Blood Pressure (Admit)  102/62    Blood Pressure (Exercise)  124/78    Blood Pressure (Exit)  114/70    Heart Rate (Admit)  79 bpm    Heart Rate (Exercise)  107 bpm    Heart Rate (Exit)  87 bpm    Rating  of Perceived Exertion (Exercise)  12    Perceived Dyspnea (Exercise)  0    Symptoms  Back Pain    Comments  Pt graduated Cardiac Rehab    Duration  Continue with 30 min of aerobic exercise without signs/symptoms of physical distress.    Intensity  THRR unchanged      Progression   Progression  Continue to progress workloads to maintain intensity without signs/symptoms of physical distress.    Average METs  3.2      Resistance Training   Training Prescription  No      Recumbant Bike   Level  3.2    Watts  40    Minutes  10    METs  2.4      NuStep   Level  6    SPM  100    Minutes  10    METs  3.8  Arm Ergometer   Level  3.3    Watts  11    Minutes  10    METs  2.5      Home Exercise Plan   Plans to continue exercise at  Home (comment)   Stretching, Stopped Walking due to back pain    Frequency  Add 2 additional days to program exercise sessions.    Initial Home Exercises Provided  02/24/18       Functional Capacity: 6 Minute Walk    Row Name 02/04/18 0920 04/30/18 1438       6 Minute Walk   Phase  Initial  - Pt did not complete 6 Minute Walk Test. Pt declined to do 6MWT, due to chronic pain.     Distance  1000 feet  -    Walk Time  6 minutes Stopped at 5:30 due to back/leg pain  -    # of Rest Breaks  1 30 seconds from 5:30 to 6:00  -    MPH  1.89  -    METS  1.63  -    RPE  13  -    Perceived Dyspnea   2  -    VO2 Peak  5.72  -    Symptoms  Yes (comment)  -    Comments  Patient c/o SOB during walk test. Pt stopped at 5:30 due to lower back and leg pain, which he rated 7/10 on the pain scale.  -    Resting HR  85 bpm  -    Resting BP  114/60  -    Resting Oxygen Saturation   98 %  -    Exercise Oxygen Saturation  during 6 min walk  97 %  -    Max Ex. HR  122 bpm  -    Max Ex. BP  126/70  -    2 Minute Post BP  114/70  -       Psychological, QOL, Others - Outcomes: PHQ 2/9: Depression screen [facility] 2/9 05/02/2018 02/12/2018  Decreased Interest 0 0   Down, Depressed, Hopeless 0 0  PHQ - 2 Score 0 0    Quality of Life: Quality of Life - 05/02/18 1355      Quality of Life   Select  Quality of Life      Quality of Life Scores   Health/Function Pre  22.8 %    Health/Function Post  22.83 %    Health/Function % Change  0.13 %    Socioeconomic Pre  24.64 %    Socioeconomic Post  25.43 %    Socioeconomic % Change   3.21 %    Psych/Spiritual Pre  25.5 %    Psych/Spiritual Post  23.14 %    Psych/Spiritual % Change  -9.25 %    Family Pre  28.5 %    Family Post  30 %    Family % Change  5.26 %    GLOBAL Pre  24.45 %    GLOBAL Post  24.32 %    GLOBAL % Change  -0.53 %       Personal Goals: Goals established at orientation with interventions provided to work toward goal. Personal Goals and Risk Factors at Admission - 02/04/18 0955      Core Components/Risk Factors/Patient Goals on Admission    Weight Management  Yes;Obesity    Intervention  Weight Management: Develop a combined nutrition and exercise program designed to reach desired caloric intake,  while maintaining appropriate intake of nutrient and fiber, sodium and fats, and appropriate energy expenditure required for the weight goal.;Weight Management: Provide education and appropriate resources to help participant work on and attain dietary goals.;Weight Management/Obesity: Establish reasonable short term and long term weight goals.;Obesity: Provide education and appropriate resources to help participant work on and attain dietary goals.    Admit Weight  229 lb 4.5 oz (104 kg)    Goal Weight: Short Term  223 lb (101.2 kg)    Goal Weight: Long Term  199 lb (90.3 kg)    Expected Outcomes  Short Term: Continue to assess and modify interventions until short term weight is achieved;Long Term: Adherence to nutrition and physical activity/exercise program aimed toward attainment of established weight goal;Weight Loss: Understanding of general recommendations for a balanced deficit meal  plan, which promotes 1-2 lb weight loss per week and includes a negative energy balance of [phone] kcal/d;Understanding recommendations for meals to include 15-35% energy as protein, 25-35% energy from fat, 35-60% energy from carbohydrates, less than '200mg'$  of dietary cholesterol, 20-35 gm of total fiber daily;Understanding of distribution of calorie intake throughout the day with the consumption of 4-5 meals/snacks    Hypertension  Yes    Intervention  Provide education on lifestyle modifcations including regular physical activity/exercise, weight management, moderate sodium restriction and increased consumption of fresh fruit, vegetables, and low fat dairy, alcohol moderation, and smoking cessation.;Monitor prescription use compliance.    Expected Outcomes  Short Term: Continued assessment and intervention until BP is < 140/34m HG in hypertensive participants. < 130/864mHG in hypertensive participants with diabetes, heart failure or chronic kidney disease.;Long Term: Maintenance of blood pressure at goal levels.    Lipids  Yes    Intervention  Provide education and support for participant on nutrition & aerobic/resistive exercise along with prescribed medications to achieve LDL '70mg'$ , HDL >'40mg'$ .    Expected Outcomes  Short Term: Participant states understanding of desired cholesterol values and is compliant with medications prescribed. Participant is following exercise prescription and nutrition guidelines.;Long Term: Cholesterol controlled with medications as prescribed, with individualized exercise RX and with personalized nutrition plan. Value goals: LDL < '70mg'$ , HDL > 40 mg.        Personal Goals Discharge: Goals and Risk Factor Review    Row Name 02/12/18 1655 02/27/18 0736 03/26/18 1049 04/23/18 1441 05/02/18 1209     Core Components/Risk Factors/Patient Goals Review   Personal Goals Review  Weight Management/Obesity;Lipids;Hypertension  Weight Management/Obesity;Lipids;Hypertension  Weight  Management/Obesity;Lipids;Hypertension  Weight Management/Obesity;Lipids;Hypertension  Weight Management/Obesity;Lipids;Hypertension   Review  Pt with multiple CAD RF eager to participate in CR exercise. Pt is looking forward to gaining comfort with daily activties.   Pt with multiple CAD RF eager to participate in CR exercise. ThDaneils tolerating exercise well thus far in the program.   Pt with multiple CAD RF eager to participate in CR exercise. ThLadarrianontinues to tolerate exercise well.  He is increasing his motivation.  Pt with multiple CAD RF eager to participate in CR exercise. ThKashmirontinues to tolerate exercise well.  He has increased his motivation, energy, and comfort level with exercise. He has back pain which limits him on some machines.   Tom did well with exercise. Tom's vital signs were stable during his participation in the program   Expected Outcomes  Pt will participate in CR exercise, nutrition, and lifestyle modification.   Pt will participate in CR exercise, nutrition, and lifestyle modification.   Pt will participate in CR  exercise, nutrition, and lifestyle modification.   Pt will participate in CR exercise, nutrition, and lifestyle modification.   Pt will participate in nutrition, lifestyle modification and exercise at his apartment complex upon graduation from cardiac rehab      Exercise Goals and Review: Exercise Goals    Row Name 02/04/18 0958             Exercise Goals   Increase Physical Activity  Yes       Intervention  Provide advice, education, support and counseling about physical activity/exercise needs.;Develop an individualized exercise prescription for aerobic and resistive training based on initial evaluation findings, risk stratification, comorbidities and participant's personal goals.       Expected Outcomes  Short Term: Attend rehab on a regular basis to increase amount of physical activity.;Long Term: Exercising regularly at least 3-5 days a week.;Long  Term: Add in home exercise to make exercise part of routine and to increase amount of physical activity.       Increase Strength and Stamina  Yes       Intervention  Develop an individualized exercise prescription for aerobic and resistive training based on initial evaluation findings, risk stratification, comorbidities and participant's personal goals.       Expected Outcomes  Short Term: Increase workloads from initial exercise prescription for resistance, speed, and METs.;Short Term: Perform resistance training exercises routinely during rehab and add in resistance training at home;Long Term: Improve cardiorespiratory fitness, muscular endurance and strength as measured by increased METs and functional capacity (6MWT)       Able to understand and use rate of perceived exertion (RPE) scale  Yes       Intervention  Provide education and explanation on how to use RPE scale       Expected Outcomes  Short Term: Able to use RPE daily in rehab to express subjective intensity level;Long Term:  Able to use RPE to guide intensity level when exercising independently       Knowledge and understanding of Target Heart Rate Range (THRR)  Yes       Intervention  Provide education and explanation of THRR including how the numbers were predicted and where they are located for reference       Expected Outcomes  Short Term: Able to state/look up THRR;Long Term: Able to use THRR to govern intensity when exercising independently;Short Term: Able to use daily as guideline for intensity in rehab       Able to check pulse independently  Yes       Intervention  Provide education and demonstration on how to check pulse in carotid and radial arteries.;Review the importance of being able to check your own pulse for safety during independent exercise       Expected Outcomes  Short Term: Able to explain why pulse checking is important during independent exercise;Long Term: Able to check pulse independently and accurately        Understanding of Exercise Prescription  Yes       Intervention  Provide education, explanation, and written materials on patient's individual exercise prescription       Expected Outcomes  Short Term: Able to explain program exercise prescription;Long Term: Able to explain home exercise prescription to exercise independently          Nutrition & Weight - Outcomes: Pre Biometrics - 02/04/18 0740      Pre Biometrics   Height  '5\' 8"'$  (1.727 m)    Weight  229 lb 4.5 oz (104  kg)    Waist Circumference  45 inches    Hip Circumference  44.25 inches    Waist to Hip Ratio  1.02 %    BMI (Calculated)  34.87    Triceps Skinfold  13 mm    % Body Fat  32.4 %    Grip Strength  38.5 kg    Flexibility  0 in   Wearing back brace.   Single Leg Stand  4.69 seconds      Post Biometrics - 04/30/18 1440       Post  Biometrics   Height  '5\' 8"'$  (1.727 m)    Weight  225 lb 5 oz (102.2 kg)    Waist Circumference  47.5 inches    Hip Circumference  46 inches    Waist to Hip Ratio  1.03 %    BMI (Calculated)  34.27    Triceps Skinfold  12 mm    % Body Fat  33 %    Grip Strength  39 kg    Flexibility  0 in    Single Leg Stand  4 seconds       Nutrition: Nutrition Therapy & Goals - 05/06/18 1504      Nutrition Therapy   Diet  Heart Healthy      Personal Nutrition Goals   Nutrition Goal  pt to identify and limit food sources of sodium, saturated fat, trans fat      Intervention Plan   Intervention  Prescribe, educate and counsel regarding individualized specific dietary modifications aiming towards targeted core components such as weight, hypertension, lipid management, diabetes, heart failure and other comorbidities.    Expected Outcomes  Short Term Goal: Understand basic principles of dietary content, such as calories, fat, sodium, cholesterol and nutrients.;Long Term Goal: Adherence to prescribed nutrition plan.       Nutrition Discharge: Nutrition Assessments - 05/06/18 1500       MEDFICTS Scores   Pre Score  42    Post Score  27    Score Difference  -15       Education Questionnaire Score: Knowledge Questionnaire Score - 05/02/18 1355      Knowledge Questionnaire Score   Pre Score  18/24    Post Score  20/24       Goals reviewed with patient; copy given to patient. Tom graduated from cardiac rehab program today with completion of 36 exercise sessions in Phase II. Pt maintained good attendance and progressed nicely during his participation in rehab as evidenced by increased MET level.   Medication list reconciled. Repeat  PHQ score-  0.  Pt has made significant lifestyle changes and should be commended for his success. Pt feels he has achieved his goals during cardiac rehab. Tom did not complete his post exercise walk test due to complaints of chronic back pain.Tom lost 4 pounds during his participation in the program.  Pt plans to continue exercise at the apartment complex at his apartment in [place].Barnet Pall, RN,BSN 05/14/2018 1:49 PM

## 2018-05-05 ENCOUNTER — Encounter (HOSPITAL_COMMUNITY): Payer: Medicare Other

## 2018-05-07 ENCOUNTER — Encounter (HOSPITAL_COMMUNITY): Payer: Medicare Other

## 2018-05-09 ENCOUNTER — Encounter (HOSPITAL_COMMUNITY): Payer: Medicare Other

## 2018-05-12 ENCOUNTER — Encounter (HOSPITAL_COMMUNITY): Payer: Medicare Other

## 2018-05-14 ENCOUNTER — Encounter (HOSPITAL_COMMUNITY): Payer: Medicare Other

## 2018-05-16 ENCOUNTER — Encounter (HOSPITAL_COMMUNITY): Payer: Medicare Other

## 2018-05-16 DIAGNOSIS — E538 Deficiency of other specified B group vitamins: Secondary | ICD-10-CM | POA: Diagnosis not present

## 2018-05-20 ENCOUNTER — Other Ambulatory Visit: Payer: Self-pay | Admitting: Cardiology

## 2018-05-26 ENCOUNTER — Ambulatory Visit: Payer: Medicare Other | Admitting: Cardiology

## 2018-05-29 ENCOUNTER — Ambulatory Visit (INDEPENDENT_AMBULATORY_CARE_PROVIDER_SITE_OTHER): Payer: Medicare Other | Admitting: Cardiology

## 2018-05-29 ENCOUNTER — Encounter: Payer: Self-pay | Admitting: Cardiology

## 2018-05-29 VITALS — BP 132/80 | HR 65 | Ht 68.0 in | Wt 227.6 lb

## 2018-05-29 DIAGNOSIS — Z9861 Coronary angioplasty status: Secondary | ICD-10-CM

## 2018-05-29 DIAGNOSIS — E785 Hyperlipidemia, unspecified: Secondary | ICD-10-CM | POA: Diagnosis not present

## 2018-05-29 DIAGNOSIS — I2510 Atherosclerotic heart disease of native coronary artery without angina pectoris: Secondary | ICD-10-CM

## 2018-05-29 DIAGNOSIS — Z01818 Encounter for other preprocedural examination: Secondary | ICD-10-CM

## 2018-05-29 DIAGNOSIS — I10 Essential (primary) hypertension: Secondary | ICD-10-CM | POA: Diagnosis not present

## 2018-05-29 NOTE — Progress Notes (Signed)
PCP: Leeroy Cha, MD  [place].  Clinic Note: Chief Complaint  Patient presents with  . Follow-up    No complaint  . Coronary Artery Disease    No angina  . Pre-op Exam    HPI: Noah Scott is a 82 y.o. male with a PMH notable for recent NSTEMI & CAD-PCI who presents today for 3 month f/u.  H/o CAD dating back to 2001 -- [place] while living on [place].   He subsequently was seen once by Dr. Marijo File in [facility] 06/17/2017 but he did not established there.  NSTEMI 11/21/2017 (He noted that his "ANGINA" equivalent is "severe heartburn radiating to his throat") --> Cath-PCI m-dRCA (DES x 1) & Ost-Prox RCA (2 overlapping DES) -- Difficult, complicated PCI of 3 separate lesions in the RCA requiring use of extra-support buddy wire.; Mod-Severe ISR in OM1 (stent from 2001 - med management pending Back Surgery).  He has been having significant back pain with radiculopathy & intermittent ~"psedoclaudication" Sx --> Seen by Dr. Sherley Bounds - who has asked about timing for possible spinal procedure in relation to antiplatelet agents.  Planned Surgery is end of Sept.  Leanard Dimaio was last seen on 03/12/18 --> is doing well from a cardiac standpoint.  No further angina.  Was enjoying cardiac rehab indicating that actually helped his spinal stenosis symptoms, but he was limited in what he can do and is now having progressively worsening symptoms.  We discussed trying to hold off at least 6 months post PCI.  Recent Hospitalizations:   None  Studies Personally Reviewed - (if available, images/films reviewed: From Epic Chart or Care Everywhere)  None  Interval History: Noah Scott returns today still happily doing well from a cardiology standpoint.  He is not having any further anginal symptoms with either rest or exertion.  He just is not able to do much at all because of his back pain.  They are planning surgery on September 27 and he is hoping that after  that he will be L to get back and exercise.  He may be interested in restarting the [place] maintenance plan. From a cardiac standpoint no active symptoms besides some bruising.  No chest tightness or pressure with rest or exertion.  No resting or exertional dyspnea.  No PND, orthopnea or edema.  No rapid irregular heartbeats or palpitations.  No syncope/near syncope or TIA/amaurosis fugax.  No bleeding issues besides bruising on aspirin and Brilinta.  No melena, hematochezia, hematuria, epistaxis.  No claudication.  More pseudoclaudication symptoms.   ROS: A comprehensive was performed. Review of Systems  Constitutional: Positive for weight loss. Negative for malaise/fatigue (just limited by leg pain).  Respiratory: Negative for cough and shortness of breath.   Musculoskeletal: Positive for back pain. Negative for myalgias.  Neurological: Positive for weakness (legs weak due ot neuropathy).       Neuropathic pain in both legs  All other systems reviewed and are negative.   I have reviewed and (if needed) personally updated the patient's problem list, medications, allergies, past medical and surgical history, social and family history.   Past Medical History:  Diagnosis Date  . CAD S/P percutaneous coronary angioplasty 2001   a) 2001 ([place]): PCI OM1 - now with ~65% ISR.;; b) NSTEMI 11/2017 - ost-prox RCA 75-65% (Overlapping ONYX DES 3.0 x 26 & 3.0 x 8 --> 3.5-3.3 mm). m-dRCA 95% (ONYX DES 2.75 x 18 mm -> 3.0 mm). Ost OM1 65% ISR (med Rx).  dRCA-OstRDA (50%-25%)  . Essential (primary) hypertension 11/21/2017  . Hyperlipidemia 11/21/2017  . Neurogenic claudication due to lumbar spinal stenosis 02/06/2018  . NSTEMI (non-ST elevated myocardial infarction) ([place]) 11/21/2017   Multiple RCA lesions - 2 Overlapping DES Ost-Prox RCA & 1 in mid-distal RCA    Past Surgical History:  Procedure Laterality Date  . CORONARY STENT INTERVENTION N/A 11/25/2017   Procedure: CORONARY STENT INTERVENTION;   Surgeon: Leonie Man, MD;  Location: [facility];  Service: Cardiovascular:   m-dRCA 95%  (DES PCI - 0%.  Resolute ONYX DES 2.75 x 18 mm - ~3.0 mm). Ost-proxRCA 75-65% (DES PCI-0%. 2 overlapping Resolute ONYX DES 3.0 x 26 & 3.0 x 8 mm --> 3.5-3.3 mm)  . CORONARY STENT INTERVENTION  2001   ([place]): PCI OM1  . LEA DOPPLERS  12/12/2017   Normal bilateral ABIs and TBI's.  Marland Kitchen LEFT HEART CATH AND CORONARY ANGIOGRAPHY N/A 11/25/2017   Procedure: LEFT HEART CATH AND CORONARY ANGIOGRAPHY;  Surgeon: Leonie Man, MD;  Location: [facility];  Service: Cardiovascular:  Ost-proxRCA 75&65%(DES PCI), m-dRCA 95% (DES PCI), dRCA 50%-25% ost rPDA. ostOM1 65% ISR (from 2001).   . TRANSTHORACIC ECHOCARDIOGRAM  11/23/2017   Normal LV size and function.  EF 60 to 65%.  No RWMA.  GR 1 DD.  Mild RV dilation.    Current Meds  Medication Sig  . Aromatic Inhalants (VICKS VAPOR INHALER IN) Inhale 1 Inhaler into the lungs as needed (stuffy nose).  Marland Kitchen aspirin EC 81 MG tablet Take 81 mg by mouth daily.  Marland Kitchen BRILINTA 90 MG TABS tablet TAKE 1 TABLET(90 MG) BY MOUTH TWICE DAILY  . doxazosin (CARDURA) 8 MG tablet Take 8 mg by mouth daily.  Marland Kitchen ezetimibe (ZETIA) 10 MG tablet Take 1 tablet (10 mg total) by mouth daily.  . furosemide (LASIX) 20 MG tablet Take 1 tablet (20 mg total) by mouth daily.  Marland Kitchen losartan (COZAAR) 50 MG tablet Take 1 tablet (50 mg total) by mouth daily.  . metoprolol tartrate (LOPRESSOR) 25 MG tablet Take 0.5 tablets (12.5 mg total) by mouth 2 (two) times daily.  . nitroGLYCERIN (NITROSTAT) 0.4 MG SL tablet Place 1 tablet (0.4 mg total) under the tongue every 5 (five) minutes as needed for chest pain.  . pantoprazole (PROTONIX) 40 MG tablet Take 1 tablet (40 mg total) by mouth daily.  . potassium chloride SA (K-DUR,KLOR-CON) 20 MEQ tablet Take 1 tablet (20 mEq total) by mouth daily.  . rosuvastatin (CRESTOR) 40 MG tablet Take 1 tablet (40 mg total) by mouth daily.    Allergies    Allergen Reactions  . Enalapril   . Plavix [Clopidogrel Bisulfate]     Social History   Tobacco Use  . Smoking status: Never Smoker  . Smokeless tobacco: Never Used  Substance Use Topics  . Alcohol use: No  . Drug use: No   Social History   Social History Narrative   Lives alone in an apartment on the first floor.  Has one daughter.  Retired Geophysical data processor.  Education: some college.     family history includes Heart disease in his father. Not sure of details.  Wt Readings from Last 3 Encounters:  05/29/18 227 lb 9.6 oz (103.2 kg)  04/30/18 225 lb 5 oz (102.2 kg)  03/17/18 224 lb 13.9 oz (102 kg)    PHYSICAL EXAM BP 132/80   Pulse 65   Ht 5\' 8"  (1.727 m)   Wt 227 lb  9.6 oz (103.2 kg)   BMI 34.61 kg/m  Physical Exam  Constitutional: He is oriented to person, place, and time. He appears well-developed and well-nourished. No distress.  Healthy-appearing.  Well-groomed  HENT:  Head: Normocephalic and atraumatic.  Neck: Normal range of motion. No hepatojugular reflux and no JVD present. Carotid bruit is not present. No thyromegaly present.  Cardiovascular: Normal rate, regular rhythm, normal heart sounds and intact distal pulses.  No extrasystoles are present. PMI is not displaced. Exam reveals no gallop and no friction rub.  No murmur heard. Pulmonary/Chest: Effort normal and breath sounds normal. No respiratory distress. He has no wheezes. He has no rales.  Abdominal: Soft. Bowel sounds are normal. He exhibits no distension. There is no tenderness. There is no rebound.  No HSM  Musculoskeletal: He exhibits no edema.  stiff, slow gait.  Limited by pain  Neurological: He is alert and oriented to person, place, and time.  Psychiatric: He has a normal mood and affect. His behavior is normal. Judgment and thought content normal.  Vitals reviewed.   Adult ECG Report none  Other studies Reviewed: Additional studies/ records that were reviewed today include:   Recent Labs: No new labs since Jan 17, 2018  TC 87, TG 97, HDL 35, LDL 33.  BUN/Cr 20/1.2.  TSH 2.5.  A1c 6.2, glucose 109.   ASSESSMENT / PLAN: Problem List Items Addressed This Visit    CAD S/P percutaneous coronary angioplasty - Primary (Chronic)    No further angina following his PCI after his non-STEMI.  He does have the existing in-stent restenosis in the circumflex and then recent stents in the RCA. Currently on aspirin and Brilinta as well as statin/Zetia and beta-blocker.  While not optimal, his spinal stenosis symptoms are quite limiting, and we are now 6 months post PCI.  Most recent data on new generation DES stents would suggest that we should be fine halting antiplatelet agent for his back surgery.  Plan: Continue current medications with the following recommendations preop.  Stop aspirin and Brilinta 7 days preop  On postop day 1-2 (when deemed safe by Dr. Ronnald Ramp) -start Plavix (first dose should be 600 mg -- 4x 75 mg tabs), then 75 mg daily.  Do not restart aspirin.  For now we will continue to treat his circumflex-OM in-stent restenosis medically unless symptoms warrant.      Relevant Orders   EKG 12-Lead (Completed)   Essential (primary) hypertension (Chronic)    Blood pressure looks great on current meds.  Continue losartan and metoprolol tartrate.      Relevant Orders   EKG 12-Lead (Completed)   Hyperlipidemia with target LDL less than 70 (Chronic)    Well-controlled previously on Crestor.  He continues on the 40 mg.  Can reassess over the wintertime and potentially reduce dose to 20 mg.      Preoperative clearance    He seems to be stable from a cardiac standpoint with no recurrent anginal symptoms.  Very limited by his spinal stenosis symptoms, and I think his surgery is somewhat urgent in nature.  With no active angina or heart failure symptoms following PCI, no history of stroke diabetes or renal insufficiency, he would be a low risk patient for from a  cardiac standpoint a low risk procedure.  At this time I would not do any further cardiac evaluation.  As discussed in CAD section, okay to hold aspirin plus Brilinta 7 days preop and when deemed safe postop, we will convert from  Brilinta to Plavix as instructed.      Relevant Orders   EKG 12-Lead (Completed)      I spent a total of 25 minutes with the patient and chart review. >  50% of the time was spent in direct patient consultation.   Current medicines are reviewed at length with the patient today.  (+/- concerns) Pre-op questions The following changes have been made:  see below  Patient Instructions  MEDICATION INSTRUCTIONS  OKAY FOR SURGERY FROM A CARDIAC STANDPOINT    MAY HOLD BRILINTA  AND ASPIRIN 7 DAYS PRIOR TO SURGERY   AFTER SURGERY YOU MAY SWITCH TO PLAVIX FROM  BRILINTA - THE FIRST DOSE TAKE 4 TABLETS THAT DAY THEN FOLLOW WITH ONE TABLET 75 MG DAILY     IF YOU LIKE TO  CONTINUE/START  WITH CARDIAC REHAB MAINTENANCE , YOU MAY.   Your physician wants you to follow-up in [place].You will receive a reminder letter in the mail two months in advance. If you don't receive a letter, please call our office to schedule the follow-up appointment.  If you need a refill on your cardiac medications before your next appointment, please call your pharmacy.   Studies Ordered:   Orders Placed This Encounter  Procedures  . EKG 12-Lead      Glenetta Hew, M.D., M.S. Interventional Cardiologist   Pager # [phone] Phone # [phone] [address] [place], [place] [address]   Thank you for choosing [facility] at [facility]!!

## 2018-05-29 NOTE — Patient Instructions (Addendum)
MEDICATION INSTRUCTIONS  OKAY FOR SURGERY FROM A CARDIAC STANDPOINT    MAY HOLD BRILINTA  AND ASPIRIN 7 DAYS PRIOR TO SURGERY   AFTER SURGERY YOU MAY SWITCH TO PLAVIX FROM  BRILINTA - THE FIRST DOSE TAKE 4 TABLETS THAT DAY THEN FOLLOW WITH ONE TABLET 75 MG DAILY     IF YOU LIKE TO  CONTINUE/START  WITH CARDIAC REHAB MAINTENANCE , YOU MAY.   Your physician wants you to follow-up in [place].You will receive a reminder letter in the mail two months in advance. If you don't receive a letter, please call our office to schedule the follow-up appointment.  If you need a refill on your cardiac medications before your next appointment, please call your pharmacy.

## 2018-06-01 ENCOUNTER — Encounter: Payer: Self-pay | Admitting: Cardiology

## 2018-06-01 NOTE — Assessment & Plan Note (Signed)
Blood pressure looks great on current meds.  Continue losartan and metoprolol tartrate.

## 2018-06-01 NOTE — Assessment & Plan Note (Signed)
No further angina following his PCI after his non-STEMI.  He does have the existing in-stent restenosis in the circumflex and then recent stents in the RCA. Currently on aspirin and Brilinta as well as statin/Zetia and beta-blocker.  While not optimal, his spinal stenosis symptoms are quite limiting, and we are now 6 months post PCI.  Most recent data on new generation DES stents would suggest that we should be fine halting antiplatelet agent for his back surgery.  Plan: Continue current medications with the following recommendations preop.  Stop aspirin and Brilinta 7 days preop  On postop day 1-2 (when deemed safe by Dr. Ronnald Ramp) -start Plavix (first dose should be 600 mg -- 4x 75 mg tabs), then 75 mg daily.  Do not restart aspirin.  For now we will continue to treat his circumflex-OM in-stent restenosis medically unless symptoms warrant.

## 2018-06-01 NOTE — Assessment & Plan Note (Signed)
He seems to be stable from a cardiac standpoint with no recurrent anginal symptoms.  Very limited by his spinal stenosis symptoms, and I think his surgery is somewhat urgent in nature.  With no active angina or heart failure symptoms following PCI, no history of stroke diabetes or renal insufficiency, he would be a low risk patient for from a cardiac standpoint a low risk procedure.  At this time I would not do any further cardiac evaluation.  As discussed in CAD section, okay to hold aspirin plus Brilinta 7 days preop and when deemed safe postop, we will convert from Brilinta to Plavix as instructed.

## 2018-06-01 NOTE — Assessment & Plan Note (Signed)
Well-controlled previously on Crestor.  He continues on the 40 mg.  Can reassess over the wintertime and potentially reduce dose to 20 mg.

## 2018-06-03 NOTE — Pre-Procedure Instructions (Signed)
Noah Scott  06/03/2018      [id number] - [place], [place] - [address] AT [place] OF [address] [place] [address] Phone: [phone] Fax: [phone]    Your procedure is scheduled on June 13, 2018.  Report to [facility] Admitting at 530 AM.  Call this number if you have problems the morning of surgery:  [phone]   Remember:  Do not eat or drink after midnight.    Take these medicines the morning of surgery with A SIP OF WATER  Metoprolol (lopressor) Doxazosin (cardura) Nitrostat-if needed for chest pain Pantoprazole (protonix)  Follow your surgeon's instructions on when to hold/resume aspirin/Brilinta-7 days prior to surgery per Dr. Ellyn Hack. Last dose 06/05/18  7 days prior to surgery STOP taking any Aleve, Naproxen, Ibuprofen, Motrin, Advil, Goody's, BC's, all herbal medications, fish oil, and all vitamins   [place]- Preparing For Surgery  Before surgery, you can play an important role. Because skin is not sterile, your skin needs to be as free of germs as possible. You can reduce the number of germs on your skin by washing with CHG (chlorahexidine gluconate) Soap before surgery.  CHG is an antiseptic cleaner which kills germs and bonds with the skin to continue killing germs even after washing.    Oral Hygiene is also important to reduce your risk of infection.  Remember - BRUSH YOUR TEETH THE MORNING OF SURGERY WITH YOUR REGULAR TOOTHPASTE  Please do not use if you have an allergy to CHG or antibacterial soaps. If your skin becomes reddened/irritated stop using the CHG.  Do not shave (including legs and underarms) for at least 48 hours prior to first CHG shower. It is OK to shave your face.  Please follow these instructions carefully.   1. Shower the NIGHT BEFORE SURGERY and the MORNING OF SURGERY with CHG.   2. If you chose to wash your hair, wash your hair first as usual with your  normal shampoo.  3. After you shampoo, rinse your hair and body thoroughly to remove the shampoo.  4. Use CHG as you would any other liquid soap. You can apply CHG directly to the skin and wash gently with a scrungie or a clean washcloth.   5. Apply the CHG Soap to your body ONLY FROM THE NECK DOWN.  Do not use on open wounds or open sores. Avoid contact with your eyes, ears, mouth and genitals (private parts). Wash Face and genitals (private parts)  with your normal soap.  6. Wash thoroughly, paying special attention to the area where your surgery will be performed.  7. Thoroughly rinse your body with warm water from the neck down.  8. DO NOT shower/wash with your normal soap after using and rinsing off the CHG Soap.  9. Pat yourself dry with a CLEAN TOWEL.  10. Wear CLEAN PAJAMAS to bed the night before surgery, wear comfortable clothes the morning of surgery  11. Place CLEAN SHEETS on your bed the night of your first shower and DO NOT SLEEP WITH PETS.  Day of Surgery:  Do not apply any deodorants/lotions.  Please wear clean clothes to the hospital/surgery center.   Remember to brush your teeth WITH YOUR REGULAR TOOTHPASTE.   Do not wear jewelry  Do not wear lotions, powders, or colognes, or deodorant.             Men may shave face and neck.  Do not bring valuables to the hospital.   [facility] is not responsible for any belongings or valuables.  Contacts, dentures or bridgework may not be worn into surgery.  Leave your suitcase in the car.  After surgery it may be brought to your room.  For patients admitted to the hospital, discharge time will be determined by your treatment team.  Patients discharged the day of surgery will not be allowed to drive home.  Please read over the following fact sheets that you were given.

## 2018-06-03 NOTE — Progress Notes (Addendum)
PCP: Leeroy Cha, MD  Cardiologist: Glenetta Hew, MD  EKG: 05/29/18 in EPIC  Stress test: 11/23/17 in EPIC  ECHO: 11/23/17 in EPIC  Cardiac Cath: 11/25/17 in EPIC  Chest x-ray: 11/21/17 in Epic  Aspirin and Brilinta will be held beginning 06/06/18 per MD instructions

## 2018-06-04 ENCOUNTER — Encounter (HOSPITAL_COMMUNITY)
Admission: RE | Admit: 2018-06-04 | Discharge: 2018-06-04 | Disposition: A | Discharge status: Home / Self Care | Payer: Medicare Other | Source: Ambulatory Visit | Attending: Neurological Surgery | Admitting: Neurological Surgery

## 2018-06-04 ENCOUNTER — Other Ambulatory Visit: Payer: Self-pay

## 2018-06-04 ENCOUNTER — Encounter (HOSPITAL_COMMUNITY): Payer: Self-pay

## 2018-06-04 DIAGNOSIS — M48061 Spinal stenosis, lumbar region without neurogenic claudication: Secondary | ICD-10-CM | POA: Diagnosis not present

## 2018-06-04 DIAGNOSIS — Z01818 Encounter for other preprocedural examination: Secondary | ICD-10-CM | POA: Diagnosis not present

## 2018-06-04 LAB — SURGICAL PCR SCREEN
MRSA, PCR: NEGATIVE
Staphylococcus aureus: NEGATIVE

## 2018-06-04 LAB — CBC WITH DIFFERENTIAL/PLATELET
ABS IMMATURE GRANULOCYTES: 0 10*3/uL (ref 0.0–0.1)
BASOS ABS: 0 10*3/uL (ref 0.0–0.1)
Basophils Relative: 1 %
EOS PCT: 3 %
Eosinophils Absolute: 0.2 10*3/uL (ref 0.0–0.7)
HCT: 42.2 % (ref 39.0–52.0)
HEMOGLOBIN: 14.1 g/dL (ref 13.0–17.0)
Immature Granulocytes: 0 %
LYMPHS PCT: 24 %
Lymphs Abs: 1.3 10*3/uL (ref 0.7–4.0)
MCH: 31.2 pg (ref 26.0–34.0)
MCHC: 33.4 g/dL (ref 30.0–36.0)
MCV: 93.4 fL (ref 78.0–100.0)
MONO ABS: 0.7 10*3/uL (ref 0.1–1.0)
MONOS PCT: 13 %
Neutro Abs: 3 10*3/uL (ref 1.7–7.7)
Neutrophils Relative %: 59 %
Platelets: 172 10*3/uL (ref 150–400)
RBC: 4.52 MIL/uL (ref 4.22–5.81)
RDW: 12.9 % (ref 11.5–15.5)
WBC: 5.2 10*3/uL (ref 4.0–10.5)

## 2018-06-04 LAB — BASIC METABOLIC PANEL
Anion gap: 8 (ref 5–15)
BUN: 14 mg/dL (ref 8–23)
CO2: 27 mmol/L (ref 22–32)
Calcium: 9.1 mg/dL (ref 8.9–10.3)
Chloride: 106 mmol/L (ref 98–111)
Creatinine, Ser: 1.09 mg/dL (ref 0.61–1.24)
GFR calc Af Amer: >60 mL/min (ref >60)
GLUCOSE: 113 mg/dL — AB (ref 70–99)
Potassium: 3.8 mmol/L (ref 3.5–5.1)
Sodium: 141 mmol/L (ref 135–145)

## 2018-06-04 LAB — PROTIME-INR
INR: 1.06
PROTHROMBIN TIME: 13.7 s (ref 11.4–15.2)

## 2018-06-05 ENCOUNTER — Other Ambulatory Visit: Payer: Self-pay | Admitting: Cardiology

## 2018-06-05 NOTE — Progress Notes (Signed)
Anesthesia Chart Review:  Case:  [id number] Date/Time:  06/13/18 0715   Procedure:  Laminectomy and Foraminotomy - L1-L2 - L2-L3 - L3-L4 - bilateral (Bilateral Back)   Anesthesia type:  General   Pre-op diagnosis:  Stenosis   Location:  [facility] OR ROOM 19 / [place] [facility]   Surgeon:  Eustace Moore, MD      DISCUSSION: Patient is a 82 year old male scheduled for the above procedure.   History includes never smoker, CAD (s/p stent OM1 '01, NSTEMI 11/21/17 s/p DES mid-distal RCA, overlapping ostial-proximal RCA, DES proximal RCA, 65% in-stent stenosis ostial OM1 11/25/17), HLD, HTN. BMI is consistent with obesity.   He is s/p multiple RCA drug-eluting stents just over six months ago. Cardiologist Dr. Ellyn Hack saw patient in follow-up on 05/29/18 and wrote in regards to preoperative clearance: "He seems to be stable from a cardiac standpoint with no recurrent anginal symptoms.  Very limited by his spinal stenosis symptoms, and I think his surgery is somewhat urgent in nature. With no active angina or heart failure symptoms following PCI, no history of stroke diabetes or renal insufficiency, he would be a low risk patient for from a cardiac standpoint a low risk procedure. At this time I would not do any further cardiac evaluation. As discussed in CAD section, okay to hold aspirin plus Brilinta 7 days preop and when deemed safe postop, we will convert from Brilinta to Plavix as instructed."  ASA and Brilinta on hold starting 06/06/18. Per Dr. Kalob [place] plan: Plan: Continue current medications with the following recommendations preop.  Stop aspirin and Brilinta 7 days preop  On postop day 1-2 (when deemed safe by Dr. Ronnald Ramp) -start Plavix (first dose should be 600 mg -- 4x 75 mg tabs), then 75 mg daily.  Do not restart aspirin.  For now we will continue to treat his circumflex-OM in-stent restenosis medically unless symptoms warrant.   If no acute changes then I anticipate that he can proceed as planned.   VS:  BP 133/72   Pulse 66   Temp 36.7 C (Oral)   Resp 18   Ht 5\' 8"  (1.727 m)   Wt 103.4 kg   SpO2 99%   BMI 34.65 kg/m   PROVIDERS: Leeroy Cha, MD is PCP Glenetta Hew, MD is cardiologist   LABS: Labs reviewed: Acceptable for surgery. (all labs ordered are listed, but only abnormal results are displayed)  Labs Reviewed  BASIC METABOLIC PANEL - Abnormal; Notable for the following components:      Result Value   Glucose, Bld 113 (*)    All other components within normal limits  SURGICAL PCR SCREEN  CBC WITH DIFFERENTIAL/PLATELET  PROTIME-INR    IMAGES: MRI L-spine 02/20/18: IMPRESSION: 1. Multilevel spondylosis of the lumbar spine as described. 2. Mild right foraminal narrowing at T12-L1. 3. Mild subarticular and foraminal narrowing bilaterally at L1-2. 4. Moderate left and mild right subarticular narrowing at L2-3 with moderate foraminal stenosis bilaterally, worse on the left. 5. Mild subarticular and moderate bilateral foraminal stenosis at L3-4. 6. Moderate right and mild left subarticular narrowing with moderate foraminal narrowing bilaterally at L4-5. 7. Moderate right and mild left subarticular narrowing with mild foraminal narrowing bilaterally at L5-S1.  CXR 11/21/17: IMPRESSION: No active cardiopulmonary disease.   EKG: 05/29/18: SR with first degree AV block. LAD.   CV: Cardiac cath 11/25/17 (NSTEMI, large inferior defect 11/22/17 stress test):  Culprit Lesion #1: Mid RCA to Dist RCA lesion is 95% stenosed.  A drug-eluting  stent was successfully placed using a [place] [id number]. Postdilated to almost 3.0 mm  Post intervention, there is a 0% residual stenosis.  Lesion set #2: Ost RCA to Prox RCA lesion is 75% stenosed. Prox RCA lesion is 65% stenosed.  2 overlapping Drug-Eluting Stents were successfully placed using a STENT RESOLUTE ONYX 3.0X26 and a STENT RESOLUTE ONYX 3.0X26. Overlapping proximally (postdilated to 3.5 mill meters  proximal down to 3.3 mm distal.  A drug-eluting stent was successfully placed using a  Ost 1st Mrg to 1st Mrg lesion is 65% stenosed. This is within a previously placed stent from 5009  LV end diastolic pressure is normal.  Dist RCA lesion is 50% stenosed with 25% stenosed side branch in Ost RPDA. - Severe 1-2 vessel disease with ostial-proximal RCA as well as mid RCA severe lesions as well as moderate to severe in-stent restenosis in OM1. Difficult, complicated PCI of 3 separate lesions in the RCA requiring use of extra-support buddy wire. - Plan medical management of the OM lesion, if necessary could bring back for staged PCI if symptoms not fully controlled.  The patient will be transferred to [facility] post procedure unit for ongoing care and pneumatic band removal.  He is on aspirin plus Brilinta, would continue aspirin for at least 3-6 months, and continue Brilinta for minimum 1 year but consider prolonged Thienopyridine coverage.  Continue aggressive risk factor modification.   Echo 11/23/17: Study Conclusions - Left ventricle: The cavity size was normal. Wall thickness was   normal. Systolic function was normal. The estimated ejection   fraction was in the range of 60% to 65%. Wall motion was normal;   there were no regional wall motion abnormalities. Doppler   parameters are consistent with abnormal left ventricular   relaxation (grade 1 diastolic dysfunction). - Right atrium: The atrium was mildly dilated.   Past Medical History:  Diagnosis Date  . CAD S/P percutaneous coronary angioplasty 2001   a) 2001 ([place]): PCI OM1 - now with ~65% ISR.;; b) NSTEMI 11/2017 - ost-prox RCA 75-65% (Overlapping ONYX DES 3.0 x 26 & 3.0 x 8 --> 3.5-3.3 mm). m-dRCA 95% (ONYX DES 2.75 x 18 mm -> 3.0 mm). Ost OM1 65% ISR (med Rx). dRCA-OstRDA (50%-25%)  . Essential (primary) hypertension 11/21/2017  . Hyperlipidemia 11/21/2017  . Neurogenic claudication due to lumbar spinal stenosis  02/06/2018  . NSTEMI (non-ST elevated myocardial infarction) ([place]) 11/21/2017   Multiple RCA lesions - 2 Overlapping DES Ost-Prox RCA & 1 in mid-distal RCA    Past Surgical History:  Procedure Laterality Date  . CORONARY STENT INTERVENTION N/A 11/25/2017   Procedure: CORONARY STENT INTERVENTION;  Surgeon: Leonie Man, MD;  Location: [facility];  Service: Cardiovascular:   m-dRCA 95%  (DES PCI - 0%.  Resolute ONYX DES 2.75 x 18 mm - ~3.0 mm). Ost-proxRCA 75-65% (DES PCI-0%. 2 overlapping Resolute ONYX DES 3.0 x 26 & 3.0 x 8 mm --> 3.5-3.3 mm)  . CORONARY STENT INTERVENTION  2001   ([place]): PCI OM1  . LEA DOPPLERS  12/12/2017   Normal bilateral ABIs and TBI's.  Marland Kitchen LEFT HEART CATH AND CORONARY ANGIOGRAPHY N/A 11/25/2017   Procedure: LEFT HEART CATH AND CORONARY ANGIOGRAPHY;  Surgeon: Leonie Man, MD;  Location: [facility];  Service: Cardiovascular:  Ost-proxRCA 75&65%(DES PCI), m-dRCA 95% (DES PCI), dRCA 50%-25% ost rPDA. ostOM1 65% ISR (from 2001).   . TRANSTHORACIC ECHOCARDIOGRAM  11/23/2017   Normal LV size and  function.  EF 60 to 65%.  No RWMA.  GR 1 DD.  Mild RV dilation.    MEDICATIONS: . Aromatic Inhalants (VICKS VAPOR INHALER IN)  . aspirin EC 81 MG tablet  . BRILINTA 90 MG TABS tablet  . cyanocobalamin (,VITAMIN B-12,) 1000 MCG/ML injection  . doxazosin (CARDURA) 8 MG tablet  . ezetimibe (ZETIA) 10 MG tablet  . furosemide (LASIX) 20 MG tablet  . losartan (COZAAR) 50 MG tablet  . metoprolol tartrate (LOPRESSOR) 25 MG tablet  . nitroGLYCERIN (NITROSTAT) 0.4 MG SL tablet  . pantoprazole (PROTONIX) 40 MG tablet  . potassium chloride SA (K-DUR,KLOR-CON) 20 MEQ tablet  . rosuvastatin (CRESTOR) 40 MG tablet   No current facility-administered medications for this encounter.     George Hugh [facility] Stay [facility]/Anesthesiology Phone [phone] 06/05/2018 9:41 AM

## 2018-06-09 ENCOUNTER — Telehealth: Payer: Self-pay | Admitting: Cardiology

## 2018-06-09 MED ORDER — CLOPIDOGREL BISULFATE 75 MG PO TABS: 75.0000 mg | ORAL_TABLET | Freq: Every day | ORAL | 3 refills | Status: DC

## 2018-06-09 NOTE — Telephone Encounter (Signed)
New Message:      Pt c/o medication issue:  1. Name of Medication: BRILINTA 90 MG TABS tablet  2. How are you currently taking this medication (dosage and times per day)?   3. Are you having a reaction (difficulty breathing--STAT)?   4. What is your medication issue? Pt states at his last visit is was discussed that after his procedure he would no longer take this medication and a new medication was supposed to be sent to [facility] for him. He states the pharmacy hasn't received anything as of yet.

## 2018-06-09 NOTE — Telephone Encounter (Signed)
Spoke with pt. Per his 05/29/18 o/v with Dr.Harding. The pt is to    [facility] 7 DAYS PRIOR TO SURGERY   AFTER SURGERY YOU MAY SWITCH TO PLAVIX FROM  BRILINTA - THE FIRST DOSE TAKE 4 TABLETS THAT DAY THEN FOLLOW WITH ONE TABLET 75 MG DAILY   An Rx sent to pt's pharmacy for Plavix #90 R-3  Pt verbalized understanding to instruction given. No further questions at this time.

## 2018-06-12 NOTE — Anesthesia Preprocedure Evaluation (Addendum)
Anesthesia Evaluation  Patient identified by MRN, date of birth, ID band Patient awake    Reviewed: Allergy & Precautions, NPO status , Patient's Chart, lab work & pertinent test results, reviewed documented beta blocker date and time   Airway Mallampati: I  TM Distance: >3 FB Neck ROM: Full    Dental no notable dental hx. (+) Missing, Dental Advisory Given,    Pulmonary    Pulmonary exam normal breath sounds clear to auscultation       Cardiovascular hypertension, Pt. on home beta blockers and Pt. on medications + CAD, + Past MI and + Cardiac Stents (2001, 11/25/2017 )  Normal cardiovascular exam Rhythm:Regular Rate:Normal  EKG: 05/29/18: SR with first degree AV block. LAD.  Cardiac cath 11/25/17 (NSTEMI, large inferior defect 11/22/17 stress test): Culprit Lesion #1: Mid RCA to Dist RCA lesion is 95% stenosed. A drug-eluting stent was successfully placed using a [place] [id number]. Postdilated to almost 3.0 mm Post intervention, there is a 0% residual stenosis. Lesion set #2: Ost RCA to Prox RCA lesion is 75% stenosed. Prox RCA lesion is 65% stenosed. 2 overlapping Drug-Eluting Stents were successfully placed using a STENT RESOLUTE ONYX 3.0X26 and a STENT RESOLUTE ONYX 3.0X26. Overlapping proximally (postdilated to 3.5 mill meters proximal down to 3.3 mm distal. A drug-eluting stent was successfully placed using a Ost 1st Mrg to 1st Mrg lesion is 65% stenosed. This is within a previously placed stent from 6629 LV end diastolic pressure is normal. Dist RCA lesion is 50% stenosed with 25% stenosed side branch in Ost RPDA. - Severe 1-2 vessel disease with ostial-proximal RCA as well as mid RCA severe lesions as well as moderate to severe in-stent restenosis in OM1. Difficult, complicated PCI of 3 separate lesions in the RCA requiring use of extra-support buddy wire.  Echo 11/23/17: EF 60-65%, no valvular  abnormalities    Neuro/Psych negative neurological ROS  negative psych ROS   GI/Hepatic negative GI ROS, Neg liver ROS,   Endo/Other  negative endocrine ROS  Renal/GU negative Renal ROS  negative genitourinary   Musculoskeletal  (+) Arthritis , Osteoarthritis,    Abdominal   Peds  Hematology negative hematology ROS (+)   Anesthesia Other Findings ASA and Brilinta on hold starting 06/06/18  Cardiac clearance by Dr. Ellyn Hack  Reproductive/Obstetrics                            Anesthesia Physical Anesthesia Plan  ASA: III  Anesthesia Plan: General   Post-op Pain Management:    Induction: Intravenous  PONV Risk Score and Plan: 2 and Dexamethasone, Ondansetron and Treatment may vary due to age or medical condition  Airway Management Planned: Oral ETT  Additional Equipment:   Intra-op Plan:   Post-operative Plan: Extubation in OR  Informed Consent: I have reviewed the patients History and Physical, chart, labs and discussed the procedure including the risks, benefits and alternatives for the proposed anesthesia with the patient or authorized representative who has indicated his/her understanding and acceptance.   Dental advisory given  Plan Discussed with: CRNA  Anesthesia Plan Comments:         Anesthesia Quick Evaluation

## 2018-06-13 ENCOUNTER — Encounter (HOSPITAL_COMMUNITY)
Admission: RE | Disposition: A | Discharge status: Home / Self Care | Payer: Self-pay | Source: Ambulatory Visit | Attending: Neurological Surgery

## 2018-06-13 ENCOUNTER — Encounter (HOSPITAL_COMMUNITY): Payer: Self-pay

## 2018-06-13 ENCOUNTER — Observation Stay (HOSPITAL_COMMUNITY)
Admission: RE | Admit: 2018-06-13 | Discharge: 2018-06-13 | Disposition: A | Discharge status: Home / Self Care | Payer: Medicare Other | Source: Ambulatory Visit | Attending: Neurological Surgery | Admitting: Neurological Surgery

## 2018-06-13 ENCOUNTER — Other Ambulatory Visit: Payer: Self-pay

## 2018-06-13 ENCOUNTER — Inpatient Hospital Stay (HOSPITAL_COMMUNITY): Payer: Medicare Other | Admitting: Anesthesiology

## 2018-06-13 ENCOUNTER — Inpatient Hospital Stay (HOSPITAL_COMMUNITY): Payer: Medicare Other

## 2018-06-13 ENCOUNTER — Inpatient Hospital Stay (HOSPITAL_COMMUNITY): Payer: Medicare Other | Admitting: Vascular Surgery

## 2018-06-13 DIAGNOSIS — Z981 Arthrodesis status: Secondary | ICD-10-CM | POA: Diagnosis not present

## 2018-06-13 DIAGNOSIS — Z7982 Long term (current) use of aspirin: Secondary | ICD-10-CM | POA: Diagnosis not present

## 2018-06-13 DIAGNOSIS — Z6834 Body mass index (BMI) 34.0-34.9, adult: Secondary | ICD-10-CM | POA: Insufficient documentation

## 2018-06-13 DIAGNOSIS — I2510 Atherosclerotic heart disease of native coronary artery without angina pectoris: Secondary | ICD-10-CM | POA: Insufficient documentation

## 2018-06-13 DIAGNOSIS — I252 Old myocardial infarction: Secondary | ICD-10-CM | POA: Insufficient documentation

## 2018-06-13 DIAGNOSIS — M48061 Spinal stenosis, lumbar region without neurogenic claudication: Secondary | ICD-10-CM | POA: Diagnosis not present

## 2018-06-13 DIAGNOSIS — Z9889 Other specified postprocedural states: Secondary | ICD-10-CM

## 2018-06-13 DIAGNOSIS — Z955 Presence of coronary angioplasty implant and graft: Secondary | ICD-10-CM | POA: Diagnosis not present

## 2018-06-13 DIAGNOSIS — Z79899 Other long term (current) drug therapy: Secondary | ICD-10-CM | POA: Insufficient documentation

## 2018-06-13 DIAGNOSIS — I10 Essential (primary) hypertension: Secondary | ICD-10-CM | POA: Diagnosis not present

## 2018-06-13 HISTORY — PX: LUMBAR LAMINECTOMY/DECOMPRESSION MICRODISCECTOMY: SHX5026

## 2018-06-13 SURGERY — LUMBAR LAMINECTOMY/DECOMPRESSION MICRODISCECTOMY 3 LEVELS
Anesthesia: General | Site: Back | Laterality: Bilateral

## 2018-06-13 MED ORDER — ONDANSETRON HCL 4 MG/2ML IJ SOLN
INTRAMUSCULAR | Status: AC
  Filled 2018-06-13: qty 2

## 2018-06-13 MED ORDER — BUPIVACAINE HCL (PF) 0.25 % IJ SOLN
INTRAMUSCULAR | Status: AC
  Filled 2018-06-13: qty 30

## 2018-06-13 MED ORDER — NITROGLYCERIN 0.4 MG SL SUBL: 0.4000 mg | SUBLINGUAL_TABLET | SUBLINGUAL | Status: DC | PRN

## 2018-06-13 MED ORDER — ROCURONIUM BROMIDE 50 MG/5ML IV SOSY
PREFILLED_SYRINGE | INTRAVENOUS | Status: AC
  Filled 2018-06-13: qty 5

## 2018-06-13 MED ORDER — METHOCARBAMOL 1000 MG/10ML IJ SOLN
500.0000 mg | Freq: Four times a day (QID) | INTRAVENOUS | Status: DC | PRN
  Filled 2018-06-13: qty 5

## 2018-06-13 MED ORDER — FENTANYL CITRATE (PF) 100 MCG/2ML IJ SOLN
25.0000 ug | INTRAMUSCULAR | Status: DC | PRN
  Administered 2018-06-13: 50 ug via INTRAVENOUS

## 2018-06-13 MED ORDER — EPHEDRINE SULFATE 50 MG/ML IJ SOLN
INTRAMUSCULAR | Status: DC | PRN
  Administered 2018-06-13 (×5): 10 mg via INTRAVENOUS

## 2018-06-13 MED ORDER — CEFAZOLIN SODIUM-DEXTROSE 2-4 GM/100ML-% IV SOLN
INTRAVENOUS | Status: AC
  Filled 2018-06-13: qty 100

## 2018-06-13 MED ORDER — METOPROLOL TARTRATE 12.5 MG HALF TABLET: 12.5000 mg | ORAL_TABLET | Freq: Two times a day (BID) | ORAL | Status: DC

## 2018-06-13 MED ORDER — HYDROCODONE-ACETAMINOPHEN 5-325 MG PO TABS: 1.0000 | ORAL_TABLET | ORAL | Status: DC | PRN

## 2018-06-13 MED ORDER — LOSARTAN POTASSIUM 50 MG PO TABS
50.0000 mg | ORAL_TABLET | Freq: Every day | ORAL | Status: DC
  Administered 2018-06-13: 50 mg via ORAL
  Filled 2018-06-13: qty 1

## 2018-06-13 MED ORDER — BUPIVACAINE HCL (PF) 0.25 % IJ SOLN
INTRAMUSCULAR | Status: DC | PRN
  Administered 2018-06-13: 3 mL

## 2018-06-13 MED ORDER — EZETIMIBE 10 MG PO TABS
10.0000 mg | ORAL_TABLET | Freq: Every day | ORAL | Status: DC
  Filled 2018-06-13: qty 1

## 2018-06-13 MED ORDER — PHENOL 1.4 % MT LIQD: 1.0000 | OROMUCOSAL | Status: DC | PRN

## 2018-06-13 MED ORDER — THROMBIN 5000 UNITS EX SOLR
CUTANEOUS | Status: AC
  Filled 2018-06-13: qty 5000

## 2018-06-13 MED ORDER — METHOCARBAMOL 500 MG PO TABS
500.0000 mg | ORAL_TABLET | Freq: Four times a day (QID) | ORAL | Status: DC | PRN
  Administered 2018-06-13: 500 mg via ORAL

## 2018-06-13 MED ORDER — METHOCARBAMOL 500 MG PO TABS
ORAL_TABLET | ORAL | Status: AC
  Filled 2018-06-13: qty 1

## 2018-06-13 MED ORDER — LIDOCAINE 2% (20 MG/ML) 5 ML SYRINGE
INTRAMUSCULAR | Status: DC | PRN
  Administered 2018-06-13: 100 mg via INTRAVENOUS

## 2018-06-13 MED ORDER — ARTIFICIAL TEARS OPHTHALMIC OINT
TOPICAL_OINTMENT | OPHTHALMIC | Status: AC
  Filled 2018-06-13: qty 3.5

## 2018-06-13 MED ORDER — FENTANYL CITRATE (PF) 100 MCG/2ML IJ SOLN
INTRAMUSCULAR | Status: AC
  Filled 2018-06-13: qty 2

## 2018-06-13 MED ORDER — ACETAMINOPHEN 325 MG PO TABS: 650.0000 mg | ORAL_TABLET | ORAL | Status: DC | PRN

## 2018-06-13 MED ORDER — LIDOCAINE 2% (20 MG/ML) 5 ML SYRINGE
INTRAMUSCULAR | Status: AC
  Filled 2018-06-13: qty 5

## 2018-06-13 MED ORDER — EPHEDRINE 5 MG/ML INJ
INTRAVENOUS | Status: AC
  Filled 2018-06-13: qty 10

## 2018-06-13 MED ORDER — SODIUM CHLORIDE 0.9 % IV SOLN
INTRAVENOUS | Status: DC | PRN
  Administered 2018-06-13: 08:00:00

## 2018-06-13 MED ORDER — HYDROCODONE-ACETAMINOPHEN 7.5-325 MG PO TABS
ORAL_TABLET | ORAL | Status: AC
  Filled 2018-06-13: qty 1

## 2018-06-13 MED ORDER — SODIUM CHLORIDE 0.9 % IV SOLN
250.0000 mL | INTRAVENOUS | Status: DC
  Administered 2018-06-13: 250 mL via INTRAVENOUS

## 2018-06-13 MED ORDER — HYDROCODONE-ACETAMINOPHEN 7.5-325 MG PO TABS
1.0000 | ORAL_TABLET | Freq: Four times a day (QID) | ORAL | Status: DC
  Administered 2018-06-13: 1 via ORAL

## 2018-06-13 MED ORDER — THROMBIN 5000 UNITS EX SOLR
OROMUCOSAL | Status: DC | PRN
  Administered 2018-06-13: 08:00:00 via TOPICAL

## 2018-06-13 MED ORDER — 0.9 % SODIUM CHLORIDE (POUR BTL) OPTIME
TOPICAL | Status: DC | PRN
  Administered 2018-06-13: 1000 mL

## 2018-06-13 MED ORDER — PROPOFOL 10 MG/ML IV BOLUS
INTRAVENOUS | Status: AC
  Filled 2018-06-13: qty 20

## 2018-06-13 MED ORDER — SENNA 8.6 MG PO TABS: 1.0000 | ORAL_TABLET | Freq: Two times a day (BID) | ORAL | Status: DC

## 2018-06-13 MED ORDER — ASPIRIN EC 81 MG PO TBEC: 81.0000 mg | DELAYED_RELEASE_TABLET | Freq: Every day | ORAL | Status: DC

## 2018-06-13 MED ORDER — DOXAZOSIN MESYLATE 8 MG PO TABS: 8.0000 mg | ORAL_TABLET | Freq: Every day | ORAL | Status: DC

## 2018-06-13 MED ORDER — ALBUMIN HUMAN 5 % IV SOLN
INTRAVENOUS | Status: DC | PRN
  Administered 2018-06-13: 08:00:00 via INTRAVENOUS

## 2018-06-13 MED ORDER — FENTANYL CITRATE (PF) 250 MCG/5ML IJ SOLN
INTRAMUSCULAR | Status: AC
  Filled 2018-06-13: qty 5

## 2018-06-13 MED ORDER — PROPOFOL 10 MG/ML IV BOLUS
INTRAVENOUS | Status: DC | PRN
  Administered 2018-06-13: 100 mg via INTRAVENOUS

## 2018-06-13 MED ORDER — MENTHOL 3 MG MT LOZG: 1.0000 | LOZENGE | OROMUCOSAL | Status: DC | PRN

## 2018-06-13 MED ORDER — THROMBIN 5000 UNITS EX SOLR
CUTANEOUS | Status: AC
  Filled 2018-06-13: qty 10000

## 2018-06-13 MED ORDER — SUGAMMADEX SODIUM 200 MG/2ML IV SOLN
INTRAVENOUS | Status: DC | PRN
  Administered 2018-06-13: 206.8 mg via INTRAVENOUS

## 2018-06-13 MED ORDER — POTASSIUM CHLORIDE CRYS ER 20 MEQ PO TBCR: 20.0000 meq | EXTENDED_RELEASE_TABLET | Freq: Every day | ORAL | Status: DC

## 2018-06-13 MED ORDER — FENTANYL CITRATE (PF) 100 MCG/2ML IJ SOLN
INTRAMUSCULAR | Status: DC | PRN
  Administered 2018-06-13: 100 ug via INTRAVENOUS

## 2018-06-13 MED ORDER — DEXAMETHASONE SODIUM PHOSPHATE 10 MG/ML IJ SOLN
INTRAMUSCULAR | Status: AC
  Filled 2018-06-13: qty 1

## 2018-06-13 MED ORDER — CEFAZOLIN SODIUM-DEXTROSE 2-4 GM/100ML-% IV SOLN
2.0000 g | INTRAVENOUS | Status: AC
  Administered 2018-06-13: 2 g via INTRAVENOUS

## 2018-06-13 MED ORDER — SODIUM CHLORIDE 0.9% FLUSH: 3.0000 mL | INTRAVENOUS | Status: DC | PRN

## 2018-06-13 MED ORDER — SODIUM CHLORIDE 0.9% FLUSH
3.0000 mL | Freq: Two times a day (BID) | INTRAVENOUS | Status: DC
  Administered 2018-06-13: 3 mL via INTRAVENOUS

## 2018-06-13 MED ORDER — CHLORHEXIDINE GLUCONATE CLOTH 2 % EX PADS: 6.0000 | MEDICATED_PAD | Freq: Once | CUTANEOUS | Status: DC

## 2018-06-13 MED ORDER — ONDANSETRON HCL 4 MG/2ML IJ SOLN
INTRAMUSCULAR | Status: DC | PRN
  Administered 2018-06-13: 4 mg via INTRAVENOUS

## 2018-06-13 MED ORDER — HEMOSTATIC AGENTS (NO CHARGE) OPTIME
TOPICAL | Status: DC | PRN
  Administered 2018-06-13: 1 via TOPICAL

## 2018-06-13 MED ORDER — ACETAMINOPHEN 650 MG RE SUPP: 650.0000 mg | RECTAL | Status: DC | PRN

## 2018-06-13 MED ORDER — PHENYLEPHRINE HCL 10 MG/ML IJ SOLN
INTRAMUSCULAR | Status: DC | PRN
  Administered 2018-06-13 (×2): 80 ug via INTRAVENOUS

## 2018-06-13 MED ORDER — CEFAZOLIN SODIUM-DEXTROSE 2-4 GM/100ML-% IV SOLN
2.0000 g | Freq: Three times a day (TID) | INTRAVENOUS | Status: DC
  Administered 2018-06-13: 2 g via INTRAVENOUS
  Filled 2018-06-13: qty 100

## 2018-06-13 MED ORDER — THROMBIN 5000 UNITS EX SOLR
CUTANEOUS | Status: DC | PRN
  Administered 2018-06-13 (×2): 5000 [IU] via TOPICAL

## 2018-06-13 MED ORDER — PANTOPRAZOLE SODIUM 40 MG PO TBEC: 40.0000 mg | DELAYED_RELEASE_TABLET | Freq: Every day | ORAL | Status: DC

## 2018-06-13 MED ORDER — ROCURONIUM BROMIDE 50 MG/5ML IV SOSY
PREFILLED_SYRINGE | INTRAVENOUS | Status: DC | PRN
  Administered 2018-06-13: 30 mg via INTRAVENOUS
  Administered 2018-06-13: 50 mg via INTRAVENOUS
  Administered 2018-06-13: 20 mg via INTRAVENOUS

## 2018-06-13 MED ORDER — POTASSIUM CHLORIDE IN NACL 20-0.9 MEQ/L-% IV SOLN: INTRAVENOUS | Status: DC

## 2018-06-13 MED ORDER — FUROSEMIDE 20 MG PO TABS
20.0000 mg | ORAL_TABLET | Freq: Every day | ORAL | Status: DC
  Filled 2018-06-13: qty 1

## 2018-06-13 MED ORDER — DEXAMETHASONE SODIUM PHOSPHATE 10 MG/ML IJ SOLN
10.0000 mg | INTRAMUSCULAR | Status: AC
  Administered 2018-06-13: 10 mg via INTRAVENOUS

## 2018-06-13 MED ORDER — PHENYLEPHRINE 40 MCG/ML (10ML) SYRINGE FOR IV PUSH (FOR BLOOD PRESSURE SUPPORT)
PREFILLED_SYRINGE | INTRAVENOUS | Status: AC
  Filled 2018-06-13: qty 10

## 2018-06-13 MED ORDER — ONDANSETRON HCL 4 MG PO TABS: 4.0000 mg | ORAL_TABLET | Freq: Four times a day (QID) | ORAL | Status: DC | PRN

## 2018-06-13 MED ORDER — ONDANSETRON HCL 4 MG/2ML IJ SOLN: 4.0000 mg | Freq: Four times a day (QID) | INTRAMUSCULAR | Status: DC | PRN

## 2018-06-13 MED ORDER — LACTATED RINGERS IV SOLN
INTRAVENOUS | Status: DC | PRN
  Administered 2018-06-13: 07:00:00 via INTRAVENOUS

## 2018-06-13 MED ORDER — SODIUM CHLORIDE 0.9 % IV SOLN
INTRAVENOUS | Status: DC | PRN
  Administered 2018-06-13: 50 ug/min via INTRAVENOUS

## 2018-06-13 MED ORDER — ARTIFICIAL TEARS OPHTHALMIC OINT
TOPICAL_OINTMENT | OPHTHALMIC | Status: DC | PRN
  Administered 2018-06-13: 1 via OPHTHALMIC

## 2018-06-13 MED ORDER — HYDROMORPHONE HCL 1 MG/ML IJ SOLN: 0.5000 mg | INTRAMUSCULAR | Status: DC | PRN

## 2018-06-13 SURGICAL SUPPLY — 45 items
BAG DECANTER FOR FLEXI CONT (MISCELLANEOUS) ×3 IMPLANT
BENZOIN TINCTURE PRP APPL 2/3 (GAUZE/BANDAGES/DRESSINGS) ×3 IMPLANT
BUR MATCHSTICK NEURO 3.0 LAGG (BURR) ×3 IMPLANT
CANISTER SUCT 3000ML PPV (MISCELLANEOUS) ×3 IMPLANT
CARTRIDGE OIL MAESTRO DRILL (MISCELLANEOUS) ×1 IMPLANT
CLOSURE WOUND 1/2 X4 (GAUZE/BANDAGES/DRESSINGS) ×1
DERMABOND ADVANCED (GAUZE/BANDAGES/DRESSINGS) ×2
DERMABOND ADVANCED .7 DNX12 (GAUZE/BANDAGES/DRESSINGS) ×1 IMPLANT
DIFFUSER DRILL AIR PNEUMATIC (MISCELLANEOUS) ×3 IMPLANT
DRAPE LAPAROTOMY 100X72X124 (DRAPES) ×3 IMPLANT
DRAPE MICROSCOPE LEICA (MISCELLANEOUS) ×3 IMPLANT
DRAPE SURG 17X23 STRL (DRAPES) ×3 IMPLANT
DRSG OPSITE POSTOP 4X6 (GAUZE/BANDAGES/DRESSINGS) ×3 IMPLANT
DURAPREP 26ML APPLICATOR (WOUND CARE) ×3 IMPLANT
ELECT REM PT RETURN 9FT ADLT (ELECTROSURGICAL) ×3
ELECTRODE REM PT RTRN 9FT ADLT (ELECTROSURGICAL) ×1 IMPLANT
GAUZE 4X4 16PLY RFD (DISPOSABLE) IMPLANT
GLOVE BIO SURGEON STRL SZ7 (GLOVE) IMPLANT
GLOVE BIO SURGEON STRL SZ8 (GLOVE) ×3 IMPLANT
GLOVE BIOGEL PI IND STRL 7.0 (GLOVE) IMPLANT
GLOVE BIOGEL PI INDICATOR 7.0 (GLOVE)
GOWN STRL REUS W/ TWL LRG LVL3 (GOWN DISPOSABLE) IMPLANT
GOWN STRL REUS W/ TWL XL LVL3 (GOWN DISPOSABLE) ×1 IMPLANT
GOWN STRL REUS W/TWL 2XL LVL3 (GOWN DISPOSABLE) IMPLANT
GOWN STRL REUS W/TWL LRG LVL3 (GOWN DISPOSABLE)
GOWN STRL REUS W/TWL XL LVL3 (GOWN DISPOSABLE) ×2
HEMOSTAT POWDER KIT SURGIFOAM (HEMOSTASIS) IMPLANT
KIT BASIN OR (CUSTOM PROCEDURE TRAY) ×3 IMPLANT
KIT TURNOVER KIT B (KITS) ×3 IMPLANT
NEEDLE HYPO 25X1 1.5 SAFETY (NEEDLE) ×3 IMPLANT
NEEDLE SPNL 20GX3.5 QUINCKE YW (NEEDLE) IMPLANT
NS IRRIG 1000ML POUR BTL (IV SOLUTION) ×3 IMPLANT
OIL CARTRIDGE MAESTRO DRILL (MISCELLANEOUS) ×3
PACK LAMINECTOMY NEURO (CUSTOM PROCEDURE TRAY) ×3 IMPLANT
PAD ARMBOARD 7.5X6 YLW CONV (MISCELLANEOUS) ×9 IMPLANT
RUBBERBAND STERILE (MISCELLANEOUS) ×6 IMPLANT
SPONGE SURGIFOAM ABS GEL SZ50 (HEMOSTASIS) IMPLANT
STRIP CLOSURE SKIN 1/2X4 (GAUZE/BANDAGES/DRESSINGS) ×2 IMPLANT
SUT VIC AB 0 CT1 18XCR BRD8 (SUTURE) ×1 IMPLANT
SUT VIC AB 0 CT1 8-18 (SUTURE) ×2
SUT VIC AB 2-0 CP2 18 (SUTURE) ×3 IMPLANT
SUT VIC AB 3-0 SH 8-18 (SUTURE) ×3 IMPLANT
TOWEL GREEN STERILE (TOWEL DISPOSABLE) ×3 IMPLANT
TOWEL GREEN STERILE FF (TOWEL DISPOSABLE) ×3 IMPLANT
WATER STERILE IRR 1000ML POUR (IV SOLUTION) ×3 IMPLANT

## 2018-06-13 NOTE — Anesthesia Postprocedure Evaluation (Signed)
Anesthesia Post Note  Patient: Noah Scott  Procedure(s) Performed: Laminectomy and Foraminotomy - L1-L2 - L2-L3 - L3-L4 - bilateral (Bilateral Back)     Patient location during evaluation: PACU Anesthesia Type: General Level of consciousness: awake and alert Pain management: pain level controlled Vital Signs Assessment: post-procedure vital signs reviewed and stable Respiratory status: spontaneous breathing, nonlabored ventilation, respiratory function stable and patient connected to nasal cannula oxygen Cardiovascular status: blood pressure returned to baseline and stable Postop Assessment: no apparent nausea or vomiting Anesthetic complications: no    Last Vitals:  Vitals:   06/13/18 1000 06/13/18 1030  BP:  111/66  Pulse: 81 87  Resp: (!) 28   Temp:    SpO2: 94% 96%    Last Pain:  Vitals:   06/13/18 0954  TempSrc:   PainSc: 5                  Cloria Ciresi L [place] Rogacki

## 2018-06-13 NOTE — Anesthesia Procedure Notes (Signed)
Procedure Name: Intubation Date/Time: 06/13/2018 7:35 AM Performed by: Scheryl Darter, CRNA Pre-anesthesia Checklist: Patient identified, Emergency Drugs available, Suction available and Patient being monitored Patient Re-evaluated:Patient Re-evaluated prior to induction Oxygen Delivery Method: Circle System Utilized Preoxygenation: Pre-oxygenation with 100% oxygen Induction Type: IV induction Ventilation: Mask ventilation without difficulty Laryngoscope Size: Miller and 2 Grade View: Grade I Tube type: Oral Tube size: 7.5 mm Number of attempts: 1 Airway Equipment and Method: Stylet and Oral airway Placement Confirmation: ETT inserted through vocal cords under direct vision,  positive ETCO2 and breath sounds checked- equal and bilateral Secured at: 22 cm Tube secured with: Tape Dental Injury: Teeth and Oropharynx as per pre-operative assessment  Comments: Intubated by  [facility]

## 2018-06-13 NOTE — Evaluation (Signed)
Occupational Therapy Evaluation and Discharge Patient Details Name: Noah Scott MRN: [id number] DOB: 1936-07-13 Today's Date: 06/13/2018    History of Present Illness Pt s/p decompressive lumbar laminectomy, medial facetectomy and foraminotomies L1-2-L2-3-L3-4   Clinical Impression   This 82 yo male admitted and underwent above presents to acute OT with all education competed, we will D/C from acute OT.    Follow Up Recommendations  No OT follow up    Equipment Recommendations  None recommended by OT       Precautions / Restrictions Precautions Precautions: Back Precaution Booklet Issued: Yes (comment) Required Braces or Orthoses: (none)      Mobility Bed Mobility               General bed mobility comments: Demonstrated supine <> sitting EOB  Transfers Overall transfer level: Independent                        ADL either performed or assessed with clinical judgement   ADL                                         General ADL Comments: Educated pt on sequence of dressing, mode of dressing, use of 2 cups for brushing teeth, use of wet wipes for pericare, not sitting more than 20-30 minutes at a time, having items at counter top level (not too high or to low), better stance for sit<>stand to keep back straight.. Pt able to don all clothing following back precautiions without AE     Vision Baseline Vision/History: No visual deficits              Pertinent Vitals/Pain Pain Assessment: 0-10 Pain Score: 4  Pain Location: incisional site Pain Descriptors / Indicators: Sore Pain Intervention(s): Limited activity within patient's tolerance;Monitored during session     Hand Dominance Right   Extremity/Trunk Assessment Upper Extremity Assessment Upper Extremity Assessment: Overall WFL for tasks assessed           Communication Communication Communication: No difficulties   Cognition Arousal/Alertness: Awake/alert Behavior  During Therapy: WFL for tasks assessed/performed Overall Cognitive Status: Within Functional Limits for tasks assessed                                                Home Living Family/patient expects to be discharged to:: Private residence Living Arrangements: Alone Available Help at Discharge: Family(at night grandson) Type of Home: House Home Access: Level entry     Home Layout: One level     Bathroom Shower/Tub: Occupational psychologist: Standard     Home Equipment: None          Prior Functioning/Environment Level of Independence: Independent                 OT Problem List: Decreased range of motion;Pain         OT Goals(Current goals can be found in the care plan section) Acute Rehab OT Goals Patient Stated Goal: home today  OT Frequency:                AM-PAC PT "6 Clicks" Daily Activity     Outcome Measure Help from another person eating meals?: None Help from another person  taking care of personal grooming?: None Help from another person toileting, which includes using toliet, bedpan, or urinal?: None Help from another person bathing (including washing, rinsing, drying)?: None Help from another person to put on and taking off regular upper body clothing?: None Help from another person to put on and taking off regular lower body clothing?: None 6 Click Score: 24   End of Session Equipment Utilized During Treatment: (none) Nurse Communication: (pt ready to go from OT standpoint)  Activity Tolerance: Patient tolerated treatment well Patient left: in chair;with call bell/phone within reach  OT Visit Diagnosis: Pain Pain - part of body: (back)                Time: 1600-1636 OT Time Calculation (min): 36 min Charges:  OT General Charges $OT Visit: 1 Visit OT Evaluation $OT Eval Moderate Complexity: 1 Mod OT Treatments $Self Care/Home Management : 8-22 mins  [address], OTR/L Acute [employer] Pager  [phone] Office [phone]

## 2018-06-13 NOTE — H&P (Signed)
Subjective: Patient is a 82 y.o. male admitted for LL. Onset of symptoms was several months ago, gradually worsening since that time.  The pain is rated severe, and is located at the across the lower back and radiates to legs. The pain is described as aching and occurs intermittently. The symptoms have been progressive. Symptoms are exacerbated by exercise. MRI or CT showed stenosis L1-3   Past Medical History:  Diagnosis Date  . CAD S/P percutaneous coronary angioplasty 2001   a) 2001 ([place]): PCI OM1 - now with ~65% ISR.;; b) NSTEMI 11/2017 - ost-prox RCA 75-65% (Overlapping ONYX DES 3.0 x 26 & 3.0 x 8 --> 3.5-3.3 mm). m-dRCA 95% (ONYX DES 2.75 x 18 mm -> 3.0 mm). Ost OM1 65% ISR (med Rx). dRCA-OstRDA (50%-25%)  . Essential (primary) hypertension 11/21/2017  . Hyperlipidemia 11/21/2017  . Neurogenic claudication due to lumbar spinal stenosis 02/06/2018  . NSTEMI (non-ST elevated myocardial infarction) ([place]) 11/21/2017   Multiple RCA lesions - 2 Overlapping DES Ost-Prox RCA & 1 in mid-distal RCA    Past Surgical History:  Procedure Laterality Date  . CORONARY STENT INTERVENTION N/A 11/25/2017   Procedure: CORONARY STENT INTERVENTION;  Surgeon: Leonie Man, MD;  Location: [facility];  Service: Cardiovascular:   m-dRCA 95%  (DES PCI - 0%.  Resolute ONYX DES 2.75 x 18 mm - ~3.0 mm). Ost-proxRCA 75-65% (DES PCI-0%. 2 overlapping Resolute ONYX DES 3.0 x 26 & 3.0 x 8 mm --> 3.5-3.3 mm)  . CORONARY STENT INTERVENTION  2001   ([place]): PCI OM1  . LEA DOPPLERS  12/12/2017   Normal bilateral ABIs and TBI's.  Marland Kitchen LEFT HEART CATH AND CORONARY ANGIOGRAPHY N/A 11/25/2017   Procedure: LEFT HEART CATH AND CORONARY ANGIOGRAPHY;  Surgeon: Leonie Man, MD;  Location: [facility];  Service: Cardiovascular:  Ost-proxRCA 75&65%(DES PCI), m-dRCA 95% (DES PCI), dRCA 50%-25% ost rPDA. ostOM1 65% ISR (from 2001).   . TRANSTHORACIC ECHOCARDIOGRAM  11/23/2017   Normal LV size and function.  EF  60 to 65%.  No RWMA.  GR 1 DD.  Mild RV dilation.    Prior to Admission medications   Medication Sig Start Date End Date Taking? Authorizing Provider  Aromatic Inhalants (VICKS VAPOR INHALER IN) Inhale 1 Inhaler into the lungs as needed (stuffy nose).   Yes [provider]  aspirin EC 81 MG tablet Take 81 mg by mouth daily.   Yes [provider]  cyanocobalamin (,VITAMIN B-12,) 1000 MCG/ML injection Inject 1,000 mcg into the muscle every 30 (thirty) days.   Yes [provider]  doxazosin (CARDURA) 8 MG tablet Take 8 mg by mouth daily.   Yes [provider]  ezetimibe (ZETIA) 10 MG tablet TAKE 1 TABLET(10 MG) BY MOUTH DAILY 06/06/18  Yes Leonie Man, MD  furosemide (LASIX) 20 MG tablet Take 1 tablet (20 mg total) by mouth daily. 03/17/18  Yes Leonie Man, MD  losartan (COZAAR) 50 MG tablet Take 1 tablet (50 mg total) by mouth daily. 11/26/17  Yes Barrett, Evelene Croon, PA-C  metoprolol tartrate (LOPRESSOR) 25 MG tablet Take 0.5 tablets (12.5 mg total) by mouth 2 (two) times daily. 12/04/17  Yes Leonie Man, MD  potassium chloride SA (K-DUR,KLOR-CON) 20 MEQ tablet Take 1 tablet (20 mEq total) by mouth daily. 11/26/17  Yes Barrett, Evelene Croon, PA-C  rosuvastatin (CRESTOR) 40 MG tablet Take 1 tablet (40 mg total) by mouth daily. 12/04/17  Yes Leonie Man,  MD  clopidogrel (PLAVIX) 75 MG tablet Take 1 tablet (75 mg total) by mouth daily. 06/09/18   Leonie Man, MD  nitroGLYCERIN (NITROSTAT) 0.4 MG SL tablet Place 1 tablet (0.4 mg total) under the tongue every 5 (five) minutes as needed for chest pain. 11/26/17   Barrett, Evelene Croon, PA-C  pantoprazole (PROTONIX) 40 MG tablet Take 1 tablet (40 mg total) by mouth daily. 11/26/17   Barrett, Evelene Croon, PA-C   Allergies  Allergen Reactions  . Enalapril   . Plavix [Clopidogrel Bisulfate]     Social History   Tobacco Use  . Smoking status: Never Smoker  . Smokeless tobacco: Never Used  Substance Use Topics  .  Alcohol use: Not Currently    Family History  Problem Relation Age of Onset  . Heart disease Father        Pt does not know details  . Stroke Neg Hx   . Diabetes Neg Hx      Review of Systems  Positive ROS: neg  All other systems have been reviewed and were otherwise negative with the exception of those mentioned in the HPI and as above.  Objective: Vital signs in last 24 hours: Temp:  [97.9 F (36.6 C)] 97.9 F (36.6 C) (09/27 0537) Pulse Rate:  [66] 66 (09/27 0537) Resp:  [18] 18 (09/27 0537) BP: (160)/(82) 160/82 (09/27 0537) SpO2:  [100 %] 100 % (09/27 0537) Weight:  [103.4 kg] 103.4 kg (09/27 0645)  General Appearance: Alert, cooperative, no distress, appears stated age Head: Normocephalic, without obvious abnormality, atraumatic Eyes: PERRL, conjunctiva/corneas clear, EOM's intact    Neck: Supple, symmetrical, trachea midline Back: Symmetric, no curvature, ROM normal, no CVA tenderness Lungs:  respirations unlabored Heart: Regular rate and rhythm Abdomen: Soft, non-tender Extremities: Extremities normal, atraumatic, no cyanosis or edema Pulses: 2+ and symmetric all extremities Skin: Skin color, texture, turgor normal, no rashes or lesions  NEUROLOGIC:   Mental status: Alert and oriented x4,  no aphasia, good attention span, fund of knowledge, and memory Motor Exam - grossly normal Sensory Exam - grossly normal Reflexes: 1+ Coordination - grossly normal Gait - grossly normal Balance - grossly normal Cranial Nerves: I: smell Not tested  II: visual acuity  OS: nl    OD: nl  II: visual fields Full to confrontation  II: pupils Equal, round, reactive to light  III,VII: ptosis None  III,IV,VI: extraocular muscles  Full ROM  V: mastication Normal  V: facial light touch sensation  Normal  V,VII: corneal reflex  Present  VII: facial muscle function - upper  Normal  VII: facial muscle function - lower Normal  VIII: hearing Not tested  IX: soft palate elevation   Normal  IX,X: gag reflex Present  XI: trapezius strength  5/5  XI: sternocleidomastoid strength 5/5  XI: neck flexion strength  5/5  XII: tongue strength  Normal    Data Review Lab Results  Component Value Date   WBC 5.2 06/04/2018   HGB 14.1 06/04/2018   HCT 42.2 06/04/2018   MCV 93.4 06/04/2018   PLT 172 06/04/2018   Lab Results  Component Value Date   NA 141 06/04/2018   K 3.8 06/04/2018   CL 106 06/04/2018   CO2 27 06/04/2018   BUN 14 06/04/2018   CREATININE 1.09 06/04/2018   GLUCOSE 113 (H) 06/04/2018   Lab Results  Component Value Date   INR 1.06 06/04/2018    Assessment/Plan:  Estimated body mass index is 34.65 kg/m as  calculated from the following:   Height as of this encounter: 5\' 8"  (1.727 m).   Weight as of this encounter: 103.4 kg. Patient admitted for LL L1-3. Patient has failed a reasonable attempt at conservative therapy.  I explained the condition and procedure to the patient and answered any questions.  Patient wishes to proceed with procedure as planned. Understands risks/ benefits and typical outcomes of procedure.   Chanti Golubski S 06/13/2018 7:11 AM

## 2018-06-13 NOTE — Op Note (Signed)
06/13/2018  9:58 AM  PATIENT:  Noah Scott  82 y.o. male  PRE-OPERATIVE DIAGNOSIS:  lumbar spinal stenosis L1-2, L2 3 L3-4  POST-OPERATIVE DIAGNOSIS:  same  PROCEDURE: Decompressive lumbar laminectomy, medial facetectomy and foraminotomies L1-2-L2-3-L3-4  SURGEON:  Sherley Bounds, MD  ASSISTANTS: Margo Aye FNP  ANESTHESIA:   General  EBL: 200 ml  Total I/O In: 1150 [I.V.:900; IV Piggyback:250] Out: 200 [Blood:200]  BLOOD ADMINISTERED: none  DRAINS: none  SPECIMEN:  none  INDICATION FOR PROCEDURE: This patient presented with bilateral leg pain. Imaging showed stenosis L1-L3. The patient tried conservative measures without relief. Pain was debilitating. Recommended compressive laminectomy. Patient understood the risks, benefits, and alternatives and potential outcomes and wished to proceed.  PROCEDURE DETAILS: The patient was taken to the operating room and after induction of adequate generalized endotracheal anesthesia, the patient was rolled into the prone position on the Wilson frame and all pressure points were padded. The lumbar region was cleaned and then prepped with DuraPrep and draped in the usual sterile fashion. 5 cc of local anesthesia was injected and then a dorsal midline incision was made and carried down to the lumbo sacral fascia. The fascia was opened and the paraspinous musculature was taken down in a subperiosteal fashion to expose L1-2 L2 3 and L3-4 bilaterally. Intraoperative x-ray confirmed my level, and then I moved the spinous processes of L2 and L3 and the bottom of L1 and used a combination of the high-speed drill and the Kerrison punches to perform a laminectomy, medial facetectomy, and foraminotomy at L1-2  L2-3 and L3-4 bilaterally. The underlying yellow ligament was opened and removed in a piecemeal fashion to expose the underlying dura and exiting nerve root. I undercut the lateral recess and dissected down until I was medial to and distal to the pedicle.  The nerve root was well decompressed at each level.  I then palpated with a coronary dilator along the nerve root and into the foramen to assure adequate decompression. I felt no more compression of the nerve root at any level. I irrigated with saline solution containing bacitracin. Achieved hemostasis with bipolar cautery, lined the dura with Gelfoam, and then closed the fascia with 0 Vicryl. I closed the subcutaneous tissues with 2-0 Vicryl and the subcuticular tissues with 3-0 Vicryl. The skin was then closed with benzoin and Steri-Strips. The drapes were removed, a sterile dressing was applied. The patient was awakened from general anesthesia and transferred to the recovery room in stable condition. At the end of the procedure all sponge, needle and instrument counts were correct.    PLAN OF CARE: Admit for overnight observation  PATIENT DISPOSITION:  PACU - hemodynamically stable.   Delay start of Pharmacological VTE agent (>24hrs) due to surgical blood loss or risk of bleeding:  yes

## 2018-06-13 NOTE — Progress Notes (Signed)
Patient alert and oriented, mae's well, voiding adequate amount of urine, swallowing without difficulty, no c/o pain at time of discharge. Patient discharged home with family. Script and discharged instructions given to patient. Patient and family stated understanding of instructions given. Patient has an appointment with Dr. Jones °

## 2018-06-13 NOTE — Transfer of Care (Signed)
Immediate Anesthesia Transfer of Care Note  Patient: Noah Scott  Procedure(s) Performed: Laminectomy and Foraminotomy - L1-L2 - L2-L3 - L3-L4 - bilateral (Bilateral Back)  Patient Location: PACU  Anesthesia Type:General  Level of Consciousness: awake, alert , oriented and sedated  Airway & Oxygen Therapy: Patient Spontanous Breathing and Patient connected to face mask oxygen  Post-op Assessment: Report given to RN, Post -op Vital signs reviewed and stable and Patient moving all extremities  Post vital signs: Reviewed and stable  Last Vitals:  Vitals Value Taken Time  BP 136/99 06/13/2018  9:54 AM  Temp 36.3 C 06/13/2018  9:54 AM  Pulse 87 06/13/2018  9:56 AM  Resp 26 06/13/2018  9:56 AM  SpO2 96 % 06/13/2018  9:56 AM  Vitals shown include unvalidated device data.  Last Pain:  Vitals:   06/13/18 0645  TempSrc:   PainSc: 8       Patients Stated Pain Goal: 3 (97/41/63 8453)  Complications: No apparent anesthesia complications

## 2018-06-13 NOTE — Discharge Summary (Signed)
Physician Discharge Summary  Patient ID: Noah Scott MRN: [id number] DOB/AGE: 1935/12/28 82 y.o.  Admit date: 06/13/2018 Discharge date: 06/13/2018  Admission Diagnoses: lumbar stenosis    Discharge Diagnoses: same   Discharged Condition: good  Hospital Course: The patient was admitted on 06/13/2018 and taken to the operating room where the patient underwent LL for stenosis. The patient tolerated the procedure well and was taken to the recovery room and then to the floor in stable condition. The hospital course was routine. There were no complications. The wound remained clean dry and intact. Pt had appropriate back soreness. No complaints of leg pain or new N/T/W. The patient remained afebrile with stable vital signs, and tolerated a regular diet. The patient continued to increase activities, and pain was well controlled with oral pain medications.   Consults: None  Significant Diagnostic Studies:  Results for orders placed or performed during the hospital encounter of 06/04/18  Surgical pcr screen  Result Value Ref Range   MRSA, PCR NEGATIVE NEGATIVE   Staphylococcus aureus NEGATIVE NEGATIVE  Basic metabolic panel  Result Value Ref Range   Sodium 141 135 - 145 mmol/L   Potassium 3.8 3.5 - 5.1 mmol/L   Chloride 106 98 - 111 mmol/L   CO2 27 22 - 32 mmol/L   Glucose, Bld 113 (H) 70 - 99 mg/dL   BUN 14 8 - 23 mg/dL   Creatinine, Ser 1.09 0.61 - 1.24 mg/dL   Calcium 9.1 8.9 - 10.3 mg/dL   GFR calc non Af Amer >60 >60 mL/min   GFR calc Af Amer >60 >60 mL/min   Anion gap 8 5 - 15  CBC WITH DIFFERENTIAL  Result Value Ref Range   WBC 5.2 4.0 - 10.5 K/uL   RBC 4.52 4.22 - 5.81 MIL/uL   Hemoglobin 14.1 13.0 - 17.0 g/dL   HCT 42.2 39.0 - 52.0 %   MCV 93.4 78.0 - 100.0 fL   MCH 31.2 26.0 - 34.0 pg   MCHC 33.4 30.0 - 36.0 g/dL   RDW 12.9 11.5 - 15.5 %   Platelets 172 150 - 400 K/uL   Neutrophils Relative % 59 %   Neutro Abs 3.0 1.7 - 7.7 K/uL   Lymphocytes Relative 24 %    Lymphs Abs 1.3 0.7 - 4.0 K/uL   Monocytes Relative 13 %   Monocytes Absolute 0.7 0.1 - 1.0 K/uL   Eosinophils Relative 3 %   Eosinophils Absolute 0.2 0.0 - 0.7 K/uL   Basophils Relative 1 %   Basophils Absolute 0.0 0.0 - 0.1 K/uL   Immature Granulocytes 0 %   Abs Immature Granulocytes 0.0 0.0 - 0.1 K/uL  Protime-INR  Result Value Ref Range   Prothrombin Time 13.7 11.4 - 15.2 seconds   INR 1.06     Dg Lumbar Spine 2-3 Views  Result Date: 06/13/2018 CLINICAL DATA:  Intraoperative localization EXAM: LUMBAR SPINE - 2-3 VIEW COMPARISON:  02/20/2018 FINDINGS: Two lateral views of the lumbar spine were obtained. The initial image demonstrates a needle within the posterior soft tissues at the L2-3 level. Numbering nomenclature is similar to that utilized on prior MRI examination. The second film shows surgical instruments and retractors just above the L4-5 interspace. IMPRESSION: Intraoperative localization. Electronically Signed   By: Inez Catalina M.D.   On: 06/13/2018 09:36    Antibiotics:  Anti-infectives (From admission, onward)   Start     Dose/Rate Route Frequency Ordered Stop   06/13/18 1600  ceFAZolin (ANCEF) IVPB 2g/100 mL  premix     2 g 200 mL/hr over 30 Minutes Intravenous Every 8 hours 06/13/18 1152 06/14/18 0759   06/13/18 0805  bacitracin 50,000 Units in sodium chloride 0.9 % 500 mL irrigation  Status:  Discontinued       As needed 06/13/18 0805 06/13/18 0948   06/13/18 0630  ceFAZolin (ANCEF) IVPB 2g/100 mL premix     2 g 200 mL/hr over 30 Minutes Intravenous On call to O.R. 06/13/18 0622 06/13/18 0743   06/13/18 0628  ceFAZolin (ANCEF) 2-4 GM/100ML-% IVPB    Note to Pharmacy:  Tamsen Snider   : cabinet override      06/13/18 0628 06/13/18 0743      Discharge Exam: Blood pressure (!) 144/73, pulse 90, temperature 97.7 F (36.5 C), temperature source Oral, resp. rate 16, height 5\' 8"  (1.727 m), weight 103.4 kg, SpO2 97 %. Neurologic: Grossly normal Dressing  dry  Discharge Medications:   Allergies as of 06/13/2018      Reactions   Enalapril    Plavix [clopidogrel Bisulfate]       Medication List    TAKE these medications   aspirin EC 81 MG tablet Take 81 mg by mouth daily.   clopidogrel 75 MG tablet Commonly known as:  PLAVIX Take 1 tablet (75 mg total) by mouth daily.   cyanocobalamin 1000 MCG/ML injection Commonly known as:  (VITAMIN B-12) Inject 1,000 mcg into the muscle every 30 (thirty) days.   doxazosin 8 MG tablet Commonly known as:  CARDURA Take 8 mg by mouth daily.   ezetimibe 10 MG tablet Commonly known as:  ZETIA TAKE 1 TABLET(10 MG) BY MOUTH DAILY   furosemide 20 MG tablet Commonly known as:  LASIX Take 1 tablet (20 mg total) by mouth daily.   losartan 50 MG tablet Commonly known as:  COZAAR Take 1 tablet (50 mg total) by mouth daily.   metoprolol tartrate 25 MG tablet Commonly known as:  LOPRESSOR Take 0.5 tablets (12.5 mg total) by mouth 2 (two) times daily.   nitroGLYCERIN 0.4 MG SL tablet Commonly known as:  NITROSTAT Place 1 tablet (0.4 mg total) under the tongue every 5 (five) minutes as needed for chest pain.   pantoprazole 40 MG tablet Commonly known as:  PROTONIX Take 1 tablet (40 mg total) by mouth daily.   potassium chloride SA 20 MEQ tablet Commonly known as:  K-DUR,KLOR-CON Take 1 tablet (20 mEq total) by mouth daily.   rosuvastatin 40 MG tablet Commonly known as:  CRESTOR Take 1 tablet (40 mg total) by mouth daily.   VICKS VAPOR INHALER IN Inhale 1 Inhaler into the lungs as needed (stuffy nose).       Disposition: home   Final Dx: LL for stenosis  Discharge Instructions     Remove dressing in 72 hours   Complete by:  As directed    Call MD for:  difficulty breathing, headache or visual disturbances   Complete by:  As directed    Call MD for:  persistant nausea and vomiting   Complete by:  As directed    Call MD for:  redness, tenderness, or signs of infection (pain,  swelling, redness, odor or green/yellow discharge around incision site)   Complete by:  As directed    Call MD for:  severe uncontrolled pain   Complete by:  As directed    Call MD for:  temperature >100.4   Complete by:  As directed    Diet - low sodium  heart healthy   Complete by:  As directed    Increase activity slowly   Complete by:  As directed       Follow-up Information    Eustace Moore, MD. Schedule an appointment as soon as possible for a visit in 2 week(s).   Specialty:  Neurosurgery Contact information: [address] [place] [address] [phone]            Signed: Eustace Moore 06/13/2018, 2:05 PM

## 2018-06-13 NOTE — Progress Notes (Signed)
Mr Goodley had an uneventful pacu stay after initial arrival/ has had minimal discomfort, relieved with meds/ he is ambulatory with only slight discomfort, has voided, walked and ate 100% of his lunch meal. His legs "feel fine" and he states he has " a little tighness in my back". Daughter has visited , left for home but understands he may discharge later today.

## 2018-06-14 ENCOUNTER — Encounter (HOSPITAL_COMMUNITY): Payer: Self-pay | Admitting: Neurological Surgery

## 2018-06-16 DIAGNOSIS — E538 Deficiency of other specified B group vitamins: Secondary | ICD-10-CM | POA: Diagnosis not present

## 2018-06-16 DIAGNOSIS — Z23 Encounter for immunization: Secondary | ICD-10-CM | POA: Diagnosis not present

## 2018-06-25 DIAGNOSIS — R3914 Feeling of incomplete bladder emptying: Secondary | ICD-10-CM | POA: Diagnosis not present

## 2018-06-25 DIAGNOSIS — M5440 Lumbago with sciatica, unspecified side: Secondary | ICD-10-CM | POA: Diagnosis not present

## 2018-06-25 DIAGNOSIS — I2510 Atherosclerotic heart disease of native coronary artery without angina pectoris: Secondary | ICD-10-CM | POA: Diagnosis not present

## 2018-06-25 DIAGNOSIS — Z1389 Encounter for screening for other disorder: Secondary | ICD-10-CM | POA: Diagnosis not present

## 2018-06-25 DIAGNOSIS — Z23 Encounter for immunization: Secondary | ICD-10-CM | POA: Diagnosis not present

## 2018-06-25 DIAGNOSIS — R7303 Prediabetes: Secondary | ICD-10-CM | POA: Diagnosis not present

## 2018-06-25 DIAGNOSIS — Z0000 Encounter for general adult medical examination without abnormal findings: Secondary | ICD-10-CM | POA: Diagnosis not present

## 2018-06-25 DIAGNOSIS — I10 Essential (primary) hypertension: Secondary | ICD-10-CM | POA: Diagnosis not present

## 2018-06-25 DIAGNOSIS — I214 Non-ST elevation (NSTEMI) myocardial infarction: Secondary | ICD-10-CM | POA: Diagnosis not present

## 2018-06-25 DIAGNOSIS — N401 Enlarged prostate with lower urinary tract symptoms: Secondary | ICD-10-CM | POA: Diagnosis not present

## 2018-06-25 DIAGNOSIS — Z125 Encounter for screening for malignant neoplasm of prostate: Secondary | ICD-10-CM | POA: Diagnosis not present

## 2018-07-13 ENCOUNTER — Other Ambulatory Visit: Payer: Self-pay

## 2018-07-13 ENCOUNTER — Emergency Department (HOSPITAL_BASED_OUTPATIENT_CLINIC_OR_DEPARTMENT_OTHER)
Admission: EM | Admit: 2018-07-13 | Discharge: 2018-07-13 | Disposition: A | Discharge status: Home / Self Care | Payer: Medicare Other | Attending: Emergency Medicine | Admitting: Emergency Medicine

## 2018-07-13 ENCOUNTER — Encounter (HOSPITAL_BASED_OUTPATIENT_CLINIC_OR_DEPARTMENT_OTHER): Payer: Self-pay | Admitting: Emergency Medicine

## 2018-07-13 DIAGNOSIS — L299 Pruritus, unspecified: Secondary | ICD-10-CM | POA: Insufficient documentation

## 2018-07-13 DIAGNOSIS — Z955 Presence of coronary angioplasty implant and graft: Secondary | ICD-10-CM | POA: Insufficient documentation

## 2018-07-13 DIAGNOSIS — I252 Old myocardial infarction: Secondary | ICD-10-CM | POA: Insufficient documentation

## 2018-07-13 DIAGNOSIS — T45525A Adverse effect of antithrombotic drugs, initial encounter: Secondary | ICD-10-CM | POA: Diagnosis not present

## 2018-07-13 DIAGNOSIS — I2510 Atherosclerotic heart disease of native coronary artery without angina pectoris: Secondary | ICD-10-CM | POA: Diagnosis not present

## 2018-07-13 DIAGNOSIS — I10 Essential (primary) hypertension: Secondary | ICD-10-CM | POA: Diagnosis not present

## 2018-07-13 DIAGNOSIS — R220 Localized swelling, mass and lump, head: Secondary | ICD-10-CM | POA: Diagnosis present

## 2018-07-13 DIAGNOSIS — T7840XA Allergy, unspecified, initial encounter: Secondary | ICD-10-CM | POA: Diagnosis not present

## 2018-07-13 LAB — CBC WITH DIFFERENTIAL/PLATELET
ABS IMMATURE GRANULOCYTES: 0.02 10*3/uL (ref 0.00–0.07)
BASOS ABS: 0 10*3/uL (ref 0.0–0.1)
Basophils Relative: 0 %
Eosinophils Absolute: 0.1 10*3/uL (ref 0.0–0.5)
Eosinophils Relative: 1 %
HEMATOCRIT: 36 % — AB (ref 39.0–52.0)
HEMOGLOBIN: 12.1 g/dL — AB (ref 13.0–17.0)
IMMATURE GRANULOCYTES: 0 %
LYMPHS ABS: 0.8 10*3/uL (ref 0.7–4.0)
LYMPHS PCT: 11 %
MCH: 30.7 pg (ref 26.0–34.0)
MCHC: 33.6 g/dL (ref 30.0–36.0)
MCV: 91.4 fL (ref 80.0–100.0)
MONOS PCT: 10 %
Monocytes Absolute: 0.7 10*3/uL (ref 0.1–1.0)
NEUTROS PCT: 78 %
Neutro Abs: 5.3 10*3/uL (ref 1.7–7.7)
Platelets: 164 10*3/uL (ref 150–400)
RBC: 3.94 MIL/uL — ABNORMAL LOW (ref 4.22–5.81)
RDW: 12.7 % (ref 11.5–15.5)
WBC: 6.9 10*3/uL (ref 4.0–10.5)
nRBC: 0 % (ref 0.0–0.2)

## 2018-07-13 LAB — COMPREHENSIVE METABOLIC PANEL
ALBUMIN: 3.8 g/dL (ref 3.5–5.0)
ALT: 20 U/L (ref 0–44)
ANION GAP: 12 (ref 5–15)
AST: 23 U/L (ref 15–41)
Alkaline Phosphatase: 49 U/L (ref 38–126)
BUN: 25 mg/dL — ABNORMAL HIGH (ref 8–23)
CALCIUM: 8.4 mg/dL — AB (ref 8.9–10.3)
CHLORIDE: 103 mmol/L (ref 98–111)
CO2: 19 mmol/L — AB (ref 22–32)
CREATININE: 1.26 mg/dL — AB (ref 0.61–1.24)
GFR calc Af Amer: >60 mL/min (ref >60)
GFR calc non Af Amer: 52 mL/min — ABNORMAL LOW (ref >60)
GLUCOSE: 130 mg/dL — AB (ref 70–99)
Potassium: 3.8 mmol/L (ref 3.5–5.1)
Sodium: 134 mmol/L — ABNORMAL LOW (ref 135–145)
Total Bilirubin: 0.9 mg/dL (ref 0.3–1.2)
Total Protein: 6.6 g/dL (ref 6.5–8.1)

## 2018-07-13 MED ORDER — PREDNISONE 10 MG PO TABS: 20.0000 mg | ORAL_TABLET | Freq: Every day | ORAL | 0 refills | Status: AC

## 2018-07-13 MED ORDER — DIPHENHYDRAMINE HCL 50 MG/ML IJ SOLN
25.0000 mg | Freq: Once | INTRAMUSCULAR | Status: AC
  Administered 2018-07-13: 25 mg via INTRAVENOUS
  Filled 2018-07-13: qty 1

## 2018-07-13 MED ORDER — SODIUM CHLORIDE 0.9 % IV BOLUS
500.0000 mL | Freq: Once | INTRAVENOUS | Status: AC
  Administered 2018-07-13: 500 mL via INTRAVENOUS

## 2018-07-13 MED ORDER — EPINEPHRINE 0.3 MG/0.3ML IJ SOAJ: 0.3000 mg | Freq: Once | INTRAMUSCULAR | 0 refills | Status: AC

## 2018-07-13 MED ORDER — FAMOTIDINE IN NACL 20-0.9 MG/50ML-% IV SOLN
20.0000 mg | Freq: Once | INTRAVENOUS | Status: AC
  Administered 2018-07-13: 20 mg via INTRAVENOUS
  Filled 2018-07-13: qty 50

## 2018-07-13 MED ORDER — FAMOTIDINE 40 MG PO TABS: 40.0000 mg | ORAL_TABLET | Freq: Every day | ORAL | 0 refills | Status: DC

## 2018-07-13 MED ORDER — METHYLPREDNISOLONE SODIUM SUCC 125 MG IJ SOLR
125.0000 mg | Freq: Once | INTRAMUSCULAR | Status: AC
  Administered 2018-07-13: 125 mg via INTRAVENOUS
  Filled 2018-07-13: qty 2

## 2018-07-13 NOTE — ED Provider Notes (Signed)
Emergency Department Provider Note   I have reviewed the triage vital signs and the nursing notes.   HISTORY  Chief Complaint Allergic Reaction   HPI Noah Scott is a 82 y.o. male with PMH of CAD, HLD, NSTEMI, and HTN presents to the emergency department for evaluation of hand swelling, lip swelling, rash, and itching.  The patient denies any swelling sensation in the tongue or throat.  He is not having any difficulty breathing or swallowing.  The patient states that he had a very similar reaction in 2001 related to taking Plavix.  States he recently had back surgery and was taking Brilinta after DES placement.  He mentions some mild shortness of breath symptoms while on Brilinta and was switched to Plavix 2 weeks ago.  The patient states he did not initially realize that it was Plavix because it was listed as the generic name, clopidogrel.  He last took a dose yesterday but at 2 AM woke up with the swelling in the hands, rash, itching.  He has not taken any medication prior to arrival.  Denies abdominal pain or fevers.  Past Medical History:  Diagnosis Date  . CAD S/P percutaneous coronary angioplasty 2001   a) 2001 ([place]): PCI OM1 - now with ~65% ISR.;; b) NSTEMI 11/2017 - ost-prox RCA 75-65% (Overlapping ONYX DES 3.0 x 26 & 3.0 x 8 --> 3.5-3.3 mm). m-dRCA 95% (ONYX DES 2.75 x 18 mm -> 3.0 mm). Ost OM1 65% ISR (med Rx). dRCA-OstRDA (50%-25%)  . Essential (primary) hypertension 11/21/2017  . Hyperlipidemia 11/21/2017  . Neurogenic claudication due to lumbar spinal stenosis 02/06/2018  . NSTEMI (non-ST elevated myocardial infarction) ([place]) 11/21/2017   Multiple RCA lesions - 2 Overlapping DES Ost-Prox RCA & 1 in mid-distal RCA    Patient Active Problem List   Diagnosis Date Noted  . S/P lumbar laminectomy 06/13/2018  . Preoperative clearance 03/15/2018  . Metabolic syndrome 24/58/0998  . Obesity (BMI 30.0-34.9) 03/15/2018  . Neurogenic claudication due to lumbar spinal stenosis  02/06/2018  . NSTEMI (non-ST elevated myocardial infarction) ([place]) 11/21/2017  . Essential (primary) hypertension 11/21/2017  . Hyperlipidemia with target LDL less than 70 11/21/2017  . CAD S/P percutaneous coronary angioplasty 09/18/1999    Past Surgical History:  Procedure Laterality Date  . CORONARY STENT INTERVENTION N/A 11/25/2017   Procedure: CORONARY STENT INTERVENTION;  Surgeon: Leonie Man, MD;  Location: [facility];  Service: Cardiovascular:   m-dRCA 95%  (DES PCI - 0%.  Resolute ONYX DES 2.75 x 18 mm - ~3.0 mm). Ost-proxRCA 75-65% (DES PCI-0%. 2 overlapping Resolute ONYX DES 3.0 x 26 & 3.0 x 8 mm --> 3.5-3.3 mm)  . CORONARY STENT INTERVENTION  2001   ([place]): PCI OM1  . LEA DOPPLERS  12/12/2017   Normal bilateral ABIs and TBI's.  Marland Kitchen LEFT HEART CATH AND CORONARY ANGIOGRAPHY N/A 11/25/2017   Procedure: LEFT HEART CATH AND CORONARY ANGIOGRAPHY;  Surgeon: Leonie Man, MD;  Location: [facility];  Service: Cardiovascular:  Ost-proxRCA 75&65%(DES PCI), m-dRCA 95% (DES PCI), dRCA 50%-25% ost rPDA. ostOM1 65% ISR (from 2001).   . LUMBAR LAMINECTOMY/DECOMPRESSION MICRODISCECTOMY Bilateral 06/13/2018   Procedure: Laminectomy and Foraminotomy - L1-L2 - L2-L3 - L3-L4 - bilateral;  Surgeon: Eustace Moore, MD;  Location: [facility];  Service: Neurosurgery;  Laterality: Bilateral;  . TRANSTHORACIC ECHOCARDIOGRAM  11/23/2017   Normal LV size and function.  EF 60 to 65%.  No RWMA.  GR 1 DD.  Mild RV dilation.    Allergies Plavix [clopidogrel bisulfate] and Enalapril  Family History  Problem Relation Age of Onset  . Heart disease Father        Pt does not know details  . Stroke Neg Hx   . Diabetes Neg Hx     Social History Social History   Tobacco Use  . Smoking status: Never Smoker  . Smokeless tobacco: Never Used  Substance Use Topics  . Alcohol use: Not Currently  . Drug use: No    Review of Systems  Constitutional: No fever/chills. Positive itching  and swelling in the hands and left lower lip.  Eyes: No visual changes. ENT: No sore throat. Cardiovascular: Denies chest pain.  Respiratory: Denies shortness of breath. Gastrointestinal: No abdominal pain.  No nausea, no vomiting.  No diarrhea.  No constipation. Genitourinary: Negative for dysuria. Musculoskeletal: Negative for back pain. Skin: Positive itchy rash.  Neurological: Negative for headaches, focal weakness or numbness.  10-point ROS otherwise negative.  ____________________________________________   PHYSICAL EXAM:  VITAL SIGNS: ED Triage Vitals  Enc Vitals Group     BP 07/13/18 0644 116/75     Pulse Rate 07/13/18 0644 87     Resp 07/13/18 0644 18     Temp 07/13/18 0644 98.3 F (36.8 C)     Temp Source 07/13/18 0644 Oral     SpO2 07/13/18 0644 95 %     Weight 07/13/18 0646 220 lb (99.8 kg)     Height 07/13/18 0646 5\' 10"  (1.778 m)     Pain Score 07/13/18 0703 0   Constitutional: Alert and oriented. Well appearing and in no acute distress. Eyes: Conjunctivae are normal.  Head: Atraumatic. Nose: No congestion/rhinnorhea. Mouth/Throat: Mucous membranes are moist.  Oropharynx non-erythematous. Swelling to the left lower lip. Oropharynx is widely patent. Tongue is normal. Managing oral secretions and speaking in a clear voice.  Neck: No stridor.  Cardiovascular: Normal rate, regular rhythm. Good peripheral circulation. Grossly normal heart sounds.   Respiratory: Normal respiratory effort.  No retractions. Lungs CTAB. Gastrointestinal: Soft and nontender. No distention.  Musculoskeletal: No lower extremity tenderness nor edema. No gross deformities of extremities. Neurologic:  Normal speech and language. No gross focal neurologic deficits are appreciated.  Skin:  Skin is warm, dry and intact. Mild erythema over the bilateral hands with swelling.   ____________________________________________   LABS (all labs ordered are listed, but only abnormal results are  displayed)  Labs Reviewed  COMPREHENSIVE METABOLIC PANEL - Abnormal; Notable for the following components:      Result Value   Sodium 134 (*)    CO2 19 (*)    Glucose, Bld 130 (*)    BUN 25 (*)    Creatinine, Ser 1.26 (*)    Calcium 8.4 (*)    GFR calc non Af Amer 52 (*)    All other components within normal limits  CBC WITH DIFFERENTIAL/PLATELET - Abnormal; Notable for the following components:   RBC 3.94 (*)    Hemoglobin 12.1 (*)    HCT 36.0 (*)    All other components within normal limits   ____________________________________________  RADIOLOGY  None ____________________________________________   PROCEDURES  Procedure(s) performed:   Procedures  None ____________________________________________   INITIAL IMPRESSION / ASSESSMENT AND PLAN / ED COURSE  Pertinent labs & imaging results that were available during my care of the patient were reviewed by me and considered in my medical decision making (see chart for details).  Patient presents to the  emergency department with likely drug-related rash.  Lower suspicion for DRESS but will obtain screening labs. Patient has drug-eluting stents in place which is why he was taking the Plavix.  I will advised the patient to restart his Brilinta which he does have at home.  Will observe in the emergency department and follow lab work.  Patient given Benadryl, Pepcid, steroid. No acute distress.   Patient observed in the ED for 2 hours. Symptoms improving. Less swelling noted in the hands and lip swelling has decreased. Updated patient's allergy list to include Plavix. Patient has Brilinta at home and will resume this medication today. Will call his Cardiologist tomorrow to discuss the medication change. No evidence of DRESS or other more serious drug reaction.   At this time, I do not feel there is any life-threatening condition present. I have reviewed and discussed all results (EKG, imaging, lab, urine as appropriate), exam  findings with patient. I have reviewed nursing notes and appropriate previous records.  I feel the patient is safe to be discharged home without further emergent workup. Discussed usual and customary return precautions. Patient and family (if present) verbalize understanding and are comfortable with this plan.  Patient will follow-up with their primary care provider. If they do not have a primary care provider, information for follow-up has been provided to them. All questions have been answered.  ____________________________________________  FINAL CLINICAL IMPRESSION(S) / ED DIAGNOSES  Final diagnoses:  Allergic reaction to drug, initial encounter     MEDICATIONS GIVEN DURING THIS VISIT:  Medications  diphenhydrAMINE (BENADRYL) injection 25 mg (25 mg Intravenous Given 07/13/18 0719)  methylPREDNISolone sodium succinate (SOLU-MEDROL) 125 mg/2 mL injection 125 mg (125 mg Intravenous Given 07/13/18 0719)  famotidine (PEPCID) IVPB 20 mg premix (0 mg Intravenous Stopped 07/13/18 0752)  sodium chloride 0.9 % bolus 500 mL (0 mLs Intravenous Stopped 07/13/18 0826)     NEW OUTPATIENT MEDICATIONS STARTED DURING THIS VISIT:  Discharge Medication List as of 07/13/2018  9:06 AM    START taking these medications   Details  EPINEPHrine (EPIPEN 2-PAK) 0.3 mg/0.3 mL IJ SOAJ injection Inject 0.3 mLs (0.3 mg total) into the muscle once for 1 dose., Starting Sun 07/13/2018, Print    famotidine (PEPCID) 40 MG tablet Take 1 tablet (40 mg total) by mouth daily for 10 days., Starting Sun 07/13/2018, Until Wed 07/23/2018, Print    predniSONE (DELTASONE) 10 MG tablet Take 2 tablets (20 mg total) by mouth daily for 3 days., Starting Sun 07/13/2018, Until Wed 07/16/2018, Print        Note:  This document was prepared using Dragon voice recognition software and may include unintentional dictation errors.  Nanda Quinton, MD Emergency Medicine    Long, [facility], MD 07/13/18 [phone]

## 2018-07-13 NOTE — Discharge Instructions (Signed)
You were seen in the ED today with an allergic reaction to Plavix. Do not take this medication any longer. Use the medications prescribed for the itching and swelling. Start taking your Brilinta today can call your Cardiologist. Use the EpiPen with any sudden worsening symptoms such as difficulty breathing or throat swelling.

## 2018-07-13 NOTE — ED Triage Notes (Signed)
Edema to R hand that started around 0130. Pt also presents with edema to L side of mouth, posterior thighs

## 2018-07-17 DIAGNOSIS — E538 Deficiency of other specified B group vitamins: Secondary | ICD-10-CM | POA: Diagnosis not present

## 2018-07-31 DIAGNOSIS — R3915 Urgency of urination: Secondary | ICD-10-CM | POA: Diagnosis not present

## 2018-08-07 ENCOUNTER — Telehealth: Payer: Self-pay | Admitting: Cardiology

## 2018-08-07 NOTE — Telephone Encounter (Signed)
RN REVIEWED  PAST OFFICE NOTES AND  LABS.  INDICATES PRESCRIPTION - POTASSIUM 20 MEQ  DAILY. -LEFT DETAILED MESSAGE ON VOICE MAIL  FOR ROSE.  ANY QUESTION MAY CALL BACK

## 2018-08-07 NOTE — Telephone Encounter (Signed)
New Message   Pt c/o medication issue:  1. Name of Medication: potassium chloride SA (K-DUR,KLOR-CON) 20 MEQ tablet  2. How are you currently taking this medication (dosage and times per day)?   3. Are you having a reaction (difficulty breathing--STAT)?   4. What is your medication issue? Rose at [facility] is calling to confirm how the patient should be taking this medication. Our records show 1 tablet daily but their records 1/2 tablet daily. Please advise

## 2018-08-18 DIAGNOSIS — E538 Deficiency of other specified B group vitamins: Secondary | ICD-10-CM | POA: Diagnosis not present

## 2018-09-08 DIAGNOSIS — I10 Essential (primary) hypertension: Secondary | ICD-10-CM | POA: Diagnosis not present

## 2018-09-08 DIAGNOSIS — I214 Non-ST elevation (NSTEMI) myocardial infarction: Secondary | ICD-10-CM | POA: Diagnosis not present

## 2018-09-08 DIAGNOSIS — E785 Hyperlipidemia, unspecified: Secondary | ICD-10-CM | POA: Diagnosis not present

## 2018-09-08 DIAGNOSIS — N401 Enlarged prostate with lower urinary tract symptoms: Secondary | ICD-10-CM | POA: Diagnosis not present

## 2018-09-08 DIAGNOSIS — I2510 Atherosclerotic heart disease of native coronary artery without angina pectoris: Secondary | ICD-10-CM | POA: Diagnosis not present

## 2018-09-19 DIAGNOSIS — E538 Deficiency of other specified B group vitamins: Secondary | ICD-10-CM | POA: Diagnosis not present

## 2018-09-29 DIAGNOSIS — M48062 Spinal stenosis, lumbar region with neurogenic claudication: Secondary | ICD-10-CM | POA: Diagnosis not present

## 2018-10-07 DIAGNOSIS — R29898 Other symptoms and signs involving the musculoskeletal system: Secondary | ICD-10-CM | POA: Diagnosis not present

## 2018-10-07 DIAGNOSIS — M79605 Pain in left leg: Secondary | ICD-10-CM | POA: Diagnosis not present

## 2018-10-07 DIAGNOSIS — M48062 Spinal stenosis, lumbar region with neurogenic claudication: Secondary | ICD-10-CM | POA: Diagnosis not present

## 2018-10-07 DIAGNOSIS — M79604 Pain in right leg: Secondary | ICD-10-CM | POA: Diagnosis not present

## 2018-10-20 DIAGNOSIS — E785 Hyperlipidemia, unspecified: Secondary | ICD-10-CM | POA: Diagnosis not present

## 2018-10-20 DIAGNOSIS — N401 Enlarged prostate with lower urinary tract symptoms: Secondary | ICD-10-CM | POA: Diagnosis not present

## 2018-10-20 DIAGNOSIS — I10 Essential (primary) hypertension: Secondary | ICD-10-CM | POA: Diagnosis not present

## 2018-10-20 DIAGNOSIS — I2510 Atherosclerotic heart disease of native coronary artery without angina pectoris: Secondary | ICD-10-CM | POA: Diagnosis not present

## 2018-10-20 DIAGNOSIS — I214 Non-ST elevation (NSTEMI) myocardial infarction: Secondary | ICD-10-CM | POA: Diagnosis not present

## 2018-10-21 DIAGNOSIS — E538 Deficiency of other specified B group vitamins: Secondary | ICD-10-CM | POA: Diagnosis not present

## 2018-10-28 DIAGNOSIS — I10 Essential (primary) hypertension: Secondary | ICD-10-CM | POA: Diagnosis not present

## 2018-10-28 DIAGNOSIS — E785 Hyperlipidemia, unspecified: Secondary | ICD-10-CM | POA: Diagnosis not present

## 2018-10-28 DIAGNOSIS — E611 Iron deficiency: Secondary | ICD-10-CM | POA: Diagnosis not present

## 2018-10-28 DIAGNOSIS — K529 Noninfective gastroenteritis and colitis, unspecified: Secondary | ICD-10-CM | POA: Diagnosis not present

## 2018-10-28 DIAGNOSIS — I2510 Atherosclerotic heart disease of native coronary artery without angina pectoris: Secondary | ICD-10-CM | POA: Diagnosis not present

## 2018-11-19 DIAGNOSIS — E538 Deficiency of other specified B group vitamins: Secondary | ICD-10-CM | POA: Diagnosis not present

## 2018-11-26 ENCOUNTER — Other Ambulatory Visit: Payer: Self-pay

## 2018-11-26 ENCOUNTER — Other Ambulatory Visit (HOSPITAL_COMMUNITY): Payer: Self-pay | Admitting: Physician Assistant

## 2018-11-26 ENCOUNTER — Ambulatory Visit (INDEPENDENT_AMBULATORY_CARE_PROVIDER_SITE_OTHER): Payer: Medicare Other | Admitting: Cardiology

## 2018-11-26 ENCOUNTER — Encounter (INDEPENDENT_AMBULATORY_CARE_PROVIDER_SITE_OTHER): Payer: Self-pay

## 2018-11-26 ENCOUNTER — Encounter: Payer: Self-pay | Admitting: Cardiology

## 2018-11-26 VITALS — BP 114/64 | HR 59 | Ht 70.0 in | Wt 225.8 lb

## 2018-11-26 DIAGNOSIS — E785 Hyperlipidemia, unspecified: Secondary | ICD-10-CM

## 2018-11-26 DIAGNOSIS — I214 Non-ST elevation (NSTEMI) myocardial infarction: Secondary | ICD-10-CM | POA: Diagnosis not present

## 2018-11-26 DIAGNOSIS — I10 Essential (primary) hypertension: Secondary | ICD-10-CM | POA: Diagnosis not present

## 2018-11-26 DIAGNOSIS — I2510 Atherosclerotic heart disease of native coronary artery without angina pectoris: Secondary | ICD-10-CM | POA: Diagnosis not present

## 2018-11-26 DIAGNOSIS — R0602 Shortness of breath: Secondary | ICD-10-CM

## 2018-11-26 DIAGNOSIS — E8881 Metabolic syndrome: Secondary | ICD-10-CM

## 2018-11-26 DIAGNOSIS — Z9861 Coronary angioplasty status: Secondary | ICD-10-CM

## 2018-11-26 DIAGNOSIS — E669 Obesity, unspecified: Secondary | ICD-10-CM

## 2018-11-26 DIAGNOSIS — R0600 Dyspnea, unspecified: Secondary | ICD-10-CM | POA: Insufficient documentation

## 2018-11-26 MED ORDER — PRASUGREL HCL 10 MG PO TABS: 10.0000 mg | ORAL_TABLET | Freq: Every day | ORAL | 3 refills | Status: DC

## 2018-11-26 NOTE — Patient Instructions (Addendum)
Medication Instructions:  Stop taking  Brilinta 90 mg- next day Start taking Effient 10 mg one tablet daily   If you need a refill on your cardiac medications before your next appointment, please call your pharmacy.   Lab work: Not needed If you have labs (blood work) drawn today and your tests are completely normal, you will receive your results only by: Marland Kitchen MyChart Message (if you have MyChart) OR . A paper copy in the mail If you have any lab test that is abnormal or we need to change your treatment, we will call you to review the results.  Testing/Procedures: Not needed  Follow-Up: At [facility], you and your health needs are our priority.  As part of our continuing mission to provide you with exceptional heart care, we have created designated Provider Care Teams.  These Care Teams include your primary Cardiologist (physician) and Advanced Practice Providers (APPs -  Physician Assistants and Nurse Practitioners) who all work together to provide you with the care you need, when you need it. You will need a follow up appointment in 4 months [id number].  Please call our office 2 months in advance to schedule this appointment.  You may see Glenetta Hew, MD or one of the following Advanced Practice Providers on your designated Care Team:   Rosaria Ferries, PA-C . Jory Sims, DNP, ANP  Any Other Special Instructions Will Be Listed Below (If Applicable).

## 2018-11-26 NOTE — Progress Notes (Signed)
PCP: Noah Cha, MD  [facility].  Clinic Note: Chief Complaint  Patient presents with   Follow-up   Coronary Artery Disease    Status post PCI.   Shortness of Breath   Leg Pain    HPI: Noah Scott is a 83 y.o. male with a PMH notable for recent NSTEMI & CAD-PCI who presents today for 3 month f/u.  H/o CAD dating back to 2001 -- [place] while living on [place].   He subsequently was seen once by Dr. Marijo Scott in [facility] 06/17/2017 but he did not established there.  NSTEMI 11/21/2017 (He noted that his "ANGINA" equivalent is "severe heartburn radiating to his throat") --> Cath-PCI m-dRCA (DES x 1) & Ost-Prox RCA (2 overlapping DES) -- Difficult, complicated PCI of 3 separate lesions in the RCA requiring use of extra-support buddy wire.; Mod-Severe ISR in OM1 (stent from 2001 - med management pending Back Surgery). He has been having significant back pain with radiculopathy & intermittent ~"psedoclaudication" Sx --> Seen by Dr. Sherley Scott - who has asked about timing for possible spinal procedure in relation to antiplatelet agents.  Had Spinal Sgx in Sept.  Suggested converting from Brilinta to Plavix postop.  Noah Scott was last seen on 05/29/18 for pre-OP Spinal Sgx.  Recent Hospitalizations:   Oct 2019 - ER visit, Allergic response to medication.  Hand, lip swelling, rash & itching. (? To Plavix -- had been converted to Plavix from Brilinta b/c SOB). => was told to restart Brilinta.  Studies Personally Reviewed - (if available, images/films reviewed: From Epic Chart or Care Everywhere)  None  Interval History: Noah Scott returns today noting that he is doing fairly well, but he just has episodes where he gasps for air both at rest and with exertion.  He notes it mostly after walking.  Despite this, he does his right brain exercises and other exercises for spinal stenosis and is doing fine without any chest pain or pressure associated  with it.  None of the chest discomfort (described as a burning heartburn-like symptoms) episodes he was having prior to his cath and PCI.  He still has knee pain left greater than right with some swelling associated with it.  This is, limiting his exercise levels.  He is hoping to have some rehab done on his knee, but may need surgery.  He is still taking as needed pain meds for his back, sometimes to treat his knee pain.  Pretty much because of his lack of ability, he did not follow through with cardiac rehab. He says that he is taking his Lasix maybe 5 days out of the week as opposed to every day.  He tried taking it daily, but it noted quite a bit of urination.  He tries to avoid it when he is gone to be out about doing things.  (Friend since he did not take it today).  He does not really have any PND orthopnea but does have some end of day edema that bothers him some.  Denies any rapid irregular heartbeats, or palpitations.  No syncope/near syncope or TIA/amaurosis fugax. Despite having some breathing issues with Brilinta, he is not having any bleeding issues.  Still notes having some discomfort in his Thighs with Walking, Thought Was Related to His Back or Knees, but Still Present.  ROS: A comprehensive was performed. Review of Systems  Constitutional: Negative for malaise/fatigue (There is now by knee pain more than back pain.) and weight loss (Unfortunately, he  gained back some of the weight after surgeries.).  HENT: Negative for congestion and nosebleeds.   Respiratory: Positive for shortness of breath (See HPI). Negative for cough and wheezing.   Gastrointestinal: Negative for abdominal pain, blood in stool, heartburn and melena.  Genitourinary: Negative for hematuria.  Musculoskeletal: Positive for back pain (Much better postop) and joint pain (Knee pain). Negative for myalgias.  Neurological: Positive for weakness (legs weak due ot neuropathy). Negative for dizziness.        Neuropathic pain in both legs  All other systems reviewed and are negative.   I have reviewed and (if needed) personally updated the patient's problem list, medications, allergies, past medical and surgical history, social and family history.   Past Medical History:  Diagnosis Date   CAD S/P percutaneous coronary angioplasty 2001   a) 2001 ([place]): PCI OM1 - now with ~65% ISR.;; b) NSTEMI 11/2017 - ost-prox RCA 75-65% (Overlapping ONYX DES 3.0 x 26 & 3.0 x 8 --> 3.5-3.3 mm). m-dRCA 95% (ONYX DES 2.75 x 18 mm -> 3.0 mm). Ost OM1 65% ISR (med Rx). dRCA-OstRDA (50%-25%)   Essential (primary) hypertension 11/21/2017   Hyperlipidemia 11/21/2017   Neurogenic claudication due to lumbar spinal stenosis 02/06/2018   NSTEMI (non-ST elevated myocardial infarction) ([place]) 11/21/2017   Multiple RCA lesions - 2 Overlapping DES Ost-Prox RCA & 1 in mid-distal RCA    Past Surgical History:  Procedure Laterality Date   CORONARY STENT INTERVENTION N/A 11/25/2017   Procedure: CORONARY STENT INTERVENTION;  Surgeon: Noah Man, MD;  Location: [facility];  Service: Cardiovascular:   m-dRCA 95%  (DES PCI - 0%.  Resolute ONYX DES 2.75 x 18 mm - ~3.0 mm). Ost-proxRCA 75-65% (DES PCI-0%. 2 overlapping Resolute ONYX DES 3.0 x 26 & 3.0 x 8 mm --> 3.5-3.3 mm)   CORONARY STENT INTERVENTION  2001   ([place]): PCI OM1   LEA DOPPLERS  12/12/2017   Normal bilateral ABIs and TBI's.   LEFT HEART CATH AND CORONARY ANGIOGRAPHY N/A 11/25/2017   Procedure: LEFT HEART CATH AND CORONARY ANGIOGRAPHY;  Surgeon: Noah Man, MD;  Location: [facility];  Service: Cardiovascular:  Ost-proxRCA 75&65%(DES PCI), m-dRCA 95% (DES PCI), dRCA 50%-25% ost rPDA. ostOM1 65% ISR (from 2001).    LUMBAR LAMINECTOMY/DECOMPRESSION MICRODISCECTOMY Bilateral 06/13/2018   Procedure: Laminectomy and Foraminotomy - L1-L2 - L2-L3 - L3-L4 - bilateral;  Surgeon: Noah Moore, MD;  Location: [facility];  Service: Neurosurgery;   Laterality: Bilateral;   TRANSTHORACIC ECHOCARDIOGRAM  11/23/2017   Normal LV size and function.  EF 60 to 65%.  No RWMA.  GR 1 DD.  Mild RV dilation.    Current Meds  Medication Sig   Aromatic Inhalants (VICKS VAPOR INHALER IN) Inhale 1 Inhaler into the lungs as needed (stuffy nose).   aspirin EC 81 MG tablet Take 81 mg by mouth daily.   cyanocobalamin (,VITAMIN B-12,) 1000 MCG/ML injection Inject 1,000 mcg into the muscle every 30 (thirty) days.   doxazosin (CARDURA) 8 MG tablet Take 8 mg by mouth daily.   ezetimibe (ZETIA) 10 MG tablet TAKE 1 TABLET(10 MG) BY MOUTH DAILY   furosemide (LASIX) 20 MG tablet Take 1 tablet (20 mg total) by mouth daily.   losartan (COZAAR) 50 MG tablet Take 1 tablet (50 mg total) by mouth daily.   metoprolol tartrate (LOPRESSOR) 25 MG tablet Take 0.5 tablets (12.5 mg total) by mouth 2 (two) times daily.   Potassium [place] ER  20 MEQ TBCR TK 1/2 T PO QD WF   potassium chloride SA (K-DUR,KLOR-CON) 20 MEQ tablet Take 1 tablet (20 mEq total) by mouth daily.   rosuvastatin (CRESTOR) 40 MG tablet Take 1 tablet (40 mg total) by mouth daily.   [DISCONTINUED] BRILINTA 90 MG TABS tablet     Allergies  Allergen Reactions   Plavix [Clopidogrel Bisulfate] Swelling   Brilinta [Ticagrelor] Other (See Comments)     Developed a cough   Enalapril     Social History   Tobacco Use   Smoking status: Never Smoker   Smokeless tobacco: Never Used  Substance Use Topics   Alcohol use: Not Currently   Drug use: No   Social History   Social History Narrative   Lives alone in an apartment on the first floor.  Has one daughter.  Retired Geophysical data processor.  Education: some college.     family history includes Heart disease in his father. Not sure of details.  Wt Readings from Last 3 Encounters:  11/26/18 225 lb 12.8 oz (102.4 kg)  07/13/18 220 lb (99.8 kg)  06/13/18 227 lb 14.4 oz (103.4 kg)    PHYSICAL EXAM BP 114/64    Pulse (!) 59     Ht 5\' 10"  (1.778 m)    Wt 225 lb 12.8 oz (102.4 kg)    SpO2 98%    BMI 32.40 kg/m  Physical Exam  Constitutional: He is oriented to person, place, and time. He appears well-developed and well-nourished. No distress.  Healthy-appearing.  Well-groomed  HENT:  Head: Normocephalic and atraumatic.  Neck: Normal range of motion. No hepatojugular reflux and no JVD present. Carotid bruit is not present. No thyromegaly present.  Cardiovascular: Normal rate, regular rhythm, normal heart sounds and intact distal pulses.  No extrasystoles are present. PMI is not displaced. Exam reveals no gallop and no friction rub.  No murmur heard. Pulmonary/Chest: Effort normal and breath sounds normal. No respiratory distress. He has no wheezes. He has no rales.  Abdominal: Soft. Bowel sounds are normal. He exhibits no distension. There is no abdominal tenderness. There is no rebound.  No HSM  Musculoskeletal:        General: No edema.     Comments: stiff, slow gait.  Limited by pain  Neurological: He is alert and oriented to person, place, and time.  Psychiatric: He has a normal mood and affect. His behavior is normal. Judgment and thought content normal.  Seems to be high strung.  Standing up for a good portion of the exam.  Vitals reviewed.   Adult ECG Report Sinus bradycardia, rate 59 bpm.  1 degree AV block.  Otherwise normal axis, intervals and durations. -Stable, relatively normal EKG  Other studies Reviewed: Additional studies/ records that were reviewed today include:  Recent Labs: No new labs since Jan 17, 2018  TC 87, TG 97, HDL 35, LDL 33.  BUN/Cr 20/1.2.  TSH 2.5.  A1c 6.2, glucose 109.   ASSESSMENT / PLAN: Problem List Items Addressed This Visit    CAD S/P percutaneous coronary angioplasty - Primary (Chronic)    Multiple stents at the time of his MI in March 2019.  1 year out now.  He is on aspirin and Brilinta as well as statin and beta-blocker plus ARB.  Concerned about his dyspnea  episodes.  Will convert from Brilinta to Effient.  Make sure we stop aspirin.  Maintain on Effient alone for 1 more year and then potentially reduce to baby aspirin plus  low-dose Xarelto.  Okay to hold for procedures 5 to 7 days  Reassess symptoms in about a month.  If doing okay with no further dyspnea episodes, no need for follow-up.  But otherwise would want to potentially consider ischemic evaluation.      Relevant Orders   EKG 12-Lead (Completed)   Dyspnea    Gasping for air symptoms sounds very consistent with Brilinta related dyspnea.  We will give a trial run of stopping Brilinta and converting to Effient since he is noted to have an allergic reaction to Plavix.   Okay to hold Effient for procedures 5-7 days preop.      Relevant Orders   EKG 12-Lead (Completed)   Essential (primary) hypertension (Chronic)    Blood pressures well controlled on moderate dose losartan and low-dose Lopressor.      Hyperlipidemia with target LDL less than 70 (Chronic)    Last LDL in May 2019 was 33.  Well-controlled on current dose of statin.  He will be due for follow-up labs in May of this year -has been followed by PCP.Marland Kitchen      Metabolic syndrome (Chronic)    Discussed importance of continuing to stay active with exercise.  Blood pressures well controlled as are her lipids.      NSTEMI (non-ST elevated myocardial infarction) (HCC) (Chronic)    Just about 1 year out from his MI.  Had several stents placed at that time.  Really only residually having some mild diastolic dysfunction related edema.  On low-dose Lasix.  Otherwise on beta-blocker, ARB, statin as well as antiplatelet agents as noted.  Had preserved EF with no wall motion normality on echo.  Otherwise doing well.      Obesity (BMI 30.0-34.9) (Chronic)      I spent a total of 25 minutes with the patient and chart review. >  50% of the time was spent in direct patient consultation.   Current medicines are reviewed at length  with the patient today.  (+/- concerns) Pre-op questions The following changes have been made:  see below  Patient Instructions  Medication Instructions:  Stop taking  Brilinta 90 mg- next day Start taking Effient 10 mg one tablet daily   If you need a refill on your cardiac medications before your next appointment, please call your pharmacy.   Lab work: Not needed If you have labs (blood work) drawn today and your tests are completely normal, you will receive your results only by:  [place] (if you have MyChart) OR  A paper copy in the mail If you have any lab test that is abnormal or we need to change your treatment, we will call you to review the results.  Testing/Procedures: Not needed  Follow-Up: At [facility], you and your health needs are our priority.  As part of our continuing mission to provide you with exceptional heart care, we have created designated Provider Care Teams.  These Care Teams include your primary Cardiologist (physician) and Advanced Practice Providers (APPs -  Physician Assistants and Nurse Practitioners) who all work together to provide you with the care you need, when you need it. You will need a follow up appointment in 4 months [id number].  Please call our office 2 months in advance to schedule this appointment.  You may see Glenetta Hew, MD or one of the following Advanced Practice Providers on your designated Care Team:   Rosaria Ferries, PA-C  Jory Sims, DNP, ANP  Any Other Special Instructions Will Be  Listed Below (If Applicable).      Studies Ordered:   Orders Placed This Encounter  Procedures   EKG 12-Lead      Glenetta Hew, M.D., M.S. Interventional Cardiologist   Pager # [phone] Phone # [phone] [address] [place], [place] [address]   Thank you for choosing Heartcare at [facility]!!

## 2018-11-29 ENCOUNTER — Encounter: Payer: Self-pay | Admitting: Cardiology

## 2018-11-29 NOTE — Assessment & Plan Note (Addendum)
Gasping for air symptoms sounds very consistent with Brilinta related dyspnea.  We will give a trial run of stopping Brilinta and converting to Effient since he is noted to have an allergic reaction to Plavix.   Okay to hold Effient for procedures 5-7 days preop.

## 2018-11-29 NOTE — Assessment & Plan Note (Addendum)
Last LDL in May 2019 was 33.  Well-controlled on current dose of statin.  He will be due for follow-up labs in May of this year -has been followed by PCP.Marland Kitchen

## 2018-11-29 NOTE — Assessment & Plan Note (Addendum)
Multiple stents at the time of his MI in March 2019.  1 year out now.  He is on aspirin and Brilinta as well as statin and beta-blocker plus ARB.  Concerned about his dyspnea episodes.  Will convert from Brilinta to Effient.  Make sure we stop aspirin.  Maintain on Effient alone for 1 more year and then potentially reduce to baby aspirin plus low-dose Xarelto.  Okay to hold for procedures 5 to 7 days  Reassess symptoms in about a month.  If doing okay with no further dyspnea episodes, no need for follow-up.  But otherwise would want to potentially consider ischemic evaluation.

## 2018-11-29 NOTE — Assessment & Plan Note (Signed)
Discussed importance of continuing to stay active with exercise.  Blood pressures well controlled as are her lipids.

## 2018-11-29 NOTE — Assessment & Plan Note (Signed)
Just about 1 year out from his MI.  Had several stents placed at that time.  Really only residually having some mild diastolic dysfunction related edema.  On low-dose Lasix.  Otherwise on beta-blocker, ARB, statin as well as antiplatelet agents as noted.  Had preserved EF with no wall motion normality on echo.  Otherwise doing well.

## 2018-11-29 NOTE — Assessment & Plan Note (Addendum)
Blood pressures well controlled on moderate dose losartan and low-dose Lopressor.

## 2018-12-22 DIAGNOSIS — E538 Deficiency of other specified B group vitamins: Secondary | ICD-10-CM | POA: Diagnosis not present

## 2019-01-23 DIAGNOSIS — E538 Deficiency of other specified B group vitamins: Secondary | ICD-10-CM | POA: Diagnosis not present

## 2019-02-17 DIAGNOSIS — M48062 Spinal stenosis, lumbar region with neurogenic claudication: Secondary | ICD-10-CM | POA: Diagnosis not present

## 2019-02-23 DIAGNOSIS — E538 Deficiency of other specified B group vitamins: Secondary | ICD-10-CM | POA: Diagnosis not present

## 2019-03-03 ENCOUNTER — Emergency Department (HOSPITAL_BASED_OUTPATIENT_CLINIC_OR_DEPARTMENT_OTHER)
Admission: EM | Admit: 2019-03-03 | Discharge: 2019-03-03 | Disposition: A | Discharge status: Home / Self Care | Payer: Medicare Other | Attending: Emergency Medicine | Admitting: Emergency Medicine

## 2019-03-03 ENCOUNTER — Encounter (HOSPITAL_BASED_OUTPATIENT_CLINIC_OR_DEPARTMENT_OTHER): Payer: Self-pay | Admitting: Emergency Medicine

## 2019-03-03 ENCOUNTER — Other Ambulatory Visit: Payer: Self-pay

## 2019-03-03 DIAGNOSIS — H00015 Hordeolum externum left lower eyelid: Secondary | ICD-10-CM | POA: Diagnosis not present

## 2019-03-03 DIAGNOSIS — I10 Essential (primary) hypertension: Secondary | ICD-10-CM | POA: Diagnosis not present

## 2019-03-03 DIAGNOSIS — Z955 Presence of coronary angioplasty implant and graft: Secondary | ICD-10-CM | POA: Insufficient documentation

## 2019-03-03 DIAGNOSIS — I2510 Atherosclerotic heart disease of native coronary artery without angina pectoris: Secondary | ICD-10-CM | POA: Diagnosis not present

## 2019-03-03 DIAGNOSIS — M48062 Spinal stenosis, lumbar region with neurogenic claudication: Secondary | ICD-10-CM | POA: Diagnosis not present

## 2019-03-03 DIAGNOSIS — Z87891 Personal history of nicotine dependence: Secondary | ICD-10-CM | POA: Insufficient documentation

## 2019-03-03 DIAGNOSIS — H5712 Ocular pain, left eye: Secondary | ICD-10-CM | POA: Insufficient documentation

## 2019-03-03 DIAGNOSIS — I252 Old myocardial infarction: Secondary | ICD-10-CM | POA: Diagnosis not present

## 2019-03-03 NOTE — Discharge Instructions (Addendum)
You were evaluated in the Emergency Department and after careful evaluation, we did not find any emergent condition requiring admission or further testing in the hospital.  Your symptoms today seem to be due to a stye of the left lower eyelid.  We recommend warm compresses often throughout the day as well as the application of baby oil 1-2 times a day to help the eyelid heal.  If your symptoms are not improved in the next 3 to 5 days, we recommend follow-up with an ophthalmologist.  If the redness and pain spreads to your face, we recommend return to the emergency department.  Please return to the Emergency Department if you experience any worsening of your condition.  We encourage you to follow up with a primary care provider.  Thank you for allowing [place] to be a part of your care.

## 2019-03-03 NOTE — ED Triage Notes (Signed)
Pain and tenderness to lower lid of left eye since yesterday.  Area has slight swelling and redness.

## 2019-03-03 NOTE — ED Provider Notes (Signed)
[facility] Emergency Department Provider Note MRN:  [id number]  Arrival date & time: 03/03/19     Chief Complaint   Eye Pain   History of Present Illness   Noah Scott is a 83 y.o. year-old male with a history of CAD presenting to the ED with chief complaint of eye pain.  Tenderness and some redness to the lower left eyelid since yesterday morning, worse with palpation.  Denies vision change, no pain or irritation to the surface of the eye, denies trauma, no recent metal work, no other complaints.  Denies fever, no chest pain or shortness of breath, no abdominal pain.  Review of Systems  A complete 10 system review of systems was obtained and all systems are negative except as noted in the HPI and PMH.   Patient's Health History    Past Medical History:  Diagnosis Date  . CAD S/P percutaneous coronary angioplasty 2001   a) 2001 ([place]): PCI OM1 - now with ~65% ISR.;; b) NSTEMI 11/2017 - ost-prox RCA 75-65% (Overlapping ONYX DES 3.0 x 26 & 3.0 x 8 --> 3.5-3.3 mm). m-dRCA 95% (ONYX DES 2.75 x 18 mm -> 3.0 mm). Ost OM1 65% ISR (med Rx). dRCA-OstRDA (50%-25%)  . Essential (primary) hypertension 11/21/2017  . Hyperlipidemia 11/21/2017  . Neurogenic claudication due to lumbar spinal stenosis 02/06/2018  . NSTEMI (non-ST elevated myocardial infarction) ([place]) 11/21/2017   Multiple RCA lesions - 2 Overlapping DES Ost-Prox RCA & 1 in mid-distal RCA    Past Surgical History:  Procedure Laterality Date  . CORONARY STENT INTERVENTION N/A 11/25/2017   Procedure: CORONARY STENT INTERVENTION;  Surgeon: Leonie Man, MD;  Location: [facility];  Service: Cardiovascular:   m-dRCA 95%  (DES PCI - 0%.  Resolute ONYX DES 2.75 x 18 mm - ~3.0 mm). Ost-proxRCA 75-65% (DES PCI-0%. 2 overlapping Resolute ONYX DES 3.0 x 26 & 3.0 x 8 mm --> 3.5-3.3 mm)  . CORONARY STENT INTERVENTION  2001   ([place]): PCI OM1  . LEA DOPPLERS  12/12/2017   Normal bilateral ABIs and  TBI's.  Marland Kitchen LEFT HEART CATH AND CORONARY ANGIOGRAPHY N/A 11/25/2017   Procedure: LEFT HEART CATH AND CORONARY ANGIOGRAPHY;  Surgeon: Leonie Man, MD;  Location: [facility];  Service: Cardiovascular:  Ost-proxRCA 75&65%(DES PCI), m-dRCA 95% (DES PCI), dRCA 50%-25% ost rPDA. ostOM1 65% ISR (from 2001).   . LUMBAR LAMINECTOMY/DECOMPRESSION MICRODISCECTOMY Bilateral 06/13/2018   Procedure: Laminectomy and Foraminotomy - L1-L2 - L2-L3 - L3-L4 - bilateral;  Surgeon: Eustace Moore, MD;  Location: [facility];  Service: Neurosurgery;  Laterality: Bilateral;  . TRANSTHORACIC ECHOCARDIOGRAM  11/23/2017   Normal LV size and function.  EF 60 to 65%.  No RWMA.  GR 1 DD.  Mild RV dilation.    Family History  Problem Relation Age of Onset  . Heart disease Father        Pt does not know details  . Stroke Neg Hx   . Diabetes Neg Hx     Social History   Socioeconomic History  . Marital status: Widowed    Spouse name: Not on file  . Number of children: 1  . Years of education: 38  . Highest education level: Not on file  Occupational History  . Occupation: retired Geophysical data processor  Social Needs  . Financial resource strain: Not on file  . Food insecurity    Worry: Not on file    Inability: Not on file  .  Transportation needs    Medical: Not on file    Non-medical: Not on file  Tobacco Use  . Smoking status: Former Smoker    Quit date: 03/02/1969    Years since quitting: 50.0  . Smokeless tobacco: Never Used  Substance and Sexual Activity  . Alcohol use: Not Currently  . Drug use: No  . Sexual activity: Not on file  Lifestyle  . Physical activity    Days per week: Not on file    Minutes per session: Not on file  . Stress: Not on file  Relationships  . Social Herbalist on phone: Not on file    Gets together: Not on file    Attends religious service: Not on file    Active member of club or organization: Not on file    Attends meetings of clubs or organizations: Not  on file    Relationship status: Not on file  . Intimate partner violence    Fear of current or ex partner: Not on file    Emotionally abused: Not on file    Physically abused: Not on file    Forced sexual activity: Not on file  Other Topics Concern  . Not on file  Social History Narrative   Lives alone in an apartment on the first floor.  Has one daughter.  Retired Geophysical data processor.  Education: some college.      Physical Exam  Vital Signs and Nursing Notes reviewed Vitals:   03/03/19 1107  BP: (!) 157/85  Pulse: (!) 58  Temp: 98.4 F (36.9 C)  SpO2: 99%    CONSTITUTIONAL: Well-appearing, NAD NEURO:  Alert and oriented x 3, no focal deficits EYES:  eyes equal and reactive, normal extraocular movements, erythema and tenderness to the central aspect of the left lower lid ENT/NECK:  no LAD, no JVD CARDIO: Regular rate, well-perfused, normal S1 and S2 PULM:  CTAB no wheezing or rhonchi GI/GU:  normal bowel sounds, non-distended, non-tender MSK/SPINE:  No gross deformities, no edema SKIN:  no rash, atraumatic PSYCH:  Appropriate speech and behavior  Diagnostic and Interventional Summary    EKG Interpretation  Date/Time:    Ventricular Rate:    PR Interval:    QRS Duration:   QT Interval:    QTC Calculation:   R Axis:     Text Interpretation:        Labs Reviewed - No data to display  No orders to display    Medications - No data to display   Procedures Critical Care  ED Course and Medical Decision Making  I have reviewed the triage vital signs and the nursing notes.  Pertinent labs & imaging results that were available during my care of the patient were reviewed by me and considered in my medical decision making (see below for details).  Suspect stye versus chalazion with surrounding inflammation in this 83 year old male, no vision loss, no trauma.  Advised warm compresses, ophthalmology follow-up if not improved in a few days.  Return precautions for  signs of facial or orbital cellulitis, which is currently not present.  After the discussed management above, the patient was determined to be safe for discharge.  The patient was in agreement with this plan and all questions regarding their care were answered.  ED return precautions were discussed and the patient will return to the ED with any significant worsening of condition.  Barth Kirks. Sedonia Small, [place] [email or web address]  Final Clinical Impressions(s) / ED Diagnoses     ICD-10-CM   1. Pain of left eye  H57.12   2. Hordeolum externum of left lower eyelid  H00.015     ED Discharge Orders    None         Maudie Flakes, MD 03/03/19 1222

## 2019-03-05 DIAGNOSIS — M4805 Spinal stenosis, thoracolumbar region: Secondary | ICD-10-CM | POA: Diagnosis not present

## 2019-03-05 DIAGNOSIS — M48062 Spinal stenosis, lumbar region with neurogenic claudication: Secondary | ICD-10-CM | POA: Diagnosis not present

## 2019-03-05 DIAGNOSIS — M79604 Pain in right leg: Secondary | ICD-10-CM | POA: Diagnosis not present

## 2019-03-06 DIAGNOSIS — H00015 Hordeolum externum left lower eyelid: Secondary | ICD-10-CM | POA: Diagnosis not present

## 2019-03-22 ENCOUNTER — Other Ambulatory Visit: Payer: Self-pay | Admitting: Cardiology

## 2019-03-26 DIAGNOSIS — E538 Deficiency of other specified B group vitamins: Secondary | ICD-10-CM | POA: Diagnosis not present

## 2019-03-27 DIAGNOSIS — H2513 Age-related nuclear cataract, bilateral: Secondary | ICD-10-CM | POA: Diagnosis not present

## 2019-04-21 DIAGNOSIS — M79604 Pain in right leg: Secondary | ICD-10-CM | POA: Diagnosis not present

## 2019-04-28 DIAGNOSIS — E538 Deficiency of other specified B group vitamins: Secondary | ICD-10-CM | POA: Diagnosis not present

## 2019-05-29 ENCOUNTER — Encounter: Payer: Self-pay | Admitting: Cardiology

## 2019-05-29 ENCOUNTER — Other Ambulatory Visit: Payer: Self-pay

## 2019-05-29 ENCOUNTER — Ambulatory Visit (INDEPENDENT_AMBULATORY_CARE_PROVIDER_SITE_OTHER): Payer: Medicare Other | Admitting: Cardiology

## 2019-05-29 VITALS — BP 118/72 | HR 63 | Temp 96.4°F | Ht 70.5 in | Wt 219.4 lb

## 2019-05-29 DIAGNOSIS — I10 Essential (primary) hypertension: Secondary | ICD-10-CM

## 2019-05-29 DIAGNOSIS — I2510 Atherosclerotic heart disease of native coronary artery without angina pectoris: Secondary | ICD-10-CM

## 2019-05-29 DIAGNOSIS — E785 Hyperlipidemia, unspecified: Secondary | ICD-10-CM

## 2019-05-29 DIAGNOSIS — E669 Obesity, unspecified: Secondary | ICD-10-CM | POA: Diagnosis not present

## 2019-05-29 DIAGNOSIS — Z9861 Coronary angioplasty status: Secondary | ICD-10-CM | POA: Diagnosis not present

## 2019-05-29 NOTE — Progress Notes (Signed)
PCP: Leeroy Cha, MD  [place].  Clinic Note: Chief Complaint  Patient presents with   Follow-up   Coronary Artery Disease    HPI: Noah Scott is a 83 y.o. male with a PMH notable for NSTEMI & CAD-PCI who presents today for ~6 month f/u.  H/o CAD dating back to 2001 -- [place] while living on [place].   He subsequently was seen once by Dr. Marijo File in [facility] 06/17/2017 but he did not established there.  NSTEMI 11/21/2017 (He noted that his "ANGINA" equivalent is "severe heartburn radiating to his throat") --> Cath-PCI m-dRCA (DES x 1) & Ost-Prox RCA (2 overlapping DES) -- Difficult, complicated PCI of 3 separate lesions in the RCA requiring use of extra-support buddy wire.; Mod-Severe ISR in OM1 (stent from 2001 - med management pending Back Surgery). He has been having significant back pain with radiculopathy & intermittent ~"psedoclaudication" Sx --> Seen by Dr. Sherley Bounds - who has asked about timing for possible spinal procedure in relation to antiplatelet agents.  Had Spinal Sgx in Sept.  Suggested converting from Brilinta to Plavix postop.  Noah Scott was last seen in 11/2018 - doing well ; converted Brilinta to Effient.  Recent Hospitalizations:   n/a  Studies Personally Reviewed - (if available, images/films reviewed: From Epic Chart or Care Everywhere)  n/a  Interval History: Noah Scott returns today overall doing fairly well.  The biggest complaint he has is that his back is so stiff.  He is really regretting doing surgery.  He is now doing pain management therapy.  He has a lot of radicular pain down the leg.  As a result of all his back issues, he is decided to forego plans for knee surgery.  As a result of his knee hurting and his back limiting him, he really is unable to do much in the way of activity.  He likes to do water walking.  He notes he is limited as far as exercise goes, but he does have a new condo which she has 3  stories and he is able to go up and down the steps maybe 2 or 3 times a day relatively well without any complaints of any chest pain or pressure.  He is really limited by his back pain.  He is hoping to be able take Aleve for his pain, but is reluctant to do that because of being on aspirin and Effient.  Seems to be overall tolerating the Effient much better no need to Plavix with a limp. Also he is not having any symptoms to speak of of chest pain pressure with rest exertion.  No symptoms of heart failure symptoms PND orthopnea which is mild edema. No rapid irregular heartbeats palpitations.  Just a few flutters she has been there.  No syncope/near syncope or TIA supine versus fugax.  He has had some lightheaded spells when his back pain kicks in, but otherwise no real syncope type symptoms..   Hardly tell if he has claudication, because he is having much more radicular type pain.  ROS: A comprehensive was performed. Review of Systems  Constitutional: Negative for malaise/fatigue (Really limited by back and knee pain) and weight loss (Unfortunately, he gained back some of the weight after surgeries.).  HENT: Negative for congestion and nosebleeds.   Respiratory: Positive for shortness of breath (See HPI). Negative for cough and wheezing.   Gastrointestinal: Negative for abdominal pain, blood in stool, heartburn and melena.  Genitourinary: Negative for hematuria.  Musculoskeletal: Positive for back pain (Much better postop) and joint pain (Knee pain). Negative for falls and myalgias.  Neurological: Positive for tingling (Bilateral leg radicular pain from his back) and weakness (legs weak due ot neuropathy). Negative for dizziness.       Neuropathic pain in both legs  Psychiatric/Behavioral: Negative for depression and memory loss. The patient has insomnia (Not sleeping well because of back discomfort stiffness.). The patient is not nervous/anxious.   All other systems reviewed and are  negative.  The patient does not have symptoms concerning for COVID-19 infection (fever, chills, cough, or new shortness of breath).  The patient is practicing social distancing.      COVID-19 Education: The signs and symptoms of COVID-19 were discussed with the patient and how to seek care for testing (follow up with PCP or arrange E-visit).   The importance of social distancing was discussed today.   I have reviewed and (if needed) personally updated the patient's problem list, medications, allergies, past medical and surgical history, social and family history.   Past Medical History:  Diagnosis Date   CAD S/P percutaneous coronary angioplasty 2001   a) 2001 ([place]): PCI OM1 - now with ~65% ISR.;; b) NSTEMI 11/2017 - ost-prox RCA 75-65% (Overlapping ONYX DES 3.0 x 26 & 3.0 x 8 --> 3.5-3.3 mm). m-dRCA 95% (ONYX DES 2.75 x 18 mm -> 3.0 mm). Ost OM1 65% ISR (med Rx). dRCA-OstRDA (50%-25%)   Essential (primary) hypertension 11/21/2017   Hyperlipidemia 11/21/2017   Neurogenic claudication due to lumbar spinal stenosis 02/06/2018   NSTEMI (non-ST elevated myocardial infarction) ([place]) 11/21/2017   Multiple RCA lesions - 2 Overlapping DES Ost-Prox RCA & 1 in mid-distal RCA    Past Surgical History:  Procedure Laterality Date   CORONARY STENT INTERVENTION N/A 11/25/2017   Procedure: CORONARY STENT INTERVENTION;  Surgeon: Leonie Man, MD;  Location: [facility];  Service: Cardiovascular:   m-dRCA 95%  (DES PCI - 0%.  Resolute ONYX DES 2.75 x 18 mm - ~3.0 mm). Ost-proxRCA 75-65% (DES PCI-0%. 2 overlapping Resolute ONYX DES 3.0 x 26 & 3.0 x 8 mm --> 3.5-3.3 mm)   CORONARY STENT INTERVENTION  2001   ([place]): PCI OM1   LEA DOPPLERS  12/12/2017   Normal bilateral ABIs and TBI's.   LEFT HEART CATH AND CORONARY ANGIOGRAPHY N/A 11/25/2017   Procedure: LEFT HEART CATH AND CORONARY ANGIOGRAPHY;  Surgeon: Leonie Man, MD;  Location: [facility];  Service:  Cardiovascular:  Ost-proxRCA 75&65%(DES PCI), m-dRCA 95% (DES PCI), dRCA 50%-25% ost rPDA. ostOM1 65% ISR (from 2001).    LUMBAR LAMINECTOMY/DECOMPRESSION MICRODISCECTOMY Bilateral 06/13/2018   Procedure: Laminectomy and Foraminotomy - L1-L2 - L2-L3 - L3-L4 - bilateral;  Surgeon: Eustace Moore, MD;  Location: [facility];  Service: Neurosurgery;  Laterality: Bilateral;   TRANSTHORACIC ECHOCARDIOGRAM  11/23/2017   Normal LV size and function.  EF 60 to 65%.  No RWMA.  GR 1 DD.  Mild RV dilation.    Current Meds  Medication Sig   Aromatic Inhalants (VICKS VAPOR INHALER IN) Inhale 1 Inhaler into the lungs as needed (stuffy nose).   cyanocobalamin (,VITAMIN B-12,) 1000 MCG/ML injection Inject 1,000 mcg into the muscle every 30 (thirty) days.   doxazosin (CARDURA) 8 MG tablet Take 8 mg by mouth daily.   ezetimibe (ZETIA) 10 MG tablet TAKE 1 TABLET(10 MG) BY MOUTH DAILY   furosemide (LASIX) 20 MG tablet TAKE 1 TABLET(20 MG) BY MOUTH DAILY  losartan (COZAAR) 50 MG tablet Take 1 tablet (50 mg total) by mouth daily.   metoprolol tartrate (LOPRESSOR) 25 MG tablet Take 0.5 tablets (12.5 mg total) by mouth 2 (two) times daily.   nitroGLYCERIN (NITROSTAT) 0.4 MG SL tablet Place 1 tablet (0.4 mg total) under the tongue every 5 (five) minutes as needed for chest pain.   Potassium Chloride ER 20 MEQ TBCR TK 1/2 T PO QD WF   potassium chloride SA (K-DUR,KLOR-CON) 20 MEQ tablet Take 1 tablet (20 mEq total) by mouth daily.   prasugrel (EFFIENT) 10 MG TABS tablet Take 1 tablet (10 mg total) by mouth daily.   rosuvastatin (CRESTOR) 40 MG tablet Take 1 tablet (40 mg total) by mouth daily.   [DISCONTINUED] aspirin EC 81 MG tablet Take 81 mg by mouth daily.    Allergies  Allergen Reactions   Plavix [Clopidogrel Bisulfate] Swelling   Brilinta [Ticagrelor] Other (See Comments)     Developed a cough   Enalapril     Social History   Tobacco Use   Smoking status: Former Smoker    Quit date:  03/02/1969    Years since quitting: 50.2   Smokeless tobacco: Never Used  Substance Use Topics   Alcohol use: Not Currently   Drug use: No   Social History   Social History Narrative   Lives alone in an apartment on the first floor.  Has one daughter.  Retired Geophysical data processor.  Education: some college.     family history includes Heart disease in his father. Not sure of details.  Wt Readings from Last 3 Encounters:  05/29/19 219 lb 6.4 oz (99.5 kg)  03/03/19 215 lb (97.5 kg)  11/26/18 225 lb 12.8 oz (102.4 kg)    PHYSICAL EXAM BP 118/72    Pulse 63    Temp (!) 96.4 F (35.8 C)    Ht 5' 10.5" (1.791 m)    Wt 219 lb 6.4 oz (99.5 kg)    SpO2 98%    BMI 31.04 kg/m  Physical Exam  Constitutional: He is oriented to person, place, and time. He appears well-developed and well-nourished. No distress.  Healthy-appearing.  Well-groomed  HENT:  Head: Normocephalic and atraumatic.  Neck: Normal range of motion. No hepatojugular reflux and no JVD present. Carotid bruit is not present. No thyromegaly present.  Just a little stiff, from stiff muscles, but not painful  Cardiovascular: Normal rate, regular rhythm, normal heart sounds and intact distal pulses.  No extrasystoles are present. PMI is not displaced. Exam reveals no gallop and no friction rub.  No murmur heard. Pulmonary/Chest: Effort normal and breath sounds normal. No respiratory distress. He has no wheezes. He has no rales.  Abdominal: Soft. Bowel sounds are normal. He exhibits no distension. There is no abdominal tenderness. There is no rebound.  No HSM  Musculoskeletal:        General: No edema.     Comments: Stiff, slow-antalgic gait.  Neurological: He is alert and oriented to person, place, and time.  Psychiatric: He has a normal mood and affect. His behavior is normal. Judgment and thought content normal.  Seems to be high strung.  Standing up for a good portion of the exam.  Vitals reviewed.   Adult ECG  Report Sinus bradycardia, rate 59 bpm.  1 degree AV block.  Otherwise normal axis, intervals and durations. -Stable, relatively normal EKG  Other studies Reviewed: Additional studies/ records that were reviewed today include:  Recent Labs: No new labs since  Jan 17, 2018  TC 87, TG 97, HDL 35, LDL 33.  BUN/Cr 20/1.2.  TSH 2.5.  A1c 6.2, glucose 109.  Due for labs   ASSESSMENT / PLAN: Problem List Items Addressed This Visit    CAD S/P percutaneous coronary angioplasty - Primary (Chronic)    No anginal symptoms since his last PCI.  He is on a beta-blocker and ARB along with statin.  Now on Effient, tolerating it well along with aspirin.  Beyond 1 year out, so we can stop aspirin.  He just refilled a bottle of aspirin so when this bottle is complete, he will stop taking aspirin.  This will free him up to take Aleve for his back pain.      Relevant Orders   Lipid panel   Comprehensive metabolic panel   Essential (primary) hypertension (Chronic)    Well-controlled on current dose of beta-blocker and ARB.  Only using PRN Lasix      Hyperlipidemia with target LDL less than 70 (Chronic)    He is due for labs to be checked now, but as of last year his lipids were well controlled on current dose of statin. Continue current dose of rosuvastatin.  Check lipid panel within the next few weeks.  If lipids continue to be as well controlled, we can probably back off on rosuvastatin.  Maybe this will help some of the stiffness.      Relevant Orders   Lipid panel   Comprehensive metabolic panel   Obesity (BMI 30.0-34.9) (Chronic)    Slow and steady weight loss since March.  This is despite his back and knee pain issues.  Continue to encourage exercise and dietary modification.         I spent a total of 26 minutes with the patient and chart review. >  50% of the time was spent in direct patient consultation.   Current medicines are reviewed at length with the patient today.  (+/-  concerns) --can you take Aleve The following changes have been made:  Okay to take Aleve once not taking aspirin  Patient Instructions  Medication Instructions:   - COMPLETE THE CURRENT BOTTLE OF ASPIRIN , THEN YOU MAY STOP TAKING THEM ALL TOGEGHTER  If you need a refill on your cardiac medications before your next appointment, please call your pharmacy.   Lab work: LIPID CMP- FASTING- THE DAY OF TEST  If you have labs (blood work) drawn today and your tests are completely normal, you will receive your results only by:  MyChart Message (if you have MyChart) OR  A paper copy in the mail If you have any lab test that is abnormal or we need to change your treatment, we will call you to review the results.  Testing/Procedures: NOT NEEDED  Follow-Up: At [facility], you and your health needs are our priority.  As part of our continuing mission to provide you with exceptional heart care, we have created designated Provider Care Teams.  These Care Teams include your primary Cardiologist (physician) and Advanced Practice Providers (APPs -  Physician Assistants and Nurse Practitioners) who all work together to provide you with the care you need, when you need it.  You will need a follow up appointment in 6  Months- MARCH 2021.  Please call our office 2 months in advance to schedule this appointment.  You may see Glenetta Hew, MD or one of the following Advanced Practice Providers on your designated Care Team:    Rosaria Ferries, PA-C  Jory Sims, DNP, ANP  Any Other Special Instructions Will Be Listed Below (If Applicable).  Studies Ordered:   Orders Placed This Encounter  Procedures   Lipid panel   Comprehensive metabolic panel      Glenetta Hew, M.D., M.S. Interventional Cardiologist   Pager # [phone] Phone # [phone] [address] [place], [place] [address]   Thank you for choosing Heartcare at [facility]!!

## 2019-05-29 NOTE — Patient Instructions (Addendum)
Medication Instructions:   - COMPLETE THE CURRENT BOTTLE OF ASPIRIN , THEN YOU MAY STOP TAKING THEM ALL TOGEGHTER  If you need a refill on your cardiac medications before your next appointment, please call your pharmacy.   Lab work: LIPID CMP- FASTING- THE DAY OF TEST  If you have labs (blood work) drawn today and your tests are completely normal, you will receive your results only by: Marland Kitchen MyChart Message (if you have MyChart) OR . A paper copy in the mail If you have any lab test that is abnormal or we need to change your treatment, we will call you to review the results.  Testing/Procedures: NOT NEEDED  Follow-Up: At [facility], you and your health needs are our priority.  As part of our continuing mission to provide you with exceptional heart care, we have created designated Provider Care Teams.  These Care Teams include your primary Cardiologist (physician) and Advanced Practice Providers (APPs -  Physician Assistants and Nurse Practitioners) who all work together to provide you with the care you need, when you need it. . You will need a follow up appointment in 6  Months- MARCH 2021.  Please call our office 2 months in advance to schedule this appointment.  You may see Glenetta Hew, MD or one of the following Advanced Practice Providers on your designated Care Team:   . Rosaria Ferries, PA-C . Jory Sims, DNP, ANP  Any Other Special Instructions Will Be Listed Below (If Applicable).

## 2019-05-31 ENCOUNTER — Encounter: Payer: Self-pay | Admitting: Cardiology

## 2019-05-31 NOTE — Assessment & Plan Note (Signed)
No anginal symptoms since his last PCI.  He is on a beta-blocker and ARB along with statin.  Now on Effient, tolerating it well along with aspirin.  Beyond 1 year out, so we can stop aspirin.  He just refilled a bottle of aspirin so when this bottle is complete, he will stop taking aspirin.  This will free him up to take Aleve for his back pain.

## 2019-05-31 NOTE — Assessment & Plan Note (Addendum)
Slow and steady weight loss since March.  This is despite his back and knee pain issues.  Continue to encourage exercise and dietary modification.

## 2019-05-31 NOTE — Assessment & Plan Note (Signed)
Well-controlled on current dose of beta-blocker and ARB.  Only using PRN Lasix

## 2019-05-31 NOTE — Assessment & Plan Note (Signed)
He is due for labs to be checked now, but as of last year his lipids were well controlled on current dose of statin. Continue current dose of rosuvastatin.  Check lipid panel within the next few weeks.  If lipids continue to be as well controlled, we can probably back off on rosuvastatin.  Maybe this will help some of the stiffness.

## 2019-06-01 DIAGNOSIS — Z23 Encounter for immunization: Secondary | ICD-10-CM | POA: Diagnosis not present

## 2019-06-01 DIAGNOSIS — E538 Deficiency of other specified B group vitamins: Secondary | ICD-10-CM | POA: Diagnosis not present

## 2019-06-02 DIAGNOSIS — E785 Hyperlipidemia, unspecified: Secondary | ICD-10-CM | POA: Diagnosis not present

## 2019-06-02 DIAGNOSIS — I2510 Atherosclerotic heart disease of native coronary artery without angina pectoris: Secondary | ICD-10-CM | POA: Diagnosis not present

## 2019-06-02 DIAGNOSIS — Z9861 Coronary angioplasty status: Secondary | ICD-10-CM | POA: Diagnosis not present

## 2019-06-02 LAB — COMPREHENSIVE METABOLIC PANEL
ALT: 12 IU/L (ref 0–44)
AST: 20 IU/L (ref 0–40)
Albumin/Globulin Ratio: 2.1 (ref 1.2–2.2)
Albumin: 4.4 g/dL (ref 3.7–4.7)
Alkaline Phosphatase: 59 IU/L (ref 39–117)
BUN/Creatinine Ratio: 15 (ref 10–24)
BUN: 14 mg/dL (ref 8–27)
Bilirubin Total: 0.8 mg/dL (ref 0.0–1.2)
CO2: 24 mmol/L (ref 20–29)
Calcium: 9.2 mg/dL (ref 8.6–10.2)
Chloride: 103 mmol/L (ref 96–106)
Creatinine, Ser: 0.92 mg/dL (ref 0.76–1.27)
GFR calc Af Amer: 90 mL/min/{1.73_m2} (ref >59)
GFR calc non Af Amer: 78 mL/min/{1.73_m2} (ref >59)
Globulin, Total: 2.1 g/dL (ref 1.5–4.5)
Glucose: 98 mg/dL (ref 65–99)
Potassium: 4.2 mmol/L (ref 3.5–5.2)
Sodium: 140 mmol/L (ref 134–144)
Total Protein: 6.5 g/dL (ref 6.0–8.5)

## 2019-06-02 LAB — LIPID PANEL
Chol/HDL Ratio: 2.3 ratio (ref 0.0–5.0)
Cholesterol, Total: 103 mg/dL (ref 100–199)
HDL: 44 mg/dL (ref >39)
LDL Chol Calc (NIH): 45 mg/dL (ref 0–99)
Triglycerides: 65 mg/dL (ref 0–149)
VLDL Cholesterol Cal: 14 mg/dL (ref 5–40)

## 2019-06-09 ENCOUNTER — Telehealth: Payer: Self-pay | Admitting: Cardiology

## 2019-06-09 NOTE — Telephone Encounter (Signed)
I have not had a chance to review labs yet.  They will be reviewed soon  Glenetta Hew, MD

## 2019-06-09 NOTE — Telephone Encounter (Signed)
° ° °  Please return call to patient with lab results 

## 2019-06-09 NOTE — Telephone Encounter (Signed)
Called patient- advised of un offical results, but would verify with Dr.Harding no changes or concerns on his side. Patient verbalized understanding.

## 2019-06-10 NOTE — Telephone Encounter (Signed)
Noted.  Patient notified they would be contacted when reviewed by Dr.Harding.

## 2019-06-22 NOTE — Progress Notes (Signed)
Cholesterol levels look great.  LDL is 45, total cholesterol is 103.  This is really putting you well in the target range.  I think we can reduce rosuvastatin to 20 mg daily.  Would probably reassess in about 6 months to make sure is not going the wrong direction.  Noah Scott ce

## 2019-06-24 ENCOUNTER — Telehealth: Payer: Self-pay | Admitting: *Deleted

## 2019-06-24 DIAGNOSIS — Z9861 Coronary angioplasty status: Secondary | ICD-10-CM

## 2019-06-24 DIAGNOSIS — I2510 Atherosclerotic heart disease of native coronary artery without angina pectoris: Secondary | ICD-10-CM

## 2019-06-24 DIAGNOSIS — E785 Hyperlipidemia, unspecified: Secondary | ICD-10-CM

## 2019-06-24 NOTE — Telephone Encounter (Signed)
-----   Message from Leonie Man, MD sent at 06/22/2019 10:25 PM EDT ----- Cholesterol levels look great.  LDL is 45, total cholesterol is 103.  This is really putting you well in the target range.  I think we can reduce rosuvastatin to 20 mg daily.  Would probably reassess in about 6 months to make sure is not going the wrong direction.  Glenetta Hew ce

## 2019-06-24 NOTE — Telephone Encounter (Signed)
The patient has been notified of the result and verbalized understanding.  All questions (if any) were answered. Raiford Simmonds, RN 06/24/2019 11:36 AM  LABS ORDER FOR 6 MONTH - April 20201

## 2019-06-29 DIAGNOSIS — E538 Deficiency of other specified B group vitamins: Secondary | ICD-10-CM | POA: Diagnosis not present

## 2019-06-29 DIAGNOSIS — L989 Disorder of the skin and subcutaneous tissue, unspecified: Secondary | ICD-10-CM | POA: Diagnosis not present

## 2019-06-29 DIAGNOSIS — Z0000 Encounter for general adult medical examination without abnormal findings: Secondary | ICD-10-CM | POA: Diagnosis not present

## 2019-06-29 DIAGNOSIS — M5440 Lumbago with sciatica, unspecified side: Secondary | ICD-10-CM | POA: Diagnosis not present

## 2019-06-29 DIAGNOSIS — G8929 Other chronic pain: Secondary | ICD-10-CM | POA: Diagnosis not present

## 2019-06-29 DIAGNOSIS — I2510 Atherosclerotic heart disease of native coronary artery without angina pectoris: Secondary | ICD-10-CM | POA: Diagnosis not present

## 2019-06-29 DIAGNOSIS — I10 Essential (primary) hypertension: Secondary | ICD-10-CM | POA: Diagnosis not present

## 2019-06-29 DIAGNOSIS — N401 Enlarged prostate with lower urinary tract symptoms: Secondary | ICD-10-CM | POA: Diagnosis not present

## 2019-06-29 DIAGNOSIS — I214 Non-ST elevation (NSTEMI) myocardial infarction: Secondary | ICD-10-CM | POA: Diagnosis not present

## 2019-07-02 DIAGNOSIS — E538 Deficiency of other specified B group vitamins: Secondary | ICD-10-CM | POA: Diagnosis not present

## 2019-07-13 DIAGNOSIS — D225 Melanocytic nevi of trunk: Secondary | ICD-10-CM | POA: Diagnosis not present

## 2019-07-13 DIAGNOSIS — N401 Enlarged prostate with lower urinary tract symptoms: Secondary | ICD-10-CM | POA: Diagnosis not present

## 2019-07-13 DIAGNOSIS — Z85828 Personal history of other malignant neoplasm of skin: Secondary | ICD-10-CM | POA: Diagnosis not present

## 2019-07-13 DIAGNOSIS — I214 Non-ST elevation (NSTEMI) myocardial infarction: Secondary | ICD-10-CM | POA: Diagnosis not present

## 2019-07-13 DIAGNOSIS — L570 Actinic keratosis: Secondary | ICD-10-CM | POA: Diagnosis not present

## 2019-07-13 DIAGNOSIS — I10 Essential (primary) hypertension: Secondary | ICD-10-CM | POA: Diagnosis not present

## 2019-07-13 DIAGNOSIS — L814 Other melanin hyperpigmentation: Secondary | ICD-10-CM | POA: Diagnosis not present

## 2019-07-13 DIAGNOSIS — E785 Hyperlipidemia, unspecified: Secondary | ICD-10-CM | POA: Diagnosis not present

## 2019-07-13 DIAGNOSIS — D1801 Hemangioma of skin and subcutaneous tissue: Secondary | ICD-10-CM | POA: Diagnosis not present

## 2019-07-13 DIAGNOSIS — L821 Other seborrheic keratosis: Secondary | ICD-10-CM | POA: Diagnosis not present

## 2019-07-13 DIAGNOSIS — I2510 Atherosclerotic heart disease of native coronary artery without angina pectoris: Secondary | ICD-10-CM | POA: Diagnosis not present

## 2019-07-22 DIAGNOSIS — M545 Low back pain: Secondary | ICD-10-CM | POA: Diagnosis not present

## 2019-07-22 DIAGNOSIS — R262 Difficulty in walking, not elsewhere classified: Secondary | ICD-10-CM | POA: Diagnosis not present

## 2019-07-24 DIAGNOSIS — R262 Difficulty in walking, not elsewhere classified: Secondary | ICD-10-CM | POA: Diagnosis not present

## 2019-07-24 DIAGNOSIS — M545 Low back pain: Secondary | ICD-10-CM | POA: Diagnosis not present

## 2019-07-28 DIAGNOSIS — R262 Difficulty in walking, not elsewhere classified: Secondary | ICD-10-CM | POA: Diagnosis not present

## 2019-07-28 DIAGNOSIS — M545 Low back pain: Secondary | ICD-10-CM | POA: Diagnosis not present

## 2019-07-30 DIAGNOSIS — R262 Difficulty in walking, not elsewhere classified: Secondary | ICD-10-CM | POA: Diagnosis not present

## 2019-07-30 DIAGNOSIS — M545 Low back pain: Secondary | ICD-10-CM | POA: Diagnosis not present

## 2019-08-03 DIAGNOSIS — R262 Difficulty in walking, not elsewhere classified: Secondary | ICD-10-CM | POA: Diagnosis not present

## 2019-08-03 DIAGNOSIS — M545 Low back pain: Secondary | ICD-10-CM | POA: Diagnosis not present

## 2019-08-04 DIAGNOSIS — E538 Deficiency of other specified B group vitamins: Secondary | ICD-10-CM | POA: Diagnosis not present

## 2019-08-06 DIAGNOSIS — R262 Difficulty in walking, not elsewhere classified: Secondary | ICD-10-CM | POA: Diagnosis not present

## 2019-08-06 DIAGNOSIS — M545 Low back pain: Secondary | ICD-10-CM | POA: Diagnosis not present

## 2019-08-11 DIAGNOSIS — R262 Difficulty in walking, not elsewhere classified: Secondary | ICD-10-CM | POA: Diagnosis not present

## 2019-08-11 DIAGNOSIS — M545 Low back pain: Secondary | ICD-10-CM | POA: Diagnosis not present

## 2019-08-18 DIAGNOSIS — R262 Difficulty in walking, not elsewhere classified: Secondary | ICD-10-CM | POA: Diagnosis not present

## 2019-08-18 DIAGNOSIS — M545 Low back pain: Secondary | ICD-10-CM | POA: Diagnosis not present

## 2019-08-20 IMAGING — CR DG CHEST 2V
2 series · 2 of 2 positions shown · non-contrast
Comparison: None.

CLINICAL DATA: Nausea and dizziness.  Hypertension

EXAM:
CHEST  2 VIEW

[w chest pa]
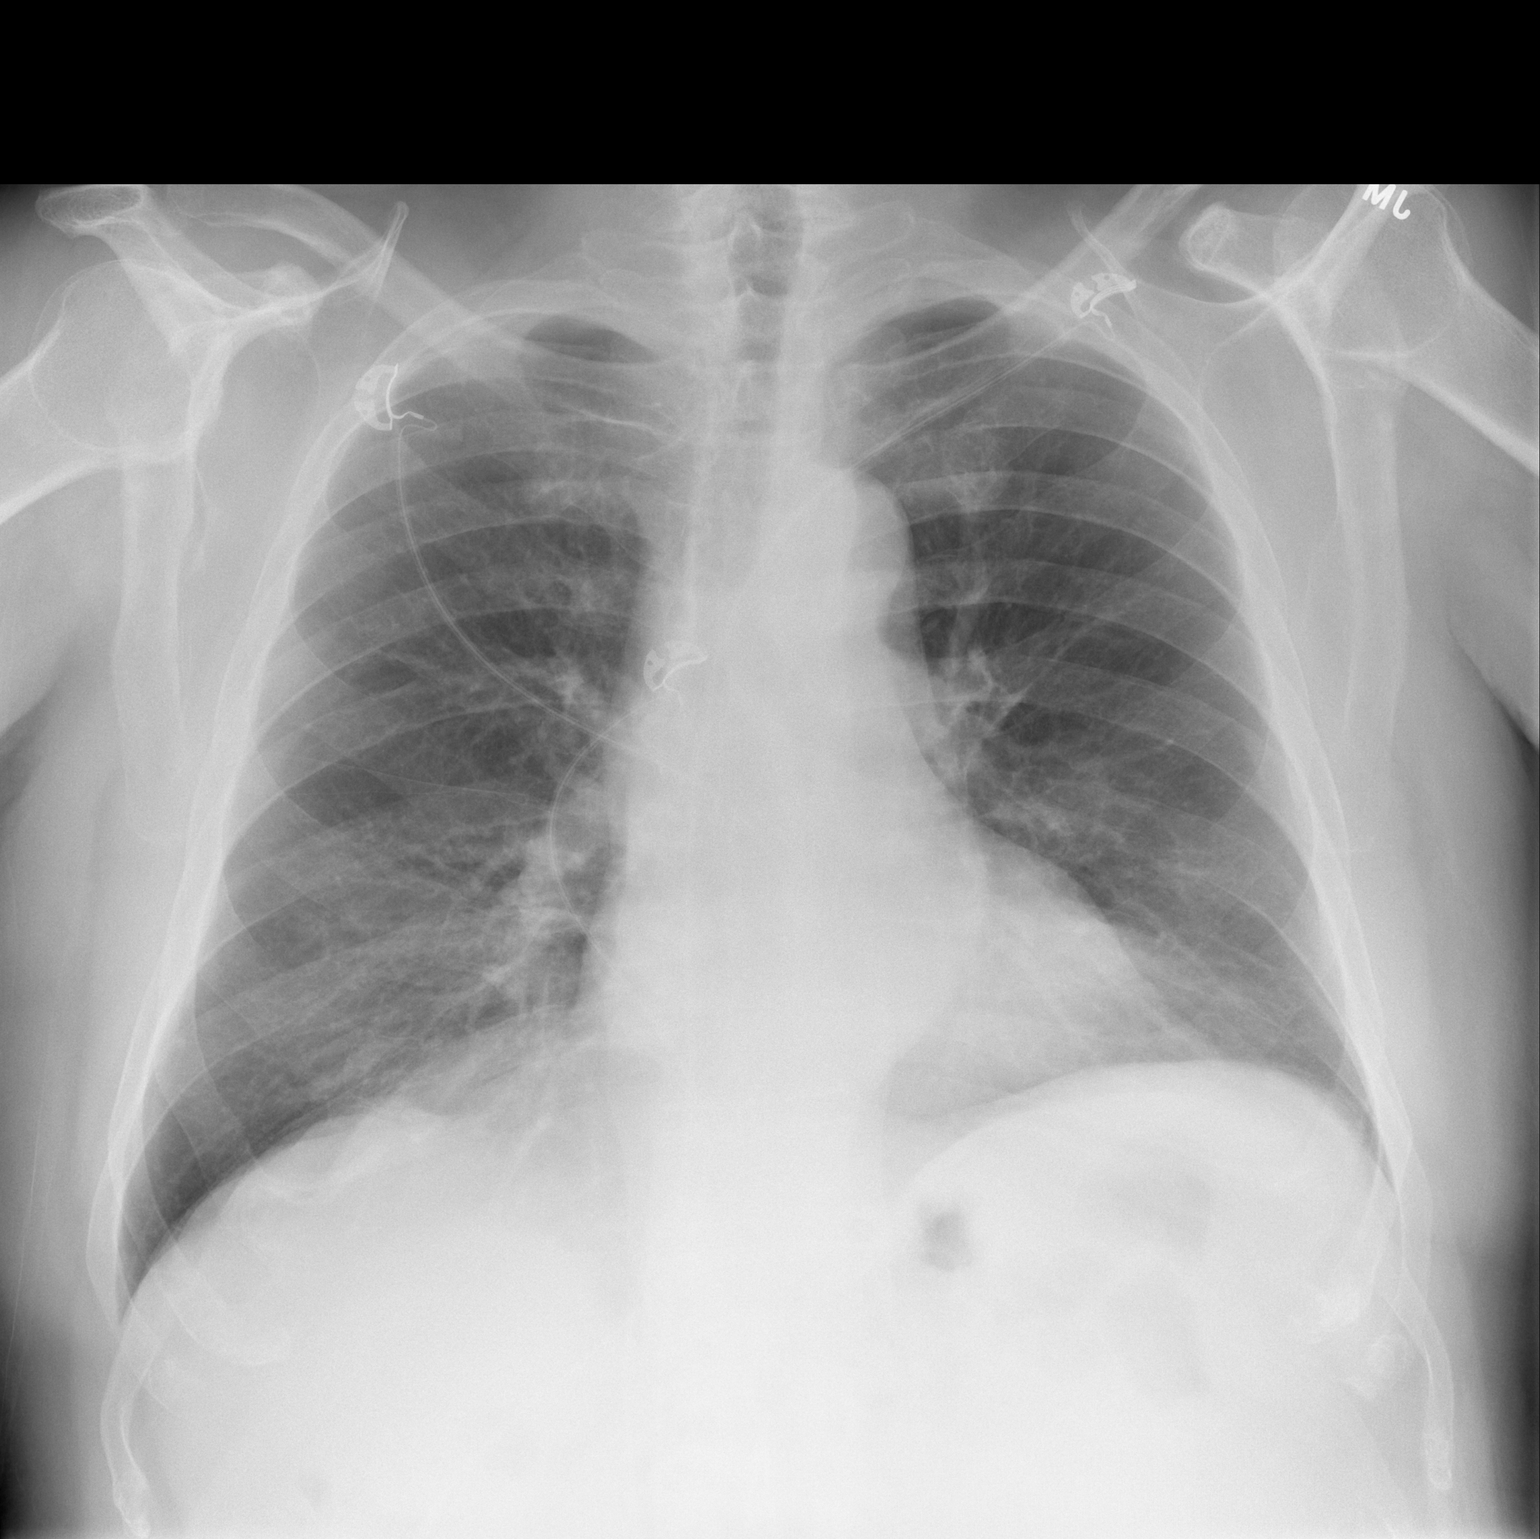

[w chest lat *]
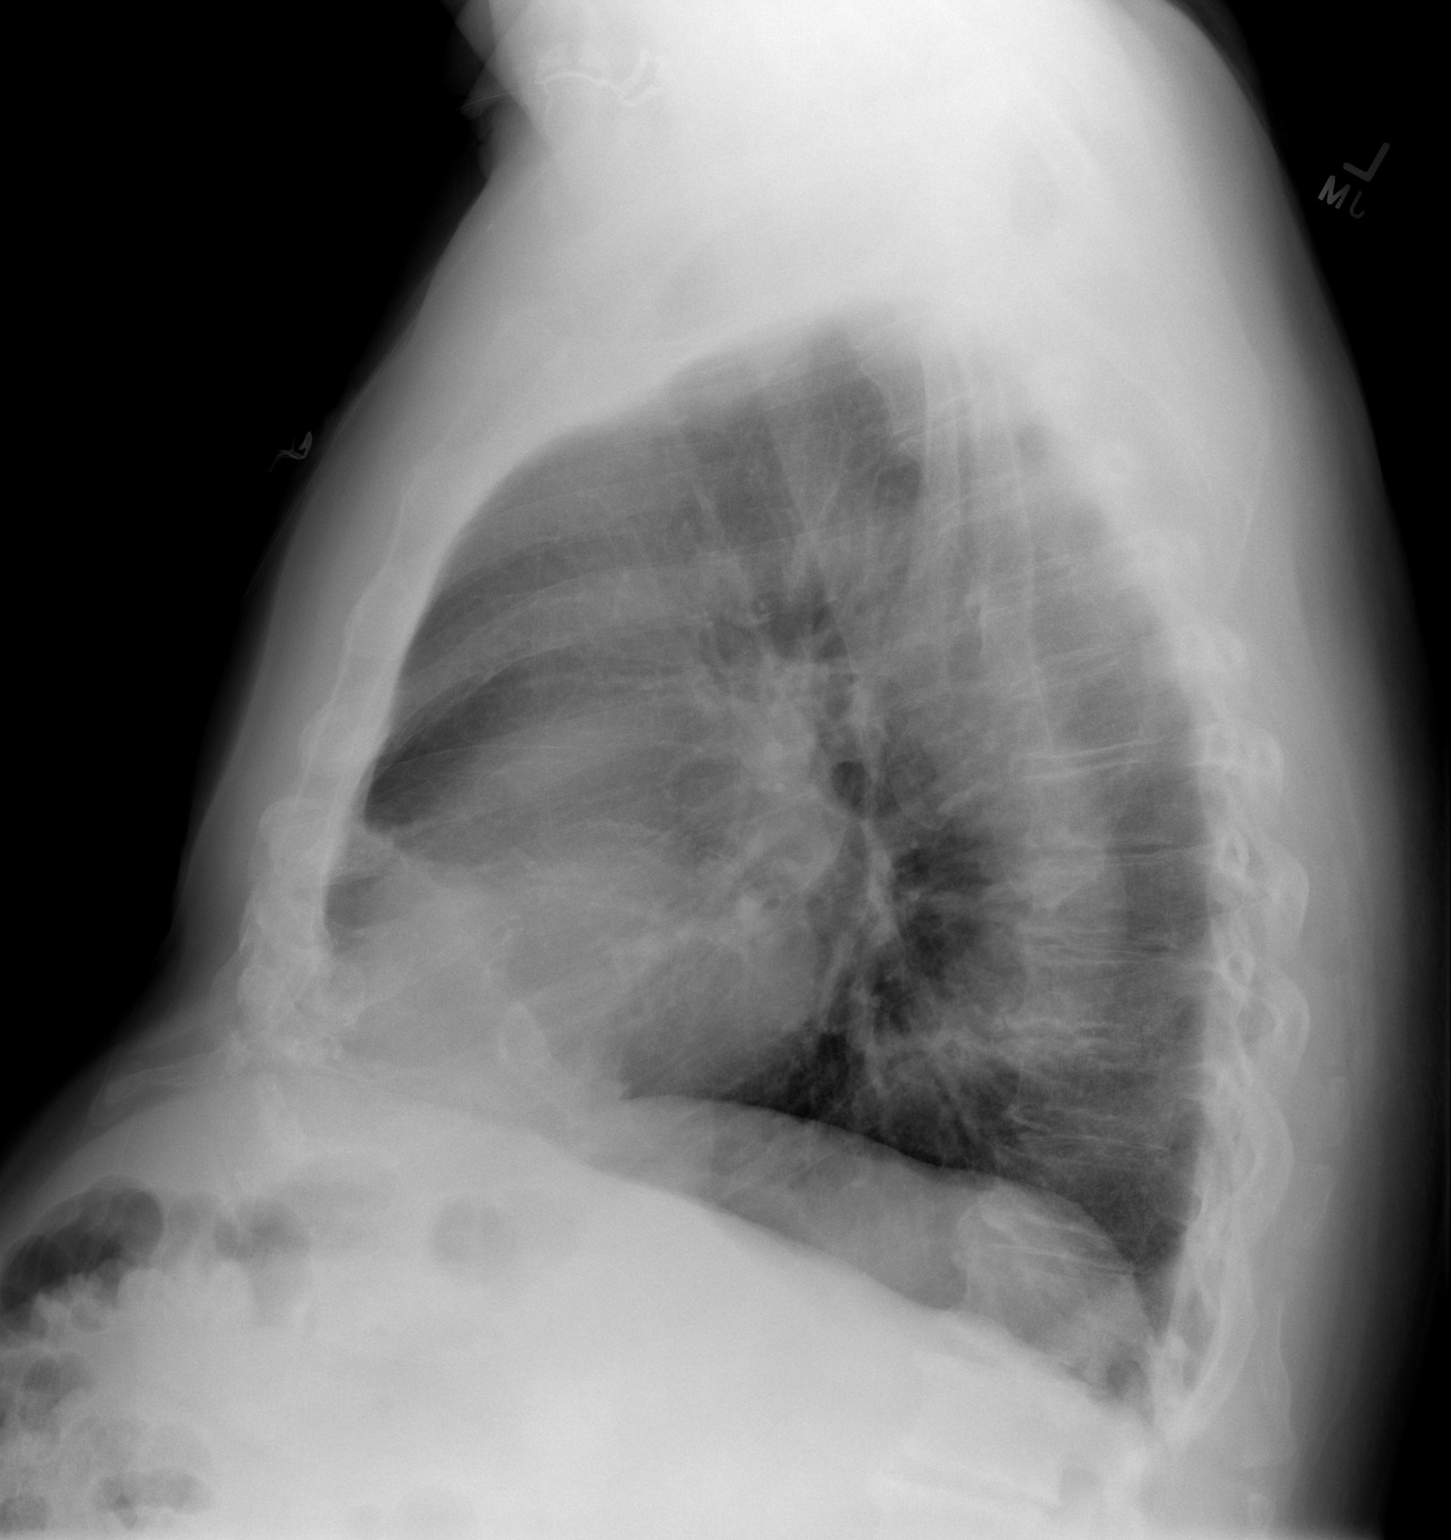

[2 of 2 positions shown; findings below may reference images not displayed]

FINDINGS: There is no edema or consolidation. Heart size and pulmonary
vascularity are normal. No adenopathy. Aorta is mildly tortuous.
There is degenerative change in the thoracic spine.
IMPRESSION: No edema or consolidation.

## 2019-09-04 DIAGNOSIS — E538 Deficiency of other specified B group vitamins: Secondary | ICD-10-CM | POA: Diagnosis not present

## 2019-10-08 DIAGNOSIS — E538 Deficiency of other specified B group vitamins: Secondary | ICD-10-CM | POA: Diagnosis not present

## 2019-11-01 IMAGING — DX DG NECK SOFT TISSUE
2 series · 2 of 2 positions shown · non-contrast
Comparison: None.

CLINICAL DATA: Sensation of object stuck in throat. Cough, acute
onset.

EXAM:
NECK SOFT TISSUES - 1+ VIEW

[neck lat]
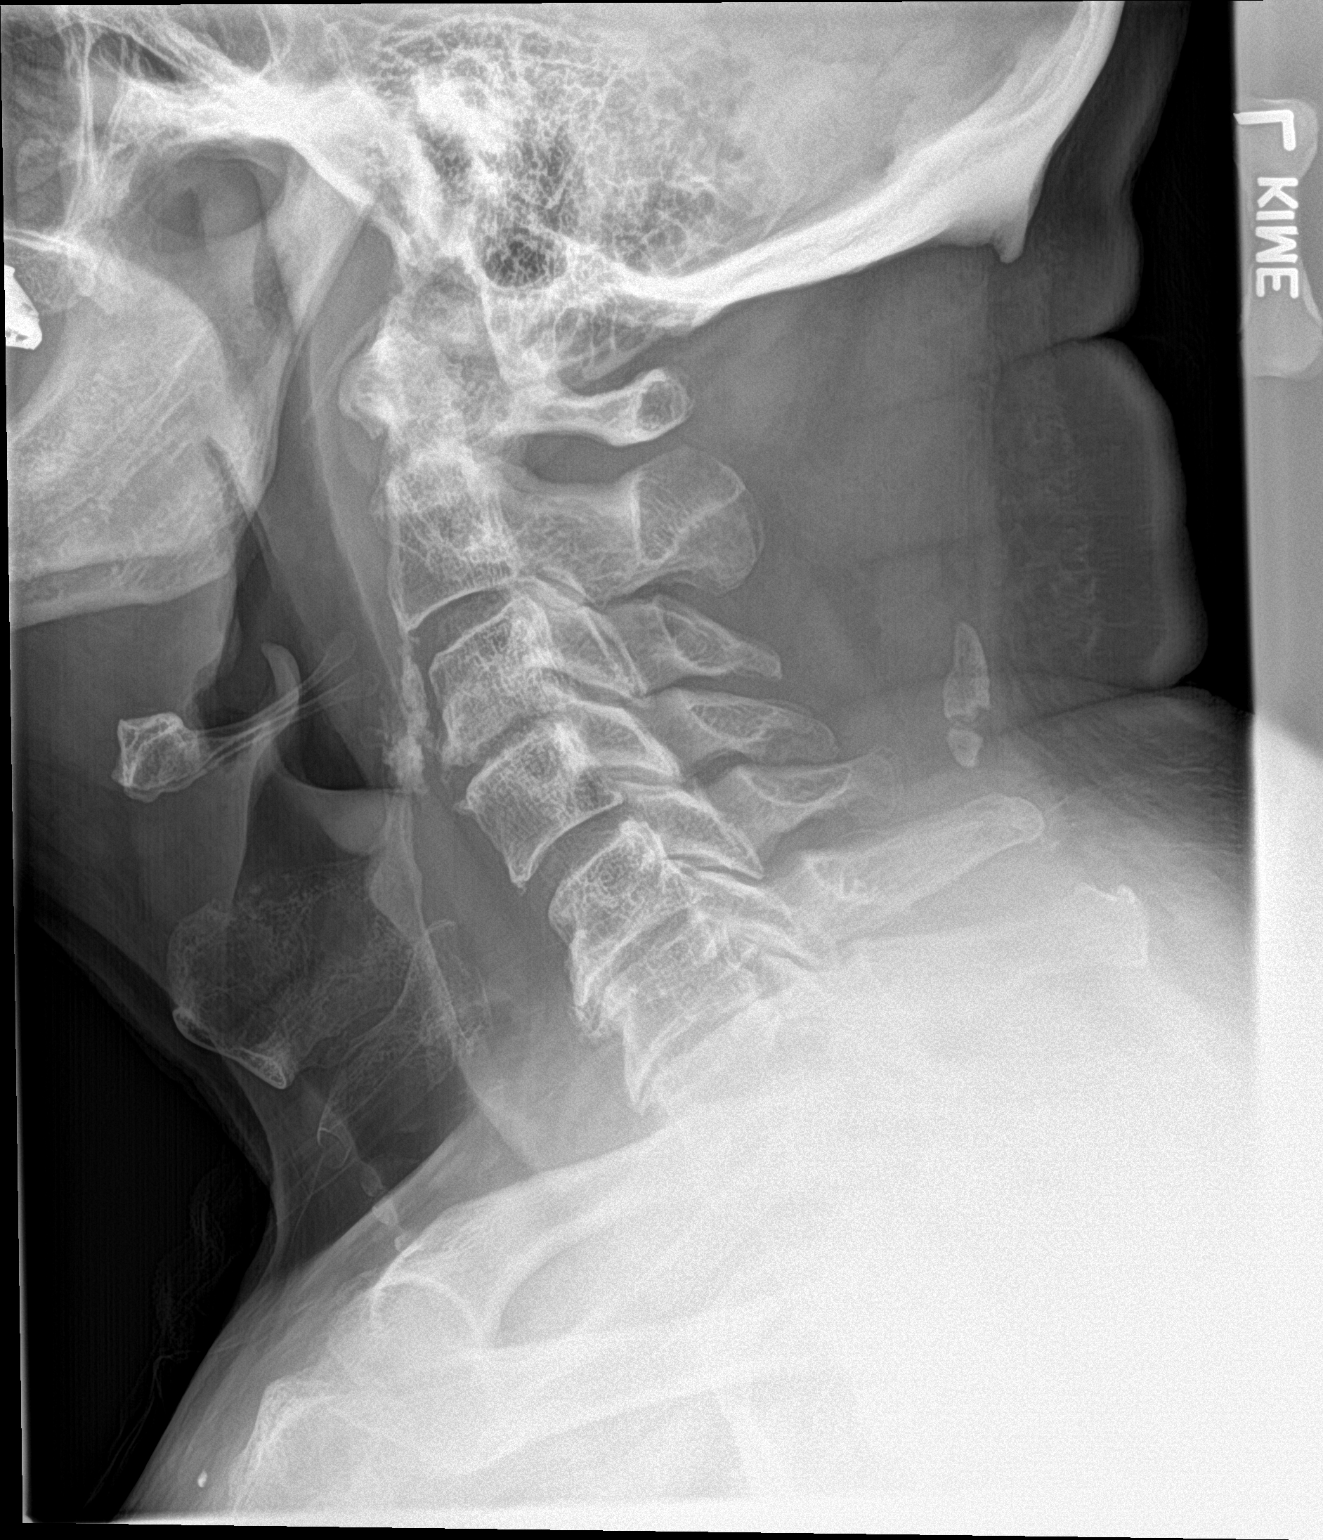

[neck ap]
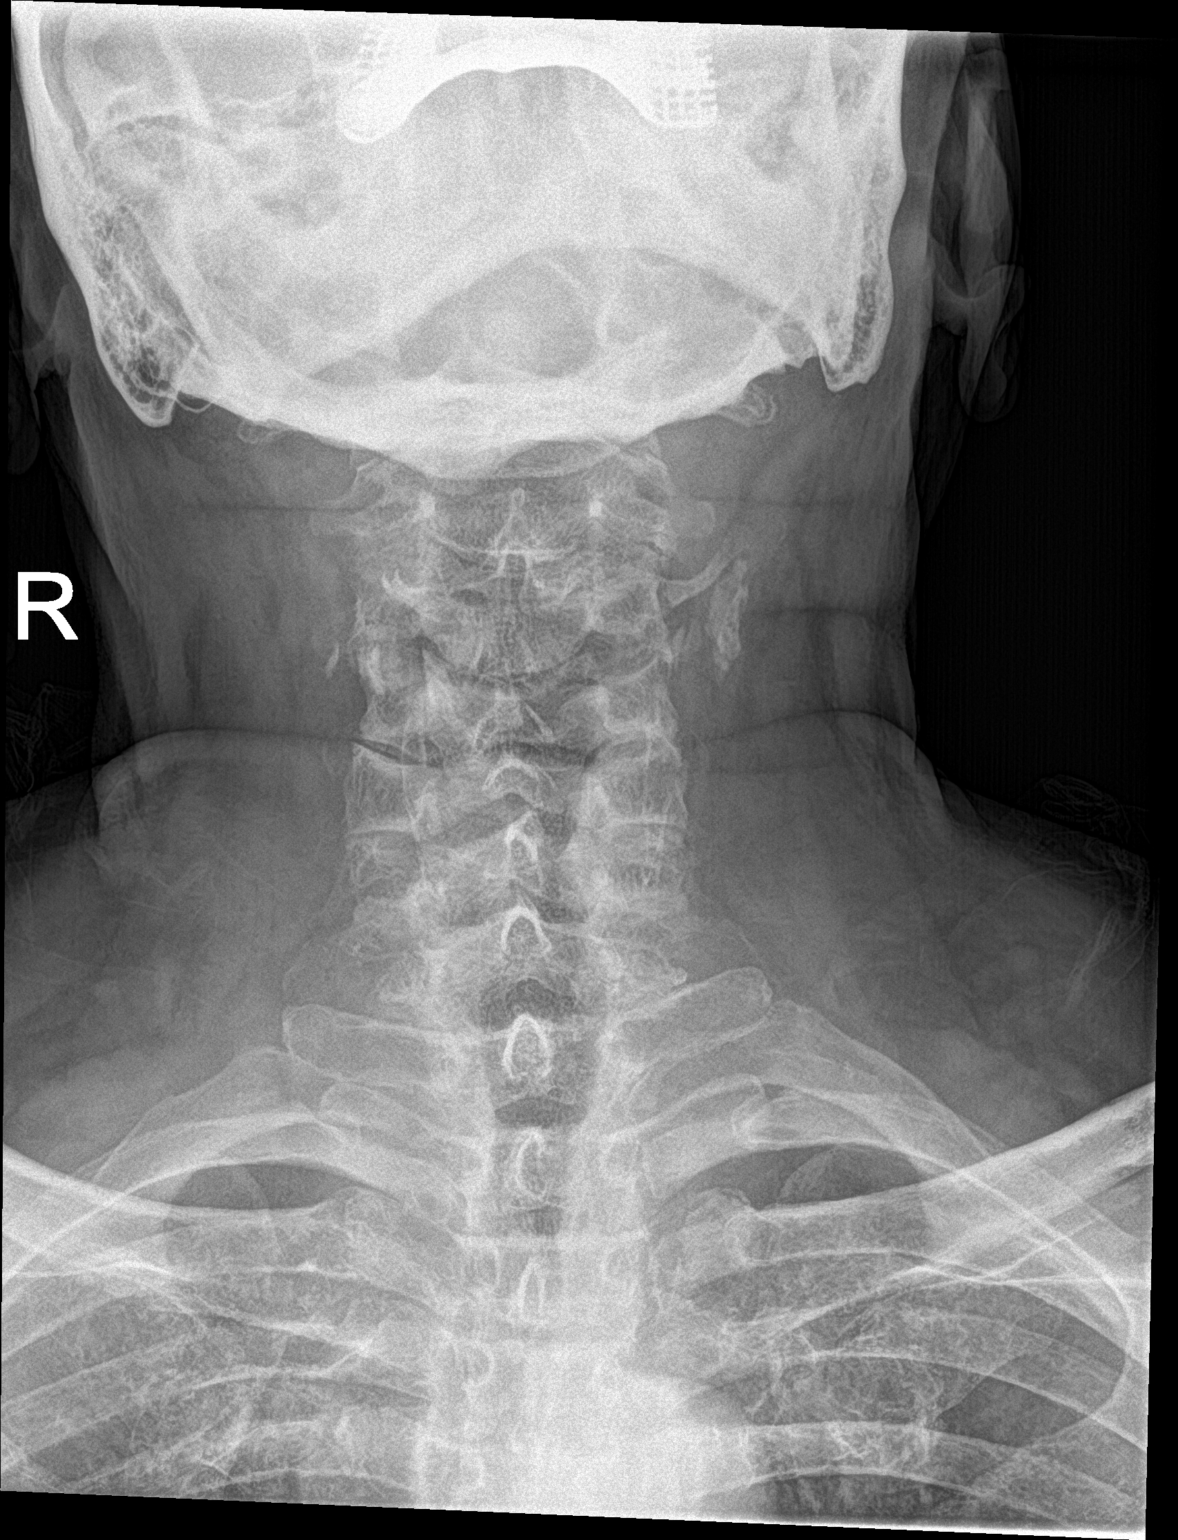

[2 of 2 positions shown; findings below may reference images not displayed]

FINDINGS: The nasopharynx, oropharynx and hypopharynx are unremarkable. The
proximal trachea is unremarkable. The epiglottis is normal in
thickness. Prevertebral soft tissues are within normal limits. No
radiopaque foreign bodies are seen.

Mild degenerative change is noted along the lower cervical spine.
The visualized lung apices are clear.
IMPRESSION: Unremarkable radiographs of the soft tissues of the neck. No
radiopaque foreign bodies seen.

## 2019-11-11 DIAGNOSIS — E538 Deficiency of other specified B group vitamins: Secondary | ICD-10-CM | POA: Diagnosis not present

## 2019-11-13 ENCOUNTER — Other Ambulatory Visit: Payer: Self-pay | Admitting: Cardiology

## 2019-12-07 DIAGNOSIS — I2510 Atherosclerotic heart disease of native coronary artery without angina pectoris: Secondary | ICD-10-CM | POA: Diagnosis not present

## 2019-12-07 DIAGNOSIS — I872 Venous insufficiency (chronic) (peripheral): Secondary | ICD-10-CM | POA: Diagnosis not present

## 2019-12-07 DIAGNOSIS — E538 Deficiency of other specified B group vitamins: Secondary | ICD-10-CM | POA: Diagnosis not present

## 2019-12-07 DIAGNOSIS — R600 Localized edema: Secondary | ICD-10-CM | POA: Diagnosis not present

## 2019-12-07 DIAGNOSIS — G8929 Other chronic pain: Secondary | ICD-10-CM | POA: Diagnosis not present

## 2019-12-07 DIAGNOSIS — I10 Essential (primary) hypertension: Secondary | ICD-10-CM | POA: Diagnosis not present

## 2020-01-07 DIAGNOSIS — E538 Deficiency of other specified B group vitamins: Secondary | ICD-10-CM | POA: Diagnosis not present

## 2020-01-08 IMAGING — DX DG CHEST 2V
2 series · 2 of 2 positions shown · non-contrast
Comparison: 07/03/2017

CLINICAL DATA: Burning sensation in the upper chest

EXAM:
CHEST - 2 VIEW

[chest pa]
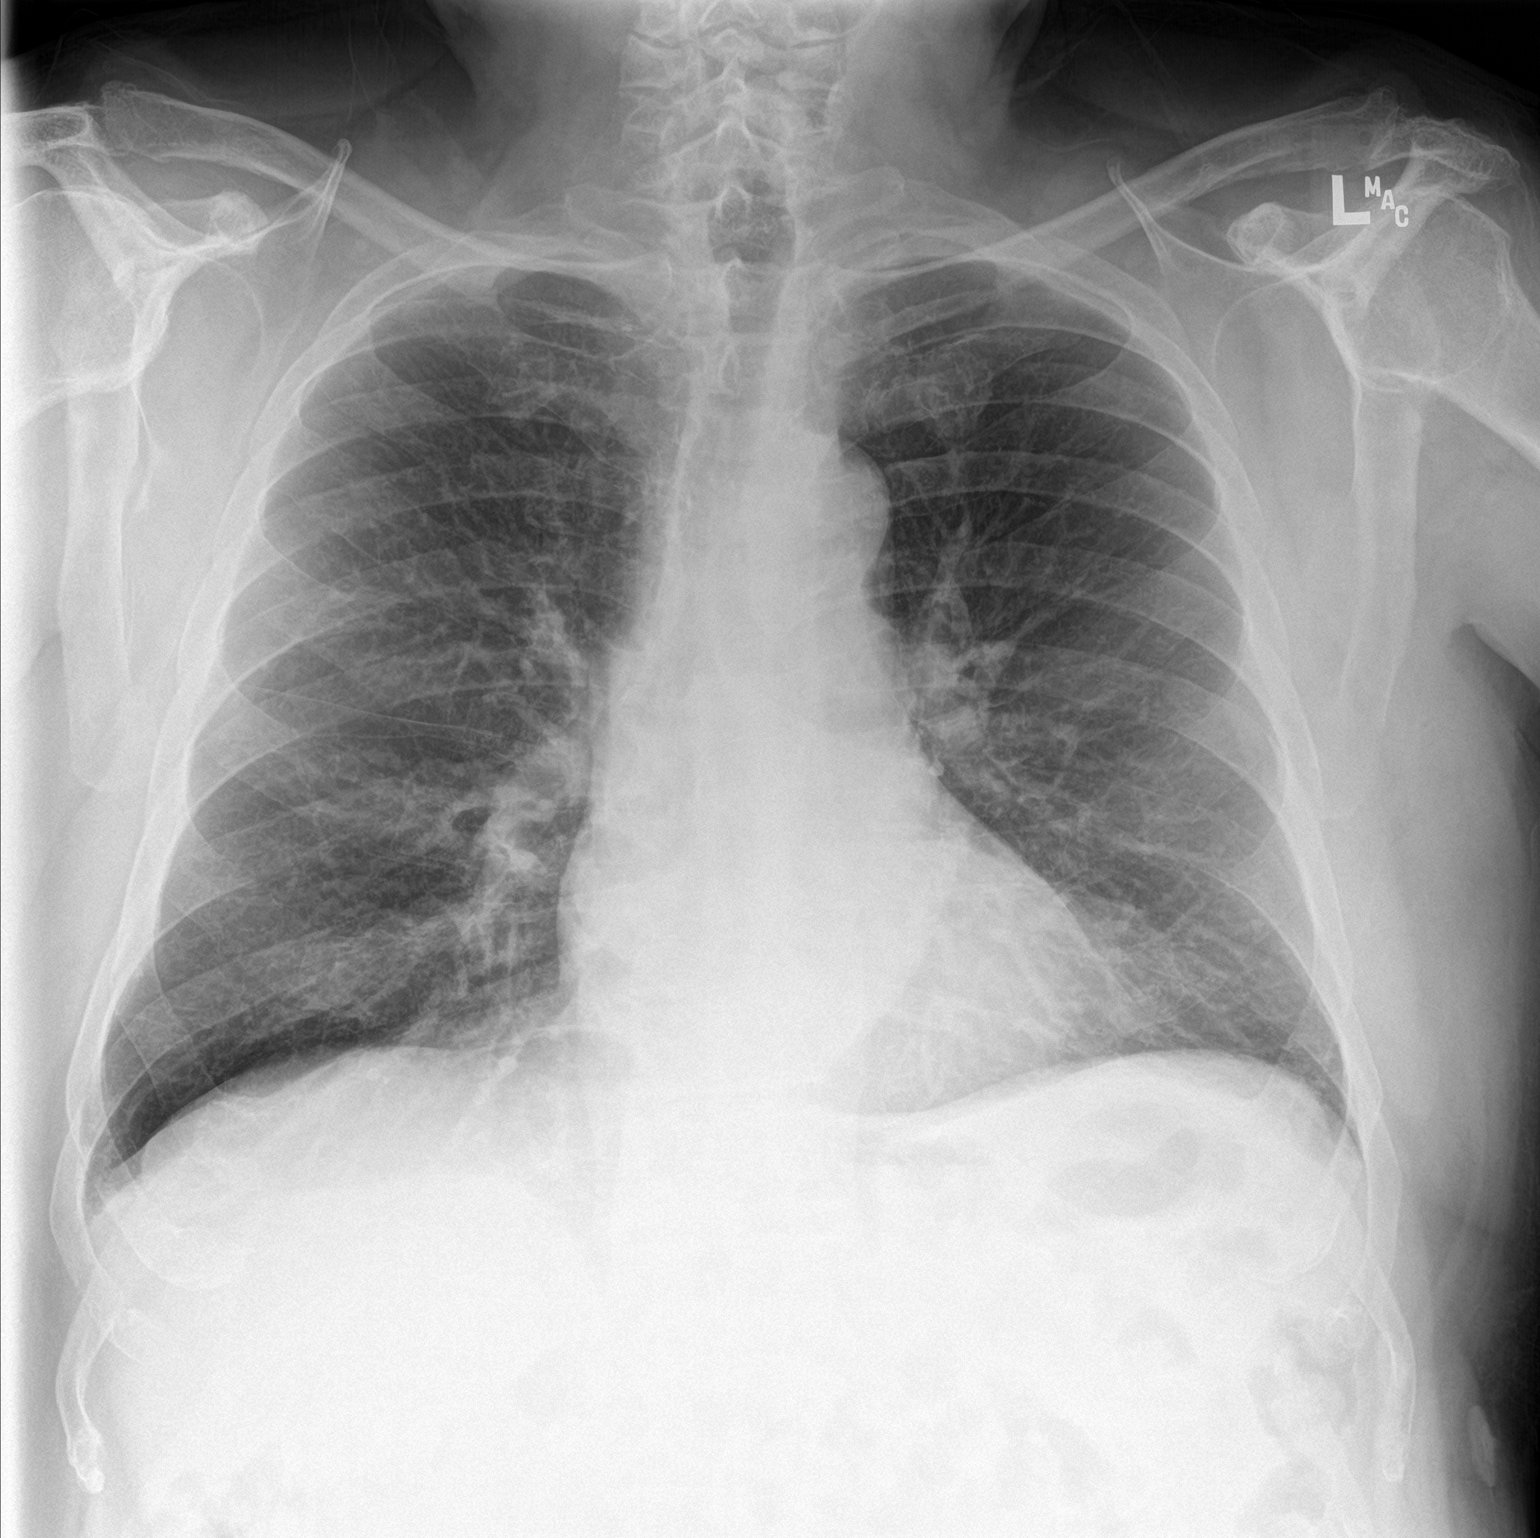

[chest lat]
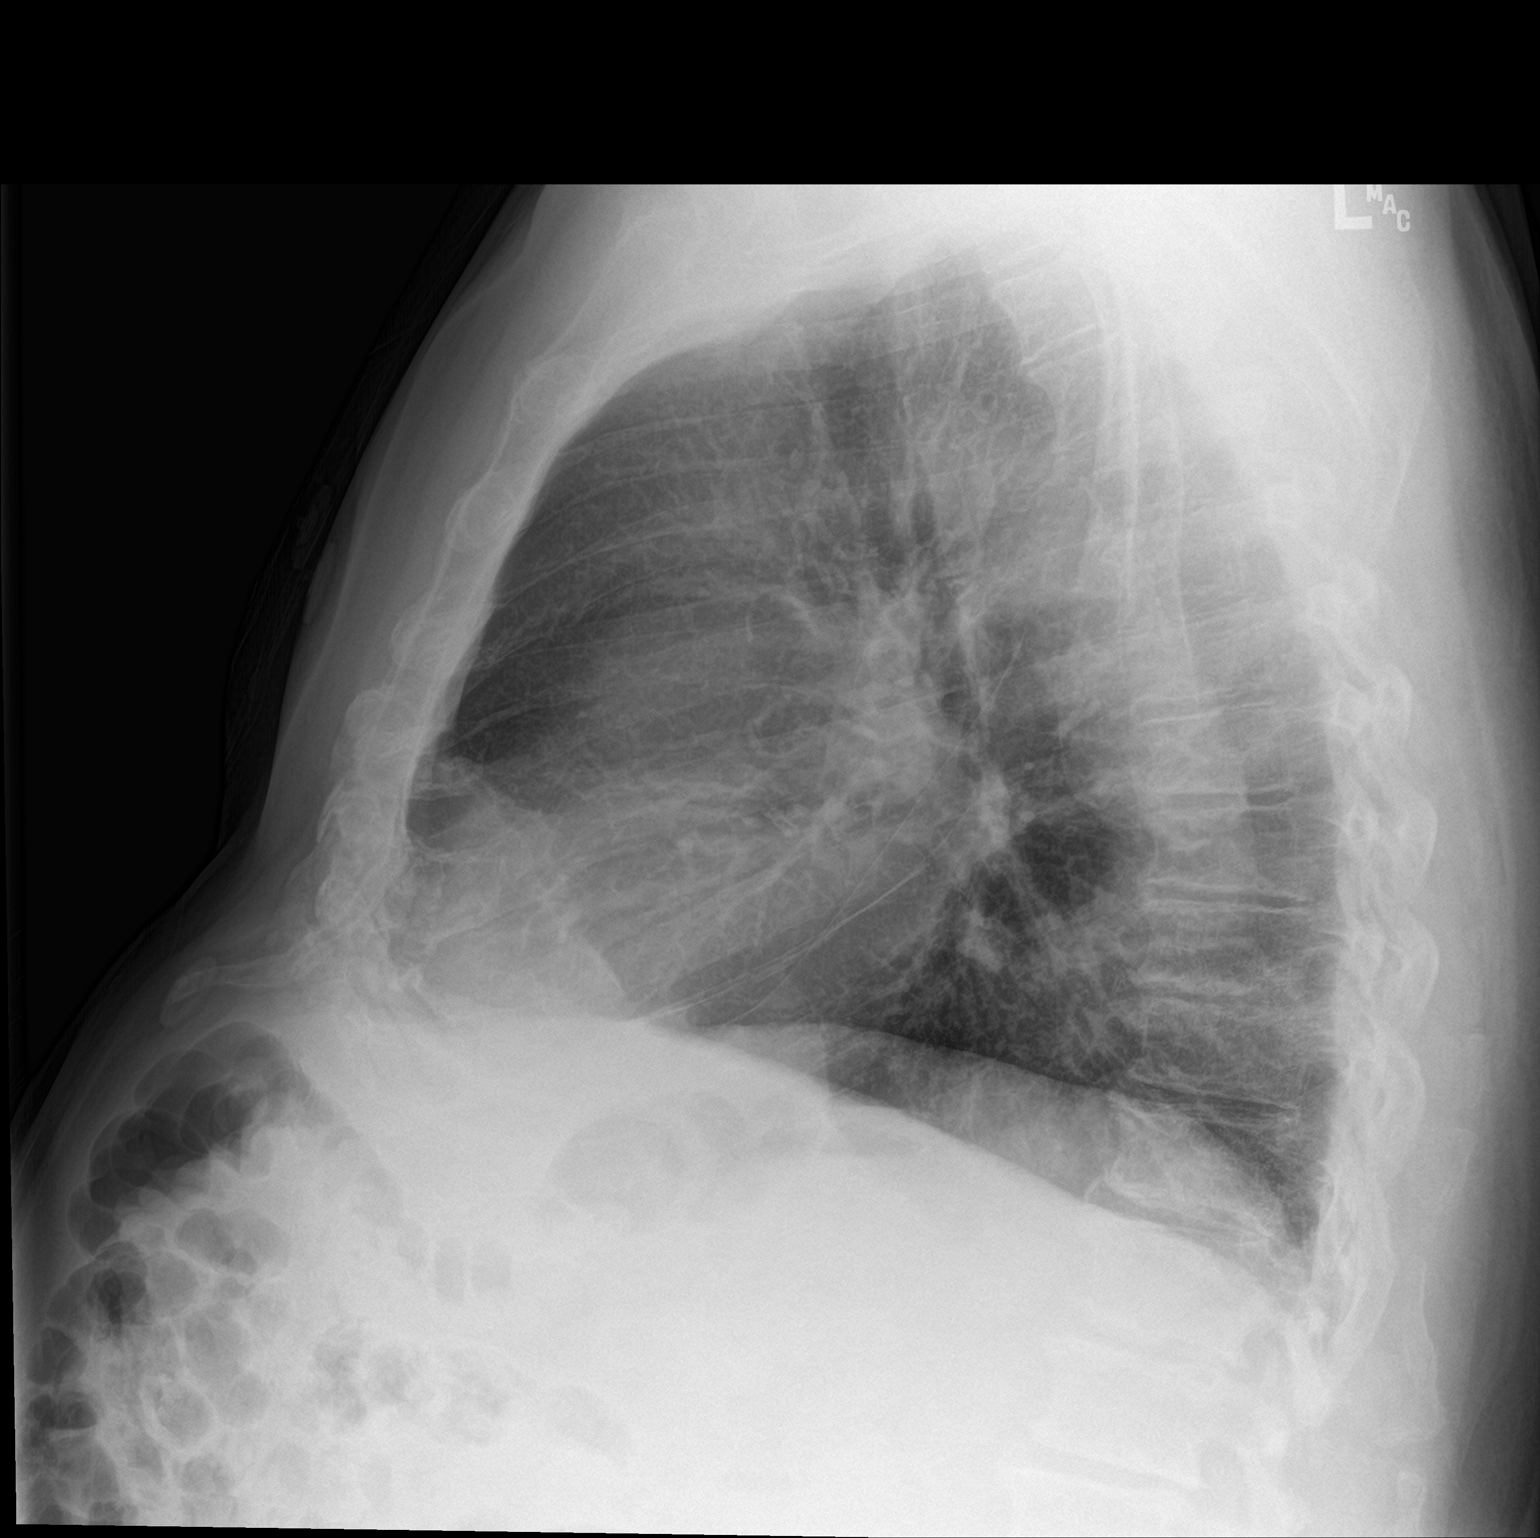

[2 of 2 positions shown; findings below may reference images not displayed]

FINDINGS: Streaky atelectasis or scarring at the lingula. No acute
consolidation or effusion. Normal heart size. Aortic
atherosclerosis. No pneumothorax. Degenerative changes of the spine.
IMPRESSION: No active cardiopulmonary disease.

## 2020-01-18 ENCOUNTER — Encounter: Payer: Self-pay | Admitting: Cardiology

## 2020-01-18 ENCOUNTER — Ambulatory Visit (INDEPENDENT_AMBULATORY_CARE_PROVIDER_SITE_OTHER): Payer: Medicare Other | Admitting: Cardiology

## 2020-01-18 ENCOUNTER — Other Ambulatory Visit: Payer: Self-pay

## 2020-01-18 VITALS — BP 142/72 | HR 66 | Ht 70.0 in | Wt 223.0 lb

## 2020-01-18 DIAGNOSIS — I214 Non-ST elevation (NSTEMI) myocardial infarction: Secondary | ICD-10-CM | POA: Diagnosis not present

## 2020-01-18 DIAGNOSIS — I10 Essential (primary) hypertension: Secondary | ICD-10-CM

## 2020-01-18 DIAGNOSIS — E785 Hyperlipidemia, unspecified: Secondary | ICD-10-CM

## 2020-01-18 DIAGNOSIS — Z9861 Coronary angioplasty status: Secondary | ICD-10-CM

## 2020-01-18 DIAGNOSIS — E669 Obesity, unspecified: Secondary | ICD-10-CM | POA: Diagnosis not present

## 2020-01-18 DIAGNOSIS — I2510 Atherosclerotic heart disease of native coronary artery without angina pectoris: Secondary | ICD-10-CM

## 2020-01-18 NOTE — Patient Instructions (Signed)
Medication Instructions:  No changes *If you need a refill on your cardiac medications before your next appointment, please call your pharmacy*   Lab Work: Lipid hepatic - in 3 months  Fasting   If you have labs (blood work) drawn today and your tests are completely normal, you will receive your results only by: Marland Kitchen MyChart Message (if you have MyChart) OR . A paper copy in the mail If you have any lab test that is abnormal or we need to change your treatment, we will call you to review the results.   Testing/Procedures: Not needed   Follow-Up: At [facility], you and your health needs are our priority.  As part of our continuing mission to provide you with exceptional heart care, we have created designated Provider Care Teams.  These Care Teams include your primary Cardiologist (physician) and Advanced Practice Providers (APPs -  Physician Assistants and Nurse Practitioners) who all work together to provide you with the care you need, when you need it.  We recommend signing up for the patient portal called "MyChart".  Sign up information is provided on this After Visit Summary.  MyChart is used to connect with patients for Virtual Visits (Telemedicine).  Patients are able to view lab/test results, encounter notes, upcoming appointments, etc.  Non-urgent messages can be sent to your provider as well.   To learn more about what you can do with MyChart, go to [email or web address].    Your next appointment:   6 month(s)  The format for your next appointment:   In Person  Provider:   Glenetta Hew, MD   Other Instructions n/a

## 2020-01-18 NOTE — Progress Notes (Signed)
Primary Care Provider: Leeroy Cha, MD Cardiologist: Glenetta Hew, MD Electrophysiologist: None  Clinic Note: Chief Complaint  Patient presents with  . Follow-up    8 months  . Coronary Artery Disease    No angina     HPI:    Noah Scott is a 84 y.o. male with a PMH notable for CAD-PCI in the setting of non-STEMI who presents today for 35-month follow-up.   NSTEMI 11/21/2017 (He noted that his "ANGINA" equivalent is "severe heartburn radiating to his throat") --> Cath-PCI m-dRCA (DES x 1) & Ost-Prox RCA (2 overlapping DES) -- Difficult, complicated PCI of 3 separate lesions in the RCA requiring use of extra-support buddy wire.; Mod-Severe ISR in OM1 (stent from 2001 - med management pending Back Surgery).  [name] was last seen on 05/29/2019 -> noted he was doing fairly well from a cardiac standpoint just that he was very limited by his back being so stiff.  He regretted having his surgery.  Was doing pain management.  He decided to forego his knee surgery.  As such, he was not able to do much exercise, he was doing water walking when able.  He did note that he was able to go up and down steps at his condo without having any chest pain or pressure.  We stopped aspirin altogether and check labs.  Recent Hospitalizations: None  Reviewed  CV studies:    The following studies were reviewed today: (if available, images/films reviewed: From Epic Chart or Care Everywhere) . None:   Interval History:   Noah Scott returns today for cardiology evaluation stating that he is doing fairly well.  He is not as bothered by his back pain now but still has stiffness.  His knee also bothers him some but he is able to try to walk some.  The [place] doctors were concerned about him having claudication (previously documented his pseudoclaudication), so they have ordered lower extremity Dopplers to be done next month.  He does not describe symptoms classic of claudication.  In fact his  resting leg pain is much as any exertional. Over the course of the last 8 months, we had stopped his Crestor and put him on Zetia and that has helped some of his myalgia.  He has not had any chest pain or pressure with rest or exertion.  None of his anginal equivalent which was basically severe heartburn type symptoms.  He does not note exertional dyspnea until he is having to struggle because of discomfort.  No heart failure symptoms and minimal edema.  He is not really ever having to take any extra doses of furosemide.  Sometimes he forgets to take it.  CV Review of Symptoms (Summary) Cardiovascular ROS: no chest pain or dyspnea on exertion positive for - Well-controlled lower extremity edema.  Exertional dyspnea related to deconditioning negative for - irregular heartbeat, orthopnea, palpitations, paroxysmal nocturnal dyspnea, rapid heart rate, shortness of breath or Syncope/near syncope, TIA/amaurosis fugax, claudication  The patient does not have symptoms concerning for COVID-19 infection (fever, chills, cough, or new shortness of breath).  The patient is practicing social distancing & Masking.   He went to the [place] to get his Covid vaccine shots, and as a result had to establish as a primary care.  The PCP there was concerned about his chronic leg discomfort and ordered lower extremity arterial Dopplers.  REVIEWED OF SYSTEMS   Review of Systems  Constitutional: Negative for malaise/fatigue and weight loss.  HENT: Negative for congestion and  nosebleeds.   Respiratory: Negative for cough and shortness of breath.   Gastrointestinal: Negative for abdominal pain, blood in stool, diarrhea and melena.       Dysphagia with water  Genitourinary: Negative for hematuria.  Musculoskeletal: Positive for back pain (Spinal stenosis), joint pain and myalgias (Legs ache). Negative for falls.  Neurological: Positive for tingling (Radicular pain down both legs) and headaches. Negative for tremors.        He does have poor balance.  Psychiatric/Behavioral: Negative.     I have reviewed and (if needed) personally updated the patient's problem list, medications, allergies, past medical and surgical history, social and family history.   PAST MEDICAL HISTORY   Past Medical History:  Diagnosis Date  . CAD S/P percutaneous coronary angioplasty 2001   a) 2001 ([place]): PCI OM1 - now with ~65% ISR.;; b) NSTEMI 11/2017 - ost-prox RCA 75-65% (Overlapping ONYX DES 3.0 x 26 & 3.0 x 8 --> 3.5-3.3 mm). m-dRCA 95% (ONYX DES 2.75 x 18 mm -> 3.0 mm). Ost OM1 65% ISR (med Rx). dRCA-OstRDA (50%-25%)  . Essential (primary) hypertension 11/21/2017  . Hyperlipidemia 11/21/2017  . Neurogenic claudication due to lumbar spinal stenosis 02/06/2018  . NSTEMI (non-ST elevated myocardial infarction) ([place]) 11/21/2017   Multiple RCA lesions - 2 Overlapping DES Ost-Prox RCA & 1 in mid-distal RCA    PAST SURGICAL HISTORY   Past Surgical History:  Procedure Laterality Date  . CORONARY STENT INTERVENTION N/A 11/25/2017   Procedure: CORONARY STENT INTERVENTION;  Surgeon: Leonie Man, MD;  Location: [facility];  Service: Cardiovascular:   m-dRCA 95%  (DES PCI - 0%.  Resolute ONYX DES 2.75 x 18 mm - ~3.0 mm). Ost-proxRCA 75-65% (DES PCI-0%. 2 overlapping Resolute ONYX DES 3.0 x 26 & 3.0 x 8 mm --> 3.5-3.3 mm)  . CORONARY STENT INTERVENTION  2001   ([place]): PCI OM1  . LEA DOPPLERS  12/12/2017   Normal bilateral ABIs and TBI's.  Marland Kitchen LEFT HEART CATH AND CORONARY ANGIOGRAPHY N/A 11/25/2017   Procedure: LEFT HEART CATH AND CORONARY ANGIOGRAPHY;  Surgeon: Leonie Man, MD;  Location: [facility];  Service: Cardiovascular:  Ost-proxRCA 75&65%(DES PCI), m-dRCA 95% (DES PCI), dRCA 50%-25% ost rPDA. ostOM1 65% ISR (from 2001).   . LUMBAR LAMINECTOMY/DECOMPRESSION MICRODISCECTOMY Bilateral 06/13/2018   Procedure: Laminectomy and Foraminotomy - L1-L2 - L2-L3 - L3-L4 - bilateral;  Surgeon: Eustace Moore, MD;   Location: [facility];  Service: Neurosurgery;  Laterality: Bilateral;  . TRANSTHORACIC ECHOCARDIOGRAM  11/23/2017   Normal LV size and function.  EF 60 to 65%.  No RWMA.  GR 1 DD.  Mild RV dilation.   Cardiac cath images from March 2019  Intervention     MEDICATIONS/ALLERGIES   Current Meds  Medication Sig  . Aromatic Inhalants (VICKS VAPOR INHALER IN) Inhale 1 Inhaler into the lungs as needed (stuffy nose).  . cyanocobalamin (,VITAMIN B-12,) 1000 MCG/ML injection Inject 1,000 mcg into the muscle every 30 (thirty) days.  Marland Kitchen doxazosin (CARDURA) 8 MG tablet Take 8 mg by mouth daily.  Marland Kitchen ezetimibe (ZETIA) 10 MG tablet TAKE 1 TABLET(10 MG) BY MOUTH DAILY  . furosemide (LASIX) 20 MG tablet TAKE 1 TABLET(20 MG) BY MOUTH DAILY  . losartan (COZAAR) 50 MG tablet Take 1 tablet (50 mg total) by mouth daily.  . metoprolol tartrate (LOPRESSOR) 25 MG tablet Take 0.5 tablets (12.5 mg total) by mouth 2 (two) times daily.  . nitroGLYCERIN (NITROSTAT) 0.4 MG SL  tablet Place 1 tablet (0.4 mg total) under the tongue every 5 (five) minutes as needed for chest pain.  Marland Kitchen Potassium Chloride ER 20 MEQ TBCR TK 1/2 T PO QD WF  . potassium chloride SA (K-DUR,KLOR-CON) 20 MEQ tablet Take 1 tablet (20 mEq total) by mouth daily.  . prasugrel (EFFIENT) 10 MG TABS tablet TAKE 1 TABLET(10 MG) BY MOUTH DAILY  . [DISCONTINUED] rosuvastatin (CRESTOR) 40 MG tablet Take 1 tablet (40 mg total) by mouth daily.    Allergies  Allergen Reactions  . Plavix [Clopidogrel Bisulfate] Swelling  . Brilinta [Ticagrelor] Other (See Comments)     Developed a cough  . Enalapril     SOCIAL HISTORY/FAMILY HISTORY   Reviewed in Epic:  Pertinent findings: He has established a "PCP" through the [place] -> initially was planning on doing this simply to get his COVID-19 vaccine, lipids, take advantage of being able to get some of his medications through the [place].  OBJCTIVE -PE, EKG, labs   Wt Readings from Last 3 Encounters:  01/18/20 223 lb  (101.2 kg)  05/29/19 219 lb 6.4 oz (99.5 kg)  03/03/19 215 lb (97.5 kg)    Physical Exam: BP (!) 142/72   Pulse 66   Ht 5\' 10"  (1.778 m)   Wt 223 lb (101.2 kg)   SpO2 99%   BMI 32.00 kg/m  Physical Exam  Constitutional: He is oriented to person, place, and time. He appears well-developed and well-nourished. No distress.  Well-groomed.  Appears younger than stated age.  HENT:  Head: Normocephalic and atraumatic.  Neck: No hepatojugular reflux and no JVD present. Carotid bruit is not present.  Cardiovascular: Normal rate, regular rhythm, normal heart sounds and intact distal pulses.  No extrasystoles are present. PMI is not displaced. Exam reveals no gallop and no friction rub.  No murmur heard. Pulmonary/Chest: Effort normal and breath sounds normal. No respiratory distress. He has no wheezes.  Abdominal: Soft. Bowel sounds are normal. He exhibits no distension. There is no abdominal tenderness. There is no rebound.  No HSM  Musculoskeletal:        General: Edema (Trivial bilateral lower extremity edema) present. Normal range of motion.     Cervical back: Normal range of motion and neck supple.     Comments: Seems stiff, slow steady antalgic gait  Neurological: He is alert and oriented to person, place, and time.  Psychiatric: Judgment and thought content normal.  Seems a little bit anxious and nervous/high strung  Vitals reviewed.   Adult ECG Report  Rate: 66;  Rhythm: normal sinus rhythm and Normal axis, intervals and durations.;   Narrative Interpretation: Normal EKG  Recent Labs: From [place] dated 10/23/2019  Na+ 141, K+ 3.9, Cl- 107, HCO3-29, BUN 16, Cr 0.98, Glu 96, Ca2+ 8.4; AST 25, ALT 25, AlkP 73; albumin 3.8, protein 6.9.  CBC: W 4.86, H/H 13.7/41.3, Plt 164  TC 115, TG 66, HDL 51, LDLc 51; A1c 5.8; TSH 2.26  Lab Results  Component Value Date   CHOL 103 06/02/2019   HDL 44 06/02/2019   LDLCALC 45 06/02/2019   TRIG 65 06/02/2019   CHOLHDL 2.3 06/02/2019   Lab  Results  Component Value Date   CREATININE 0.92 06/02/2019   BUN 14 06/02/2019   NA 140 06/02/2019   K 4.2 06/02/2019   CL 103 06/02/2019   CO2 24 06/02/2019   No results found for: TSH  ASSESSMENT/PLAN    Problem List Items Addressed This Visit  Hyperlipidemia with target LDL less than 70 (Chronic)    Just had labs checked in February.  Pretty well controlled.  We finally back fully off and stopped rosuvastatin having converted to Zetia.  Need to continue to follow since we have switched from rosuvastatin to Zetia.  I suspect that he probably would need to have labs checked in about 3 months.  I have ordered them to be checked either here or at his PCP.       Relevant Orders   Hepatic function panel   Lipid panel   NSTEMI (non-ST elevated myocardial infarction) ([place]) (Chronic)    He is now 2 years out from his MI.  Doing very well with no further anginal type symptoms.  He basely had extensive (2 site) PCI to the RCA with medical management of moderate in-stent restenosis in the OM1.  No further anginal symptoms.  Seems to be tolerating the moderate disease in OM1.. Preserved echo on EF with no regional wall motion normalities.       Relevant Orders   EKG 12-Lead (Completed)   Hepatic function panel   Lipid panel   Essential (primary) hypertension (Chronic)    Blood pressure has been pretty well controlled.  He is on ARB and beta-blocker.  He does have some positional dizziness and I am leery of pushing too hard for blood pressure control.  The thought was that he was taking daily Lasix but he is really only taking it as needed.  Would not be too aggressive and would probably allow systolic blood pressures in the 130 to 140 mm range.      CAD S/P percutaneous coronary angioplasty - Primary (Chronic)    Doing well status post PCI.  Residual disease in the distal RCA-PDA as well as residual ISR in the OM. Tolerating Effient without any bleeding.  We had switched him  initially from Brilinta to Plavix which was not tolerated and eventually doing well on Eliquis.  Plan:   He is on low-dose beta-blocker-max tolerated dose along with moderate dose ARB.->  Blood pressures were probably tolerate increased dose of ARB, but with his tendency for bradycardia would not increase beta-blocker.  No longer on statin because of myalgias.  Is currently on Zetia. ->  Thankfully, by most recent labs, his LDL did not go up all that much with the change.  With extensive CAD, would continue Effient for now, but okay to interrupt for any procedures or surgeries.  -->  Recommendation will be stopped 9 days preop.  At the first sign of a significant bleed, would switch from Effient back to aspirin, but with extensive stent work, would like to keep on a more potent antiplatelet agent.      Relevant Orders   EKG 12-Lead (Completed)   Hepatic function panel   Lipid panel   Obesity (BMI 30.0-34.9) (Chronic)    Unfortunately, lost ground over the COVID-19 lockdown timeframe.  He is having a hard time getting back into some exercise.  I encouraged him to continue to try to do the best he can exercise.  Hopefully with pools opening up he can get back into doing water walking.      Relevant Orders   Lipid panel       COVID-19 Education: The signs and symptoms of COVID-19 were discussed with the patient and how to seek care for testing (follow up with PCP or arrange E-visit).   The importance of social distancing and COVID-19 vaccination was discussed today.  I spent a total of 4minutes with the patient. >  50% of the time was spent in direct patient consultation.  Additional time spent with chart review  / charting (studies, outside notes, etc): 8 Total Time: 30 min   Current medicines are reviewed at length with the patient today.  (+/- concerns) none  Notice: This dictation was prepared with Dragon dictation along with smaller phrase technology. Any transcriptional  errors that result from this process are unintentional and may not be corrected upon review.  Patient Instructions / Medication Changes & Studies & Tests Ordered   Patient Instructions  Medication Instructions:  No changes *If you need a refill on your cardiac medications before your next appointment, please call your pharmacy*   Lab Work: Lipid hepatic - in 3 months  Fasting   If you have labs (blood work) drawn today and your tests are completely normal, you will receive your results only by: Marland Kitchen MyChart Message (if you have MyChart) OR . A paper copy in the mail If you have any lab test that is abnormal or we need to change your treatment, we will call you to review the results.   Testing/Procedures: Not needed   Follow-Up: At [facility], you and your health needs are our priority.  As part of our continuing mission to provide you with exceptional heart care, we have created designated Provider Care Teams.  These Care Teams include your primary Cardiologist (physician) and Advanced Practice Providers (APPs -  Physician Assistants and Nurse Practitioners) who all work together to provide you with the care you need, when you need it.  We recommend signing up for the patient portal called "MyChart".  Sign up information is provided on this After Visit Summary.  MyChart is used to connect with patients for Virtual Visits (Telemedicine).  Patients are able to view lab/test results, encounter notes, upcoming appointments, etc.  Non-urgent messages can be sent to your provider as well.   To learn more about what you can do with MyChart, go to [email or web address].    Your next appointment:   6 month(s)  The format for your next appointment:   In Person  Provider:   Glenetta Hew, MD   Other Instructions n/a    Studies Ordered:   Orders Placed This Encounter  Procedures  . Hepatic function panel  . Lipid panel  . EKG 12-Lead     Glenetta Hew, M.D.,  M.S. Interventional Cardiologist    [address], [place] [id number]   Thank you for choosing Heartcare at [facility]!!

## 2020-01-24 ENCOUNTER — Other Ambulatory Visit: Payer: Self-pay | Admitting: Cardiology

## 2020-01-25 ENCOUNTER — Encounter: Payer: Self-pay | Admitting: Cardiology

## 2020-01-25 NOTE — Assessment & Plan Note (Signed)
Unfortunately, lost ground over the COVID-19 lockdown timeframe.  He is having a hard time getting back into some exercise.  I encouraged him to continue to try to do the best he can exercise.  Hopefully with pools opening up he can get back into doing water walking.

## 2020-01-25 NOTE — Assessment & Plan Note (Signed)
Blood pressure has been pretty well controlled.  He is on ARB and beta-blocker.  He does have some positional dizziness and I am leery of pushing too hard for blood pressure control.  The thought was that he was taking daily Lasix but he is really only taking it as needed.  Would not be too aggressive and would probably allow systolic blood pressures in the 130 to 140 mm range.

## 2020-01-25 NOTE — Assessment & Plan Note (Addendum)
He is now 2 years out from his MI.  Doing very well with no further anginal type symptoms.  He basely had extensive (2 site) PCI to the RCA with medical management of moderate in-stent restenosis in the OM1.  No further anginal symptoms.  Seems to be tolerating the moderate disease in OM1.. Preserved echo on EF with no regional wall motion normalities.

## 2020-01-25 NOTE — Assessment & Plan Note (Signed)
Just had labs checked in February.  Pretty well controlled.  We finally back fully off and stopped rosuvastatin having converted to Zetia.  Need to continue to follow since we have switched from rosuvastatin to Zetia.  I suspect that he probably would need to have labs checked in about 3 months.  I have ordered them to be checked either here or at his PCP.

## 2020-01-25 NOTE — Assessment & Plan Note (Signed)
Doing well status post PCI.  Residual disease in the distal RCA-PDA as well as residual ISR in the OM. Tolerating Effient without any bleeding.  We had switched him initially from Brilinta to Plavix which was not tolerated and eventually doing well on Eliquis.  Plan:   He is on low-dose beta-blocker-max tolerated dose along with moderate dose ARB.->  Blood pressures were probably tolerate increased dose of ARB, but with his tendency for bradycardia would not increase beta-blocker.  No longer on statin because of myalgias.  Is currently on Zetia. ->  Thankfully, by most recent labs, his LDL did not go up all that much with the change.  With extensive CAD, would continue Effient for now, but okay to interrupt for any procedures or surgeries.  -->  Recommendation will be stopped 9 days preop.  At the first sign of a significant bleed, would switch from Effient back to aspirin, but with extensive stent work, would like to keep on a more potent antiplatelet agent.

## 2020-02-08 DIAGNOSIS — E538 Deficiency of other specified B group vitamins: Secondary | ICD-10-CM | POA: Diagnosis not present

## 2020-04-11 DIAGNOSIS — R634 Abnormal weight loss: Secondary | ICD-10-CM | POA: Diagnosis not present

## 2020-04-11 DIAGNOSIS — M48062 Spinal stenosis, lumbar region with neurogenic claudication: Secondary | ICD-10-CM | POA: Diagnosis not present

## 2020-04-11 DIAGNOSIS — Z789 Other specified health status: Secondary | ICD-10-CM | POA: Diagnosis not present

## 2020-04-11 DIAGNOSIS — E782 Mixed hyperlipidemia: Secondary | ICD-10-CM | POA: Diagnosis not present

## 2020-04-11 DIAGNOSIS — Z131 Encounter for screening for diabetes mellitus: Secondary | ICD-10-CM | POA: Diagnosis not present

## 2020-04-11 DIAGNOSIS — I25119 Atherosclerotic heart disease of native coronary artery with unspecified angina pectoris: Secondary | ICD-10-CM | POA: Diagnosis not present

## 2020-04-11 DIAGNOSIS — R600 Localized edema: Secondary | ICD-10-CM | POA: Diagnosis not present

## 2020-04-11 DIAGNOSIS — E569 Vitamin deficiency, unspecified: Secondary | ICD-10-CM | POA: Diagnosis not present

## 2020-04-11 DIAGNOSIS — D649 Anemia, unspecified: Secondary | ICD-10-CM | POA: Diagnosis not present

## 2020-04-11 DIAGNOSIS — I10 Essential (primary) hypertension: Secondary | ICD-10-CM | POA: Diagnosis not present

## 2020-04-11 DIAGNOSIS — R61 Generalized hyperhidrosis: Secondary | ICD-10-CM | POA: Diagnosis not present

## 2020-04-11 DIAGNOSIS — Z125 Encounter for screening for malignant neoplasm of prostate: Secondary | ICD-10-CM | POA: Diagnosis not present

## 2020-04-12 DIAGNOSIS — E538 Deficiency of other specified B group vitamins: Secondary | ICD-10-CM | POA: Diagnosis not present

## 2020-04-21 DIAGNOSIS — E669 Obesity, unspecified: Secondary | ICD-10-CM | POA: Diagnosis not present

## 2020-04-21 DIAGNOSIS — I214 Non-ST elevation (NSTEMI) myocardial infarction: Secondary | ICD-10-CM | POA: Diagnosis not present

## 2020-04-21 DIAGNOSIS — Z9861 Coronary angioplasty status: Secondary | ICD-10-CM | POA: Diagnosis not present

## 2020-04-21 DIAGNOSIS — E785 Hyperlipidemia, unspecified: Secondary | ICD-10-CM | POA: Diagnosis not present

## 2020-04-21 DIAGNOSIS — I2510 Atherosclerotic heart disease of native coronary artery without angina pectoris: Secondary | ICD-10-CM | POA: Diagnosis not present

## 2020-04-22 LAB — LIPID PANEL
Chol/HDL Ratio: 4.3 ratio (ref 0.0–5.0)
Cholesterol, Total: 178 mg/dL (ref 100–199)
HDL: 41 mg/dL (ref >39)
LDL Chol Calc (NIH): 123 mg/dL — ABNORMAL HIGH (ref 0–99)
Triglycerides: 75 mg/dL (ref 0–149)
VLDL Cholesterol Cal: 14 mg/dL (ref 5–40)

## 2020-04-22 LAB — HEPATIC FUNCTION PANEL
ALT: 6 IU/L (ref 0–44)
AST: 16 IU/L (ref 0–40)
Albumin: 3.8 g/dL (ref 3.6–4.6)
Alkaline Phosphatase: 81 IU/L (ref 48–121)
Bilirubin Total: 0.5 mg/dL (ref 0.0–1.2)
Bilirubin, Direct: 0.15 mg/dL (ref 0.00–0.40)
Total Protein: 6.5 g/dL (ref 6.0–8.5)

## 2020-05-03 ENCOUNTER — Telehealth: Payer: Self-pay | Admitting: *Deleted

## 2020-05-03 DIAGNOSIS — E785 Hyperlipidemia, unspecified: Secondary | ICD-10-CM

## 2020-05-03 DIAGNOSIS — F1921 Other psychoactive substance dependence, in remission: Secondary | ICD-10-CM

## 2020-05-03 DIAGNOSIS — Z79899 Other long term (current) drug therapy: Secondary | ICD-10-CM

## 2020-05-03 DIAGNOSIS — I2510 Atherosclerotic heart disease of native coronary artery without angina pectoris: Secondary | ICD-10-CM

## 2020-05-03 DIAGNOSIS — Z9861 Coronary angioplasty status: Secondary | ICD-10-CM

## 2020-05-03 MED ORDER — ROSUVASTATIN CALCIUM 10 MG PO TABS
10.0000 mg | ORAL_TABLET | Freq: Every day | ORAL | 6 refills | Status: DC
Start: 2020-05-03 — End: 2020-07-21

## 2020-05-03 NOTE — Telephone Encounter (Signed)
-----   Message from Leonie Man, MD sent at 04/27/2020  6:19 PM EDT ----- Unfortunate increase in bad cholesterol up to 123 from 45.  I would like to see if we can probably go back to taking 10 mg of rosuvastatin along with his Zetia.  We can then reassess in November.  Glenetta Hew, MD

## 2020-05-03 NOTE — Telephone Encounter (Signed)
The patient has been notified of the result and verbalized understanding.  All questions (if any) were answered.  Patient states he was on medication prior - it cause muscle aches . But he states he will try it a gain to see what happens  And will recheck in November  medication  Rosuvastatin 10 mg  E-sent to pharmacy.  labslip was mailed Raiford Simmonds, RN 05/03/2020 1:47 PM

## 2020-05-07 ENCOUNTER — Other Ambulatory Visit: Payer: Self-pay | Admitting: Cardiology

## 2020-05-12 ENCOUNTER — Other Ambulatory Visit: Payer: Self-pay | Admitting: *Deleted

## 2020-05-12 DIAGNOSIS — Z9861 Coronary angioplasty status: Secondary | ICD-10-CM

## 2020-05-12 DIAGNOSIS — Z79899 Other long term (current) drug therapy: Secondary | ICD-10-CM

## 2020-05-12 DIAGNOSIS — E785 Hyperlipidemia, unspecified: Secondary | ICD-10-CM

## 2020-05-13 DIAGNOSIS — E538 Deficiency of other specified B group vitamins: Secondary | ICD-10-CM | POA: Diagnosis not present

## 2020-05-19 DIAGNOSIS — R600 Localized edema: Secondary | ICD-10-CM | POA: Diagnosis not present

## 2020-05-19 DIAGNOSIS — E569 Vitamin deficiency, unspecified: Secondary | ICD-10-CM | POA: Diagnosis not present

## 2020-05-19 DIAGNOSIS — I25119 Atherosclerotic heart disease of native coronary artery with unspecified angina pectoris: Secondary | ICD-10-CM | POA: Diagnosis not present

## 2020-05-19 DIAGNOSIS — R61 Generalized hyperhidrosis: Secondary | ICD-10-CM | POA: Diagnosis not present

## 2020-05-19 DIAGNOSIS — E559 Vitamin D deficiency, unspecified: Secondary | ICD-10-CM | POA: Diagnosis not present

## 2020-05-19 DIAGNOSIS — Z789 Other specified health status: Secondary | ICD-10-CM | POA: Diagnosis not present

## 2020-05-19 DIAGNOSIS — E782 Mixed hyperlipidemia: Secondary | ICD-10-CM | POA: Diagnosis not present

## 2020-05-19 DIAGNOSIS — D649 Anemia, unspecified: Secondary | ICD-10-CM | POA: Diagnosis not present

## 2020-05-19 DIAGNOSIS — Z23 Encounter for immunization: Secondary | ICD-10-CM | POA: Diagnosis not present

## 2020-05-19 DIAGNOSIS — M48062 Spinal stenosis, lumbar region with neurogenic claudication: Secondary | ICD-10-CM | POA: Diagnosis not present

## 2020-05-19 DIAGNOSIS — I10 Essential (primary) hypertension: Secondary | ICD-10-CM | POA: Diagnosis not present

## 2020-06-02 DIAGNOSIS — I25119 Atherosclerotic heart disease of native coronary artery with unspecified angina pectoris: Secondary | ICD-10-CM | POA: Diagnosis not present

## 2020-06-02 HISTORY — PX: TRANSTHORACIC ECHOCARDIOGRAM: SHX275

## 2020-06-13 DIAGNOSIS — E538 Deficiency of other specified B group vitamins: Secondary | ICD-10-CM | POA: Diagnosis not present

## 2020-06-15 DIAGNOSIS — R61 Generalized hyperhidrosis: Secondary | ICD-10-CM | POA: Diagnosis not present

## 2020-06-15 DIAGNOSIS — Z789 Other specified health status: Secondary | ICD-10-CM | POA: Diagnosis not present

## 2020-06-15 DIAGNOSIS — D8481 Immunodeficiency due to conditions classified elsewhere: Secondary | ICD-10-CM | POA: Diagnosis not present

## 2020-06-15 DIAGNOSIS — M48062 Spinal stenosis, lumbar region with neurogenic claudication: Secondary | ICD-10-CM | POA: Diagnosis not present

## 2020-06-15 DIAGNOSIS — R600 Localized edema: Secondary | ICD-10-CM | POA: Diagnosis not present

## 2020-06-15 DIAGNOSIS — E46 Unspecified protein-calorie malnutrition: Secondary | ICD-10-CM | POA: Diagnosis not present

## 2020-06-15 DIAGNOSIS — I25119 Atherosclerotic heart disease of native coronary artery with unspecified angina pectoris: Secondary | ICD-10-CM | POA: Diagnosis not present

## 2020-06-15 DIAGNOSIS — E782 Mixed hyperlipidemia: Secondary | ICD-10-CM | POA: Diagnosis not present

## 2020-06-15 DIAGNOSIS — B351 Tinea unguium: Secondary | ICD-10-CM | POA: Diagnosis not present

## 2020-07-11 DIAGNOSIS — E46 Unspecified protein-calorie malnutrition: Secondary | ICD-10-CM | POA: Diagnosis not present

## 2020-07-11 DIAGNOSIS — R61 Generalized hyperhidrosis: Secondary | ICD-10-CM | POA: Diagnosis not present

## 2020-07-11 DIAGNOSIS — R600 Localized edema: Secondary | ICD-10-CM | POA: Diagnosis not present

## 2020-07-11 DIAGNOSIS — I25119 Atherosclerotic heart disease of native coronary artery with unspecified angina pectoris: Secondary | ICD-10-CM | POA: Diagnosis not present

## 2020-07-11 DIAGNOSIS — D8481 Immunodeficiency due to conditions classified elsewhere: Secondary | ICD-10-CM | POA: Diagnosis not present

## 2020-07-11 DIAGNOSIS — D649 Anemia, unspecified: Secondary | ICD-10-CM | POA: Diagnosis not present

## 2020-07-11 DIAGNOSIS — J90 Pleural effusion, not elsewhere classified: Secondary | ICD-10-CM | POA: Diagnosis not present

## 2020-07-11 DIAGNOSIS — I10 Essential (primary) hypertension: Secondary | ICD-10-CM | POA: Diagnosis not present

## 2020-07-11 DIAGNOSIS — E782 Mixed hyperlipidemia: Secondary | ICD-10-CM | POA: Diagnosis not present

## 2020-07-11 DIAGNOSIS — I2510 Atherosclerotic heart disease of native coronary artery without angina pectoris: Secondary | ICD-10-CM | POA: Diagnosis not present

## 2020-07-11 DIAGNOSIS — R634 Abnormal weight loss: Secondary | ICD-10-CM | POA: Diagnosis not present

## 2020-07-12 DIAGNOSIS — E785 Hyperlipidemia, unspecified: Secondary | ICD-10-CM | POA: Diagnosis not present

## 2020-07-12 DIAGNOSIS — Z9861 Coronary angioplasty status: Secondary | ICD-10-CM | POA: Diagnosis not present

## 2020-07-12 DIAGNOSIS — Z79899 Other long term (current) drug therapy: Secondary | ICD-10-CM | POA: Diagnosis not present

## 2020-07-12 DIAGNOSIS — I2510 Atherosclerotic heart disease of native coronary artery without angina pectoris: Secondary | ICD-10-CM | POA: Diagnosis not present

## 2020-07-12 LAB — LIPID PANEL
Chol/HDL Ratio: 2.8 ratio (ref 0.0–5.0)
Cholesterol, Total: 156 mg/dL (ref 100–199)
HDL: 56 mg/dL (ref >39)
LDL Chol Calc (NIH): 87 mg/dL (ref 0–99)
Triglycerides: 68 mg/dL (ref 0–149)
VLDL Cholesterol Cal: 13 mg/dL (ref 5–40)

## 2020-07-12 LAB — HEPATIC FUNCTION PANEL
ALT: 16 IU/L (ref 0–44)
AST: 22 IU/L (ref 0–40)
Albumin: 4.2 g/dL (ref 3.6–4.6)
Alkaline Phosphatase: 84 IU/L (ref 44–121)
Bilirubin Total: 0.3 mg/dL (ref 0.0–1.2)
Bilirubin, Direct: 0.1 mg/dL (ref 0.00–0.40)
Total Protein: 6.8 g/dL (ref 6.0–8.5)

## 2020-07-13 DIAGNOSIS — E569 Vitamin deficiency, unspecified: Secondary | ICD-10-CM | POA: Diagnosis not present

## 2020-07-21 ENCOUNTER — Ambulatory Visit (INDEPENDENT_AMBULATORY_CARE_PROVIDER_SITE_OTHER): Payer: Medicare Other | Admitting: Cardiology

## 2020-07-21 ENCOUNTER — Other Ambulatory Visit: Payer: Self-pay

## 2020-07-21 ENCOUNTER — Encounter: Payer: Self-pay | Admitting: Cardiology

## 2020-07-21 VITALS — BP 140/82 | HR 71 | Temp 97.7°F | Ht 70.0 in | Wt 218.8 lb

## 2020-07-21 DIAGNOSIS — I2510 Atherosclerotic heart disease of native coronary artery without angina pectoris: Secondary | ICD-10-CM

## 2020-07-21 DIAGNOSIS — E785 Hyperlipidemia, unspecified: Secondary | ICD-10-CM

## 2020-07-21 DIAGNOSIS — I10 Essential (primary) hypertension: Secondary | ICD-10-CM

## 2020-07-21 DIAGNOSIS — E669 Obesity, unspecified: Secondary | ICD-10-CM

## 2020-07-21 DIAGNOSIS — Z9861 Coronary angioplasty status: Secondary | ICD-10-CM | POA: Diagnosis not present

## 2020-07-21 MED ORDER — ROSUVASTATIN CALCIUM 20 MG PO TABS
20.0000 mg | ORAL_TABLET | Freq: Every day | ORAL | 3 refills | Status: DC
Start: 2020-07-21 — End: 2022-10-19

## 2020-07-21 NOTE — Progress Notes (Signed)
Primary Care Provider: Einar Pheasant, DO Cardiologist: Glenetta Hew, MD Electrophysiologist: None  Clinic Note: Chief Complaint  Patient presents with  . Follow-up    6 months  . Coronary Artery Disease    No angina  . Hyperlipidemia    No real change in muscle stiffness with statin holiday.   HPI:    Noah Scott is a 84 y.o. male with a PMH notable for CAD having PCI (non-STEMI) along with HTN and HLD below who presents today for 27-month follow-up.   NSTEMI 11/21/2017 (He noted that his "ANGINA" equivalent is "severe heartburn radiating to his throat") --> Cath-PCI m-dRCA (DES x 1) & Ost-Prox RCA (2 overlapping DES) -- Difficult, complicated PCI of 3 separate lesions in the RCA requiring use of extra-support buddy wire.; Mod-Severe ISR in OM1 (stent from 2001 - med management pending Back Surgery).  Problem List Items Addressed This Visit    Hyperlipidemia with target LDL less than 70 (Chronic)   CAD S/P percutaneous coronary angioplasty - Primary (Chronic)     Noah Scott was last seen on Jan 18, 2020.  He is doing fairly well.  Most notable issue was his back pain, but also his knee.  [facility] ordered lower extremity Dopplers for possible claudication, but he was not really noticing true symptoms of claudication. ->  We have stopped Crestor and Zetia with hopes that this will improve his pulmonologist and it helps somewhat a lot.  No chest pain or anginal equivalent symptoms.  No CHF symptoms with no extra dose of furosemide.  Recent Hospitalizations:   I don't have a PCP notes, but he has had an echocardiogram and a chest x-ray done in the last couple months ordered by his new PCP.  Apparently has some cough and night sweats.  Chest x-ray showed pleural effusion.  Echo was normal.  Reviewed  CV studies:    The following studies were reviewed today: (if available, images/films reviewed: From Epic Chart or Care Everywhere) . TTE 06/02/2020 [facility]): Normal LV size and  function.  EF 60 and 65%.  Normal RV size and function.  Aortic sclerosis with no stenosis.  No AI.  Mildly dilated ascending aorta.  CVP estimated 5 mm. . CXR 07/12/2020: New mild Pleural thickening or Loculated pleural effusion.    Interval History:   Noah Scott returns here today overall doing pretty well.  He says is able to go up at least 3 or 4 flights of steps without having any chest tightness or dyspnea. No real complaints of PND, orthopnea, or edema.  Not really taking any additional dose of Lasix.  He recently restarted his statin -> no real changes.  His activities mostly limited by back pain and balance issues.  He is doing leg exercises and trying to walk as much as possible to help this out.  CV Review of Symptoms (Summary): no chest pain or dyspnea on exertion positive for - Well-controlled edema; with increased level of activity, exertional dyspnea improved. negative for - irregular heartbeat, orthopnea, palpitations, paroxysmal nocturnal dyspnea, rapid heart rate, shortness of breath or Syncope/near syncope or TIA/amaurosis fugax, claudication  The patient does not have symptoms concerning for COVID-19 infection (fever, chills, cough, or new shortness of breath).   REVIEWED OF SYSTEMS   Review of Systems  Constitutional: Positive for weight loss (wgt going up & down). Negative for malaise/fatigue.  HENT: Negative for congestion and nosebleeds.   Respiratory: Negative for shortness of breath.  No further cough  Gastrointestinal: Negative for abdominal pain, blood in stool and melena.  Genitourinary: Negative for hematuria.  Musculoskeletal: Positive for back pain (spinal stenosis ) and joint pain.  Neurological: Positive for tingling (occasional radicular pain from spinal stenosis;) and weakness (every now & then, legs give out on him). Negative for dizziness (poor balance).   I have reviewed and (if needed) personally updated the patient's problem list,  medications, allergies, past medical and surgical history, social and family history.   PAST MEDICAL HISTORY   Past Medical History:  Diagnosis Date  . CAD S/P percutaneous coronary angioplasty 2001   a) 2001 ([place]): PCI OM1 - now with ~65% ISR.;; b) NSTEMI 11/2017 - ost-prox RCA 75-65% (Overlapping ONYX DES 3.0 x 26 & 3.0 x 8 --> 3.5-3.3 mm). m-dRCA 95% (ONYX DES 2.75 x 18 mm -> 3.0 mm). Ost OM1 65% ISR (med Rx). dRCA-OstRDA (50%-25%)  . Essential (primary) hypertension 11/21/2017  . Hyperlipidemia 11/21/2017  . Neurogenic claudication due to lumbar spinal stenosis 02/06/2018  . NSTEMI (non-ST elevated myocardial infarction) ([place]) 11/21/2017   Multiple RCA lesions - 2 Overlapping DES Ost-Prox RCA & 1 in mid-distal RCA    PAST SURGICAL HISTORY   Past Surgical History:  Procedure Laterality Date  . CORONARY STENT INTERVENTION N/A 11/25/2017   Procedure: CORONARY STENT INTERVENTION;  Surgeon: Leonie Man, MD;  Location: [facility];  Service: Cardiovascular:   m-dRCA 95%  (DES PCI - 0%.  Resolute ONYX DES 2.75 x 18 mm - ~3.0 mm). Ost-proxRCA 75-65% (DES PCI-0%. 2 overlapping Resolute ONYX DES 3.0 x 26 & 3.0 x 8 mm --> 3.5-3.3 mm)  . CORONARY STENT INTERVENTION  2001   ([place]): PCI OM1  . LEA DOPPLERS  12/12/2017   Normal bilateral ABIs and TBI's.  Marland Kitchen LEFT HEART CATH AND CORONARY ANGIOGRAPHY N/A 11/25/2017   Procedure: LEFT HEART CATH AND CORONARY ANGIOGRAPHY;  Surgeon: Leonie Man, MD;  Location: [facility];  Service: Cardiovascular:  Ost-proxRCA 75&65%(DES PCI), m-dRCA 95% (DES PCI), dRCA 50%-25% ost rPDA. ostOM1 65% ISR (from 2001).   . LUMBAR LAMINECTOMY/DECOMPRESSION MICRODISCECTOMY Bilateral 06/13/2018   Procedure: Laminectomy and Foraminotomy - L1-L2 - L2-L3 - L3-L4 - bilateral;  Surgeon: Eustace Moore, MD;  Location: [facility];  Service: Neurosurgery;  Laterality: Bilateral;  . TRANSTHORACIC ECHOCARDIOGRAM  11/23/2017   Normal LV size and function.  EF 60  to 65%.  No RWMA.  GR 1 DD.  Mild RV dilation.   Cardiac cath images from March 2019  Intervention     There is no immunization history on file for this patient.  Moderna - Vaccine x 2 - waiting on [place].  MEDICATIONS/ALLERGIES   Current Meds  Medication Sig  . Aromatic Inhalants (VICKS VAPOR INHALER IN) Inhale 1 Inhaler into the lungs as needed (stuffy nose).  . cyanocobalamin (,VITAMIN B-12,) 1000 MCG/ML injection Inject 1,000 mcg into the muscle every 30 (thirty) days.  Marland Kitchen doxazosin (CARDURA) 8 MG tablet Take 8 mg by mouth daily.  Marland Kitchen ezetimibe (ZETIA) 10 MG tablet TAKE 1 TABLET(10 MG) BY MOUTH DAILY  . furosemide (LASIX) 20 MG tablet TAKE 1 TABLET(20 MG) BY MOUTH DAILY  . losartan (COZAAR) 50 MG tablet Take 1 tablet (50 mg total) by mouth daily.  . metoprolol tartrate (LOPRESSOR) 25 MG tablet Take 0.5 tablets (12.5 mg total) by mouth 2 (two) times daily.  . nitroGLYCERIN (NITROSTAT) 0.4 MG SL tablet Place 1 tablet (0.4 mg total)  under the tongue every 5 (five) minutes as needed for chest pain.  Marland Kitchen Potassium Chloride ER 20 MEQ TBCR TK 1/2 T PO QD WF  . potassium chloride SA (K-DUR,KLOR-CON) 20 MEQ tablet Take 1 tablet (20 mEq total) by mouth daily.  . prasugrel (EFFIENT) 10 MG TABS tablet TAKE 1 TABLET(10 MG) BY MOUTH DAILY  . [DISCONTINUED] rosuvastatin (CRESTOR) 10 MG tablet Take 1 tablet (10 mg total) by mouth daily.    Allergies  Allergen Reactions  . Plavix [Clopidogrel Bisulfate] Swelling  . Brilinta [Ticagrelor] Other (See Comments)     Developed a cough  . Enalapril     SOCIAL HISTORY/FAMILY HISTORY   Reviewed in Epic:  Pertinent findings: changed PCP   OBJCTIVE -PE, EKG, labs   Wt Readings from Last 3 Encounters:  07/21/20 218 lb 12.8 oz (99.2 kg)  01/18/20 223 lb (101.2 kg)  05/29/19 219 lb 6.4 oz (99.5 kg)    Physical Exam: BP 140/82   Pulse 71   Temp 97.7 F (36.5 C)   Ht 5\' 10"  (1.778 m)   Wt 218 lb 12.8 oz (99.2 kg)   SpO2 97%   BMI 31.39  kg/m  Physical Exam Constitutional:      General: He is not in acute distress.    Appearance: Normal appearance. He is obese. He is not ill-appearing or toxic-appearing.     Comments: Well-groomed.  Well-nourished  HENT:     Head: Normocephalic and atraumatic.  Neck:     Vascular: No carotid bruit.  Cardiovascular:     Rate and Rhythm: Normal rate and regular rhythm.  No extrasystoles are present.    Chest Wall: PMI is not displaced.     Pulses: Normal pulses.     Heart sounds: Normal heart sounds. No murmur heard.  No friction rub. No gallop.   Pulmonary:     Effort: Pulmonary effort is normal. No respiratory distress.     Breath sounds: Normal breath sounds. No rales.     Comments: No dullness to percussion. Chest:     Chest wall: No tenderness.  Musculoskeletal:        General: Swelling (Trivial bilateral) present. Normal range of motion.     Cervical back: Normal range of motion and neck supple.     Comments: Walks with a somewhat stiff rigid gait, wide footed with poor balance  Skin:    General: Skin is warm and dry.  Neurological:     General: No focal deficit present.     Mental Status: He is alert and oriented to person, place, and time. Mental status is at baseline.  Psychiatric:        Mood and Affect: Mood normal.        Behavior: Behavior normal.        Thought Content: Thought content normal.        Judgment: Judgment normal.     Adult ECG Report Not checked  Recent Labs:    Lab Results  Component Value Date   CHOL 156 07/12/2020   HDL 56 07/12/2020   LDLCALC 87 07/12/2020   TRIG 68 07/12/2020   CHOLHDL 2.8 07/12/2020   Lab Results  Component Value Date   CREATININE 0.92 06/02/2019   BUN 14 06/02/2019   NA 140 06/02/2019   K 4.2 06/02/2019   CL 103 06/02/2019   CO2 24 06/02/2019   No results found for: TSH  ASSESSMENT/PLAN    Problem List Items Addressed This Visit  Hyperlipidemia with target LDL less than 70 (Chronic)    LDL of 87.   Not currently on rosuvastatin.  We will restart rosuvastatin at 20 mg along with Zetia. -> If he is unable to tolerate rosuvastatin, would probably start with adding Nexletol and consider CVRR referral for PCSK9 inhibitor.  Reassess labs in 3 months.        Relevant Medications   rosuvastatin (CRESTOR) 20 MG tablet   Other Relevant Orders   Hepatic function panel   Lipid panel   Essential (primary) hypertension (Chronic)    Blood pressures have been up and down.  A little bit high today, but he does indicate that his back is bothering him some.  I would not like his pressure began to much higher than they are today, but this is in reasonable range for him dizziness.  We are allowing mild permissive hypertension.  If his pressures do increase to an average greater than 948 mmHg systolic, would probably increase his losartan to 100 mg.      Relevant Medications   rosuvastatin (CRESTOR) 20 MG tablet   CAD S/P percutaneous coronary angioplasty - Primary (Chronic)    Doing well post PCI with no further angina.  He does have residual disease in the distal RCA and PDA as well as residual ISR in the OM stent.   We will continue to treat this medically.  No bleeding issues on Effient.  Plan:  Continue low-dose Lopressor.  With his fatigue issues we have chosen not to increase this dose.  On losartan 50 mg daily, I have asked that he follow his blood pressures, may want to increase to 1 mg.  He is on Zetia, but we have held his statin -> plan will be to restart statin at 20 mg daily.  Since he does have extensive CAD, we are continuing monotherapy on Effient-> he did poorly on Brilinta and Plavix.  At this point we are beyond data driven use.  At follow-up visit I will switch him back to 81 mg aspirin  Okay to hold Effient preop for surgeries-would be 9 days..  If this is required prior to next visit, we will simply just stop Effient and begin 81 mg aspirin.  We will discontinue  Effient and go back to aspirin 81 mg next visit.      Relevant Medications   rosuvastatin (CRESTOR) 20 MG tablet   Other Relevant Orders   Hepatic function panel   Lipid panel   Obesity (BMI 30.0-34.9) (Chronic)    I encouraged him to keep walking, especially spinal stenosis it is important for him to maintain his normal activity.        COVID-19 Education: The signs and symptoms of COVID-19 were discussed with the patient and how to seek care for testing (follow up with PCP or arrange E-visit).   The importance of social distancing and COVID-19 vaccination was discussed today. 2  Min --> he had some chills and sweats shortly after having his vaccine booster.  I have chest x-ray at PCPs office.-Looked okay. The patient is practicing social distancing & Masking.   I spent a total of 50minutes with the patient spent in direct patient consultation.  -> We talked about his blood pressure and lipids-we also discussed how long he would not be on Effient..  Lots of questions answered.  Prolonged visit. Additional time spent with chart review  / charting (studies, outside notes, etc): 12 Total Time: 42 min  Current medicines are reviewed  at length with the patient today.  (+/- concerns) n/a  This visit occurred during the SARS-CoV-2 public health emergency.  Safety protocols were in place, including screening questions prior to the visit, additional usage of staff PPE, and extensive cleaning of exam room while observing appropriate contact time as indicated for disinfecting solutions.  Notice: This dictation was prepared with Dragon dictation along with smaller phrase technology. Any transcriptional errors that result from this process are unintentional and may not be corrected upon review.  Patient Instructions / Medication Changes & Studies & Tests Ordered   Patient Instructions  Medication Instructions:  Increase to 20 mg Rosuvastatin ( Crestor)  Daily  *If you need a refill on your  cardiac medications before your next appointment, please call your pharmacy*   Lab Work: Lipid Hepatic  Feb 2022 fasting  If you have labs (blood work) drawn today and your tests are completely normal, you will receive your results only by: Marland Kitchen MyChart Message (if you have MyChart) OR . A paper copy in the mail If you have any lab test that is abnormal or we need to change your treatment, we will call you to review the results.   Testing/Procedures: Not needed   Follow-Up: At [facility], you and your health needs are our priority.  As part of our continuing mission to provide you with exceptional heart care, we have created designated Provider Care Teams.  These Care Teams include your primary Cardiologist (physician) and Advanced Practice Providers (APPs -  Physician Assistants and Nurse Practitioners) who all work together to provide you with the care you need, when you need it.  We recommend signing up for the patient portal called "MyChart".  Sign up information is provided on this After Visit Summary.  MyChart is used to connect with patients for Virtual Visits (Telemedicine).  Patients are able to view lab/test results, encounter notes, upcoming appointments, etc.  Non-urgent messages can be sent to your provider as well.   To learn more about what you can do with MyChart, go to [email or web address].    Your next appointment:   6 month(s)  The format for your next appointment:   In Person  Provider:   Glenetta Hew, MD   Other Instructions Keep staying active and exercising.  Studies Ordered:   Orders Placed This Encounter  Procedures  . Hepatic function panel  . Lipid panel     Glenetta Hew, M.D., M.S. Interventional Cardiologist   Pager # [phone] Phone # [phone] [address] [place], [place] [address]   Thank you for choosing [facility]!!

## 2020-07-21 NOTE — Patient Instructions (Addendum)
Medication Instructions:  Increase to 20 mg Rosuvastatin ( Crestor)  Daily  *If you need a refill on your cardiac medications before your next appointment, please call your pharmacy*   Lab Work: Lipid Hepatic  Feb 2022 fasting  If you have labs (blood work) drawn today and your tests are completely normal, you will receive your results only by:  [place] (if you have MyChart) OR  A paper copy in the mail If you have any lab test that is abnormal or we need to change your treatment, we will call you to review the results.   Testing/Procedures: Not needed   Follow-Up: At [facility], you and your health needs are our priority.  As part of our continuing mission to provide you with exceptional heart care, we have created designated Provider Care Teams.  These Care Teams include your primary Cardiologist (physician) and Advanced Practice Providers (APPs -  Physician Assistants and Nurse Practitioners) who all work together to provide you with the care you need, when you need it.  We recommend signing up for the patient portal called "MyChart".  Sign up information is provided on this After Visit Summary.  MyChart is used to connect with patients for Virtual Visits (Telemedicine).  Patients are able to view lab/test results, encounter notes, upcoming appointments, etc.  Non-urgent messages can be sent to your provider as well.   To learn more about what you can do with MyChart, go to [email or web address].    Your next appointment:   6 month(s)  The format for your next appointment:   In Person  Provider:   Glenetta Hew, MD   Other Instructions Keep staying active and exercising.

## 2020-07-31 ENCOUNTER — Encounter: Payer: Self-pay | Admitting: Cardiology

## 2020-07-31 NOTE — Assessment & Plan Note (Addendum)
Doing well post PCI with no further angina.  He does have residual disease in the distal RCA and PDA as well as residual ISR in the OM stent.   We will continue to treat this medically.  No bleeding issues on Effient.  Plan:  Continue low-dose Lopressor.  With his fatigue issues we have chosen not to increase this dose.  On losartan 50 mg daily, I have asked that he follow his blood pressures, may want to increase to 1 mg.  He is on Zetia, but we have held his statin -> plan will be to restart statin at 20 mg daily.  Since he does have extensive CAD, we are continuing monotherapy on Effient-> he did poorly on Brilinta and Plavix.  At this point we are beyond data driven use.  At follow-up visit I will switch him back to 81 mg aspirin  Okay to hold Effient preop for surgeries-would be 9 days..  If this is required prior to next visit, we will simply just stop Effient and begin 81 mg aspirin.  We will discontinue Effient and go back to aspirin 81 mg next visit.

## 2020-07-31 NOTE — Assessment & Plan Note (Signed)
Blood pressures have been up and down.  A little bit high today, but he does indicate that his back is bothering him some.  I would not like his pressure began to much higher than they are today, but this is in reasonable range for him dizziness.  We are allowing mild permissive hypertension.  If his pressures do increase to an average greater than 838 mmHg systolic, would probably increase his losartan to 100 mg.

## 2020-07-31 NOTE — Assessment & Plan Note (Signed)
I encouraged him to keep walking, especially spinal stenosis it is important for him to maintain his normal activity.

## 2020-07-31 NOTE — Assessment & Plan Note (Signed)
LDL of 87.  Not currently on rosuvastatin.  We will restart rosuvastatin at 20 mg along with Zetia. -> If he is unable to tolerate rosuvastatin, would probably start with adding Nexletol and consider CVRR referral for PCSK9 inhibitor.  Reassess labs in 3 months.

## 2020-08-02 DIAGNOSIS — R600 Localized edema: Secondary | ICD-10-CM | POA: Diagnosis not present

## 2020-08-09 ENCOUNTER — Other Ambulatory Visit: Payer: Self-pay | Admitting: Cardiology

## 2020-08-09 ENCOUNTER — Telehealth: Payer: Self-pay | Admitting: Cardiology

## 2020-08-09 NOTE — Telephone Encounter (Signed)
The patient has been made aware that Dr. Ellyn Hack has him on 20 mg once daily of the Rosuvastatin. He received an old prescription from a different doctor. He has been made aware to split the 40 mg tablet in half.

## 2020-08-09 NOTE — Telephone Encounter (Signed)
Pt c/o medication issue:  1. Name of Medication: rosuvastatin (CRESTOR) 20 MG tablet  2. How are you currently taking this medication (dosage and times per day)? As directed  3. Are you having a reaction (difficulty breathing--STAT)? no  4. What is your medication issue? Patient states that he got a 90 day supply of 40mg . He wants to know if he is supposed to be taking the 40mg  or if he is supposed to be taking the 20mg  like he thought was the plan.

## 2020-08-09 NOTE — Telephone Encounter (Signed)
*  STAT* If patient is at the pharmacy, call can be transferred to refill team.   1. Which medications need to be refilled? (please list name of each medication and dose if known) prasugrel (EFFIENT) 10 MG TABS tablet  2. Which pharmacy/location (including street and city if local pharmacy) is medication to be sent to? [id number] - [place], [place] - [address]  3. Do they need a 30 day or 90 day supply? [place]

## 2020-08-10 MED ORDER — PRASUGREL HCL 10 MG PO TABS
10.0000 mg | ORAL_TABLET | Freq: Every day | ORAL | 2 refills | Status: DC
Start: 2020-08-10 — End: 2021-05-19

## 2020-09-20 ENCOUNTER — Encounter: Payer: Self-pay | Admitting: Cardiology

## 2020-09-20 ENCOUNTER — Telehealth: Payer: Self-pay | Admitting: Cardiology

## 2020-09-20 ENCOUNTER — Ambulatory Visit (INDEPENDENT_AMBULATORY_CARE_PROVIDER_SITE_OTHER): Payer: Medicare Other | Admitting: Cardiology

## 2020-09-20 ENCOUNTER — Other Ambulatory Visit: Payer: Self-pay

## 2020-09-20 ENCOUNTER — Ambulatory Visit (INDEPENDENT_AMBULATORY_CARE_PROVIDER_SITE_OTHER): Payer: Medicare Other

## 2020-09-20 VITALS — BP 152/77 | HR 66 | Ht 70.0 in | Wt 226.6 lb

## 2020-09-20 DIAGNOSIS — I2510 Atherosclerotic heart disease of native coronary artery without angina pectoris: Secondary | ICD-10-CM

## 2020-09-20 DIAGNOSIS — I214 Non-ST elevation (NSTEMI) myocardial infarction: Secondary | ICD-10-CM | POA: Diagnosis not present

## 2020-09-20 DIAGNOSIS — Z9861 Coronary angioplasty status: Secondary | ICD-10-CM | POA: Diagnosis not present

## 2020-09-20 DIAGNOSIS — E785 Hyperlipidemia, unspecified: Secondary | ICD-10-CM

## 2020-09-20 DIAGNOSIS — M48062 Spinal stenosis, lumbar region with neurogenic claudication: Secondary | ICD-10-CM

## 2020-09-20 DIAGNOSIS — Z9889 Other specified postprocedural states: Secondary | ICD-10-CM

## 2020-09-20 DIAGNOSIS — I10 Essential (primary) hypertension: Secondary | ICD-10-CM

## 2020-09-20 DIAGNOSIS — I498 Other specified cardiac arrhythmias: Secondary | ICD-10-CM

## 2020-09-20 DIAGNOSIS — R42 Dizziness and giddiness: Secondary | ICD-10-CM

## 2020-09-20 DIAGNOSIS — M79604 Pain in right leg: Secondary | ICD-10-CM

## 2020-09-20 DIAGNOSIS — M79605 Pain in left leg: Secondary | ICD-10-CM

## 2020-09-20 NOTE — Progress Notes (Signed)
Primary Care Provider: Einar Pheasant, DO Cardiologist: Glenetta Hew, MD Electrophysiologist: None  Clinic Note: Chief Complaint  Patient presents with   Leg Pain    Twitching leg pain with cramping usually occurring at night   Coronary Artery Disease   HPI:    Noah Scott is a 85 y.o. male with a PMH notable for CAD having PCI (non-STEMI) along with HTN and HLD below who presents today for 30-month follow-up.   NSTEMI 11/21/2017 (He noted that his "ANGINA" equivalent is "severe heartburn radiating to his throat") --> Cath-PCI m-dRCA (DES x 1) & Ost-Prox RCA (2 overlapping DES) -- Difficult, complicated PCI of 3 separate lesions in the RCA requiring use of extra-support buddy wire.; Mod-Severe ISR in OM1 (stent from 2001 - med management). Noah Scott was last seen on 07/21/2020-this is a follow-up evaluation after PCP had ordered an echocardiogram and chest x-ray. Able to go up least 3-4 flights of steps without any chest pain or dyspnea. No heart failure symptoms. Edema well controlled. Mostly limited by back pain and balance issues. Trying to do leg exercises and walking is much as possible.  Recent Hospitalizations:   N/a  Reviewed  CV studies:    The following studies were reviewed today: (if available, images/films reviewed: From Epic Chart or Care Everywhere)  No new studies   Interval History:   Noah Scott returns here today for urgent evaluation of a strenuous TAVR sensation that he gets in his legs. He says that when he lays down she'll start feeling a cramping sensation in his legs and he just gets cold and painful. He then will try to stretch out by leaning back and arching his back and he'll feel almost fluttering palpitation twitching sensation with intense pain down both legs. The twitching goes away and over time the legs gradually improved. He says it feels as though someone is closing on a valve and then it spiders back to life. He does still have a little bit  of leg pain with walking, and previously had had his legs evaluated by his PCP. He still try to do leg exercises and stretching and it does help some, but he still has leg cramping usually at night but sometimes walking.  Cardiac symptoms are stable. Once he is walking, he is able to do flights of steps and relatively decent walking without any chest discomfort or dyspnea. No heart failure symptoms of PND, orthopnea or edema. Again, he didn't notice any difference between with or without a statin.  Remains limited by back pain.  CV Review of Symptoms (Summary): no chest pain or dyspnea on exertion positive for - palpitations and Well-controlled edema; exertional dyspnea more because of back pain. -> Leg cramping and pain noted above negative for - irregular heartbeat, orthopnea, palpitations, paroxysmal nocturnal dyspnea, rapid heart rate, shortness of breath or Syncope/near syncope or TIA/amaurosis fugax, claudication  The patient Does Not have symptoms concerning for COVID-19 infection (fever, chills, cough, or new shortness of breath).   REVIEWED OF SYSTEMS   Review of Systems  Constitutional: Positive for weight loss (wgt going up & down). Negative for malaise/fatigue.  HENT: Negative for congestion and nosebleeds.   Respiratory: Negative for shortness of breath.        No further cough  Gastrointestinal: Negative for abdominal pain, blood in stool and melena.  Genitourinary: Negative for hematuria.  Musculoskeletal: Positive for back pain (spinal stenosis ), joint pain and myalgias (Not only now having the tingling incision is  less, he has the significant pain in his legs worse at night. Describes a cramping but that tightness and fluttering sensation).  Neurological: Positive for tingling (occasional radicular pain from spinal stenosis;) and weakness (every now & then, legs give out on him). Negative for dizziness (poor balance).   I have reviewed and (if needed) personally updated the  patient's problem list, medications, allergies, past medical and surgical history, social and family history.   PAST MEDICAL HISTORY   Past Medical History:  Diagnosis Date   CAD S/P percutaneous coronary angioplasty 2001   a) 2001 ([place]): PCI OM1 - now with ~65% ISR.;; b) NSTEMI 11/2017 - ost-prox RCA 75-65% (Overlapping ONYX DES 3.0 x 26 & 3.0 x 8 --> 3.5-3.3 mm). m-dRCA 95% (ONYX DES 2.75 x 18 mm -> 3.0 mm). Ost OM1 65% ISR (med Rx). dRCA-OstRDA (50%-25%)   Essential (primary) hypertension 11/21/2017   Hyperlipidemia 11/21/2017   Neurogenic claudication due to lumbar spinal stenosis 02/06/2018   NSTEMI (non-ST elevated myocardial infarction) ([place]) 11/21/2017   Multiple RCA lesions - 2 Overlapping DES Ost-Prox RCA & 1 in mid-distal RCA    PAST SURGICAL HISTORY   Past Surgical History:  Procedure Laterality Date   CORONARY STENT INTERVENTION N/A 11/25/2017   Procedure: CORONARY STENT INTERVENTION;  Surgeon: Leonie Man, MD;  Location: [facility];  Service: Cardiovascular:   m-dRCA 95%  (DES PCI - 0%.  Resolute ONYX DES 2.75 x 18 mm - ~3.0 mm). Ost-proxRCA 75-65% (DES PCI-0%. 2 overlapping Resolute ONYX DES 3.0 x 26 & 3.0 x 8 mm --> 3.5-3.3 mm)   CORONARY STENT INTERVENTION  2001   ([place]): PCI OM1   LEA DOPPLERS  12/12/2017   Normal bilateral ABIs and TBI's.   LEFT HEART CATH AND CORONARY ANGIOGRAPHY N/A 11/25/2017   Procedure: LEFT HEART CATH AND CORONARY ANGIOGRAPHY;  Surgeon: Leonie Man, MD;  Location: [facility];  Service: Cardiovascular:  Ost-proxRCA 75&65%(DES PCI), m-dRCA 95% (DES PCI), dRCA 50%-25% ost rPDA. ostOM1 65% ISR (from 2001).    LUMBAR LAMINECTOMY/DECOMPRESSION MICRODISCECTOMY Bilateral 06/13/2018   Procedure: Laminectomy and Foraminotomy - L1-L2 - L2-L3 - L3-L4 - bilateral;  Surgeon: Eustace Moore, MD;  Location: [facility];  Service: Neurosurgery;  Laterality: Bilateral;   TRANSTHORACIC ECHOCARDIOGRAM  11/23/2017   Normal LV  size and function.  EF 60 to 65%.  No RWMA.  GR 1 DD.  Mild RV dilation.   TRANSTHORACIC ECHOCARDIOGRAM  06/02/2020   [facility]): Normal LV size and function.  EF 60 and 65%.  Normal RV size and function.  Aortic sclerosis with no stenosis.  No AI.  Mildly dilated ascending aorta.  CVP estimated 5 mm.   Cardiac cath images from March 2019  Intervention     There is no immunization history on file for this patient.  Moderna - Vaccine x 2 - waiting on [place].  MEDICATIONS/ALLERGIES   Current Meds  Medication Sig   Aromatic Inhalants (VICKS VAPOR INHALER IN) Inhale 1 Inhaler into the lungs as needed (stuffy nose).   cyanocobalamin (,VITAMIN B-12,) 1000 MCG/ML injection Inject 1,000 mcg into the muscle every 30 (thirty) days.   doxazosin (CARDURA) 8 MG tablet Take 8 mg by mouth daily.   ezetimibe (ZETIA) 10 MG tablet TAKE 1 TABLET(10 MG) BY MOUTH DAILY   furosemide (LASIX) 20 MG tablet TAKE 1 TABLET(20 MG) BY MOUTH DAILY   losartan (COZAAR) 50 MG tablet Take 1 tablet (50 mg total) by mouth daily.  metoprolol tartrate (LOPRESSOR) 25 MG tablet Take 0.5 tablets (12.5 mg total) by mouth 2 (two) times daily.   nitroGLYCERIN (NITROSTAT) 0.4 MG SL tablet Place 1 tablet (0.4 mg total) under the tongue every 5 (five) minutes as needed for chest pain.   Potassium Chloride ER 20 MEQ TBCR TK 1/2 T PO QD WF   potassium chloride SA (K-DUR,KLOR-CON) 20 MEQ tablet Take 1 tablet (20 mEq total) by mouth daily.   prasugrel (EFFIENT) 10 MG TABS tablet Take 1 tablet (10 mg total) by mouth daily.   rosuvastatin (CRESTOR) 20 MG tablet Take 1 tablet (20 mg total) by mouth daily.    Allergies  Allergen Reactions   Plavix [Clopidogrel Bisulfate] Swelling   Brilinta [Ticagrelor] Other (See Comments)     Developed a cough   Enalapril     SOCIAL HISTORY/FAMILY HISTORY   Reviewed in Epic:  Pertinent findings: changed PCP   OBJCTIVE -PE, EKG, labs   Wt Readings from Last 3 Encounters:   09/20/20 226 lb 9.6 oz (102.8 kg)  07/21/20 218 lb 12.8 oz (99.2 kg)  01/18/20 223 lb (101.2 kg)    Physical Exam: BP (!) 152/77    Pulse 66    Ht 5\' 10"  (1.778 m)    Wt 226 lb 9.6 oz (102.8 kg)    SpO2 98%    BMI 32.51 kg/m  Physical Exam Constitutional:      General: He is not in acute distress.    Appearance: Normal appearance. He is obese. He is not ill-appearing or toxic-appearing.     Comments: Well-groomed.  Well-nourished  HENT:     Head: Normocephalic and atraumatic.  Neck:     Vascular: No carotid bruit.  Cardiovascular:     Rate and Rhythm: Normal rate and regular rhythm.  No extrasystoles are present.    Chest Wall: PMI is not displaced.     Pulses: Normal pulses and intact distal pulses.     Heart sounds: Normal heart sounds. No murmur heard. No friction rub. No gallop.   Pulmonary:     Effort: Pulmonary effort is normal. No respiratory distress.     Breath sounds: Normal breath sounds. No rales.     Comments: No dullness to percussion. Chest:     Chest wall: No tenderness.  Musculoskeletal:        General: Swelling (Minimal/trivial edema bilaterally.) present. Normal range of motion.     Cervical back: Normal range of motion and neck supple.     Comments: Walks with a somewhat stiff rigid gait, wide footed with poor balance  Skin:    General: Skin is warm and dry.  Neurological:     General: No focal deficit present.     Mental Status: He is alert and oriented to person, place, and time. Mental status is at baseline.  Psychiatric:        Mood and Affect: Mood normal.        Behavior: Behavior normal.        Thought Content: Thought content normal.        Judgment: Judgment normal.     Adult ECG Report NSR, rate 66 bpm. Normal EKG.  Recent Labs:   Reviewed (this indicates that he was off of his statin for a few months. Lab Results  Component Value Date   CHOL 156 07/12/2020   HDL 56 07/12/2020   LDLCALC 87 07/12/2020   TRIG 68 07/12/2020   CHOLHDL  2.8 07/12/2020   Lab Results  Component Value Date   CREATININE 0.92 06/02/2019   BUN 14 06/02/2019   NA 140 06/02/2019   K 4.2 06/02/2019   CL 103 06/02/2019   CO2 24 06/02/2019   No results found for: TSH  ASSESSMENT/PLAN    Problem List Items Addressed This Visit    Hyperlipidemia with target LDL less than 70 - Primary (Chronic)    Now he is back on statin. He should be due for having labs checked soon. Will order.      Relevant Orders   EKG 12-Lead (Completed)   NSTEMI (non-ST elevated myocardial infarction) (HCC) (Chronic)    Almost 3 years out from his MI. No recurrent anginal symptoms. No CHF symptoms.      Essential (primary) hypertension (Chronic)    Blood pressure is high today. He is very stressed out often uncomfortable. I think we'll hold off on adjusting medicines until seen in follow-up. For now continue current dose of losartan and metoprolol.      Relevant Orders   EKG 12-Lead (Completed)   CAD S/P percutaneous coronary angioplasty (Chronic)    Doing well. No anginal symptoms. 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 No bleeding issues on Effient. Has had allergic reactions to both Plavix and intolerance of Brilinta. Therefore remains on maintenance dose of Effient.   Plan:  continue current dose of Lopressor and losartan along with rosuvastatin and Zetia.  Continue Effient monotherapy-okay to hold 7-9 days days preop for surgery or procedures. (9 days for neurologic procedures)        Relevant Orders   EKG 12-Lead (Completed)   Neurogenic claudication due to lumbar spinal stenosis   Relevant Orders   EKG 12-Lead (Completed)   S/P lumbar laminectomy   Relevant Orders   EKG 12-Lead (Completed)   Leg pain, bilateral    I think the leg pain he is describing is neurologic in nature. It was not long ago that his PCP ordered lower extreme arterial Dopplers that did not show any significant disease.  We can recheck ABIs/LEA  Dopplers to exclude arterial disease, I think this is neurogenic claudication type symptoms.  Suspect that he probably benefit from being on gabapentin, or other neurologic agent.      Relevant Orders   EKG 12-Lead (Completed)   Fluttering heart    I cannot tell if the fluttering sensation he is describing his heart fluttering or a general nerve twitch type fluttering.  Plan: We'll check short term event monitor      Relevant Orders   EKG 12-Lead (Completed)   LONG TERM MONITOR (3-14 DAYS)   Episodic lightheadedness    Can tell that the lightheadedness that he is having at night is potentially related to arrhythmia. Plan: Zio patch monitor.      Relevant Orders   EKG 12-Lead (Completed)   LONG TERM MONITOR (3-14 DAYS)     COVID-19 Education: The signs and symptoms of COVID-19 were discussed with the patient and how to seek care for testing (follow up with PCP or arrange E-visit).   The importance of social distancing and COVID-19 vaccination was discussed today. The patient is practicing social distancing & Masking.   I spent a total of 24 minutes with the patient spent in direct patient consultation.   Additional time spent with chart review  / charting (studies, outside notes, etc): 10 Total Time: 34 min  Current medicines are reviewed at length with the patient today.  (+/- concerns) n/a  This visit occurred  during the SARS-CoV-2 public health emergency.  Safety protocols were in place, including screening questions prior to the visit, additional usage of staff PPE, and extensive cleaning of exam room while observing appropriate contact time as indicated for disinfecting solutions.  Notice: This dictation was prepared with Dragon dictation along with smaller phrase technology. Any transcriptional errors that result from this process are unintentional and may not be corrected upon review.  Patient Instructions / Medication Changes & Studies & Tests Ordered   Patient  Instructions  Medication Instructions:  Your physician recommends that you continue on your current medications as directed. Please refer to the Current Medication list given to you today.  *If you need a refill on your cardiac medications before your next appointment, please call your pharmacy*   Lab Work: FASTING lab work in February 2022 - hepatic function panel, lipid panel  If you have labs (blood work) drawn today and your tests are completely normal, you will receive your results only by:  MyChart Message (if you have MyChart) OR  A paper copy in the mail If you have any lab test that is abnormal or we need to change your treatment, we will call you to review the results.   Testing/Procedures: Your physician has requested that you have a lower extremity arterial duplex. This test is an ultrasound of the arteries in the legs. It looks at arterial blood flow in the legs. Allow one hour for Lower Arterial scans. There are no restrictions or special instructions  ZIO XT- Long Term Monitor Instructions   Your physician has requested you wear your ZIO patch monitor 3 days.   Follow-Up: At [facility], you and your health needs are our priority.  As part of our continuing mission to provide you with exceptional heart care, we have created designated Provider Care Teams.  These Care Teams include your primary Cardiologist (physician) and Advanced Practice Providers (APPs -  Physician Assistants and Nurse Practitioners) who all work together to provide you with the care you need, when you need it.  We recommend signing up for the patient portal called "MyChart".  Sign up information is provided on this After Visit Summary.  MyChart is used to connect with patients for Virtual Visits (Telemedicine).  Patients are able to view lab/test results, encounter notes, upcoming appointments, etc.  Non-urgent messages can be sent to your provider as well.   To learn more about what you can do with  MyChart, go to [email or web address].    Your next appointment:   Jan 16, 2021 with Dr. Herbie Baltimore   Studies Ordered:   Orders Placed This Encounter  Procedures   LONG TERM MONITOR (3-14 DAYS)   EKG 12-Lead     Bryan Lemma, M.D., M.S. Interventional Cardiologist   Pager # [phone] Phone # [phone] [address] [place], [place] [address]   Thank you for choosing Heartcare at [facility]!!

## 2020-09-20 NOTE — Telephone Encounter (Signed)
Noah Scott is calling requesting an appointment in regards to having problems with his leg circulation. He did not go into any further detail because he stated he'd rather only have to explain it once. Please advise.

## 2020-09-20 NOTE — Telephone Encounter (Signed)
Spoke with patient about circulation issue. Patient reports at night when he goes to sleep he is awakened by his legs being cold and asleep. He stretches twice and then he feels his heart begin to increase, flutter and pump quickly like it's trying to pump blood to his legs. Once his legs are warm his heart rate decreases. He reports this has been happening every 2 hours at night. He would like to see Dr. Herbie Baltimore. Appointment made for today at 1:40pm.

## 2020-09-20 NOTE — Patient Instructions (Signed)
Medication Instructions:  Your physician recommends that you continue on your current medications as directed. Please refer to the Current Medication list given to you today.  *If you need a refill on your cardiac medications before your next appointment, please call your pharmacy*   Lab Work: FASTING lab work in February 2022 - hepatic function panel, lipid panel  If you have labs (blood work) drawn today and your tests are completely normal, you will receive your results only by: Marland Kitchen MyChart Message (if you have MyChart) OR . A paper copy in the mail If you have any lab test that is abnormal or we need to change your treatment, we will call you to review the results.   Testing/Procedures: Your physician has requested that you have a lower extremity arterial duplex. This test is an ultrasound of the arteries in the legs. It looks at arterial blood flow in the legs. Allow one hour for Lower Arterial scans. There are no restrictions or special instructions  ZIO XT- Long Term Monitor Instructions   Your physician has requested you wear your ZIO patch monitor 3 days.   This is a single patch monitor.  [employer] supplies one patch monitor per enrollment.  Additional stickers are not available.   Please do not apply patch if you will be having a Nuclear Stress Test, Echocardiogram, Cardiac CT, MRI, or Chest Xray during the time frame you would be wearing the monitor. The patch cannot be worn during these tests.  You cannot remove and re-apply the ZIO XT patch monitor.   Your ZIO patch monitor will be sent USPS Priority mail from [facility] directly to your home address. The monitor may also be mailed to a PO BOX if home delivery is not available.   It may take 3-5 days to receive your monitor after you have been enrolled.   Once you have received you monitor, please review enclosed instructions.  Your monitor has already been registered assigning a specific monitor serial # to you.    Applying the monitor   Shave hair from upper left chest.   Hold abrader disc by orange tab.  Rub abrader in 40 strokes over left upper chest as indicated in your monitor instructions.   Clean area with 4 enclosed alcohol pads .  Use all pads to assure are is cleaned thoroughly.  Let dry.   Apply patch as indicated in monitor instructions.  Patch will be place under collarbone on left side of chest with arrow pointing upward.   Rub patch adhesive wings for 2 minutes.Remove white label marked "1".  Remove white label marked "2".  Rub patch adhesive wings for 2 additional minutes.   While looking in a mirror, press and release button in center of patch.  A small green light will flash 3-4 times .  This will be your only indicator the monitor has been turned on.     Do not shower for the first 24 hours.  You may shower after the first 24 hours.   Press button if you feel a symptom. You will hear a small click.  Record Date, Time and Symptom in the Patient Log Book.   When you are ready to remove patch, follow instructions on last 2 pages of Patient Log Book.  Stick patch monitor onto last page of Patient Log Book.   Place Patient Log Book in [place] box.  Use locking tab on box and tape box closed securely.  The [place] box has  prepaid postage on it.  Please place in mailbox as soon as possible.  Your physician should have your test results approximately 7 days after the monitor has been mailed back to [facility].   Call [facility] Customer Care at [phone] if you have questions regarding your ZIO XT patch monitor.  Call them immediately if you see an orange light blinking on your monitor.   If your monitor falls off in less than 4 days contact our Monitor department at [phone].  If your monitor becomes loose or falls off after 4 days call [employer] at [phone] for suggestions on securing your monitor.     Follow-Up: At [facility], you and your health needs  are our priority.  As part of our continuing mission to provide you with exceptional heart care, we have created designated Provider Care Teams.  These Care Teams include your primary Cardiologist (physician) and Advanced Practice Providers (APPs -  Physician Assistants and Nurse Practitioners) who all work together to provide you with the care you need, when you need it.  We recommend signing up for the patient portal called "MyChart".  Sign up information is provided on this After Visit Summary.  MyChart is used to connect with patients for Virtual Visits (Telemedicine).  Patients are able to view lab/test results, encounter notes, upcoming appointments, etc.  Non-urgent messages can be sent to your provider as well.   To learn more about what you can do with MyChart, go to [email or web address].    Your next appointment:   Jan 16, 2021 with Dr. Herbie Baltimore

## 2020-09-21 ENCOUNTER — Other Ambulatory Visit (HOSPITAL_COMMUNITY): Payer: Self-pay | Admitting: Cardiology

## 2020-09-21 DIAGNOSIS — I739 Peripheral vascular disease, unspecified: Secondary | ICD-10-CM

## 2020-09-22 ENCOUNTER — Ambulatory Visit (HOSPITAL_COMMUNITY)
Admission: RE | Admit: 2020-09-22 | Discharge: 2020-09-22 | Disposition: A | Discharge status: Home / Self Care | Payer: Medicare Other | Source: Ambulatory Visit | Attending: Cardiovascular Disease | Admitting: Cardiovascular Disease

## 2020-09-22 ENCOUNTER — Other Ambulatory Visit: Payer: Self-pay

## 2020-09-22 DIAGNOSIS — M79605 Pain in left leg: Secondary | ICD-10-CM | POA: Diagnosis present

## 2020-09-22 DIAGNOSIS — I739 Peripheral vascular disease, unspecified: Secondary | ICD-10-CM | POA: Diagnosis present

## 2020-09-22 DIAGNOSIS — M79604 Pain in right leg: Secondary | ICD-10-CM | POA: Insufficient documentation

## 2020-09-24 DIAGNOSIS — R42 Dizziness and giddiness: Secondary | ICD-10-CM

## 2020-09-24 DIAGNOSIS — I498 Other specified cardiac arrhythmias: Secondary | ICD-10-CM | POA: Diagnosis not present

## 2020-09-25 ENCOUNTER — Encounter: Payer: Self-pay | Admitting: Cardiology

## 2020-09-25 NOTE — Assessment & Plan Note (Signed)
Blood pressure is high today. He is very stressed out often uncomfortable. I think we'll hold off on adjusting medicines until seen in follow-up. For now continue current dose of losartan and metoprolol.

## 2020-09-25 NOTE — Assessment & Plan Note (Signed)
Almost 3 years out from his MI. No recurrent anginal symptoms. No CHF symptoms.

## 2020-09-25 NOTE — Assessment & Plan Note (Signed)
Now he is back on statin. He should be due for having labs checked soon. Will order.

## 2020-09-25 NOTE — Progress Notes (Incomplete)
Primary Care Provider: Einar Pheasant, DO Cardiologist: Glenetta Hew, MD Electrophysiologist: None  Clinic Note: Chief Complaint  Patient presents with  . Leg Pain    Twitching leg pain with cramping usually occurring at night  . Coronary Artery Disease   HPI:    Noah Scott is a 85 y.o. male with a PMH notable for CAD having PCI (non-STEMI) along with HTN and HLD below who presents today for 57-month follow-up.   NSTEMI 11/21/2017 (He noted that his "ANGINA" equivalent is "severe heartburn radiating to his throat") --> Cath-PCI m-dRCA (DES x 1) & Ost-Prox RCA (2 overlapping DES) -- Difficult, complicated PCI of 3 separate lesions in the RCA requiring use of extra-support buddy wire.; Mod-Severe ISR in OM1 (stent from 2001 - med management). Noah Scott was last seen on 07/21/2020-this is a follow-up evaluation after PCP had ordered an echocardiogram and chest x-ray. Able to go up least 3-4 flights of steps without any chest pain or dyspnea. No heart failure symptoms. Edema well controlled. Mostly limited by back pain and balance issues. Trying to do leg exercises and walking is much as possible.  Recent Hospitalizations:   N/a  Reviewed  CV studies:    The following studies were reviewed today: (if available, images/films reviewed: From Epic Chart or Care Everywhere) . No new studies   Interval History:   Noah Scott returns here today for urgent evaluation of a strenuous TAVR sensation that he gets in his legs. He says that when he lays down she'll start feeling a cramping sensation in his legs and he just gets cold and painful. He then will try to stretch out by leaning back and arching his back and he'll feel almost fluttering palpitation twitching sensation with intense pain down both legs. The twitching goes away and over time the legs gradually improved. He says it feels as though someone is closing on a valve and then it spiders back to life. He does still have a little bit  of leg pain with walking, and previously had had his legs evaluated by his PCP. He still try to do leg exercises and stretching and it does help some, but he still has leg cramping usually at night but sometimes walking.  Cardiac symptoms are stable. Once he is walking, he is able to do flights of steps and relatively decent walking without any chest discomfort or dyspnea. No heart failure symptoms of PND, orthopnea or edema. Again, he didn't notice any difference between with or without a statin.  Remains limited by back pain.  CV Review of Symptoms (Summary): no chest pain or dyspnea on exertion positive for - palpitations and Well-controlled edema; exertional dyspnea more because of back pain. -> Leg cramping and pain noted above negative for - irregular heartbeat, orthopnea, palpitations, paroxysmal nocturnal dyspnea, rapid heart rate, shortness of breath or Syncope/near syncope or TIA/amaurosis fugax, claudication  The patient Does Not have symptoms concerning for COVID-19 infection (fever, chills, cough, or new shortness of breath).   REVIEWED OF SYSTEMS   Review of Systems  Constitutional: Positive for weight loss (wgt going up & down). Negative for malaise/fatigue.  HENT: Negative for congestion and nosebleeds.   Respiratory: Negative for shortness of breath.        No further cough  Gastrointestinal: Negative for abdominal pain, blood in stool and melena.  Genitourinary: Negative for hematuria.  Musculoskeletal: Positive for back pain (spinal stenosis ), joint pain and myalgias (Not only now having the tingling incision is  less, he has the significant pain in his legs worse at night. Describes a cramping but that tightness and fluttering sensation).  Neurological: Positive for tingling (occasional radicular pain from spinal stenosis;) and weakness (every now & then, legs give out on him). Negative for dizziness (poor balance).   I have reviewed and (if needed) personally updated the  patient's problem list, medications, allergies, past medical and surgical history, social and family history.   PAST MEDICAL HISTORY   Past Medical History:  Diagnosis Date  . CAD S/P percutaneous coronary angioplasty 2001   a) 2001 ([place]): PCI OM1 - now with ~65% ISR.;; b) NSTEMI 11/2017 - ost-prox RCA 75-65% (Overlapping ONYX DES 3.0 x 26 & 3.0 x 8 --> 3.5-3.3 mm). m-dRCA 95% (ONYX DES 2.75 x 18 mm -> 3.0 mm). Ost OM1 65% ISR (med Rx). dRCA-OstRDA (50%-25%)  . Essential (primary) hypertension 11/21/2017  . Hyperlipidemia 11/21/2017  . Neurogenic claudication due to lumbar spinal stenosis 02/06/2018  . NSTEMI (non-ST elevated myocardial infarction) ([place]) 11/21/2017   Multiple RCA lesions - 2 Overlapping DES Ost-Prox RCA & 1 in mid-distal RCA    PAST SURGICAL HISTORY   Past Surgical History:  Procedure Laterality Date  . CORONARY STENT INTERVENTION N/A 11/25/2017   Procedure: CORONARY STENT INTERVENTION;  Surgeon: Leonie Man, MD;  Location: [facility];  Service: Cardiovascular:   m-dRCA 95%  (DES PCI - 0%.  Resolute ONYX DES 2.75 x 18 mm - ~3.0 mm). Ost-proxRCA 75-65% (DES PCI-0%. 2 overlapping Resolute ONYX DES 3.0 x 26 & 3.0 x 8 mm --> 3.5-3.3 mm)  . CORONARY STENT INTERVENTION  2001   ([place]): PCI OM1  . LEA DOPPLERS  12/12/2017   Normal bilateral ABIs and TBI's.  Marland Kitchen LEFT HEART CATH AND CORONARY ANGIOGRAPHY N/A 11/25/2017   Procedure: LEFT HEART CATH AND CORONARY ANGIOGRAPHY;  Surgeon: Leonie Man, MD;  Location: [facility];  Service: Cardiovascular:  Ost-proxRCA 75&65%(DES PCI), m-dRCA 95% (DES PCI), dRCA 50%-25% ost rPDA. ostOM1 65% ISR (from 2001).   . LUMBAR LAMINECTOMY/DECOMPRESSION MICRODISCECTOMY Bilateral 06/13/2018   Procedure: Laminectomy and Foraminotomy - L1-L2 - L2-L3 - L3-L4 - bilateral;  Surgeon: Eustace Moore, MD;  Location: [facility];  Service: Neurosurgery;  Laterality: Bilateral;  . TRANSTHORACIC ECHOCARDIOGRAM  11/23/2017   Normal LV  size and function.  EF 60 to 65%.  No RWMA.  GR 1 DD.  Mild RV dilation.  . TRANSTHORACIC ECHOCARDIOGRAM  06/02/2020   [facility]): Normal LV size and function.  EF 60 and 65%.  Normal RV size and function.  Aortic sclerosis with no stenosis.  No AI.  Mildly dilated ascending aorta.  CVP estimated 5 mm.   Cardiac cath images from March 2019  Intervention     There is no immunization history on file for this patient.  Moderna - Vaccine x 2 - waiting on [place].  MEDICATIONS/ALLERGIES   Current Meds  Medication Sig  . Aromatic Inhalants (VICKS VAPOR INHALER IN) Inhale 1 Inhaler into the lungs as needed (stuffy nose).  . cyanocobalamin (,VITAMIN B-12,) 1000 MCG/ML injection Inject 1,000 mcg into the muscle every 30 (thirty) days.  Marland Kitchen doxazosin (CARDURA) 8 MG tablet Take 8 mg by mouth daily.  Marland Kitchen ezetimibe (ZETIA) 10 MG tablet TAKE 1 TABLET(10 MG) BY MOUTH DAILY  . furosemide (LASIX) 20 MG tablet TAKE 1 TABLET(20 MG) BY MOUTH DAILY  . losartan (COZAAR) 50 MG tablet Take 1 tablet (50 mg total) by mouth daily.  Marland Kitchen  metoprolol tartrate (LOPRESSOR) 25 MG tablet Take 0.5 tablets (12.5 mg total) by mouth 2 (two) times daily.  . nitroGLYCERIN (NITROSTAT) 0.4 MG SL tablet Place 1 tablet (0.4 mg total) under the tongue every 5 (five) minutes as needed for chest pain.  Marland Kitchen Potassium Chloride ER 20 MEQ TBCR TK 1/2 T PO QD WF  . potassium chloride SA (K-DUR,KLOR-CON) 20 MEQ tablet Take 1 tablet (20 mEq total) by mouth daily.  . prasugrel (EFFIENT) 10 MG TABS tablet Take 1 tablet (10 mg total) by mouth daily.  . rosuvastatin (CRESTOR) 20 MG tablet Take 1 tablet (20 mg total) by mouth daily.    Allergies  Allergen Reactions  . Plavix [Clopidogrel Bisulfate] Swelling  . Brilinta [Ticagrelor] Other (See Comments)     Developed a cough  . Enalapril     SOCIAL HISTORY/FAMILY HISTORY   Reviewed in Epic:  Pertinent findings: changed PCP   OBJCTIVE -PE, EKG, labs   Wt Readings from Last 3 Encounters:   09/20/20 226 lb 9.6 oz (102.8 kg)  07/21/20 218 lb 12.8 oz (99.2 kg)  01/18/20 223 lb (101.2 kg)    Physical Exam: BP (!) 152/77   Pulse 66   Ht 5\' 10"  (1.778 m)   Wt 226 lb 9.6 oz (102.8 kg)   SpO2 98%   BMI 32.51 kg/m  Physical Exam Constitutional:      General: He is not in acute distress.    Appearance: Normal appearance. He is obese. He is not ill-appearing or toxic-appearing.     Comments: Well-groomed.  Well-nourished  HENT:     Head: Normocephalic and atraumatic.  Neck:     Vascular: No carotid bruit.  Cardiovascular:     Rate and Rhythm: Normal rate and regular rhythm.  No extrasystoles are present.    Chest Wall: PMI is not displaced.     Pulses: Normal pulses and intact distal pulses.     Heart sounds: Normal heart sounds. No murmur heard. No friction rub. No gallop.   Pulmonary:     Effort: Pulmonary effort is normal. No respiratory distress.     Breath sounds: Normal breath sounds. No rales.     Comments: No dullness to percussion. Chest:     Chest wall: No tenderness.  Musculoskeletal:        General: Swelling (Minimal/trivial edema bilaterally.) present. Normal range of motion.     Cervical back: Normal range of motion and neck supple.     Comments: Walks with a somewhat stiff rigid gait, wide footed with poor balance  Skin:    General: Skin is warm and dry.  Neurological:     General: No focal deficit present.     Mental Status: He is alert and oriented to person, place, and time. Mental status is at baseline.  Psychiatric:        Mood and Affect: Mood normal.        Behavior: Behavior normal.        Thought Content: Thought content normal.        Judgment: Judgment normal.     Adult ECG Report NSR, rate 66 bpm. Normal EKG.  Recent Labs:   Reviewed (this indicates that he was off of his statin for a few months. Lab Results  Component Value Date   CHOL 156 07/12/2020   HDL 56 07/12/2020   LDLCALC 87 07/12/2020   TRIG 68 07/12/2020   CHOLHDL  2.8 07/12/2020   Lab Results  Component Value Date  CREATININE 0.92 06/02/2019   BUN 14 06/02/2019   NA 140 06/02/2019   K 4.2 06/02/2019   CL 103 06/02/2019   CO2 24 06/02/2019   No results found for: TSH  ASSESSMENT/PLAN    Problem List Items Addressed This Visit    Hyperlipidemia with target LDL less than 70 - Primary (Chronic)   Relevant Orders   EKG 12-Lead (Completed)   Essential (primary) hypertension (Chronic)   Relevant Orders   EKG 12-Lead (Completed)   CAD S/P percutaneous coronary angioplasty (Chronic)   Relevant Orders   EKG 12-Lead (Completed)   Neurogenic claudication due to lumbar spinal stenosis   Relevant Orders   EKG 12-Lead (Completed)   S/P lumbar laminectomy   Relevant Orders   EKG 12-Lead (Completed)   Leg pain, bilateral   Relevant Orders   EKG 12-Lead (Completed)   Fluttering heart   Relevant Orders   EKG 12-Lead (Completed)   LONG TERM MONITOR (3-14 DAYS)   Episodic lightheadedness   Relevant Orders   EKG 12-Lead (Completed)   LONG TERM MONITOR (3-14 DAYS)     COVID-19 Education: The signs and symptoms of COVID-19 were discussed with the patient and how to seek care for testing (follow up with PCP or arrange E-visit).   The importance of social distancing and COVID-19 vaccination was discussed today. The patient is practicing social distancing & Masking.   I spent a total of 24 minutes with the patient spent in direct patient consultation.   Additional time spent with chart review  / charting (studies, outside notes, etc): 10 Total Time: 34 min  Current medicines are reviewed at length with the patient today.  (+/- concerns) n/a  This visit occurred during the SARS-CoV-2 public health emergency.  Safety protocols were in place, including screening questions prior to the visit, additional usage of staff PPE, and extensive cleaning of exam room while observing appropriate contact time as indicated for disinfecting solutions.  Notice:  This dictation was prepared with Dragon dictation along with smaller phrase technology. Any transcriptional errors that result from this process are unintentional and may not be corrected upon review.  Patient Instructions / Medication Changes & Studies & Tests Ordered   Patient Instructions  Medication Instructions:  Your physician recommends that you continue on your current medications as directed. Please refer to the Current Medication list given to you today.  *If you need a refill on your cardiac medications before your next appointment, please call your pharmacy*   Lab Work: FASTING lab work in February 2022 - hepatic function panel, lipid panel  If you have labs (blood work) drawn today and your tests are completely normal, you will receive your results only by: Marland Kitchen MyChart Message (if you have MyChart) OR . A paper copy in the mail If you have any lab test that is abnormal or we need to change your treatment, we will call you to review the results.   Testing/Procedures: Your physician has requested that you have a lower extremity arterial duplex. This test is an ultrasound of the arteries in the legs. It looks at arterial blood flow in the legs. Allow one hour for Lower Arterial scans. There are no restrictions or special instructions  ZIO XT- Long Term Monitor Instructions   Your physician has requested you wear your ZIO patch monitor 3 days.   This is a single patch monitor.  [employer] supplies one patch monitor per enrollment.  Additional stickers are not available.   Please do not  apply patch if you will be having a Nuclear Stress Test, Echocardiogram, Cardiac CT, MRI, or Chest Xray during the time frame you would be wearing the monitor. The patch cannot be worn during these tests.  You cannot remove and re-apply the ZIO XT patch monitor.   Your ZIO patch monitor will be sent USPS Priority mail from [facility] directly to your home address. The monitor may also be  mailed to a PO BOX if home delivery is not available.   It may take 3-5 days to receive your monitor after you have been enrolled.   Once you have received you monitor, please review enclosed instructions.  Your monitor has already been registered assigning a specific monitor serial # to you.   Applying the monitor   Shave hair from upper left chest.   Hold abrader disc by orange tab.  Rub abrader in 40 strokes over left upper chest as indicated in your monitor instructions.   Clean area with 4 enclosed alcohol pads .  Use all pads to assure are is cleaned thoroughly.  Let dry.   Apply patch as indicated in monitor instructions.  Patch will be place under collarbone on left side of chest with arrow pointing upward.   Rub patch adhesive wings for 2 minutes.Remove white label marked "1".  Remove white label marked "2".  Rub patch adhesive wings for 2 additional minutes.   While looking in a mirror, press and release button in center of patch.  A small green light will flash 3-4 times .  This will be your only indicator the monitor has been turned on.     Do not shower for the first 24 hours.  You may shower after the first 24 hours.   Press button if you feel a symptom. You will hear a small click.  Record Date, Time and Symptom in the Patient Log Book.   When you are ready to remove patch, follow instructions on last 2 pages of Patient Log Book.  Stick patch monitor onto last page of Patient Log Book.   Place Patient Log Book in [place] box.  Use locking tab on box and tape box closed securely.  The [employer] has [employer] on it.  Please place in mailbox as soon as possible.  Your physician should have your test results approximately 7 days after the monitor has been mailed back to [facility].   Call [place] at [phone] if you have questions regarding your ZIO XT patch monitor.  Call them immediately if you see an orange light blinking on your  monitor.   If your monitor falls off in less than 4 days contact our Monitor department at [phone].  If your monitor becomes loose or falls off after 4 days call [employer] at [phone] for suggestions on securing your monitor.     Follow-Up: At [facility], you and your health needs are our priority.  As part of our continuing mission to provide you with exceptional heart care, we have created designated Provider Care Teams.  These Care Teams include your primary Cardiologist (physician) and Advanced Practice Providers (APPs -  Physician Assistants and Nurse Practitioners) who all work together to provide you with the care you need, when you need it.  We recommend signing up for the patient portal called "MyChart".  Sign up information is provided on this After Visit Summary.  MyChart is used to connect with patients for Virtual Visits (Telemedicine).  Patients are  able to view lab/test results, encounter notes, upcoming appointments, etc.  Non-urgent messages can be sent to your provider as well.   To learn more about what you can do with MyChart, go to [email or web address].    Your next appointment:   Jan 16, 2021 with Dr. Ellyn Hack   Studies Ordered:   Orders Placed This Encounter  Procedures  . LONG TERM MONITOR (3-14 DAYS)  . EKG 12-Lead     Glenetta Hew, M.D., M.S. Interventional Cardiologist   Pager # [phone] Phone # [phone] [address] [place], [place] [address]   Thank you for choosing [facility] at [facility]!!

## 2020-09-26 NOTE — Assessment & Plan Note (Signed)
I think the leg pain he is describing is neurologic in nature. It was not long ago that his PCP ordered lower extreme arterial Dopplers that did not show any significant disease.  We can recheck ABIs/LEA Dopplers to exclude arterial disease, I think this is neurogenic claudication type symptoms.  Suspect that he probably benefit from being on gabapentin, or other neurologic agent.

## 2020-09-26 NOTE — Assessment & Plan Note (Signed)
I cannot tell if the fluttering sensation he is describing his heart fluttering or a general nerve twitch type fluttering.  Plan: We'll check short term event monitor

## 2020-09-26 NOTE — Assessment & Plan Note (Signed)
Can tell that the lightheadedness that he is having at night is potentially related to arrhythmia. Plan: Zio patch monitor.

## 2020-09-26 NOTE — Assessment & Plan Note (Signed)
Doing well. No anginal symptoms. 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 No bleeding issues on Effient. Has had allergic reactions to both Plavix and intolerance of Brilinta. Therefore remains on maintenance dose of Effient.   Plan:  continue current dose of Lopressor and losartan along with rosuvastatin and Zetia.  Continue Effient monotherapy-okay to hold 7-9 days days preop for surgery or procedures. (9 days for neurologic procedures)

## 2020-10-19 LAB — LIPID PANEL
Chol/HDL Ratio: 2.9 ratio (ref 0.0–5.0)
Cholesterol, Total: 118 mg/dL (ref 100–199)
HDL: 41 mg/dL (ref >39)
LDL Chol Calc (NIH): 62 mg/dL (ref 0–99)
Triglycerides: 71 mg/dL (ref 0–149)
VLDL Cholesterol Cal: 15 mg/dL (ref 5–40)

## 2020-10-19 LAB — HEPATIC FUNCTION PANEL
ALT: 12 IU/L (ref 0–44)
AST: 19 IU/L (ref 0–40)
Albumin: 4 g/dL (ref 3.6–4.6)
Alkaline Phosphatase: 76 IU/L (ref 44–121)
Bilirubin Total: 0.5 mg/dL (ref 0.0–1.2)
Bilirubin, Direct: 0.18 mg/dL (ref 0.00–0.40)
Total Protein: 6.4 g/dL (ref 6.0–8.5)

## 2020-11-15 DIAGNOSIS — I872 Venous insufficiency (chronic) (peripheral): Secondary | ICD-10-CM

## 2020-11-15 HISTORY — DX: Venous insufficiency (chronic) (peripheral): I87.2

## 2020-12-08 ENCOUNTER — Other Ambulatory Visit: Payer: Self-pay | Admitting: *Deleted

## 2020-12-08 DIAGNOSIS — M79604 Pain in right leg: Secondary | ICD-10-CM

## 2020-12-08 DIAGNOSIS — M79605 Pain in left leg: Secondary | ICD-10-CM

## 2020-12-14 ENCOUNTER — Ambulatory Visit (HOSPITAL_COMMUNITY)
Admission: RE | Admit: 2020-12-14 | Discharge: 2020-12-14 | Disposition: A | Discharge status: Home / Self Care | Payer: Medicare Other | Source: Ambulatory Visit | Attending: Vascular Surgery | Admitting: Vascular Surgery

## 2020-12-14 ENCOUNTER — Other Ambulatory Visit: Payer: Self-pay

## 2020-12-14 ENCOUNTER — Ambulatory Visit (INDEPENDENT_AMBULATORY_CARE_PROVIDER_SITE_OTHER): Payer: Medicare Other | Admitting: Physician Assistant

## 2020-12-14 VITALS — BP 128/78 | HR 77 | Temp 98.6°F | Resp 20 | Ht 70.0 in | Wt 223.6 lb

## 2020-12-14 DIAGNOSIS — I2510 Atherosclerotic heart disease of native coronary artery without angina pectoris: Secondary | ICD-10-CM

## 2020-12-14 DIAGNOSIS — Z9861 Coronary angioplasty status: Secondary | ICD-10-CM

## 2020-12-14 DIAGNOSIS — I872 Venous insufficiency (chronic) (peripheral): Secondary | ICD-10-CM

## 2020-12-14 DIAGNOSIS — M79605 Pain in left leg: Secondary | ICD-10-CM | POA: Diagnosis present

## 2020-12-14 DIAGNOSIS — M79604 Pain in right leg: Secondary | ICD-10-CM

## 2020-12-14 NOTE — Progress Notes (Signed)
Requested by:  Einar Pheasant, DO [address] [place],  [place] [address]  Reason for consultation: BLE edema   History of Present Illness   Noah Scott is a 85 y.o. (1935-12-06) male who presents for evaluation of lower extremity edema per his referral sheet.  His primary complaint is of bilateral posterior knee pain.  This is worse with walking. Improves with immobility and Tramadol. He has knee hight compression stockings that he says are difficult to don and push the edema to his thighs.  He denies calf tightness with walking or rest pain. He is not diabetic. He is compliant with statin.  He also takes Zetia. He has a history of coronary artery stent placement and is maintained on Effient. He is a former smoker and quit in 1970.  Venous symptoms include: positive if (X) [  ] aching [  ] heavy [  ] tired  [  ] throbbing [x  ] burning  [  ] itching [ x ]swelling [  ] bleeding [  ] ulcer  Onset/duration:  2 years Occupation:  retired Aggravating factors: walking Alleviating factors: rest Compression:  yes Helps:  no Pain medications:  Tramadol Previous vein procedures:  no History of DVT:  no  Past Medical History:  Diagnosis Date  . CAD S/P percutaneous coronary angioplasty 2001   a) 2001 ([place]): PCI OM1 - now with ~65% ISR.;; b) NSTEMI 11/2017 - ost-prox RCA 75-65% (Overlapping ONYX DES 3.0 x 26 & 3.0 x 8 --> 3.5-3.3 mm). m-dRCA 95% (ONYX DES 2.75 x 18 mm -> 3.0 mm). Ost OM1 65% ISR (med Rx). dRCA-OstRDA (50%-25%)  . Essential (primary) hypertension 11/21/2017  . Hyperlipidemia 11/21/2017  . Neurogenic claudication due to lumbar spinal stenosis 02/06/2018  . NSTEMI (non-ST elevated myocardial infarction) ([place]) 11/21/2017   Multiple RCA lesions - 2 Overlapping DES Ost-Prox RCA & 1 in mid-distal RCA    Past Surgical History:  Procedure Laterality Date  . CORONARY STENT INTERVENTION N/A 11/25/2017   Procedure: CORONARY STENT INTERVENTION;  Surgeon:  Leonie Man, MD;  Location: [facility];  Service: Cardiovascular:   m-dRCA 95%  (DES PCI - 0%.  Resolute ONYX DES 2.75 x 18 mm - ~3.0 mm). Ost-proxRCA 75-65% (DES PCI-0%. 2 overlapping Resolute ONYX DES 3.0 x 26 & 3.0 x 8 mm --> 3.5-3.3 mm)  . CORONARY STENT INTERVENTION  2001   ([place]): PCI OM1  . LEA DOPPLERS  12/12/2017   Normal bilateral ABIs and TBI's.  Marland Kitchen LEFT HEART CATH AND CORONARY ANGIOGRAPHY N/A 11/25/2017   Procedure: LEFT HEART CATH AND CORONARY ANGIOGRAPHY;  Surgeon: Leonie Man, MD;  Location: [facility];  Service: Cardiovascular:  Ost-proxRCA 75&65%(DES PCI), m-dRCA 95% (DES PCI), dRCA 50%-25% ost rPDA. ostOM1 65% ISR (from 2001).   . LUMBAR LAMINECTOMY/DECOMPRESSION MICRODISCECTOMY Bilateral 06/13/2018   Procedure: Laminectomy and Foraminotomy - L1-L2 - L2-L3 - L3-L4 - bilateral;  Surgeon: Eustace Moore, MD;  Location: [facility];  Service: Neurosurgery;  Laterality: Bilateral;  . TRANSTHORACIC ECHOCARDIOGRAM  11/23/2017   Normal LV size and function.  EF 60 to 65%.  No RWMA.  GR 1 DD.  Mild RV dilation.  . TRANSTHORACIC ECHOCARDIOGRAM  06/02/2020   [facility]): Normal LV size and function.  EF 60 and 65%.  Normal RV size and function.  Aortic sclerosis with no stenosis.  No AI.  Mildly dilated ascending aorta.  CVP estimated 5 mm.  Social History   Socioeconomic History  . Marital status: Widowed    Spouse name: Not on file  . Number of children: 1  . Years of education: 4  . Highest education level: Not on file  Occupational History  . Occupation: retired Geophysical data processor  Tobacco Use  . Smoking status: Former Smoker    Quit date: 03/02/1969    Years since quitting: 51.8  . Smokeless tobacco: Never Used  Vaping Use  . Vaping Use: Never used  Substance and Sexual Activity  . Alcohol use: Not Currently  . Drug use: No  . Sexual activity: Not on file  Other Topics Concern  . Not on file  Social History Narrative   Lives alone in an  apartment on the first floor.  Has one daughter.  Retired Geophysical data processor.  Education: some college.    Social Determinants of Health   Financial Resource Strain: Not on file  Food Insecurity: Not on file  Transportation Needs: Not on file  Physical Activity: Not on file  Stress: Not on file  Social Connections: Not on file  Intimate Partner Violence: Not on file    Family History  Problem Relation Age of Onset  . Heart disease Father        Pt does not know details  . Stroke Neg Hx   . Diabetes Neg Hx     Current Outpatient Medications  Medication Sig Dispense Refill  . Aromatic Inhalants (VICKS VAPOR INHALER IN) Inhale 1 Inhaler into the lungs as needed (stuffy nose).    . cyanocobalamin (,VITAMIN B-12,) 1000 MCG/ML injection Inject 1,000 mcg into the muscle every 30 (thirty) days.    Marland Kitchen doxazosin (CARDURA) 8 MG tablet Take 8 mg by mouth daily.    Marland Kitchen ezetimibe (ZETIA) 10 MG tablet TAKE 1 TABLET(10 MG) BY MOUTH DAILY 30 tablet 11  . furosemide (LASIX) 20 MG tablet TAKE 1 TABLET(20 MG) BY MOUTH DAILY 30 tablet 11  . losartan (COZAAR) 50 MG tablet Take 1 tablet (50 mg total) by mouth daily. 30 tablet 11  . metoprolol tartrate (LOPRESSOR) 25 MG tablet Take 0.5 tablets (12.5 mg total) by mouth 2 (two) times daily. 30 tablet 3  . nitroGLYCERIN (NITROSTAT) 0.4 MG SL tablet Place 1 tablet (0.4 mg total) under the tongue every 5 (five) minutes as needed for chest pain. 25 tablet 3  . Potassium Chloride ER 20 MEQ TBCR TK 1/2 T PO QD WF    . potassium chloride SA (K-DUR,KLOR-CON) 20 MEQ tablet Take 1 tablet (20 mEq total) by mouth daily. 30 tablet 11  . prasugrel (EFFIENT) 10 MG TABS tablet Take 1 tablet (10 mg total) by mouth daily. 90 tablet 2  . rosuvastatin (CRESTOR) 20 MG tablet Take 1 tablet (20 mg total) by mouth daily. 90 tablet 3   No current facility-administered medications for this visit.    Allergies  Allergen Reactions  . Plavix [Clopidogrel Bisulfate] Swelling  .  Brilinta [Ticagrelor] Other (See Comments)     Developed a cough  . Enalapril   . Other     REVIEW OF SYSTEMS (negative unless checked):   Cardiac:  []  Chest pain or chest pressure? []  Shortness of breath upon activity? []  Shortness of breath when lying flat? []  Irregular heart rhythm?  Vascular:  [x]  Pain in calf, thigh, or hip brought on by walking? []  Pain in feet at night that wakes you up from your sleep? []  Blood clot in your veins? []  Leg swelling?  Pulmonary:  []  Oxygen at home? []  Productive cough? []  Wheezing?  Neurologic:  []  Sudden weakness in arms or legs? []  Sudden numbness in arms or legs? []  Sudden onset of difficult speaking or slurred speech? []  Temporary loss of vision in one eye? []  Problems with dizziness?  Gastrointestinal:  []  Blood in stool? []  Vomited blood?  Genitourinary:  []  Burning when urinating? []  Blood in urine?  Psychiatric:  []  Major depression  Hematologic:  []  Bleeding problems? []  Problems with blood clotting?  Dermatologic:  []  Rashes or ulcers?  Constitutional:  []  Fever or chills?  Ear/Nose/Throat:  []  Change in hearing? []  Nose bleeds? []  Sore throat?  Musculoskeletal:  [x]  Back pain? [x]  Joint pain? []  Muscle pain?   Physical Examination     Vitals:   12/14/20 1405  BP: 128/78  Pulse: 77  Resp: 20  Temp: 98.6 F (37 C)  TempSrc: Temporal  SpO2: 95%  Weight: 223 lb 9.6 oz (101.4 kg)  Height: 5\' 10"  (1.778 m)   Body mass index is 32.08 kg/m.  General:  WDWN in NAD; vital signs documented above Gait: Not observed HENT: WNL, normocephalic Pulmonary: normal non-labored breathing , without Rales, rhonchi,  wheezing Cardiac: regular HR, without  Murmurs Abdomen: soft, NT, no masses Skin: without rashes Vascular Exam/Pulses: 2+ dorsalis pedis, posterior tibial pulses bilaterally Extremities: without varicose veins, without reticular veins, without edema, without stasis pigmentation, without  lipodermatosclerosis, without ulcers Musculoskeletal: no muscle wasting or atrophy  Neurologic: A&O X 3;  No focal weakness or paresthesias are detected Psychiatric:  The pt has Normal affect.  Non-invasive Vascular Imaging   BLE Venous Insufficiency Duplex  Right:  - No evidence of deep vein thrombosis seen in the right lower extremity,  from the common femoral through the popliteal veins.  - No evidence of superficial venous thrombosis in the right lower  extremity.  - Venous reflux is noted in the right common femoral vein.  - Venous reflux is noted in the right sapheno-femoral junction.  - Venous reflux is noted in the right greater saphenous vein in the thigh.  - Venous reflux is noted in the right short saphenous vein.    Left:  - No evidence of deep vein thrombosis seen in the left lower extremity,  from the common femoral through the popliteal veins.  - No evidence of superficial venous thrombosis in the left lower  extremity.  - Venous reflux is noted in the left common femoral vein.  - Venous reflux is noted in the left sapheno-femoral junction.  - Venous reflux is noted in the left greater saphenous vein in the  proximal thigh.     Medical Decision Making   Noah Scott is a 85 y.o. male who presents with: BLE chronic venous insufficiency as evidenced by bilateral venous reflux right greater that left on duplex ultrasound. The greater saphenous vein in not sufficiently dilated to consider endovenous ablation therapy.  There is no ultrasound evidence of deep venous thrombosis or superficial venous thrombosis of either extremity.  He has palpable pedal pulses and no evidence of arterial insufficiency.  Based on the patient's history and examination, I recommend: Compression hose, daily elevation, weight loss and continued exercise program.  I discussed with the patient the use of  20-30 mm thigh high compression stockings and to don these in the morning prior to getting  out of bed.    Should he experience worsening symptoms and desire reevaluation in the future, we will be happy  to see him again.  Otherwise follow-up as necessary  Thank you for allowing [place] to participate in this patient's care.   Barbie Banner, PA-C [facility] Office: [phone]  12/14/2020, 2:28 PM  Clinic MD: Trula Slade on call

## 2020-12-20 ENCOUNTER — Emergency Department (HOSPITAL_BASED_OUTPATIENT_CLINIC_OR_DEPARTMENT_OTHER)
Admission: EM | Admit: 2020-12-20 | Discharge: 2020-12-20 | Discharge status: Against Medical Advice | Payer: Medicare Other

## 2020-12-20 ENCOUNTER — Other Ambulatory Visit: Payer: Self-pay

## 2020-12-20 ENCOUNTER — Telehealth (HOSPITAL_BASED_OUTPATIENT_CLINIC_OR_DEPARTMENT_OTHER): Payer: Self-pay | Admitting: Emergency Medicine

## 2020-12-20 NOTE — ED Notes (Signed)
Per registration Pt left, RN Crystal was informed

## 2020-12-20 NOTE — ED Notes (Signed)
Pt LWBS prior to triage. Pt was marked as triage complete in error, not by RN as listed.

## 2021-01-16 ENCOUNTER — Ambulatory Visit: Payer: Medicare Other | Admitting: Cardiology

## 2021-01-26 ENCOUNTER — Ambulatory Visit: Payer: Medicare Other | Admitting: Medical

## 2021-02-16 ENCOUNTER — Other Ambulatory Visit: Payer: Self-pay

## 2021-02-20 ENCOUNTER — Ambulatory Visit (INDEPENDENT_AMBULATORY_CARE_PROVIDER_SITE_OTHER): Payer: Medicare Other | Admitting: Cardiology

## 2021-02-20 ENCOUNTER — Other Ambulatory Visit: Payer: Self-pay

## 2021-02-20 ENCOUNTER — Encounter: Payer: Self-pay | Admitting: Cardiology

## 2021-02-20 VITALS — BP 112/74 | HR 97 | Ht 70.0 in | Wt 211.0 lb

## 2021-02-20 DIAGNOSIS — I10 Essential (primary) hypertension: Secondary | ICD-10-CM

## 2021-02-20 DIAGNOSIS — I2510 Atherosclerotic heart disease of native coronary artery without angina pectoris: Secondary | ICD-10-CM

## 2021-02-20 DIAGNOSIS — R0602 Shortness of breath: Secondary | ICD-10-CM | POA: Diagnosis not present

## 2021-02-20 DIAGNOSIS — E669 Obesity, unspecified: Secondary | ICD-10-CM | POA: Diagnosis not present

## 2021-02-20 DIAGNOSIS — R600 Localized edema: Secondary | ICD-10-CM

## 2021-02-20 MED ORDER — FUROSEMIDE 20 MG PO TABS: ORAL_TABLET | ORAL | 3 refills | Status: DC

## 2021-02-20 MED ORDER — POTASSIUM CHLORIDE CRYS ER 20 MEQ PO TBCR: 20.0000 meq | EXTENDED_RELEASE_TABLET | Freq: Every day | ORAL | 3 refills | Status: DC

## 2021-02-20 NOTE — Patient Instructions (Signed)
Medication Instructions:  Your physician has recommended you make the following change in your medication:  START: Lasix 40 mg every other day, 20 mg on odd day.  START: Potassium 20 meq daily *If you need a refill on your cardiac medications before your next appointment, please call your pharmacy*   Lab Work: Your physician recommends that you return for lab work: TODAY: BMET, Mag, BNP If you have labs (blood work) drawn today and your tests are completely normal, you will receive your results only by: Marland Kitchen MyChart Message (if you have MyChart) OR . A paper copy in the mail If you have any lab test that is abnormal or we need to change your treatment, we will call you to review the results.   Testing/Procedures: Your physician has requested that you have an echocardiogram. Echocardiography is a painless test that uses sound waves to create images of your heart. It provides your doctor with information about the size and shape of your heart and how well your heart's chambers and valves are working. This procedure takes approximately one hour. There are no restrictions for this procedure.     Follow-Up: At [facility], you and your health needs are our priority.  As part of our continuing mission to provide you with exceptional heart care, we have created designated Provider Care Teams.  These Care Teams include your primary Cardiologist (physician) and Advanced Practice Providers (APPs -  Physician Assistants and Nurse Practitioners) who all work together to provide you with the care you need, when you need it.  We recommend signing up for the patient portal called "MyChart".  Sign up information is provided on this After Visit Summary.  MyChart is used to connect with patients for Virtual Visits (Telemedicine).  Patients are able to view lab/test results, encounter notes, upcoming appointments, etc.  Non-urgent messages can be sent to your provider as well.   To learn more about what you  can do with MyChart, go to [email or web address].    Your next appointment:   3 month(s)  The format for your next appointment:   In Person   Provider:   Dr. Ellyn Hack   Other Instructions  Echocardiogram An echocardiogram is a test that uses sound waves (ultrasound) to produce images of the heart. Images from an echocardiogram can provide important information about:  Heart size and shape.  The size and thickness and movement of your heart's walls.  Heart muscle function and strength.  Heart valve function or if you have stenosis. Stenosis is when the heart valves are too narrow.  If blood is flowing backward through the heart valves (regurgitation).  A tumor or infectious growth around the heart valves.  Areas of heart muscle that are not working well because of poor blood flow or injury from a heart attack.  Aneurysm detection. An aneurysm is a weak or damaged part of an artery wall. The wall bulges out from the normal force of blood pumping through the body. Tell a health care provider about:  Any allergies you have.  All medicines you are taking, including vitamins, herbs, eye drops, creams, and over-the-counter medicines.  Any blood disorders you have.  Any surgeries you have had.  Any medical conditions you have.  Whether you are pregnant or may be pregnant. What are the risks? Generally, this is a safe test. However, problems may occur, including an allergic reaction to dye (contrast) that may be used during the test. What happens before the test? No specific preparation is  needed. You may eat and drink normally. What happens during the test?  You will take off your clothes from the waist up and put on a hospital gown.  Electrodes or electrocardiogram (ECG)patches may be placed on your chest. The electrodes or patches are then connected to a device that monitors your heart rate and rhythm.  You will lie down on a table for an ultrasound exam. A gel will  be applied to your chest to help sound waves pass through your skin.  A handheld device, called a transducer, will be pressed against your chest and moved over your heart. The transducer produces sound waves that travel to your heart and bounce back (or "echo" back) to the transducer. These sound waves will be captured in real-time and changed into images of your heart that can be viewed on a video monitor. The images will be recorded on a computer and reviewed by your health care provider.  You may be asked to change positions or hold your breath for a short time. This makes it easier to get different views or better views of your heart.  In some cases, you may receive contrast through an IV in one of your veins. This can improve the quality of the pictures from your heart. The procedure may vary among health care providers and hospitals.   What can I expect after the test? You may return to your normal, everyday life, including diet, activities, and medicines, unless your health care provider tells you not to do that. Follow these instructions at home:  It is up to you to get the results of your test. Ask your health care provider, or the department that is doing the test, when your results will be ready.  Keep all follow-up visits. This is important. Summary  An echocardiogram is a test that uses sound waves (ultrasound) to produce images of the heart.  Images from an echocardiogram can provide important information about the size and shape of your heart, heart muscle function, heart valve function, and other possible heart problems.  You do not need to do anything to prepare before this test. You may eat and drink normally.  After the echocardiogram is completed, you may return to your normal, everyday life, unless your health care provider tells you not to do that. This information is not intended to replace advice given to you by your health care provider. Make sure you discuss any  questions you have with your health care provider. Document Revised: 04/26/2020 Document Reviewed: 04/26/2020 Elsevier Patient Education  2021 [employer].

## 2021-02-20 NOTE — Progress Notes (Signed)
Cardiology Office Note:    Date:  02/20/2021   ID:  Noah Scott, DOB 05/30/1936, MRN [id number]  PCP:  Noah Pheasant, DO  Cardiologist:  Noah Hew, MD  Electrophysiologist:  None   Referring MD: Noah Scott,*   " I am having more swelling of my legs"  History of Present Illness:    Noah Scott is a 85 y.o. male with a hx of coronary artery disease status post PCI due to NSTEMI, hypertension, hyperlipidemia.  The patient follows with Noah Scott and was last seen by him in January 2022.  He tells me today at his visit was prompted due to the fact that he could not see Noah Scott as schedule in May 2022.  He then call for of follow-up visit and was pushed back to September.  He notes that he had requested to be seen early given his symptoms but this request was declined.  As a result of this the patient notes that his PCP referred him to our Noah Scott office.  Since his last visit he had called reporting that he was experiencing some fluttering, a monitor was placed on him by his primary cardiologist which was relatively normal.  He tells me today he has been experiencing worsening leg swelling.  He notes that at times he has some mild shortness of breath but no chest pain.  What is also noticeable to the patient is the fact that he is unable to lie flat.  Of note the patient reported that he is got generalized pain and back pain which he is being followed by the pain management clinic.   I spoke with the patient during our visit letting him know that it would be best that he follows up to see Noah Scott given this is his long-term cardiologist.  I explained to the patient that sometimes scheduling can be conflicting but it would be beneficial for his long-term relationship with Noah Scott to continue his cardiovascular care. He is agreeable.  Past Medical History:  Diagnosis Date  . CAD S/P percutaneous coronary angioplasty 2001   a) 2001 ([place]): PCI OM1 -  now with ~65% ISR.;; b) NSTEMI 11/2017 - ost-prox RCA 75-65% (Overlapping ONYX DES 3.0 x 26 & 3.0 x 8 --> 3.5-3.3 mm). m-dRCA 95% (ONYX DES 2.75 x 18 mm -> 3.0 mm). Ost OM1 65% ISR (med Rx). dRCA-OstRDA (50%-25%)  . Essential (primary) hypertension 11/21/2017  . Hyperlipidemia 11/21/2017  . Neurogenic claudication due to lumbar spinal stenosis 02/06/2018  . NSTEMI (non-ST elevated myocardial infarction) ([place]) 11/21/2017   Multiple RCA lesions - 2 Overlapping DES Ost-Prox RCA & 1 in mid-distal RCA    Past Surgical History:  Procedure Laterality Date  . CORONARY STENT INTERVENTION N/A 11/25/2017   Procedure: CORONARY STENT INTERVENTION;  Surgeon: Noah Man, MD;  Location: [facility];  Service: Cardiovascular:   m-dRCA 95%  (DES PCI - 0%.  Resolute ONYX DES 2.75 x 18 mm - ~3.0 mm). Ost-proxRCA 75-65% (DES PCI-0%. 2 overlapping Resolute ONYX DES 3.0 x 26 & 3.0 x 8 mm --> 3.5-3.3 mm)  . CORONARY STENT INTERVENTION  2001   ([place]): PCI OM1  . LEA DOPPLERS  12/12/2017   Normal bilateral ABIs and TBI's.  Marland Kitchen LEFT HEART CATH AND CORONARY ANGIOGRAPHY N/A 11/25/2017   Procedure: LEFT HEART CATH AND CORONARY ANGIOGRAPHY;  Surgeon: Noah Man, MD;  Location: [facility];  Service: Cardiovascular:  Ost-proxRCA 75&65%(DES PCI), m-dRCA 95% (  DES PCI), dRCA 50%-25% ost rPDA. ostOM1 65% ISR (from 2001).   . LUMBAR LAMINECTOMY/DECOMPRESSION MICRODISCECTOMY Bilateral 06/13/2018   Procedure: Laminectomy and Foraminotomy - L1-L2 - L2-L3 - L3-L4 - bilateral;  Surgeon: Noah Moore, MD;  Location: [facility];  Service: Neurosurgery;  Laterality: Bilateral;  . TRANSTHORACIC ECHOCARDIOGRAM  11/23/2017   Normal LV size and function.  EF 60 to 65%.  No RWMA.  GR 1 DD.  Mild RV dilation.  . TRANSTHORACIC ECHOCARDIOGRAM  06/02/2020   [facility]): Normal LV size and function.  EF 60 and 65%.  Normal RV size and function.  Aortic sclerosis with no stenosis.  No AI.  Mildly dilated ascending aorta.  CVP  estimated 5 mm.    Current Medications: Current Meds  Medication Sig  . Aromatic Inhalants (VICKS VAPOR INHALER IN) Inhale 1 Inhaler into the lungs as needed (stuffy nose).  Marland Kitchen doxazosin (CARDURA) 8 MG tablet Take 8 mg by mouth daily.  . furosemide (LASIX) 20 MG tablet Take 40 mg (two tablets) every other day, 20 mg (one tablet) on odd day.  . gabapentin (NEURONTIN) 100 MG capsule Take 100 mg by mouth at bedtime.  Marland Kitchen losartan (COZAAR) 50 MG tablet Take 1 tablet (50 mg total) by mouth daily.  . metoprolol tartrate (LOPRESSOR) 25 MG tablet Take 0.5 tablets (12.5 mg total) by mouth 2 (two) times daily.  . nitroGLYCERIN (NITROSTAT) 0.4 MG SL tablet Place 1 tablet (0.4 mg total) under the tongue every 5 (five) minutes as needed for chest pain.  . potassium chloride SA (KLOR-CON M20) 20 MEQ tablet Take 1 tablet (20 mEq total) by mouth daily.  . prasugrel (EFFIENT) 10 MG TABS tablet Take 1 tablet (10 mg total) by mouth daily.  . traMADol (ULTRAM) 50 MG tablet Take 1 tablet by mouth every 6 (six) hours as needed for pain.  . [DISCONTINUED] furosemide (LASIX) 20 MG tablet TAKE 1 TABLET(20 MG) BY MOUTH DAILY     Allergies:   Plavix [clopidogrel bisulfate], Brilinta [ticagrelor], Enalapril, and Other   Social History   Socioeconomic History  . Marital status: Widowed    Spouse name: Not on file  . Number of children: 1  . Years of education: 23  . Highest education level: Not on file  Occupational History  . Occupation: retired Geophysical data processor  Tobacco Use  . Smoking status: Former Smoker    Quit date: 03/02/1969    Years since quitting: 52.0  . Smokeless tobacco: Never Used  Vaping Use  . Vaping Use: Never used  Substance and Sexual Activity  . Alcohol use: Not Currently  . Drug use: No  . Sexual activity: Not on file  Other Topics Concern  . Not on file  Social History Narrative   Lives alone in an apartment on the first floor.  Has one daughter.  Retired Medical sales representative.  Education: some college.    Social Determinants of Health   Financial Resource Strain: Not on file  Food Insecurity: Not on file  Transportation Needs: Not on file  Physical Activity: Not on file  Stress: Not on file  Social Connections: Not on file     Family History: The patient's family history includes Heart disease in his father. There is no history of Stroke or Diabetes.  ROS:   Review of Systems  Constitution: Negative for decreased appetite, fever and weight gain.  HENT: Negative for congestion, ear discharge, hoarse voice and sore throat.   Eyes: Negative for discharge, redness, vision  loss in right eye and visual halos.  Cardiovascular: Reports bilateral leg edema . negative for chest pain, dyspnea on exertion, leg swelling, orthopnea and palpitations.  Respiratory: Reports shortness of breath.  Negative for cough, hemoptysis, and snoring.   Endocrine: Negative for heat intolerance and polyphagia.  Hematologic/Lymphatic: Negative for bleeding problem. Does not bruise/bleed easily.  Skin: Negative for flushing, nail changes, rash and suspicious lesions.  Musculoskeletal: Negative for arthritis, joint pain, muscle cramps, myalgias, neck pain and stiffness.  Gastrointestinal: Negative for abdominal pain, bowel incontinence, diarrhea and excessive appetite.  Genitourinary: Negative for decreased libido, genital sores and incomplete emptying.  Neurological: Negative for brief paralysis, focal weakness, headaches and loss of balance.  Psychiatric/Behavioral: Negative for altered mental status, depression and suicidal ideas.  Allergic/Immunologic: Negative for HIV exposure and persistent infections.    EKGs/Labs/Other Studies Reviewed:    The following studies were reviewed today:   EKG:  The ekg ordered today demonstrates sinus rhythm, heart rate 97 bpm with left anterior fascicular block.  Paired to prior EKG done in January 2022 no significant  change.  Recent Labs: 10/19/2020: ALT 12  Recent Lipid Panel    Component Value Date/Time   CHOL 118 10/19/2020 1024   TRIG 71 10/19/2020 1024   HDL 41 10/19/2020 1024   CHOLHDL 2.9 10/19/2020 1024   LDLCALC 62 10/19/2020 1024    Physical Exam:    VS:  BP 112/74 (BP Location: Right Arm)   Pulse 97   Ht 5\' 10"  (1.778 m)   Wt 211 lb (95.7 kg)   SpO2 96%   BMI 30.28 kg/m     Wt Readings from Last 3 Encounters:  02/20/21 211 lb (95.7 kg)  12/14/20 223 lb 9.6 oz (101.4 kg)  09/20/20 226 lb 9.6 oz (102.8 kg)     GEN: Well nourished, well developed in no acute distress HEENT: Normal NECK: No JVD; No carotid bruits LYMPHATICS: No lymphadenopathy CARDIAC: S1S2 noted,RRR, no murmurs, rubs, gallops RESPIRATORY:  Clear to auscultation without rales, wheezing or rhonchi  ABDOMEN: Soft, non-tender, non-distended, +bowel sounds, no guarding. EXTREMITIES: No edema, No cyanosis, no clubbing MUSCULOSKELETAL:  No deformity  SKIN: Warm and dry NEUROLOGIC:  Alert and oriented x 3, non-focal PSYCHIATRIC:  Normal affect, good insight  ASSESSMENT:    1. SOB (shortness of breath)   2. Bilateral leg edema   3. Coronary artery disease involving native coronary artery of native heart, unspecified whether angina present   4. Obesity (BMI 30-39.9)   5. Essential (primary) hypertension    PLAN:    I will increase his Lasix from 20 mg daily to 40 mg every other day.  We will still continue to take his 20 mg on subsequent days.  With potassium supplement.  Blood work today for [employer], mag, BNP.  Will get a repeat echocardiogram to make sure cardiomyopathy is not playing a role review his echocardiogram which was done in 2019 showed normal EF, grade 1 diastolic dysfunction and no valvular abnormalities.  He denies any anginal symptoms no changes will be made to his regimen, he will continue his prasugrel 10 mg daily, Crestor 20 mg daily.  His lipid profile was reviewed viewed which was done on  October 19, 2020, HDL 41, LDL 62, total cholesterol 111 and triglycerides 71.  I encouraged the patient that he needs to go back to see his long-term cardiologist Noah Scott, and he is agreeable for this.  He will see Noah Scott and his upcoming visit will  be in September 2022.  The patient is in agreement with the above plan. The patient left the office in stable condition.  The patient will follow up with Noah Scott.   Medication Adjustments/Labs and Tests Ordered: Current medicines are reviewed at length with the patient today.  Concerns regarding medicines are outlined above.  Orders Placed This Encounter  Procedures  . Basic metabolic panel  . Magnesium  . Pro b natriuretic peptide (BNP)  . EKG 12-Lead  . ECHOCARDIOGRAM COMPLETE   Meds ordered this encounter  Medications  . furosemide (LASIX) 20 MG tablet    Sig: Take 40 mg (two tablets) every other day, 20 mg (one tablet) on odd day.    Dispense:  180 tablet    Refill:  3  . potassium chloride SA (KLOR-CON M20) 20 MEQ tablet    Sig: Take 1 tablet (20 mEq total) by mouth daily.    Dispense:  90 tablet    Refill:  3    Patient Instructions   Medication Instructions:  Your physician has recommended you make the following change in your medication:  START: Lasix 40 mg every other day, 20 mg on odd day.  START: Potassium 20 meq daily *If you need a refill on your cardiac medications before your next appointment, please call your pharmacy*   Lab Work: Your physician recommends that you return for lab work: TODAY: BMET, Mag, BNP If you have labs (blood work) drawn today and your tests are completely normal, you will receive your results only by: Marland Kitchen MyChart Message (if you have MyChart) OR . A paper copy in the mail If you have any lab test that is abnormal or we need to change your treatment, we will call you to review the results.   Testing/Procedures: Your physician has requested that you have an echocardiogram.  Echocardiography is a painless test that uses sound waves to create images of your heart. It provides your doctor with information about the size and shape of your heart and how well your heart's chambers and valves are working. This procedure takes approximately one hour. There are no restrictions for this procedure.     Follow-Up: At [facility], you and your health needs are our priority.  As part of our continuing mission to provide you with exceptional heart care, we have created designated Provider Care Teams.  These Care Teams include your primary Cardiologist (physician) and Advanced Practice Providers (APPs -  Physician Assistants and Nurse Practitioners) who all work together to provide you with the care you need, when you need it.  We recommend signing up for the patient portal called "MyChart".  Sign up information is provided on this After Visit Summary.  MyChart is used to connect with patients for Virtual Visits (Telemedicine).  Patients are able to view lab/test results, encounter notes, upcoming appointments, etc.  Non-urgent messages can be sent to your provider as well.   To learn more about what you can do with MyChart, go to [email or web address].    Your next appointment:   3 month(s)  The format for your next appointment:   In Person   Provider:   Dr. Ellyn Scott   Other Instructions  Echocardiogram An echocardiogram is a test that uses sound waves (ultrasound) to produce images of the heart. Images from an echocardiogram can provide important information about:  Heart size and shape.  The size and thickness and movement of your heart's walls.  Heart muscle function and strength.  Heart valve function  or if you have stenosis. Stenosis is when the heart valves are too narrow.  If blood is flowing backward through the heart valves (regurgitation).  A tumor or infectious growth around the heart valves.  Areas of heart muscle that are not working well  because of poor blood flow or injury from a heart attack.  Aneurysm detection. An aneurysm is a weak or damaged part of an artery wall. The wall bulges out from the normal force of blood pumping through the body. Tell a health care provider about:  Any allergies you have.  All medicines you are taking, including vitamins, herbs, eye drops, creams, and over-the-counter medicines.  Any blood disorders you have.  Any surgeries you have had.  Any medical conditions you have.  Whether you are pregnant or may be pregnant. What are the risks? Generally, this is a safe test. However, problems may occur, including an allergic reaction to dye (contrast) that may be used during the test. What happens before the test? No specific preparation is needed. You may eat and drink normally. What happens during the test?  You will take off your clothes from the waist up and put on a hospital gown.  Electrodes or electrocardiogram (ECG)patches may be placed on your chest. The electrodes or patches are then connected to a device that monitors your heart rate and rhythm.  You will lie down on a table for an ultrasound exam. A gel will be applied to your chest to help sound waves pass through your skin.  A handheld device, called a transducer, will be pressed against your chest and moved over your heart. The transducer produces sound waves that travel to your heart and bounce back (or "echo" back) to the transducer. These sound waves will be captured in real-time and changed into images of your heart that can be viewed on a video monitor. The images will be recorded on a computer and reviewed by your health care provider.  You may be asked to change positions or hold your breath for a short time. This makes it easier to get different views or better views of your heart.  In some cases, you may receive contrast through an IV in one of your veins. This can improve the quality of the pictures from your  heart. The procedure may vary among health care providers and hospitals.   What can I expect after the test? You may return to your normal, everyday life, including diet, activities, and medicines, unless your health care provider tells you not to do that. Follow these instructions at home:  It is up to you to get the results of your test. Ask your health care provider, or the department that is doing the test, when your results will be ready.  Keep all follow-up visits. This is important. Summary  An echocardiogram is a test that uses sound waves (ultrasound) to produce images of the heart.  Images from an echocardiogram can provide important information about the size and shape of your heart, heart muscle function, heart valve function, and other possible heart problems.  You do not need to do anything to prepare before this test. You may eat and drink normally.  After the echocardiogram is completed, you may return to your normal, everyday life, unless your health care provider tells you not to do that. This information is not intended to replace advice given to you by your health care provider. Make sure you discuss any questions you have with your health care  provider. Document Revised: 04/26/2020 Document Reviewed: 04/26/2020 Elsevier Patient Education  2021 [employer].      Adopting a Healthy Lifestyle.  Know what a healthy weight is for you (roughly BMI <25) and aim to maintain this   Aim for 7+ servings of fruits and vegetables daily   65-80+ fluid ounces of water or unsweet tea for healthy kidneys   Limit to max 1 drink of alcohol per day; avoid smoking/tobacco   Limit animal fats in diet for cholesterol and heart health - choose grass fed whenever available   Avoid highly processed foods, and foods high in saturated/trans fats   Aim for low stress - take time to unwind and care for your mental health   Aim for 150 min of moderate intensity exercise weekly for  heart health, and weights twice weekly for bone health   Aim for 7-9 hours of sleep daily   When it comes to diets, agreement about the perfect plan isnt easy to find, even among the experts. Experts at the [employer] developed an idea known as the Healthy Eating Plate. Just imagine a plate divided into logical, healthy portions.   The emphasis is on diet quality:   Load up on vegetables and fruits - one-half of your plate: Aim for color and variety, and remember that potatoes dont count.   Go for whole grains - one-quarter of your plate: Whole wheat, barley, wheat berries, quinoa, oats, brown rice, and foods made with them. If you want pasta, go with whole wheat pasta.   Protein power - one-quarter of your plate: Fish, chicken, beans, and nuts are all healthy, versatile protein sources. Limit red meat.   The diet, however, does go beyond the plate, offering a few other suggestions.   Use healthy plant oils, such as olive, canola, soy, corn, sunflower and peanut. Check the labels, and avoid partially hydrogenated oil, which have unhealthy trans fats.   If youre thirsty, drink water. Coffee and tea are good in moderation, but skip sugary drinks and limit milk and dairy products to one or two daily servings.   The type of carbohydrate in the diet is more important than the amount. Some sources of carbohydrates, such as vegetables, fruits, whole grains, and beans-are healthier than others.   Finally, stay active  Signed, Berniece Salines, DO  02/20/2021 2:59 PM    [facility]

## 2021-02-21 ENCOUNTER — Telehealth: Payer: Self-pay

## 2021-02-21 LAB — BASIC METABOLIC PANEL
BUN/Creatinine Ratio: 16 (ref 10–24)
BUN: 16 mg/dL (ref 8–27)
CO2: 22 mmol/L (ref 20–29)
Calcium: 8.5 mg/dL — ABNORMAL LOW (ref 8.6–10.2)
Chloride: 100 mmol/L (ref 96–106)
Creatinine, Ser: 1.01 mg/dL (ref 0.76–1.27)
Glucose: 118 mg/dL — ABNORMAL HIGH (ref 65–99)
Potassium: 3.6 mmol/L (ref 3.5–5.2)
Sodium: 139 mmol/L (ref 134–144)
eGFR: 74 mL/min/{1.73_m2} (ref >59)

## 2021-02-21 LAB — MAGNESIUM: Magnesium: 2.1 mg/dL (ref 1.6–2.3)

## 2021-02-21 LAB — PRO B NATRIURETIC PEPTIDE: NT-Pro BNP: 216 pg/mL (ref 0–486)

## 2021-02-21 NOTE — Telephone Encounter (Signed)
-----   Message from Berniece Salines, DO sent at 02/21/2021  3:53 PM EDT ----- Labs stable

## 2021-02-21 NOTE — Telephone Encounter (Signed)
Patient notified of results.

## 2021-03-26 ENCOUNTER — Other Ambulatory Visit: Payer: Self-pay

## 2021-03-26 ENCOUNTER — Emergency Department (HOSPITAL_BASED_OUTPATIENT_CLINIC_OR_DEPARTMENT_OTHER)
Admission: EM | Admit: 2021-03-26 | Discharge: 2021-03-26 | Disposition: A | Discharge status: Home / Self Care | Payer: Medicare Other | Attending: Emergency Medicine | Admitting: Emergency Medicine

## 2021-03-26 ENCOUNTER — Encounter (HOSPITAL_BASED_OUTPATIENT_CLINIC_OR_DEPARTMENT_OTHER): Payer: Self-pay | Admitting: Emergency Medicine

## 2021-03-26 DIAGNOSIS — I2510 Atherosclerotic heart disease of native coronary artery without angina pectoris: Secondary | ICD-10-CM | POA: Diagnosis not present

## 2021-03-26 DIAGNOSIS — M5431 Sciatica, right side: Secondary | ICD-10-CM | POA: Diagnosis not present

## 2021-03-26 DIAGNOSIS — M5432 Sciatica, left side: Secondary | ICD-10-CM | POA: Insufficient documentation

## 2021-03-26 DIAGNOSIS — I10 Essential (primary) hypertension: Secondary | ICD-10-CM | POA: Insufficient documentation

## 2021-03-26 DIAGNOSIS — Z79899 Other long term (current) drug therapy: Secondary | ICD-10-CM | POA: Insufficient documentation

## 2021-03-26 DIAGNOSIS — Z87891 Personal history of nicotine dependence: Secondary | ICD-10-CM | POA: Insufficient documentation

## 2021-03-26 DIAGNOSIS — M79604 Pain in right leg: Secondary | ICD-10-CM | POA: Diagnosis present

## 2021-03-26 LAB — CBC WITH DIFFERENTIAL/PLATELET
Abs Immature Granulocytes: 0.03 10*3/uL (ref 0.00–0.07)
Basophils Absolute: 0 10*3/uL (ref 0.0–0.1)
Basophils Relative: 0 %
Eosinophils Absolute: 0 10*3/uL (ref 0.0–0.5)
Eosinophils Relative: 1 %
HCT: 32.5 % — ABNORMAL LOW (ref 39.0–52.0)
Hemoglobin: 10.7 g/dL — ABNORMAL LOW (ref 13.0–17.0)
Immature Granulocytes: 1 %
Lymphocytes Relative: 13 %
Lymphs Abs: 0.6 10*3/uL — ABNORMAL LOW (ref 0.7–4.0)
MCH: 28.8 pg (ref 26.0–34.0)
MCHC: 32.9 g/dL (ref 30.0–36.0)
MCV: 87.6 fL (ref 80.0–100.0)
Monocytes Absolute: 0.4 10*3/uL (ref 0.1–1.0)
Monocytes Relative: 9 %
Neutro Abs: 3.5 10*3/uL (ref 1.7–7.7)
Neutrophils Relative %: 76 %
Platelets: 220 10*3/uL (ref 150–400)
RBC: 3.71 MIL/uL — ABNORMAL LOW (ref 4.22–5.81)
RDW: 13.8 % (ref 11.5–15.5)
WBC: 4.5 10*3/uL (ref 4.0–10.5)
nRBC: 0.4 % — ABNORMAL HIGH (ref 0.0–0.2)

## 2021-03-26 LAB — BASIC METABOLIC PANEL
Anion gap: 7 (ref 5–15)
BUN: 16 mg/dL (ref 8–23)
CO2: 24 mmol/L (ref 22–32)
Calcium: 8.3 mg/dL — ABNORMAL LOW (ref 8.9–10.3)
Chloride: 105 mmol/L (ref 98–111)
Creatinine, Ser: 1.13 mg/dL (ref 0.61–1.24)
GFR, Estimated: >60 mL/min (ref >60)
Glucose, Bld: 147 mg/dL — ABNORMAL HIGH (ref 70–99)
Potassium: 3.4 mmol/L — ABNORMAL LOW (ref 3.5–5.1)
Sodium: 136 mmol/L (ref 135–145)

## 2021-03-26 MED ORDER — HYDROCODONE-ACETAMINOPHEN 5-325 MG PO TABS
1.0000 | ORAL_TABLET | Freq: Once | ORAL | Status: AC
  Administered 2021-03-26: 1 via ORAL
  Filled 2021-03-26: qty 1

## 2021-03-26 MED ORDER — SODIUM CHLORIDE 0.9 % IV BOLUS
1000.0000 mL | Freq: Once | INTRAVENOUS | Status: AC
  Administered 2021-03-26: 1000 mL via INTRAVENOUS

## 2021-03-26 MED ORDER — HYDROCODONE-ACETAMINOPHEN 5-325 MG PO TABS: 1.0000 | ORAL_TABLET | ORAL | 0 refills | Status: DC | PRN

## 2021-03-26 NOTE — ED Triage Notes (Signed)
Pt arrives pov with c/o lower back pain that radiates bilaterally to legs. Reports spinal surgery in 2019. Pt reports need for cane with ambulation x 2 weeks. Reports pain affecting sleep

## 2021-03-26 NOTE — ED Provider Notes (Signed)
Exam [facility] EMERGENCY DEPARTMENT Provider Note   CSN: [id number] Arrival date & time: 03/26/21  0102     History Chief Complaint  Patient presents with   Back Pain    Noah Scott is a 85 y.o. male.  HPI 85 year old male presents with bilateral leg pain.  He has been having difficulty sleeping for the last couple weeks despite being given trazodone.  He has had progressive bilateral medial leg pain that has been told is coming from his back for about 3 years.  He had an operation but has had progressive pain since.  He has chronically been on tramadol but states he just recently ran out a few days ago and was told that it would not be renewed and he needs to get a pain specialist.  He has not had any weakness or numbness in his extremities, chest pain.  No significant back pain.  No abdominal pain.  He denies fevers, weight loss, bowel/bladder incontinence.  Previously he has been given steroids with partial relief.  He has been on Neurontin which did not help as well as a muscle relaxer.  He states he needs something for the pain as it has been progressively worsening and interfering with sleep and some activities.  He is noted to have some soft blood pressures and states that his typical blood pressure is in the 110s/low 100s.  He denies feeling weak or dizzy currently.  Past Medical History:  Diagnosis Date   CAD S/P percutaneous coronary angioplasty 2001   a) 2001 ([place]): PCI OM1 - now with ~65% ISR.;; b) NSTEMI 11/2017 - ost-prox RCA 75-65% (Overlapping ONYX DES 3.0 x 26 & 3.0 x 8 --> 3.5-3.3 mm). m-dRCA 95% (ONYX DES 2.75 x 18 mm -> 3.0 mm). Ost OM1 65% ISR (med Rx). dRCA-OstRDA (50%-25%)   Essential (primary) hypertension 11/21/2017   Hyperlipidemia 11/21/2017   Neurogenic claudication due to lumbar spinal stenosis 02/06/2018   NSTEMI (non-ST elevated myocardial infarction) ([place]) 11/21/2017   Multiple RCA lesions - 2 Overlapping DES Ost-Prox RCA & 1 in mid-distal RCA     Patient Active Problem List   Diagnosis Date Noted   SOB (shortness of breath) 02/20/2021   Bilateral leg edema 02/20/2021   Coronary artery disease involving native coronary artery of native heart 02/20/2021   Obesity (BMI 30-39.9) 02/20/2021   Leg pain, bilateral 09/20/2020   Fluttering heart 09/20/2020   Episodic lightheadedness 09/20/2020   Dyspnea 11/26/2018   S/P lumbar laminectomy 06/13/2018   Preoperative clearance 72/53/6644   Metabolic syndrome 03/47/4259   Obesity (BMI 30.0-34.9) 03/15/2018   Neurogenic claudication due to lumbar spinal stenosis 02/06/2018   NSTEMI (non-ST elevated myocardial infarction) ([place]) 11/21/2017   Essential (primary) hypertension 11/21/2017   Hyperlipidemia with target LDL less than 70 11/21/2017   Hyperlipidemia 11/21/2017   CAD S/P percutaneous coronary angioplasty 09/18/1999    Past Surgical History:  Procedure Laterality Date   CORONARY STENT INTERVENTION N/A 11/25/2017   Procedure: CORONARY STENT INTERVENTION;  Surgeon: Leonie Man, MD;  Location: [facility];  Service: Cardiovascular:   m-dRCA 95%  (DES PCI - 0%.  Resolute ONYX DES 2.75 x 18 mm - ~3.0 mm). Ost-proxRCA 75-65% (DES PCI-0%. 2 overlapping Resolute ONYX DES 3.0 x 26 & 3.0 x 8 mm --> 3.5-3.3 mm)   CORONARY STENT INTERVENTION  2001   ([place]): PCI OM1   LEA DOPPLERS  12/12/2017   Normal bilateral ABIs and TBI's.   LEFT HEART  CATH AND CORONARY ANGIOGRAPHY N/A 11/25/2017   Procedure: LEFT HEART CATH AND CORONARY ANGIOGRAPHY;  Surgeon: Leonie Man, MD;  Location: [facility];  Service: Cardiovascular:  Ost-proxRCA 75&65%(DES PCI), m-dRCA 95% (DES PCI), dRCA 50%-25% ost rPDA. ostOM1 65% ISR (from 2001).    LUMBAR LAMINECTOMY/DECOMPRESSION MICRODISCECTOMY Bilateral 06/13/2018   Procedure: Laminectomy and Foraminotomy - L1-L2 - L2-L3 - L3-L4 - bilateral;  Surgeon: Eustace Moore, MD;  Location: [facility];  Service: Neurosurgery;  Laterality: Bilateral;    TRANSTHORACIC ECHOCARDIOGRAM  11/23/2017   Normal LV size and function.  EF 60 to 65%.  No RWMA.  GR 1 DD.  Mild RV dilation.   TRANSTHORACIC ECHOCARDIOGRAM  06/02/2020   [facility]): Normal LV size and function.  EF 60 and 65%.  Normal RV size and function.  Aortic sclerosis with no stenosis.  No AI.  Mildly dilated ascending aorta.  CVP estimated 5 mm.       Family History  Problem Relation Age of Onset   Heart disease Father        Pt does not know details   Stroke Neg Hx    Diabetes Neg Hx     Social History   Tobacco Use   Smoking status: Former    Pack years: 0.00    Types: Cigarettes    Quit date: 03/02/1969    Years since quitting: 52.1   Smokeless tobacco: Never  Vaping Use   Vaping Use: Never used  Substance Use Topics   Alcohol use: Not Currently   Drug use: No    Home Medications Prior to Admission medications   Medication Sig Start Date End Date Taking? Authorizing Provider  HYDROcodone-acetaminophen (NORCO) 5-325 MG tablet Take 1 tablet by mouth every 4 (four) hours as needed. 03/26/21  Yes Sherwood Gambler, MD  Aromatic Inhalants (VICKS VAPOR INHALER IN) Inhale 1 Inhaler into the lungs as needed (stuffy nose).    [provider]  doxazosin (CARDURA) 8 MG tablet Take 8 mg by mouth daily.    [provider]  furosemide (LASIX) 20 MG tablet Take 40 mg (two tablets) every other day, 20 mg (one tablet) on odd day. 02/20/21   Tobb, Kardie, DO  gabapentin (NEURONTIN) 100 MG capsule Take 100 mg by mouth at bedtime. 11/10/20   [provider]  losartan (COZAAR) 50 MG tablet Take 1 tablet (50 mg total) by mouth daily. 11/26/17   Barrett, Evelene Croon, PA-C  metoprolol tartrate (LOPRESSOR) 25 MG tablet Take 0.5 tablets (12.5 mg total) by mouth 2 (two) times daily. 12/04/17   Leonie Man, MD  nitroGLYCERIN (NITROSTAT) 0.4 MG SL tablet Place 1 tablet (0.4 mg total) under the tongue every 5 (five) minutes as needed for chest pain. 11/26/17   Barrett, Evelene Croon, PA-C  potassium chloride SA (KLOR-CON M20) 20 MEQ tablet Take 1 tablet (20 mEq total) by mouth daily. 02/20/21 05/21/21  Tobb, Kardie, DO  prasugrel (EFFIENT) 10 MG TABS tablet Take 1 tablet (10 mg total) by mouth daily. 08/10/20   Leonie Man, MD  rosuvastatin (CRESTOR) 20 MG tablet Take 1 tablet (20 mg total) by mouth daily. 07/21/20 10/19/20  Leonie Man, MD    Allergies    Plavix [clopidogrel bisulfate], Brilinta [ticagrelor], Enalapril, and Other  Review of Systems   Review of Systems  Constitutional:  Negative for fever.  Gastrointestinal:  Negative for abdominal pain.  Musculoskeletal:  Negative for back pain.  Neurological:  Negative for weakness and  numbness.  All other systems reviewed and are negative.  Physical Exam Updated Vital Signs BP 104/81   Pulse 64   Temp 98.5 F (36.9 C) (Oral)   Resp 18   Ht 5\' 10"  (1.778 m)   Wt 88.5 kg   SpO2 98%   BMI 27.98 kg/m   Physical Exam Vitals and nursing note reviewed.  Constitutional:      General: He is not in acute distress.    Appearance: He is well-developed. He is not ill-appearing or diaphoretic.  HENT:     Head: Normocephalic and atraumatic.     Right Ear: External ear normal.     Left Ear: External ear normal.     Nose: Nose normal.  Eyes:     General:        Right eye: No discharge.        Left eye: No discharge.  Cardiovascular:     Rate and Rhythm: Normal rate and regular rhythm.     Pulses:          Dorsalis pedis pulses are 2+ on the right side and 2+ on the left side.     Heart sounds: Normal heart sounds.  Pulmonary:     Effort: Pulmonary effort is normal.  Abdominal:     General: There is no distension.     Palpations: Abdomen is soft.     Tenderness: There is no abdominal tenderness.  Musculoskeletal:     Cervical back: Neck supple.     Comments: No tenderness or swelling to BLE or low back. Well healed lumbar scar. 5/5 strength in BLE. Grossly normal sensation  Skin:    General:  Skin is warm and dry.  Neurological:     Mental Status: He is alert.  Psychiatric:        Mood and Affect: Mood is not anxious.    ED Results / Procedures / Treatments   Labs (all labs ordered are listed, but only abnormal results are displayed) Labs Reviewed  BASIC METABOLIC PANEL - Abnormal; Notable for the following components:      Result Value   Potassium 3.4 (*)    Glucose, Bld 147 (*)    Calcium 8.3 (*)    All other components within normal limits  CBC WITH DIFFERENTIAL/PLATELET - Abnormal; Notable for the following components:   RBC 3.71 (*)    Hemoglobin 10.7 (*)    HCT 32.5 (*)    nRBC 0.4 (*)    Lymphs Abs 0.6 (*)    All other components within normal limits    EKG None  Radiology No results found.  Procedures Procedures   Medications Ordered in ED Medications  sodium chloride 0.9 % bolus 1,000 mL (1,000 mLs Intravenous New Bag/Given 03/26/21 1049)  HYDROcodone-acetaminophen (NORCO/VICODIN) 5-325 MG per tablet 1 tablet (1 tablet Oral Given 03/26/21 1035)    ED Course  I have reviewed the triage vital signs and the nursing notes.  Pertinent labs & imaging results that were available during my care of the patient were reviewed by me and considered in my medical decision making (see chart for details).    MDM Rules/Calculators/A&P                          Patient has no evidence of neurologic compromise to suggest needing an emergent MRI.  Is unremarkable.  He has some soft blood pressures including in the 90s though he states he runs a  little bit low like in the low 100s.  He was recently put on Lasix and labs were obtained.  He has mild anemia compared to baseline but the most recent check was from a few years ago.  At this point, I think is reasonable to treat him with short-term pain medication and he will need follow-up with either PCP or pain specialist.  Return if symptoms worsen or new symptoms develop. Final Clinical Impression(s) / ED  Diagnoses Final diagnoses:  Bilateral sciatica    Rx / DC Orders ED Discharge Orders          Ordered    HYDROcodone-acetaminophen (NORCO) 5-325 MG tablet  Every 4 hours PRN        03/26/21 1144             Sherwood Gambler, MD 03/26/21 1145

## 2021-03-26 NOTE — Discharge Instructions (Addendum)
If you develop worsening, recurrent, or continued back pain, numbness or weakness in the legs, incontinence of your bowels or bladders, numbness of your buttocks, fever, abdominal pain, or any other new/concerning symptoms then return to the ER for evaluation.  

## 2021-03-31 ENCOUNTER — Ambulatory Visit (HOSPITAL_BASED_OUTPATIENT_CLINIC_OR_DEPARTMENT_OTHER)
Admission: RE | Admit: 2021-03-31 | Discharge: 2021-03-31 | Disposition: A | Discharge status: Home / Self Care | Payer: Medicare Other | Source: Ambulatory Visit | Attending: Cardiology | Admitting: Cardiology

## 2021-03-31 ENCOUNTER — Other Ambulatory Visit: Payer: Self-pay

## 2021-03-31 DIAGNOSIS — R0602 Shortness of breath: Secondary | ICD-10-CM | POA: Diagnosis present

## 2021-03-31 DIAGNOSIS — R600 Localized edema: Secondary | ICD-10-CM | POA: Diagnosis present

## 2021-03-31 HISTORY — PX: TRANSTHORACIC ECHOCARDIOGRAM: SHX275

## 2021-03-31 LAB — ECHOCARDIOGRAM COMPLETE
Area-P 1/2: 2.17 cm2
S' Lateral: 2.13 cm

## 2021-04-18 ENCOUNTER — Telehealth: Payer: Self-pay | Admitting: Cardiology

## 2021-04-18 NOTE — Telephone Encounter (Signed)
Returned the call to the patient. He stated that today when he went for his pain medication appointment that his blood pressure was 80/55 and heart rate was 49. They advised him to reach out to cardiology.   He stated that he is asymptomatic. He does not have any way to check his readings at home but has not felt bad.  He currently takes: Cardura 8 mg once daily Losartan 50 mg  Metoprolol 12.5 mg bid Furosemide 20 mg once daily  He has been advised that it may be beneficial to get a home blood pressure machine and check his blood pressure daily. He has a follow up appointment on 05/24/21 with Dr. Ellyn Hack.

## 2021-04-18 NOTE — Telephone Encounter (Signed)
Pt c/o BP issue: STAT if pt c/o blurred vision, one-sided weakness or slurred speech  1. What are your last 5 BP readings?  74/49 80/55  2. Are you having any other symptoms (ex. Dizziness, headache, blurred vision, passed out)?  No   3. What is your BP issue?   Patient states today he saw his pain management specialist. She informed him that his BP has been extremely low. He states she took a mechanical and manual reading. Patient states he is asymptomatic. No dizziness. No pain.

## 2021-04-20 NOTE — Telephone Encounter (Signed)
Lets have him cut his losartan dose in half.  Glenetta Hew, MD

## 2021-04-21 MED ORDER — LOSARTAN POTASSIUM 25 MG PO TABS: 25.0000 mg | ORAL_TABLET | Freq: Every day | ORAL | 5 refills | Status: DC

## 2021-04-21 NOTE — Telephone Encounter (Signed)
Patient has been made aware. New script has been sent in.

## 2021-05-04 ENCOUNTER — Telehealth: Payer: Self-pay | Admitting: Cardiology

## 2021-05-04 NOTE — Telephone Encounter (Signed)
   Pt c/o swelling: STAT is pt has developed SOB within 24 hours  If swelling, where is the swelling located?   How much weight have you gained and in what time span?   Have you gained 3 pounds in a day or 5 pounds in a week?   Do you have a log of your daily weights (if so, list)?   Are you currently taking a fluid pill? Yes   Are you currently SOB? No  Have you traveled recently? No  Pt said he's is retaining fluids and he can feel it on his lungs but denied SOB, he said he did gained a bunch of weight but did not provide daily weights. He is requesting to speak with a nurs

## 2021-05-04 NOTE — Telephone Encounter (Signed)
Spoke to patient. Patient states early this week. He had frequent urination  Sunday and Monday. Per Patient,he did not sleep. He states could not get comfortable - -little short of breath.  Patient states urine had an odor. Dark at times and clear at times.  Patient states he weighs himself but could not give me any daily weight numbers.    Patient states now he does not have any issues "back to normal"  patient is using 20 mg /40 mg every other day of lasix as prescribed at last appointment on 02/20/21 with Dr Harriet Masson. Per patient he has conitnue taking this dose since last visit.  RN  moved appointment to 05/11/21 with Dr Ellyn Hack. Patient verbalized understanding and aware to bring a medication bottles or current list ,as well as daily weight log

## 2021-05-11 ENCOUNTER — Encounter: Payer: Self-pay | Admitting: Cardiology

## 2021-05-11 ENCOUNTER — Other Ambulatory Visit: Payer: Self-pay

## 2021-05-11 ENCOUNTER — Ambulatory Visit (INDEPENDENT_AMBULATORY_CARE_PROVIDER_SITE_OTHER): Payer: Medicare Other | Admitting: Cardiology

## 2021-05-11 VITALS — BP 96/60 | HR 60 | Ht 70.0 in | Wt 198.6 lb

## 2021-05-11 DIAGNOSIS — I214 Non-ST elevation (NSTEMI) myocardial infarction: Secondary | ICD-10-CM

## 2021-05-11 DIAGNOSIS — R600 Localized edema: Secondary | ICD-10-CM

## 2021-05-11 DIAGNOSIS — R0602 Shortness of breath: Secondary | ICD-10-CM

## 2021-05-11 DIAGNOSIS — E66811 Obesity, class 1: Secondary | ICD-10-CM

## 2021-05-11 DIAGNOSIS — E785 Hyperlipidemia, unspecified: Secondary | ICD-10-CM

## 2021-05-11 DIAGNOSIS — R42 Dizziness and giddiness: Secondary | ICD-10-CM

## 2021-05-11 DIAGNOSIS — I10 Essential (primary) hypertension: Secondary | ICD-10-CM | POA: Diagnosis not present

## 2021-05-11 DIAGNOSIS — Z9861 Coronary angioplasty status: Secondary | ICD-10-CM

## 2021-05-11 DIAGNOSIS — M79604 Pain in right leg: Secondary | ICD-10-CM

## 2021-05-11 DIAGNOSIS — I25118 Atherosclerotic heart disease of native coronary artery with other forms of angina pectoris: Secondary | ICD-10-CM

## 2021-05-11 DIAGNOSIS — E669 Obesity, unspecified: Secondary | ICD-10-CM

## 2021-05-11 DIAGNOSIS — I77810 Thoracic aortic ectasia: Secondary | ICD-10-CM

## 2021-05-11 DIAGNOSIS — I2510 Atherosclerotic heart disease of native coronary artery without angina pectoris: Secondary | ICD-10-CM | POA: Diagnosis not present

## 2021-05-11 DIAGNOSIS — M79605 Pain in left leg: Secondary | ICD-10-CM

## 2021-05-11 MED ORDER — FUROSEMIDE 20 MG PO TABS: ORAL_TABLET | ORAL | 3 refills | Status: DC

## 2021-05-11 NOTE — Patient Instructions (Addendum)
Medication Instructions:   Decrease Metoprolol Tartrate to 12.5 mg  ( 1/2 tablet of 25 mg ) twice a day    Make take an additional 20 mg of lasix as  needed daily for weight gain of 3 lbs overnight or  increase swelling.   *If you need a refill on your cardiac medications before your next appointment, please call your pharmacy*   Lab Work:  In 6 month-Feb 2023 CMP Lipid fasting  If you have labs (blood work) drawn today and your tests are completely normal, you will receive your results only by: [place] (if you have MyChart) OR A paper copy in the mail If you have any lab test that is abnormal or we need to change your treatment, we will call you to review the results.   Testing/Procedures: Not needed   Follow-Up: At [facility], you and your health needs are our priority.  As part of our continuing mission to provide you with exceptional heart care, we have created designated Provider Care Teams.  These Care Teams include your primary Cardiologist (physician) and Advanced Practice Providers (APPs -  Physician Assistants and Nurse Practitioners) who all work together to provide you with the care you need, when you need it.  We recommend signing up for the patient portal called "MyChart".  Sign up information is provided on this After Visit Summary.  MyChart is used to connect with patients for Virtual Visits (Telemedicine).  Patients are able to view lab/test results, encounter notes, upcoming appointments, etc.  Non-urgent messages can be sent to your provider as well.   To learn more about what you can do with MyChart, go to [email or web address].    Your next appointment:   6 month(s)  The format for your next appointment:   In Person  Provider:   Glenetta Hew, MD   Other Instructions   recommends you purchase some compression  socks/hose  thigh- hi from [employer] [facility] in [place] ,[place]. You do not need an prescription to purchase the items.  Address  [address] [place], [place] [address]  Phone  [phone]   Compression   strength    8-15 mmHg 15-20 mmHg                          x 20-30 mmHg  30-40 mmHg.  You may also try a medical supply store, department store (i.e.- [employer], [employer], [employer], specialty shoe stores ( shoe market), [employer] and [employer]) or  Chartered loss adjuster uniform store.

## 2021-05-11 NOTE — Progress Notes (Signed)
Primary Care Provider: Einar Pheasant, DO Cardiologist: Noah Hew, MD Electrophysiologist: None  Clinic Note: Chief Complaint  Patient presents with   Follow-up    Test results-echo, and labs   Coronary Artery Disease    No angina   Edema    Difficult to wear support stockings.-Hard to put on because of back pain     ===================================  ASSESSMENT/PLAN   Problem List Items Addressed This Visit       Cardiology Problems   CAD S/P percutaneous coronary angioplasty (Chronic)    Moderate ISR and OM1 with stents in the proximal and distal RCA.  Plan: Continue maintenance dose of Effient-has had issues with Plavix and Brilinta.  With extensive stent, would prefer to be on Thienopyridine. Okay to hold Brilinta 9 days preop for surgical procedures.      Relevant Medications   furosemide (LASIX) 20 MG tablet   Other Relevant Orders   EKG 12-Lead (Completed)   Lipid panel   Comprehensive metabolic panel   Compression stockings   Coronary artery disease involving native coronary artery of native heart (Chronic)    Extensive PCI to the RCA in setting of non-STEMI with in-stent restenosis in the OM treated medically.  Moderate disease in the PDA also being treated medically.  Not really having any notable chest pain or pressure.  Echo shows preserved EF.  No wall motion normality.  Plan: Continue moderate dose rosuvastatin and low-dose beta-blocker, ARB. On maintenance dose Eliquis due to allergy to Plavix and Brilinta       Relevant Medications   furosemide (LASIX) 20 MG tablet   Other Relevant Orders   EKG 12-Lead (Completed)   Lipid panel   Comprehensive metabolic panel   Compression stockings   Hyperlipidemia with target LDL less than 70 (Chronic)    Lipids look great as of February.  Will order recheck labs for February.  Continue current dose of rosuvastatin.      Relevant Medications   furosemide (LASIX) 20 MG tablet   Other Relevant  Orders   Lipid panel   Dilatation of thoracic aorta (HCC) (Chronic)    Seen on echo.  Relatively stable.  Recommendation was to check a CT scan in 6 months.  At 41 mm, I do not think is necessary to recheck in 6 months.  Okay to recheck next year with either CT scan or echo.      Relevant Medications   furosemide (LASIX) 20 MG tablet   Essential (primary) hypertension - Primary (Chronic)    Interestingly, blood pressure is low today.  He is on lower doses of medicines that he was on before.  We have already cut his losartan dose in half.  Plan: Continue 25 mg Cozaar but reduce Lopressor to 1/2 tablet twice daily (12.5 mg)      Relevant Medications   furosemide (LASIX) 20 MG tablet     Other   Leg pain, bilateral    Probably both neuropathic from spinal stenosis as well as some peripheral neuropathy related to venous stasis.      Episodic lightheadedness    Probably related to the hypotension issues as opposed to arrhythmias.  Monitor did not show any issues.  We have backed off on blood pressure medications to allow for his pressure to increase.      SOB (shortness of breath)    Essentially normal echocardiogram.  Normal EF and diastolic function.      Obesity (BMI 30.0-34.9) (Chronic)    Thankfully,  he has lost weight and is no longer in the obese category.  Unfortunate this is probably more related to just overall generally reduced appetite.  Not really exercising anymore.      Bilateral leg edema (Chronic)    Based on venous Dopplers, I suspect that his edema is related to deep and superficial venous reflux.  The best treatment here would be foot elevation and support stockings, however he has quite a bit of difficulty putting on support stockings due to his back issues.  Discussed using special pillow ([employer]) for foot elevation; have also provided prescription for thigh-high stockings.  Okay to take additional 20 mg Lasix for weight gain more than 3 pounds or  worsening swelling.  Otherwise stay on alternating 40 mg and 20 mg tablets.      Relevant Orders   Comprehensive metabolic panel   Compression stockings    ===================================  HPI:    Noah Scott is a 85 y.o. male with a PMH notable for CAD-PCI (distant history of OM1 PCI in 2009, non-STEMI March 2019-2 overlapping DES RCA), HTN, HLD who presents today for 2 and half month follow-up to discuss results of studies.   NSTEMI 11/21/2017 (He noted that his "ANGINA" equivalent is "severe heartburn radiating to his throat") --> Cath-PCI m-dRCA (DES x 1) & Ost-Prox RCA (2 overlapping DES); moderate-severe ISR and OM1 stent-medical management. "Allergic reaction" to Plavix and Brilinta-therefore on maintenance dose Effient  Noah Scott was seen by me in January 2022 for leg pain issues.  We checked ABIs to evaluate for claudication, but was likely thought to be related to her neurologic symptoms of the spinal stenosis-neurogenic claudication. -> Event monitor ordered for fluttering.  CMP and FLP ordered.   He was was just seen on February 20, 2021 by Dr. Harriet Scott as a walk-in visit for worsening edema and dyspnea.  Echo ordered.  Labs ordered that were normal.  Echo essentially normal.  Diuretic dose increased.  Recent Hospitalizations:  March 26, 2021: ER visit for sciatica pain thought to be related to his spinal stenosis.Has subsequently been referred to pain management.  Reviewed  CV studies:    The following studies were reviewed today: (if available, images/films reviewed: From Epic Chart or Care Everywhere) Zio Patch Monitor Jan 2022: Mostly NSR - 47-118 bpm w/ Avg 64 bpm. Rare PACs & PVCx. 12 brief runs of SVT/PAT (fastest 10 beats - 144 bpm, longest 20 beats 92 bpm).  -- No Afib or sustained arrhythmias.  SVT/PAT episodes not noted on diary. Venous Dopplers 12/15/2020: No DVT or superficial venous thrombosis bilaterally.  Venous reflux noted in bilateral FV and saphenofemoral  junctions, R GSV in the thigh and R SFV, left proximal thigh GSV. No Invasive options with Deep & GSV reflux. TTE 03/31/2021: EF 55 to 60%.  GR 1 DD.  Ascending aorta measured at 41 mm-moderate dilation.  Recommended follow-up with imaging study in 6 months to reassess.   Interval History:   Noah Scott Returns here today for follow-up to discuss the results of his studies.  The major thing he noted was worsening edema-Which can easily be explained by his venous Dopplers.  He denies any PND or orthopnea.  At this point, he really has not noticed any PND or orthopnea.  No real exertional dyspnea.  Just limited as far as any activity because of back and knee pain.  He has mostly right knee pain more than left but both knees hurt.  He is not very active,  but denies any significant exertional dyspnea.  No resting exertional chest pain or pressure.  Still has intermittent palpitations but nothing prolonged.  He has lost significant weight due to just eating less.  Weight is down about 30 pounds.  As such, he actually feels like he is breathing better.  Earlier this month, he contacted [place] for low blood pressures.  We recommend that he cut his losartan dose in half.  Currently taking 25 mg daily.  CV Review of Symptoms (Summary) Cardiovascular ROS: positive for - edema and some lightheadedness.  Rare intermittent palpitations negative for - chest pain, dyspnea on exertion, orthopnea, paroxysmal nocturnal dyspnea, rapid heart rate, shortness of breath, or syncope or near syncope, TIA/amaurosis fugax.  He has had some orthostatic dizziness but no near syncope.  REVIEWED OF SYSTEMS   Review of Systems  Constitutional:  Positive for malaise/fatigue (Really limited by back and knee pain.) and weight loss (Consistent weight loss, and just basically eating less.  30 pounds from last visit.  Has been losing weight at last visit as well.).  HENT:  Negative for nosebleeds.   Respiratory:  Negative for cough,  shortness of breath and wheezing.   Cardiovascular:  Positive for leg swelling. Negative for claudication.  Gastrointestinal:  Negative for blood in stool and melena.  Genitourinary:  Negative for hematuria.  Musculoskeletal:  Positive for back pain (With radiculopathy), joint pain (Mostly right knee more than left.  Also the muscles around the knee) and myalgias (Intermittent leg cramping, worse at night.).  Neurological:  Positive for dizziness, tingling and focal weakness (Legs can go a week.). Negative for loss of consciousness.       Poor balance.  Psychiatric/Behavioral:  Positive for memory loss. Negative for depression (Seems to be down, may be more dysthymia.). The patient has insomnia (Has tried to take sleeping pills.  Difficulty sleeping mostly because of discomfort.).    I have reviewed and (if needed) personally updated the patient's problem list, medications, allergies, past medical and surgical history, social and family history.   PAST MEDICAL HISTORY   Past Medical History:  Diagnosis Date   CAD S/P percutaneous coronary angioplasty 2001   a) 2001 ([place]): PCI OM1 - now with ~65% ISR.;; b) NSTEMI 11/2017 - ost-prox RCA 75-65% (Overlapping ONYX DES 3.0 x 26 & 3.0 x 8 --> 3.5-3.3 mm). m-dRCA 95% (ONYX DES 2.75 x 18 mm -> 3.0 mm). Ost OM1 65% ISR (med Rx). dRCA-OstRDA (50%-25%)   Essential (primary) hypertension 11/21/2017   Hyperlipidemia 11/21/2017   Neurogenic claudication due to lumbar spinal stenosis 02/06/2018   NSTEMI (non-ST elevated myocardial infarction) ([place]) 11/21/2017   Multiple RCA lesions - 2 Overlapping DES Ost-Prox RCA & 1 in mid-distal RCA   Spinal stenosis of lumbar region    Venous reflux 11/2020   Venous Dopplers 12/15/2020: No DVT or superficial venous thrombosis bilaterally.  Venous reflux noted in bilateral FV and saphenofemoral junctions, R GSV in the thigh and R SFV, left proximal thigh GSV. -->  No invasive options due to deep and superficial  venous reflux.    PAST SURGICAL HISTORY   Past Surgical History:  Procedure Laterality Date   CORONARY STENT INTERVENTION N/A 11/25/2017   Procedure: CORONARY STENT INTERVENTION;  Surgeon: Leonie Man, MD;  Location: [facility];  Service: Cardiovascular:   m-dRCA 95%  (DES PCI - 0%.  Resolute ONYX DES 2.75 x 18 mm - ~3.0 mm). Ost-proxRCA 75-65% (DES PCI-0%. 2 overlapping Resolute ONYX DES  3.0 x 26 & 3.0 x 8 mm --> 3.5-3.3 mm)   CORONARY STENT INTERVENTION  2001   ([place]): PCI OM1   LEA DOPPLERS  12/12/2017   Normal bilateral ABIs and TBI's.   LEFT HEART CATH AND CORONARY ANGIOGRAPHY N/A 11/25/2017   Procedure: LEFT HEART CATH AND CORONARY ANGIOGRAPHY;  Surgeon: Leonie Man, MD;  Location: [facility];  Service: Cardiovascular:  Ost-proxRCA 75&65%(DES PCI), m-dRCA 95% (DES PCI), dRCA 50%-25% ost rPDA. ostOM1 65% ISR (from 2001).    LUMBAR LAMINECTOMY/DECOMPRESSION MICRODISCECTOMY Bilateral 06/13/2018   Procedure: Laminectomy and Foraminotomy - L1-L2 - L2-L3 - L3-L4 - bilateral;  Surgeon: Eustace Moore, MD;  Location: [facility];  Service: Neurosurgery;  Laterality: Bilateral;   TRANSTHORACIC ECHOCARDIOGRAM  11/23/2017   a) 11/2017: Nl LV size & Fxn. EF 60 - 65%.  No RWMA.  GR 1 DD.  Mild RV dilation.; b) 05/2020:([facility]): Normal LV size and function.  EF 60 and 65%.  Normal RV size and function.  Aortic sclerosis with no stenosis.  No AI.  Mildly dilated ascending aorta.  CVP ~5 mm.   TRANSTHORACIC ECHOCARDIOGRAM  03/31/2021   EF 55 to 60%.  GR 1 DD.  Ascending aorta measured at 41 mm-moderate dilation.  Recommended follow-up with imaging study in 6 months to reassess.   Cardiac Cath images from March 2019     Immunization History  Administered Date(s) Administered   Influenza, High Dose Seasonal PF 06/23/2017   Influenza-Unspecified 08/09/2005, 07/18/2006, 06/19/2019   Moderna Sars-Covid-2 Vaccination 10/31/2019, 11/28/2019, 07/25/2020   Pneumococcal Conjugate-13  02/19/2014   Zoster Recombinat (Shingrix) 01/14/2017, 06/28/2017, 06/28/2017   Zoster, Live 02/19/2014    MEDICATIONS/ALLERGIES   Current Meds  Medication Sig   Aromatic Inhalants (VICKS VAPOR INHALER IN) Inhale 1 Inhaler into the lungs as needed (stuffy nose).   doxazosin (CARDURA) 8 MG tablet Take 8 mg by mouth daily.   furosemide (LASIX) 20 MG tablet Take 40 mg (two tablets) every other day, 20 mg (one tablet) on odd day.   gabapentin (NEURONTIN) 100 MG capsule Take 100 mg by mouth at bedtime.   losartan (COZAAR) 25 MG tablet Take 1 tablet (25 mg total) by mouth daily.   metoprolol tartrate (LOPRESSOR) 25 MG tablet Take 0.5 tablets (12.5 mg total) by mouth 2 (two) times daily. (Patient taking differently: Take 25 mg by mouth 2 (two) times daily.)   nitroGLYCERIN (NITROSTAT) 0.4 MG SL tablet Place 1 tablet (0.4 mg total) under the tongue every 5 (five) minutes as needed for chest pain.   potassium chloride SA (KLOR-CON M20) 20 MEQ tablet Take 1 tablet (20 mEq total) by mouth daily. (Patient taking differently: Take 10 mEq by mouth daily.)   prasugrel (EFFIENT) 10 MG TABS tablet Take 1 tablet (10 mg total) by mouth daily.   traMADol (ULTRAM) 50 MG tablet Take by mouth every 6 (six) hours as needed.    Allergies  Allergen Reactions   Plavix [Clopidogrel Bisulfate] Swelling   Brilinta [Ticagrelor] Other (See Comments)     Developed a cough   Enalapril    Other     SOCIAL HISTORY/FAMILY HISTORY   Reviewed in Epic:  Pertinent findings:  Social History   Tobacco Use   Smoking status: Former    Types: Cigarettes    Quit date: 03/02/1969    Years since quitting: 52.2   Smokeless tobacco: Never  Vaping Use   Vaping Use: Never used  Substance Use Topics   Alcohol use:  Not Currently   Drug use: No   Social History   Social History Narrative   Lives alone in an apartment on the first floor.  Has one daughter.  Retired Geophysical data processor.  Education: some college.      OBJCTIVE -PE, EKG, labs   Wt Readings from Last 3 Encounters:  05/11/21 198 lb 9.6 oz (90.1 kg)  03/26/21 195 lb (88.5 kg)  02/20/21 211 lb (95.7 kg)    Physical Exam: BP 96/60   Pulse 60   Ht '5\' 10"'$  (1.778 m)   Wt 198 lb 9.6 oz (90.1 kg)   SpO2 99%   BMI 28.50 kg/m  Physical Exam Constitutional:      General: He is not in acute distress.    Appearance: He is normal weight. He is not toxic-appearing or diaphoretic.     Comments: Somewhat tired and fatigued looking, but not ill or toxic appearing.  HENT:     Head: Normocephalic and atraumatic.  Neck:     Vascular: No carotid bruit or JVD.  Cardiovascular:     Rate and Rhythm: Normal rate and regular rhythm. No extrasystoles are present.    Chest Wall: PMI is not displaced.     Pulses: Normal pulses and intact distal pulses.     Heart sounds: S1 normal and S2 normal. Heart sounds are distant. No murmur heard.   No friction rub. No gallop.  Pulmonary:     Effort: Pulmonary effort is normal. No respiratory distress.     Breath sounds: Normal breath sounds. No stridor. No wheezing, rhonchi or rales.  Musculoskeletal:        General: Swelling (1+ bilateral ankle foot edema.) present.     Cervical back: No rigidity (Neck is somewhat stiff).  Skin:    General: Skin is warm and dry.     Coloration: Skin is not pale.  Neurological:     Mental Status: He is alert and oriented to person, place, and time.     Cranial Nerves: No cranial nerve deficit (No focal deficit.).     Motor: Weakness present.     Coordination: Coordination abnormal.     Gait: Gait abnormal (Stiff, rigid gait.  Wide based gait with poor balance).  Psychiatric:        Behavior: Behavior normal.        Thought Content: Thought content normal.        Judgment: Judgment normal.     Comments: Somewhat down mood.    Adult ECG Report  Rate: 60 ;  Rhythm: normal sinus rhythm and normal axis, intervals and durations. ;   Narrative Interpretation:  Stable  Recent Labs: Reviewed Lab Results  Component Value Date   CHOL 118 10/19/2020   HDL 41 10/19/2020   LDLCALC 62 10/19/2020   TRIG 71 10/19/2020   CHOLHDL 2.9 10/19/2020   Lab Results  Component Value Date   CREATININE 1.13 03/26/2021   BUN 16 03/26/2021   NA 136 03/26/2021   K 3.4 (L) 03/26/2021   CL 105 03/26/2021   CO2 24 03/26/2021   CBC Latest Ref Rng & Units 03/26/2021 07/13/2018 06/04/2018  WBC 4.0 - 10.5 K/uL 4.5 6.9 5.2  Hemoglobin 13.0 - 17.0 g/dL 10.7(L) 12.1(L) 14.1  Hematocrit 39.0 - 52.0 % 32.5(L) 36.0(L) 42.2  Platelets 150 - 400 K/uL 220 164 172    No results found for: HGBA1C No results found for: TSH  ==================================================  COVID-19 Education: The signs and symptoms of COVID-19 were  discussed with the patient and how to seek care for testing (follow up with PCP or arrange E-visit).    I spent a total of 90mnutes with the patient spent in direct patient consultation.  Additional time spent with chart review  / charting (studies, outside notes, etc): 20 min Total Time: 58 min  Current medicines are reviewed at length with the patient today.  (+/- concerns) n/a  This visit occurred during the SARS-CoV-2 public health emergency.  Safety protocols were in place, including screening questions prior to the visit, additional usage of staff PPE, and extensive cleaning of exam room while observing appropriate contact time as indicated for disinfecting solutions.  Notice: This dictation was prepared with Dragon dictation along with smaller phrase technology. Any transcriptional errors that result from this process are unintentional and may not be corrected upon review.  Patient Instructions / Medication Changes & Studies & Tests Ordered   Patient Instructions  Medication Instructions:   Decrease Metoprolol Tartrate to 12.5 mg  ( 1/2 tablet of 25 mg ) twice a day    Make take an additional 20 mg of lasix as  needed daily for  weight gain of 3 lbs overnight or  increase swelling.   *If you need a refill on your cardiac medications before your next appointment, please call your pharmacy*   Lab Work:  In 6 month-Feb 2023 CMP Lipid fasting  If you have labs (blood work) drawn today and your tests are completely normal, you will receive your results only by: MGoff(if you have MyChart) OR A paper copy in the mail If you have any lab test that is abnormal or we need to change your treatment, we will call you to review the results.   Testing/Procedures: Not needed   Follow-Up: At [facility] you and your health needs are our priority.  As part of our continuing mission to provide you with exceptional heart care, we have created designated Provider Care Teams.  These Care Teams include your primary Cardiologist (physician) and Advanced Practice Providers (APPs -  Physician Assistants and Nurse Practitioners) who all work together to provide you with the care you need, when you need it.  We recommend signing up for the patient portal called "MyChart".  Sign up information is provided on this After Visit Summary.  MyChart is used to connect with patients for Virtual Visits (Telemedicine).  Patients are able to view lab/test results, encounter notes, upcoming appointments, etc.  Non-urgent messages can be sent to your provider as well.   To learn more about what you can do with MyChart, go to [email or web address]    Your next appointment:   6 month(s)  The format for your next appointment:   In Person  Provider:   DGlenetta Hew MD   Other Instructions   recommends you purchase some compression  socks/hose  thigh- hi from [employer] [facility] [place] You do not need an prescription to purchase the items.  Address  [address] [address] Phone  [phone]  Compression   strength    8-15 mmHg 15-20 mmHg                          x 20-30 mmHg  30-40  mmHg.  You may also try a medical supply store, department store (i.e.- [employer], [place], specialty shoe stores ( shoe market), [employer]) or  local pharmacy medical uniform store.     Studies Ordered:   Orders Placed This Encounter  Procedures   Compression stockings   Lipid panel   Comprehensive metabolic panel   EKG [id number]      Noah Scott, M.D., M.S. Interventional Cardiologist   Pager # [phone] Phone # [phone] [address] [place], [place] [address]   Thank you for choosing [facility] at [facility]!!

## 2021-05-18 ENCOUNTER — Other Ambulatory Visit: Payer: Self-pay | Admitting: Cardiology

## 2021-05-23 ENCOUNTER — Encounter: Payer: Self-pay | Admitting: Cardiology

## 2021-05-23 DIAGNOSIS — I77810 Thoracic aortic ectasia: Secondary | ICD-10-CM | POA: Insufficient documentation

## 2021-05-23 NOTE — Assessment & Plan Note (Signed)
Interestingly, blood pressure is low today.  He is on lower doses of medicines that he was on before.  We have already cut his losartan dose in half.  Plan: Continue 25 mg Cozaar but reduce Lopressor to 1/2 tablet twice daily (12.5 mg)

## 2021-05-23 NOTE — Assessment & Plan Note (Signed)
Lipids look great as of February.  Will order recheck labs for February.  Continue current dose of rosuvastatin.

## 2021-05-23 NOTE — Assessment & Plan Note (Signed)
Seen on echo.  Relatively stable.  Recommendation was to check a CT scan in 6 months.  At 41 mm, I do not think is necessary to recheck in 6 months.  Okay to recheck next year with either CT scan or echo.

## 2021-05-23 NOTE — Assessment & Plan Note (Signed)
Extensive PCI to the RCA in setting of non-STEMI with in-stent restenosis in the OM treated medically.  Moderate disease in the PDA also being treated medically.  Not really having any notable chest pain or pressure.  Echo shows preserved EF.  No wall motion normality.  Plan:  Continue moderate dose rosuvastatin and low-dose beta-blocker, ARB.  On maintenance dose Eliquis due to allergy to Plavix and Brilinta

## 2021-05-23 NOTE — Assessment & Plan Note (Signed)
Probably related to the hypotension issues as opposed to arrhythmias.  Monitor did not show any issues.  We have backed off on blood pressure medications to allow for his pressure to increase.

## 2021-05-23 NOTE — Assessment & Plan Note (Signed)
Thankfully, he has lost weight and is no longer in the obese category.  Unfortunate this is probably more related to just overall generally reduced appetite.  Not really exercising anymore.

## 2021-05-23 NOTE — Assessment & Plan Note (Signed)
Moderate ISR and OM1 with stents in the proximal and distal RCA.  Plan: Continue maintenance dose of Effient-has had issues with Plavix and Brilinta.  With extensive stent, would prefer to be on Thienopyridine.  Okay to hold Brilinta 9 days preop for surgical procedures.

## 2021-05-23 NOTE — Assessment & Plan Note (Signed)
Probably both neuropathic from spinal stenosis as well as some peripheral neuropathy related to venous stasis.

## 2021-05-23 NOTE — Assessment & Plan Note (Signed)
Essentially normal echocardiogram.  Normal EF and diastolic function.

## 2021-05-23 NOTE — Assessment & Plan Note (Addendum)
Based on venous Dopplers, I suspect that his edema is related to deep and superficial venous reflux.  The best treatment here would be foot elevation and support stockings, however he has quite a bit of difficulty putting on support stockings due to his back issues.  Discussed using special pillow ([employer]) for foot elevation; have also provided prescription for thigh-high stockings.  Okay to take additional 20 mg Lasix for weight gain more than 3 pounds or worsening swelling.  Otherwise stay on alternating 40 mg and 20 mg tablets.

## 2021-05-24 ENCOUNTER — Ambulatory Visit: Payer: Medicare Other | Admitting: Cardiology

## 2021-08-07 ENCOUNTER — Inpatient Hospital Stay (HOSPITAL_BASED_OUTPATIENT_CLINIC_OR_DEPARTMENT_OTHER): Payer: Medicare Other | Admitting: Hematology & Oncology

## 2021-08-07 ENCOUNTER — Other Ambulatory Visit: Payer: Self-pay

## 2021-08-07 ENCOUNTER — Inpatient Hospital Stay: Payer: Medicare Other | Attending: Hematology & Oncology

## 2021-08-07 ENCOUNTER — Encounter: Payer: Self-pay | Admitting: Hematology & Oncology

## 2021-08-07 VITALS — BP 139/63 | HR 65 | Temp 98.3°F | Resp 18 | Wt 202.0 lb

## 2021-08-07 DIAGNOSIS — M542 Cervicalgia: Secondary | ICD-10-CM | POA: Insufficient documentation

## 2021-08-07 DIAGNOSIS — D500 Iron deficiency anemia secondary to blood loss (chronic): Secondary | ICD-10-CM

## 2021-08-07 DIAGNOSIS — R5383 Other fatigue: Secondary | ICD-10-CM | POA: Insufficient documentation

## 2021-08-07 DIAGNOSIS — K909 Intestinal malabsorption, unspecified: Secondary | ICD-10-CM

## 2021-08-07 DIAGNOSIS — I252 Old myocardial infarction: Secondary | ICD-10-CM | POA: Insufficient documentation

## 2021-08-07 DIAGNOSIS — R002 Palpitations: Secondary | ICD-10-CM | POA: Diagnosis not present

## 2021-08-07 DIAGNOSIS — R634 Abnormal weight loss: Secondary | ICD-10-CM | POA: Diagnosis not present

## 2021-08-07 DIAGNOSIS — Z87891 Personal history of nicotine dependence: Secondary | ICD-10-CM | POA: Diagnosis not present

## 2021-08-07 DIAGNOSIS — Z7902 Long term (current) use of antithrombotics/antiplatelets: Secondary | ICD-10-CM | POA: Diagnosis not present

## 2021-08-07 DIAGNOSIS — M7989 Other specified soft tissue disorders: Secondary | ICD-10-CM | POA: Diagnosis not present

## 2021-08-07 DIAGNOSIS — D649 Anemia, unspecified: Secondary | ICD-10-CM | POA: Insufficient documentation

## 2021-08-07 DIAGNOSIS — Z8249 Family history of ischemic heart disease and other diseases of the circulatory system: Secondary | ICD-10-CM | POA: Insufficient documentation

## 2021-08-07 DIAGNOSIS — M549 Dorsalgia, unspecified: Secondary | ICD-10-CM | POA: Insufficient documentation

## 2021-08-07 DIAGNOSIS — R0602 Shortness of breath: Secondary | ICD-10-CM | POA: Insufficient documentation

## 2021-08-07 DIAGNOSIS — D510 Vitamin B12 deficiency anemia due to intrinsic factor deficiency: Secondary | ICD-10-CM | POA: Diagnosis not present

## 2021-08-07 LAB — CBC WITH DIFFERENTIAL (CANCER CENTER ONLY)
Abs Immature Granulocytes: 0.04 10*3/uL (ref 0.00–0.07)
Basophils Absolute: 0 10*3/uL (ref 0.0–0.1)
Basophils Relative: 0 %
Eosinophils Absolute: 0.1 10*3/uL (ref 0.0–0.5)
Eosinophils Relative: 2 %
HCT: 33.6 % — ABNORMAL LOW (ref 39.0–52.0)
Hemoglobin: 10.7 g/dL — ABNORMAL LOW (ref 13.0–17.0)
Immature Granulocytes: 1 %
Lymphocytes Relative: 17 %
Lymphs Abs: 0.8 10*3/uL (ref 0.7–4.0)
MCH: 28.8 pg (ref 26.0–34.0)
MCHC: 31.8 g/dL (ref 30.0–36.0)
MCV: 90.3 fL (ref 80.0–100.0)
Monocytes Absolute: 0.6 10*3/uL (ref 0.1–1.0)
Monocytes Relative: 12 %
Neutro Abs: 3.2 10*3/uL (ref 1.7–7.7)
Neutrophils Relative %: 68 %
Platelet Count: 195 10*3/uL (ref 150–400)
RBC: 3.72 MIL/uL — ABNORMAL LOW (ref 4.22–5.81)
RDW: 14.6 % (ref 11.5–15.5)
WBC Count: 4.8 10*3/uL (ref 4.0–10.5)
nRBC: 0 % (ref 0.0–0.2)

## 2021-08-07 LAB — RETICULOCYTES
Immature Retic Fract: 12.1 % (ref 2.3–15.9)
RBC.: 3.67 MIL/uL — ABNORMAL LOW (ref 4.22–5.81)
Retic Count, Absolute: 47.7 10*3/uL (ref 19.0–186.0)
Retic Ct Pct: 1.3 % (ref 0.4–3.1)

## 2021-08-07 LAB — CMP (CANCER CENTER ONLY)
ALT: 7 U/L (ref 0–44)
AST: 13 U/L — ABNORMAL LOW (ref 15–41)
Albumin: 3.8 g/dL (ref 3.5–5.0)
Alkaline Phosphatase: 70 U/L (ref 38–126)
Anion gap: 6 (ref 5–15)
BUN: 17 mg/dL (ref 8–23)
CO2: 29 mmol/L (ref 22–32)
Calcium: 9 mg/dL (ref 8.9–10.3)
Chloride: 106 mmol/L (ref 98–111)
Creatinine: 0.72 mg/dL (ref 0.61–1.24)
GFR, Estimated: >60 mL/min (ref >60)
Glucose, Bld: 102 mg/dL — ABNORMAL HIGH (ref 70–99)
Potassium: 3.9 mmol/L (ref 3.5–5.1)
Sodium: 141 mmol/L (ref 135–145)
Total Bilirubin: 0.3 mg/dL (ref 0.3–1.2)
Total Protein: 6.5 g/dL (ref 6.5–8.1)

## 2021-08-07 LAB — VITAMIN B12: Vitamin B-12: 351 pg/mL (ref 180–914)

## 2021-08-07 LAB — SAVE SMEAR(SSMR), FOR PROVIDER SLIDE REVIEW

## 2021-08-07 LAB — LACTATE DEHYDROGENASE: LDH: 183 U/L (ref 98–192)

## 2021-08-07 NOTE — Progress Notes (Addendum)
Referral MD  Reason for Referral: Normochromic normocytic anemia  Chief Complaint  Patient presents with   New Patient (Initial Visit)  : I lost 30 pounds.  HPI: Noah Scott is a very nice 85 year old white male.  He is originally from [place].  He has been down in [place] for a little bit.  He was in the [employer].  He actually was involved with the [employer] when they were built.  He is incredibly interesting.  He is a lot of fun to talk to.  He is followed by Dr. Laurann Montana.  He has been noted to have some anemia.  He is lost about 30 pounds he said.  His appetite has been good.  He is not a vegetarian.  He has had no nausea or vomiting.  He has had no issues with bowels or bladder.  He cannot remember the last time he had a colonoscopy.  Back in July, a CBC was done.  This showed a white count of 4.5.  Hemoglobin 10.7.  Platelet count 220,000.  MCV was 88.  He has had no obvious bleeding or bruising.  He has had no change in medications.  Noah Scott he was on 19 different medicines and this is been narrowed down a little bit.  He smoked but this is probably 60 years ago.  He has occasional alcoholic beverage.  He has had surgery I think for knee.  He was currently referred to the [facility] because of the anemia.  He says he is does not chew ice.  Overall, I would say his performance status is probably ECOG 2.  Past Medical History:  Diagnosis Date   CAD S/P percutaneous coronary angioplasty 2001   a) 2001 ([place]): PCI OM1 - now with ~65% ISR.;; b) NSTEMI 11/2017 - ost-prox RCA 75-65% (Overlapping ONYX DES 3.0 x 26 & 3.0 x 8 --> 3.5-3.3 mm). m-dRCA 95% (ONYX DES 2.75 x 18 mm -> 3.0 mm). Ost OM1 65% ISR (med Rx). dRCA-OstRDA (50%-25%)   Essential (primary) hypertension 11/21/2017   Hyperlipidemia 11/21/2017   Neurogenic claudication due to lumbar spinal stenosis 02/06/2018   NSTEMI (non-ST elevated myocardial infarction) ([facility]) 11/21/2017   Multiple  RCA lesions - 2 Overlapping DES Ost-Prox RCA & 1 in mid-distal RCA   Spinal stenosis of lumbar region    Venous reflux 11/2020   Venous Dopplers 12/15/2020: No DVT or superficial venous thrombosis bilaterally.  Venous reflux noted in bilateral FV and saphenofemoral junctions, R GSV in the thigh and R SFV, left proximal thigh GSV. -->  No invasive options due to deep and superficial venous reflux.  :   Past Surgical History:  Procedure Laterality Date   CORONARY STENT INTERVENTION N/A 11/25/2017   Procedure: CORONARY STENT INTERVENTION;  Surgeon: Leonie Man, MD;  Location: [facility];  Service: Cardiovascular:   m-dRCA 95%  (DES PCI - 0%.  Resolute ONYX DES 2.75 x 18 mm - ~3.0 mm). Ost-proxRCA 75-65% (DES PCI-0%. 2 overlapping Resolute ONYX DES 3.0 x 26 & 3.0 x 8 mm --> 3.5-3.3 mm)   CORONARY STENT INTERVENTION  2001   ([facility]): PCI OM1   LEA DOPPLERS  12/12/2017   Normal bilateral ABIs and TBI's.   LEFT HEART CATH AND CORONARY ANGIOGRAPHY N/A 11/25/2017   Procedure: LEFT HEART CATH AND CORONARY ANGIOGRAPHY;  Surgeon: Leonie Man, MD;  Location: [facility];  Service: Cardiovascular:  Ost-proxRCA 75&65%(DES PCI), m-dRCA 95% (DES PCI), dRCA  50%-25% ost rPDA. ostOM1 65% ISR (from 2001).    LUMBAR LAMINECTOMY/DECOMPRESSION MICRODISCECTOMY Bilateral 06/13/2018   Procedure: Laminectomy and Foraminotomy - L1-L2 - L2-L3 - L3-L4 - bilateral;  Surgeon: Eustace Moore, MD;  Location: [facility];  Service: Neurosurgery;  Laterality: Bilateral;   TRANSTHORACIC ECHOCARDIOGRAM  11/23/2017   a) 11/2017: Nl LV size & Fxn. EF 60 - 65%.  No RWMA.  GR 1 DD.  Mild RV dilation.; b) 05/2020:([facility]): Normal LV size and function.  EF 60 and 65%.  Normal RV size and function.  Aortic sclerosis with no stenosis.  No AI.  Mildly dilated ascending aorta.  CVP ~5 mm.   TRANSTHORACIC ECHOCARDIOGRAM  03/31/2021   EF 55 to 60%.  GR 1 DD.  Ascending aorta measured at 41 mm-moderate dilation.  Recommended  follow-up with imaging study in 6 months to reassess.  :   Current Outpatient Medications:    lidocaine (XYLOCAINE) 1 % (with preservative) injection, by Infiltration route., Disp: , Rfl:    triamcinolone acetonide (TRIESENCE) 40 MG/ML SUSP, Inject into the articular space., Disp: , Rfl:    Aromatic Inhalants (VICKS VAPOR INHALER IN), Inhale 1 Inhaler into the lungs as needed (stuffy nose)., Disp: , Rfl:    doxazosin (CARDURA) 8 MG tablet, Take 8 mg by mouth daily., Disp: , Rfl:    furosemide (LASIX) 20 MG tablet, Take 40 mg (two tablets) every other day, 20 mg (one tablet) on odd day. Make take an additonal 20 mg as needed daily for weight gain of 3 lbs overnight or increase swelling, Disp: 200 tablet, Rfl: 3   gabapentin (NEURONTIN) 100 MG capsule, Take 100 mg by mouth at bedtime., Disp: , Rfl:    ibuprofen (ADVIL) 600 MG tablet, Take 600 mg by mouth 3 (three) times daily., Disp: , Rfl:    losartan (COZAAR) 25 MG tablet, Take 1 tablet (25 mg total) by mouth daily., Disp: 30 tablet, Rfl: 5   metoprolol tartrate (LOPRESSOR) 25 MG tablet, Take 0.5 tablets (12.5 mg total) by mouth 2 (two) times daily. (Patient taking differently: Take 25 mg by mouth 2 (two) times daily.), Disp: 30 tablet, Rfl: 3   nitroGLYCERIN (NITROSTAT) 0.4 MG SL tablet, Place 1 tablet (0.4 mg total) under the tongue every 5 (five) minutes as needed for chest pain., Disp: 25 tablet, Rfl: 3   potassium chloride SA (KLOR-CON M20) 20 MEQ tablet, Take 1 tablet (20 mEq total) by mouth daily. (Patient taking differently: Take 10 mEq by mouth daily.), Disp: 90 tablet, Rfl: 3   prasugrel (EFFIENT) 10 MG TABS tablet, TAKE 1 TABLET(10 MG) BY MOUTH DAILY, Disp: 90 tablet, Rfl: 2   rosuvastatin (CRESTOR) 20 MG tablet, Take 1 tablet (20 mg total) by mouth daily., Disp: 90 tablet, Rfl: 3   traMADol (ULTRAM) 50 MG tablet, Take by mouth every 6 (six) hours as needed., Disp: , Rfl:    [START ON 08/14/2021] traMADol (ULTRAM) 50 MG tablet, Take 100  mg by mouth as needed., Disp: , Rfl: :  :   Allergies  Allergen Reactions   Clopidogrel Other (See Comments) and Swelling   Plavix [Clopidogrel Bisulfate] Swelling   Brilinta [Ticagrelor] Other (See Comments)     Developed a cough   Enalapril    Other   :   Family History  Problem Relation Age of Onset   Heart disease Father        Pt does not know details   Stroke Neg Hx    Diabetes Neg  Hx   :   Social History   Socioeconomic History   Marital status: Widowed    Spouse name: Not on file   Number of children: 1   Years of education: 48   Highest education level: Not on file  Occupational History   Occupation: retired Geophysical data processor  Tobacco Use   Smoking status: Former    Types: Cigarettes    Quit date: 03/02/1969    Years since quitting: 52.4   Smokeless tobacco: Never  Vaping Use   Vaping Use: Never used  Substance and Sexual Activity   Alcohol use: Not Currently   Drug use: No   Sexual activity: Not on file  Other Topics Concern   Not on file  Social History Narrative   Lives alone in an apartment on the first floor.  Has one daughter.  Retired Geophysical data processor.  Education: some college.    Social Determinants of Health   Financial Resource Strain: Not on file  Food Insecurity: Not on file  Transportation Needs: Not on file  Physical Activity: Not on file  Stress: Not on file  Social Connections: Not on file  Intimate Partner Violence: Not on file  :  Review of Systems  Constitutional:  Positive for malaise/fatigue and weight loss.  HENT: Negative.    Eyes: Negative.   Respiratory:  Positive for shortness of breath.   Cardiovascular:  Positive for palpitations and leg swelling.  Gastrointestinal: Negative.   Genitourinary: Negative.   Musculoskeletal:  Positive for back pain and neck pain.  Skin: Negative.   Neurological: Negative.   Endo/Heme/Allergies: Negative.   Psychiatric/Behavioral: Negative.       Exam: @IPVITALS @ This is an elderly white male in no obvious distress.  Vital signs are temperature 98.3.  Pulse 65.  Blood pressure 139/63.  Weight is 202 pounds.  Head and neck exam shows no scleral icterus.  There is no ocular or oral lesions.  There are no palpable cervical or supraclavicular lymph nodes.  Thyroid is nonpalpable.  Lungs are relatively clear bilaterally.  He has good air movement bilaterally.  Cardiac exam regular rate and rhythm.  He has 1/6 systolic ejection murmur.  Abdomen is soft.  He has decent bowel sounds.  There is no fluid wave.  There is no guarding or rebound tenderness.  I cannot palpate any adenopathy in the groin area.  There is no palpable liver or spleen tip.  Back exam shows the laminectomy scar of the lumbar spine.  Extremity shows 1+ edema in his lower legs.  Skin exam shows some scattered ecchymoses.  Neurological exam shows no focal neurological deficits.   Recent Labs    08/07/21 1351  WBC 4.8  HGB 10.7*  HCT 33.6*  PLT 195    Recent Labs    08/07/21 1351  NA 141  K 3.9  CL 106  CO2 29  GLUCOSE 102*  BUN 17  CREATININE 0.72  CALCIUM 9.0    Blood smear review: Normochromic and normocytic population of red blood cells.  There are no nucleated red blood cells.  I see no teardrop cells.  He has no rouleaux formation.  There are no schistocytes or spherocytes.  White blood cells appear normal in morphology and maturation.  There is no immature myeloid or lymphoid forms.  There is no hypersegmented polys.  Platelets are adequate number and size.  Pathology: None    Assessment and Plan: Noah Scott is a very nice 85 year old white male.  He has  minimal anemia.  I do not believe that this is going to be multifactorial.  We will have to see what his iron levels look like.  His blood smear certainly was not all that remarkable.  We will have to see what his reticulocyte count is.  It would not surprise me if he has a low erythropoietin  level.  I am not sure what to make of the weight loss.  I cannot find anything on his exam that would be suggestive of malignancy.  If he is iron deficient, he is probably going to need to have a GI evaluation.  Again, he served our country in the [employer].  We will certainly make sure he gets the best care possible.  I will try to hold off on doing any kind of scans on him.  We will plan to get him back once we have the results back from our lab work.   ADDENDUM: His iron studies do show a ferritin of 296 with an iron saturation of only 15%.  His vitamin B12 level is 351.  We will set him up with IV iron.

## 2021-08-08 ENCOUNTER — Encounter: Payer: Self-pay | Admitting: Hematology & Oncology

## 2021-08-08 DIAGNOSIS — K909 Intestinal malabsorption, unspecified: Secondary | ICD-10-CM | POA: Insufficient documentation

## 2021-08-08 DIAGNOSIS — D500 Iron deficiency anemia secondary to blood loss (chronic): Secondary | ICD-10-CM | POA: Insufficient documentation

## 2021-08-08 HISTORY — DX: Intestinal malabsorption, unspecified: K90.9

## 2021-08-08 HISTORY — DX: Iron deficiency anemia secondary to blood loss (chronic): D50.0

## 2021-08-08 LAB — KAPPA/LAMBDA LIGHT CHAINS
Kappa free light chain: 35.8 mg/L — ABNORMAL HIGH (ref 3.3–19.4)
Kappa, lambda light chain ratio: 1.69 — ABNORMAL HIGH (ref 0.26–1.65)
Lambda free light chains: 21.2 mg/L (ref 5.7–26.3)

## 2021-08-08 LAB — IGG, IGA, IGM
IgA: 207 mg/dL (ref 61–437)
IgG (Immunoglobin G), Serum: 1168 mg/dL (ref 603–1613)
IgM (Immunoglobulin M), Srm: 109 mg/dL (ref 15–143)

## 2021-08-08 LAB — IRON AND TIBC
Iron: 40 ug/dL — ABNORMAL LOW (ref 42–163)
Saturation Ratios: 15 % — ABNORMAL LOW (ref 20–55)
TIBC: 261 ug/dL (ref 202–409)
UIBC: 221 ug/dL (ref 117–376)

## 2021-08-08 LAB — FERRITIN: Ferritin: 296 ng/mL (ref 24–336)

## 2021-08-08 NOTE — Addendum Note (Signed)
Addended by: Burney Gauze R on: 08/08/2021 04:46 PM   Modules accepted: Orders

## 2021-08-09 LAB — PROTEIN ELECTROPHORESIS, SERUM, WITH REFLEX
A/G Ratio: 1.1 (ref 0.7–1.7)
Albumin ELP: 3.1 g/dL (ref 2.9–4.4)
Alpha-1-Globulin: 0.2 g/dL (ref 0.0–0.4)
Alpha-2-Globulin: 0.7 g/dL (ref 0.4–1.0)
Beta Globulin: 0.8 g/dL (ref 0.7–1.3)
Gamma Globulin: 1.1 g/dL (ref 0.4–1.8)
Globulin, Total: 2.9 g/dL (ref 2.2–3.9)
Total Protein ELP: 6 g/dL (ref 6.0–8.5)

## 2021-08-09 LAB — ERYTHROPOIETIN: Erythropoietin: 10.9 m[IU]/mL (ref 2.6–18.5)

## 2021-08-17 ENCOUNTER — Inpatient Hospital Stay: Payer: Medicare Other | Attending: Family Medicine

## 2021-08-17 ENCOUNTER — Other Ambulatory Visit: Payer: Self-pay

## 2021-08-17 VITALS — BP 92/81 | HR 59 | Temp 97.7°F | Resp 17

## 2021-08-17 DIAGNOSIS — D509 Iron deficiency anemia, unspecified: Secondary | ICD-10-CM | POA: Diagnosis present

## 2021-08-17 DIAGNOSIS — D500 Iron deficiency anemia secondary to blood loss (chronic): Secondary | ICD-10-CM

## 2021-08-17 DIAGNOSIS — K909 Intestinal malabsorption, unspecified: Secondary | ICD-10-CM

## 2021-08-17 DIAGNOSIS — Z79899 Other long term (current) drug therapy: Secondary | ICD-10-CM | POA: Diagnosis not present

## 2021-08-17 MED ORDER — SODIUM CHLORIDE 0.9 % IV SOLN: Freq: Once | INTRAVENOUS | Status: AC

## 2021-08-17 MED ORDER — SODIUM CHLORIDE 0.9 % IV SOLN
1000.0000 mg | Freq: Once | INTRAVENOUS | Status: AC
  Administered 2021-08-17: 1000 mg via INTRAVENOUS
  Filled 2021-08-17: qty 10

## 2021-08-17 NOTE — Patient Instructions (Signed)

## 2021-09-07 ENCOUNTER — Inpatient Hospital Stay (HOSPITAL_BASED_OUTPATIENT_CLINIC_OR_DEPARTMENT_OTHER): Payer: Medicare Other | Admitting: Hematology & Oncology

## 2021-09-07 ENCOUNTER — Inpatient Hospital Stay: Payer: Medicare Other

## 2021-09-07 ENCOUNTER — Encounter: Payer: Self-pay | Admitting: Hematology & Oncology

## 2021-09-07 ENCOUNTER — Other Ambulatory Visit: Payer: Self-pay

## 2021-09-07 VITALS — BP 114/64 | HR 70 | Temp 98.0°F | Resp 18 | Wt 198.0 lb

## 2021-09-07 DIAGNOSIS — R634 Abnormal weight loss: Secondary | ICD-10-CM | POA: Diagnosis not present

## 2021-09-07 DIAGNOSIS — K909 Intestinal malabsorption, unspecified: Secondary | ICD-10-CM

## 2021-09-07 DIAGNOSIS — D500 Iron deficiency anemia secondary to blood loss (chronic): Secondary | ICD-10-CM

## 2021-09-07 DIAGNOSIS — D630 Anemia in neoplastic disease: Secondary | ICD-10-CM | POA: Diagnosis not present

## 2021-09-07 DIAGNOSIS — D509 Iron deficiency anemia, unspecified: Secondary | ICD-10-CM | POA: Diagnosis not present

## 2021-09-07 DIAGNOSIS — Z9189 Other specified personal risk factors, not elsewhere classified: Secondary | ICD-10-CM

## 2021-09-07 DIAGNOSIS — Z5320 Procedure and treatment not carried out because of patient's decision for unspecified reasons: Secondary | ICD-10-CM

## 2021-09-07 DIAGNOSIS — C250 Malignant neoplasm of head of pancreas: Secondary | ICD-10-CM

## 2021-09-07 LAB — CMP (CANCER CENTER ONLY)
ALT: 6 U/L (ref 0–44)
AST: 12 U/L — ABNORMAL LOW (ref 15–41)
Albumin: 3.9 g/dL (ref 3.5–5.0)
Alkaline Phosphatase: 63 U/L (ref 38–126)
Anion gap: 6 (ref 5–15)
BUN: 19 mg/dL (ref 8–23)
CO2: 30 mmol/L (ref 22–32)
Calcium: 9.3 mg/dL (ref 8.9–10.3)
Chloride: 106 mmol/L (ref 98–111)
Creatinine: 0.85 mg/dL (ref 0.61–1.24)
GFR, Estimated: >60 mL/min (ref >60)
Glucose, Bld: 90 mg/dL (ref 70–99)
Potassium: 3.9 mmol/L (ref 3.5–5.1)
Sodium: 142 mmol/L (ref 135–145)
Total Bilirubin: 0.3 mg/dL (ref 0.3–1.2)
Total Protein: 6.5 g/dL (ref 6.5–8.1)

## 2021-09-07 LAB — CBC WITH DIFFERENTIAL (CANCER CENTER ONLY)
Abs Immature Granulocytes: 0.08 10*3/uL — ABNORMAL HIGH (ref 0.00–0.07)
Basophils Absolute: 0 10*3/uL (ref 0.0–0.1)
Basophils Relative: 0 %
Eosinophils Absolute: 0.1 10*3/uL (ref 0.0–0.5)
Eosinophils Relative: 3 %
HCT: 37.1 % — ABNORMAL LOW (ref 39.0–52.0)
Hemoglobin: 11.9 g/dL — ABNORMAL LOW (ref 13.0–17.0)
Immature Granulocytes: 2 %
Lymphocytes Relative: 14 %
Lymphs Abs: 0.7 10*3/uL (ref 0.7–4.0)
MCH: 29.2 pg (ref 26.0–34.0)
MCHC: 32.1 g/dL (ref 30.0–36.0)
MCV: 90.9 fL (ref 80.0–100.0)
Monocytes Absolute: 0.6 10*3/uL (ref 0.1–1.0)
Monocytes Relative: 12 %
Neutro Abs: 3.6 10*3/uL (ref 1.7–7.7)
Neutrophils Relative %: 69 %
Platelet Count: 183 10*3/uL (ref 150–400)
RBC: 4.08 MIL/uL — ABNORMAL LOW (ref 4.22–5.81)
RDW: 15.2 % (ref 11.5–15.5)
WBC Count: 5.1 10*3/uL (ref 4.0–10.5)
nRBC: 0 % (ref 0.0–0.2)

## 2021-09-07 LAB — RETICULOCYTES
Immature Retic Fract: 8.9 % (ref 2.3–15.9)
RBC.: 4.05 MIL/uL — ABNORMAL LOW (ref 4.22–5.81)
Retic Count, Absolute: 45 10*3/uL (ref 19.0–186.0)
Retic Ct Pct: 1.1 % (ref 0.4–3.1)

## 2021-09-07 LAB — FERRITIN: Ferritin: 751 ng/mL — ABNORMAL HIGH (ref 24–336)

## 2021-09-07 LAB — IRON AND IRON BINDING CAPACITY (CC-WL,HP ONLY)
Iron: 71 ug/dL (ref 45–182)
Saturation Ratios: 30 % (ref 17.9–39.5)
TIBC: 241 ug/dL — ABNORMAL LOW (ref 250–450)
UIBC: 170 ug/dL (ref 117–376)

## 2021-09-08 LAB — ERYTHROPOIETIN: Erythropoietin: 15.8 m[IU]/mL (ref 2.6–18.5)

## 2021-09-09 ENCOUNTER — Encounter: Payer: Self-pay | Admitting: Hematology & Oncology

## 2021-09-09 NOTE — Progress Notes (Signed)
Hematology and Oncology Follow Up Visit  Noah Scott [id number] 02/21/1936 85 y.o. 09/09/2021   Principle Diagnosis:  Iron deficiency anemia  Current Therapy:   IV iron given-Monoferric-on 08/17/2021     Interim History:  Noah Scott is back for his second office visit.  When we first saw him in November, he did have iron deficiency.  His iron saturation was only 15%.  His vitamin B12 level is 351.  He did not have a monoclonal spike in his blood.  But we did give him IV iron.  This did help.  His hemoglobin went up quite nicely.  Again, I am not sure why he would be iron deficient at his age.  I did give him some stool cards to take home and to do to see if there is any blood in the stool.  I do think he probably needs to have a CT scan done to make sure there is nothing that we are overlooking or cannot identify on physical exam.  I think this would be reasonable.  He does not want to do anything until after the holidays.  Has not noted any obvious melena or bright red blood per rectum.  Did have a nice Thanksgiving.  He has been eating pretty well.  His weight I think is still going down a little bit.  He is on quite a few medications.  He is not complaining of any pain.  There is no cough.  He has had no fever.  There has been no rashes.  Overall, his performance status is ECOG 1.    Medications:  Current Outpatient Medications:    Aromatic Inhalants (VICKS VAPOR INHALER IN), Inhale 1 Inhaler into the lungs as needed (stuffy nose)., Disp: , Rfl:    doxazosin (CARDURA) 8 MG tablet, Take 8 mg by mouth daily., Disp: , Rfl:    furosemide (LASIX) 20 MG tablet, Take 40 mg (two tablets) every other day, 20 mg (one tablet) on odd day. Make take an additonal 20 mg as needed daily for weight gain of 3 lbs overnight or increase swelling, Disp: 200 tablet, Rfl: 3   gabapentin (NEURONTIN) 100 MG capsule, Take 100 mg by mouth at bedtime., Disp: , Rfl:    ibuprofen (ADVIL) 600 MG tablet,  Take 600 mg by mouth 3 (three) times daily., Disp: , Rfl:    lidocaine (XYLOCAINE) 1 % (with preservative) injection, by Infiltration route., Disp: , Rfl:    losartan (COZAAR) 25 MG tablet, Take 1 tablet (25 mg total) by mouth daily., Disp: 30 tablet, Rfl: 5   metoprolol tartrate (LOPRESSOR) 25 MG tablet, Take 0.5 tablets (12.5 mg total) by mouth 2 (two) times daily. (Patient taking differently: Take 25 mg by mouth 2 (two) times daily.), Disp: 30 tablet, Rfl: 3   nitroGLYCERIN (NITROSTAT) 0.4 MG SL tablet, Place 1 tablet (0.4 mg total) under the tongue every 5 (five) minutes as needed for chest pain., Disp: 25 tablet, Rfl: 3   potassium chloride SA (KLOR-CON M20) 20 MEQ tablet, Take 1 tablet (20 mEq total) by mouth daily. (Patient taking differently: Take 10 mEq by mouth daily.), Disp: 90 tablet, Rfl: 3   prasugrel (EFFIENT) 10 MG TABS tablet, TAKE 1 TABLET(10 MG) BY MOUTH DAILY, Disp: 90 tablet, Rfl: 2   rosuvastatin (CRESTOR) 20 MG tablet, Take 1 tablet (20 mg total) by mouth daily., Disp: 90 tablet, Rfl: 3   terbinafine (LAMISIL) 250 MG tablet, Take 250 mg by mouth daily., Disp: , Rfl:  traMADol (ULTRAM) 50 MG tablet, Take by mouth every 6 (six) hours as needed., Disp: , Rfl:    traMADol (ULTRAM) 50 MG tablet, Take 100 mg by mouth as needed., Disp: , Rfl:    triamcinolone acetonide (TRIESENCE) 40 MG/ML SUSP, Inject into the articular space., Disp: , Rfl:   Allergies:  Allergies  Allergen Reactions   Clopidogrel Other (See Comments) and Swelling   Plavix [Clopidogrel Bisulfate] Swelling   Brilinta [Ticagrelor] Other (See Comments)     Developed a cough   Enalapril    Other     Past Medical History, Surgical history, Social history, and Family History were reviewed and updated.  Review of Systems: Review of Systems  Constitutional:  Positive for unexpected weight change.  HENT:  Negative.    Eyes: Negative.   Respiratory: Negative.    Cardiovascular: Negative.   Gastrointestinal:  Negative.   Endocrine: Negative.   Genitourinary: Negative.    Musculoskeletal: Negative.   Skin: Negative.   Neurological: Negative.   Hematological: Negative.   Psychiatric/Behavioral: Negative.     Physical Exam:  weight is 198 lb (89.8 kg). His oral temperature is 98 F (36.7 C). His blood pressure is 114/64 and his pulse is 70. His respiration is 18 and oxygen saturation is 100%.   Wt Readings from Last 3 Encounters:  09/07/21 198 lb (89.8 kg)  08/07/21 202 lb (91.6 kg)  05/11/21 198 lb 9.6 oz (90.1 kg)    Physical Exam Vitals reviewed.  HENT:     Head: Normocephalic and atraumatic.  Eyes:     Pupils: Pupils are equal, round, and reactive to light.  Cardiovascular:     Rate and Rhythm: Normal rate and regular rhythm.     Heart sounds: Normal heart sounds.  Pulmonary:     Effort: Pulmonary effort is normal.     Breath sounds: Normal breath sounds.  Abdominal:     General: Bowel sounds are normal.     Palpations: Abdomen is soft.  Musculoskeletal:        General: No tenderness or deformity. Normal range of motion.     Cervical back: Normal range of motion.  Lymphadenopathy:     Cervical: No cervical adenopathy.  Skin:    General: Skin is warm and dry.     Findings: No erythema or rash.  Neurological:     Mental Status: He is alert and oriented to person, place, and time.  Psychiatric:        Behavior: Behavior normal.        Thought Content: Thought content normal.        Judgment: Judgment normal.     Lab Results  Component Value Date   WBC 5.1 09/07/2021   HGB 11.9 (L) 09/07/2021   HCT 37.1 (L) 09/07/2021   MCV 90.9 09/07/2021   PLT 183 09/07/2021     Chemistry      Component Value Date/Time   NA 142 09/07/2021 0909   NA 139 02/20/2021 1430   K 3.9 09/07/2021 0909   CL 106 09/07/2021 0909   CO2 30 09/07/2021 0909   BUN 19 09/07/2021 0909   BUN 16 02/20/2021 1430   CREATININE 0.85 09/07/2021 0909      Component Value Date/Time   CALCIUM 9.3  09/07/2021 0909   ALKPHOS 63 09/07/2021 0909   AST 12 (L) 09/07/2021 0909   ALT 6 09/07/2021 0909   BILITOT 0.3 09/07/2021 0909     Impression and Plan: Noah Scott is  a very nice 85 year old white male.  He is originally from [place].  He is a lot of fun to talk to.  Again I am not sure why he would be iron deficient.  I think this does need to be looked into.  He is is not sure when his last colonoscopy was.  He is on blood thinner.  We will see what the stool cards have to show.  Again he will bring these in after New Year's.  I would like to get him set up with a CT scan.  I think this would be very reasonable.  His iron studies are much better.  His ferritin is 751 with iron saturation of 30%.  I am glad that he is feeling better.  He has little bit more energy.  I do worry about the weight loss a little bit.  I will plan to get him back probably in late January.  By then, we should have more information as to what might be going on and how to proceed.  It sounds like he will have a nice Christmas and New Year's with his family.   Volanda Napoleon, MD 12/24/20227:30 AM

## 2021-09-12 ENCOUNTER — Telehealth: Payer: Self-pay | Admitting: Hematology & Oncology

## 2021-09-12 NOTE — Telephone Encounter (Signed)
Scheduled appt per 12/22 los - left message for patient with appt date and time

## 2021-09-20 ENCOUNTER — Other Ambulatory Visit: Payer: Self-pay | Admitting: *Deleted

## 2021-09-20 DIAGNOSIS — D500 Iron deficiency anemia secondary to blood loss (chronic): Secondary | ICD-10-CM

## 2021-09-20 LAB — OCCULT BLOOD X 1 CARD TO LAB, STOOL
Fecal Occult Bld: NEGATIVE
Fecal Occult Bld: NEGATIVE
Fecal Occult Bld: NEGATIVE

## 2021-09-20 NOTE — Progress Notes (Signed)
Stool card order placed

## 2021-09-27 ENCOUNTER — Encounter (HOSPITAL_BASED_OUTPATIENT_CLINIC_OR_DEPARTMENT_OTHER): Payer: Self-pay

## 2021-09-27 ENCOUNTER — Telehealth: Payer: Self-pay | Admitting: *Deleted

## 2021-09-27 ENCOUNTER — Other Ambulatory Visit: Payer: Self-pay | Admitting: *Deleted

## 2021-09-27 ENCOUNTER — Ambulatory Visit (HOSPITAL_BASED_OUTPATIENT_CLINIC_OR_DEPARTMENT_OTHER)
Admission: RE | Admit: 2021-09-27 | Discharge: 2021-09-27 | Disposition: A | Discharge status: Home / Self Care | Payer: Medicare Other | Source: Ambulatory Visit | Attending: Hematology & Oncology | Admitting: Hematology & Oncology

## 2021-09-27 ENCOUNTER — Inpatient Hospital Stay: Payer: Medicare Other | Attending: Family Medicine

## 2021-09-27 ENCOUNTER — Other Ambulatory Visit: Payer: Self-pay

## 2021-09-27 DIAGNOSIS — D500 Iron deficiency anemia secondary to blood loss (chronic): Secondary | ICD-10-CM

## 2021-09-27 DIAGNOSIS — C250 Malignant neoplasm of head of pancreas: Secondary | ICD-10-CM | POA: Diagnosis not present

## 2021-09-27 LAB — CBC WITH DIFFERENTIAL (CANCER CENTER ONLY)
Abs Immature Granulocytes: 0.02 10*3/uL (ref 0.00–0.07)
Basophils Absolute: 0 10*3/uL (ref 0.0–0.1)
Basophils Relative: 0 %
Eosinophils Absolute: 0.1 10*3/uL (ref 0.0–0.5)
Eosinophils Relative: 2 %
HCT: 37.6 % — ABNORMAL LOW (ref 39.0–52.0)
Hemoglobin: 12.1 g/dL — ABNORMAL LOW (ref 13.0–17.0)
Immature Granulocytes: 0 %
Lymphocytes Relative: 14 %
Lymphs Abs: 0.7 10*3/uL (ref 0.7–4.0)
MCH: 29.3 pg (ref 26.0–34.0)
MCHC: 32.2 g/dL (ref 30.0–36.0)
MCV: 91 fL (ref 80.0–100.0)
Monocytes Absolute: 0.6 10*3/uL (ref 0.1–1.0)
Monocytes Relative: 12 %
Neutro Abs: 3.7 10*3/uL (ref 1.7–7.7)
Neutrophils Relative %: 72 %
Platelet Count: 165 10*3/uL (ref 150–400)
RBC: 4.13 MIL/uL — ABNORMAL LOW (ref 4.22–5.81)
RDW: 14.8 % (ref 11.5–15.5)
WBC Count: 5.1 10*3/uL (ref 4.0–10.5)
nRBC: 0 % (ref 0.0–0.2)

## 2021-09-27 LAB — CMP (CANCER CENTER ONLY)
ALT: 7 U/L (ref 0–44)
AST: 12 U/L — ABNORMAL LOW (ref 15–41)
Albumin: 4 g/dL (ref 3.5–5.0)
Alkaline Phosphatase: 65 U/L (ref 38–126)
Anion gap: 6 (ref 5–15)
BUN: 11 mg/dL (ref 8–23)
CO2: 31 mmol/L (ref 22–32)
Calcium: 9.5 mg/dL (ref 8.9–10.3)
Chloride: 104 mmol/L (ref 98–111)
Creatinine: 0.83 mg/dL (ref 0.61–1.24)
GFR, Estimated: >60 mL/min (ref >60)
Glucose, Bld: 91 mg/dL (ref 70–99)
Potassium: 4.2 mmol/L (ref 3.5–5.1)
Sodium: 141 mmol/L (ref 135–145)
Total Bilirubin: 0.4 mg/dL (ref 0.3–1.2)
Total Protein: 6.5 g/dL (ref 6.5–8.1)

## 2021-09-27 MED ORDER — IOHEXOL 300 MG/ML  SOLN
100.0000 mL | Freq: Once | INTRAMUSCULAR | Status: AC | PRN
  Administered 2021-09-27: 100 mL via INTRAVENOUS

## 2021-09-27 NOTE — Telephone Encounter (Signed)
As noted below by Dr. Marin Olp, I informed the patient that the CT scan looks OK, there is no obvious cancer! He verbalized understanding.

## 2021-09-27 NOTE — Telephone Encounter (Signed)
-----   Message from Volanda Napoleon, MD sent at 09/27/2021  2:01 PM EST ----- Call - the CT scan looks ok -- no obvious cancer!!  Noah Scott

## 2021-10-16 ENCOUNTER — Encounter: Payer: Self-pay | Admitting: Hematology & Oncology

## 2021-10-16 ENCOUNTER — Inpatient Hospital Stay (HOSPITAL_BASED_OUTPATIENT_CLINIC_OR_DEPARTMENT_OTHER): Payer: Medicare Other | Admitting: Hematology & Oncology

## 2021-10-16 ENCOUNTER — Inpatient Hospital Stay: Payer: Medicare Other

## 2021-10-16 ENCOUNTER — Other Ambulatory Visit: Payer: Self-pay

## 2021-10-16 ENCOUNTER — Telehealth: Payer: Self-pay | Admitting: *Deleted

## 2021-10-16 VITALS — BP 106/62 | HR 75 | Temp 98.4°F | Resp 18 | Wt 200.0 lb

## 2021-10-16 DIAGNOSIS — R634 Abnormal weight loss: Secondary | ICD-10-CM

## 2021-10-16 DIAGNOSIS — D509 Iron deficiency anemia, unspecified: Secondary | ICD-10-CM | POA: Diagnosis present

## 2021-10-16 DIAGNOSIS — D500 Iron deficiency anemia secondary to blood loss (chronic): Secondary | ICD-10-CM

## 2021-10-16 LAB — CBC WITH DIFFERENTIAL (CANCER CENTER ONLY)
Abs Immature Granulocytes: 0.03 10*3/uL (ref 0.00–0.07)
Basophils Absolute: 0 10*3/uL (ref 0.0–0.1)
Basophils Relative: 0 %
Eosinophils Absolute: 0.1 10*3/uL (ref 0.0–0.5)
Eosinophils Relative: 2 %
HCT: 39.1 % (ref 39.0–52.0)
Hemoglobin: 13 g/dL (ref 13.0–17.0)
Immature Granulocytes: 1 %
Lymphocytes Relative: 14 %
Lymphs Abs: 0.8 10*3/uL (ref 0.7–4.0)
MCH: 29.9 pg (ref 26.0–34.0)
MCHC: 33.2 g/dL (ref 30.0–36.0)
MCV: 89.9 fL (ref 80.0–100.0)
Monocytes Absolute: 0.6 10*3/uL (ref 0.1–1.0)
Monocytes Relative: 11 %
Neutro Abs: 4.1 10*3/uL (ref 1.7–7.7)
Neutrophils Relative %: 72 %
Platelet Count: 194 10*3/uL (ref 150–400)
RBC: 4.35 MIL/uL (ref 4.22–5.81)
RDW: 14.7 % (ref 11.5–15.5)
WBC Count: 5.7 10*3/uL (ref 4.0–10.5)
nRBC: 0 % (ref 0.0–0.2)

## 2021-10-16 LAB — CMP (CANCER CENTER ONLY)
ALT: 9 U/L (ref 0–44)
AST: 14 U/L — ABNORMAL LOW (ref 15–41)
Albumin: 4 g/dL (ref 3.5–5.0)
Alkaline Phosphatase: 64 U/L (ref 38–126)
Anion gap: 5 (ref 5–15)
BUN: 19 mg/dL (ref 8–23)
CO2: 33 mmol/L — ABNORMAL HIGH (ref 22–32)
Calcium: 9.7 mg/dL (ref 8.9–10.3)
Chloride: 103 mmol/L (ref 98–111)
Creatinine: 0.91 mg/dL (ref 0.61–1.24)
GFR, Estimated: >60 mL/min (ref >60)
Glucose, Bld: 101 mg/dL — ABNORMAL HIGH (ref 70–99)
Potassium: 4.5 mmol/L (ref 3.5–5.1)
Sodium: 141 mmol/L (ref 135–145)
Total Bilirubin: 0.4 mg/dL (ref 0.3–1.2)
Total Protein: 6.6 g/dL (ref 6.5–8.1)

## 2021-10-16 LAB — RETICULOCYTES
Immature Retic Fract: 9.9 % (ref 2.3–15.9)
RBC.: 4.34 MIL/uL (ref 4.22–5.81)
Retic Count, Absolute: 40.8 10*3/uL (ref 19.0–186.0)
Retic Ct Pct: 0.9 % (ref 0.4–3.1)

## 2021-10-16 LAB — IRON AND IRON BINDING CAPACITY (CC-WL,HP ONLY)
Iron: 71 ug/dL (ref 45–182)
Saturation Ratios: 28 % (ref 17.9–39.5)
TIBC: 252 ug/dL (ref 250–450)
UIBC: 181 ug/dL (ref 117–376)

## 2021-10-16 LAB — FERRITIN: Ferritin: 703 ng/mL — ABNORMAL HIGH (ref 24–336)

## 2021-10-16 NOTE — Progress Notes (Signed)
Hematology and Oncology Follow Up Visit  Noah Scott [id number] 01-06-1936 86 y.o. 10/16/2021   Principle Diagnosis:  Iron deficiency anemia  Current Therapy:   IV iron given-Monoferric-on 08/17/2021     Interim History:  Noah Scott is back for his follow-up.  He is doing pretty well.  We did do a CT scan on him.  This was done on 09/25/2021.  There was little bit of left side of pleural thickening.  I suspect this might be from occupational exposures.  He had to inguinal hernias.  1 on the right side and 1 on the left side.  His liver looked okay.  He had small liver lesions which were felt to be cysts.  He did do a stool for [place].  His stool guaiac was all negative.  He has had no problems with his bowels or bladder.  He has had no cough.  He has had no leg swelling.  Overall, I would say performance status is probably ECOG 2.  quite a few medications.  Medications:  Current Outpatient Medications:    Aromatic Inhalants (VICKS VAPOR INHALER IN), Inhale 1 Inhaler into the lungs as needed (stuffy nose)., Disp: , Rfl:    furosemide (LASIX) 20 MG tablet, Take 40 mg (two tablets) every other day, 20 mg (one tablet) on odd day. Make take an additonal 20 mg as needed daily for weight gain of 3 lbs overnight or increase swelling, Disp: 200 tablet, Rfl: 3   ibuprofen (ADVIL) 600 MG tablet, Take 600 mg by mouth 3 (three) times daily., Disp: , Rfl:    lidocaine (XYLOCAINE) 1 % (with preservative) injection, by Infiltration route., Disp: , Rfl:    losartan (COZAAR) 25 MG tablet, Take 1 tablet (25 mg total) by mouth daily., Disp: 30 tablet, Rfl: 5   metoprolol tartrate (LOPRESSOR) 25 MG tablet, Take 0.5 tablets (12.5 mg total) by mouth 2 (two) times daily. (Patient taking differently: Take 25 mg by mouth 2 (two) times daily.), Disp: 30 tablet, Rfl: 3   nitroGLYCERIN (NITROSTAT) 0.4 MG SL tablet, Place 1 tablet (0.4 mg total) under the tongue every 5 (five) minutes as needed for chest pain., Disp: 25  tablet, Rfl: 3   potassium chloride SA (KLOR-CON M20) 20 MEQ tablet, Take 1 tablet (20 mEq total) by mouth daily. (Patient taking differently: Take 10 mEq by mouth daily.), Disp: 90 tablet, Rfl: 3   prasugrel (EFFIENT) 10 MG TABS tablet, TAKE 1 TABLET(10 MG) BY MOUTH DAILY, Disp: 90 tablet, Rfl: 2   rosuvastatin (CRESTOR) 20 MG tablet, Take 1 tablet (20 mg total) by mouth daily., Disp: 90 tablet, Rfl: 3   traMADol (ULTRAM) 50 MG tablet, Take by mouth every 6 (six) hours as needed., Disp: , Rfl:    triamcinolone acetonide (TRIESENCE) 40 MG/ML SUSP, Inject into the articular space., Disp: , Rfl:   Allergies:  Allergies  Allergen Reactions   Clopidogrel Other (See Comments) and Swelling   Plavix [Clopidogrel Bisulfate] Swelling   Brilinta [Ticagrelor] Other (See Comments)     Developed a cough   Enalapril    Other     Past Medical History, Surgical history, Social history, and Family History were reviewed and updated.  Review of Systems: Review of Systems  Constitutional:  Positive for unexpected weight change.  HENT:  Negative.    Eyes: Negative.   Respiratory: Negative.    Cardiovascular: Negative.   Gastrointestinal: Negative.   Endocrine: Negative.   Genitourinary: Negative.    Musculoskeletal: Negative.  Skin: Negative.   Neurological: Negative.   Hematological: Negative.   Psychiatric/Behavioral: Negative.     Physical Exam:  weight is 200 lb (90.7 kg). His oral temperature is 98.4 F (36.9 C). His blood pressure is 106/62 and his pulse is 75. His respiration is 18 and oxygen saturation is 97%.   Wt Readings from Last 3 Encounters:  10/16/21 200 lb (90.7 kg)  09/07/21 198 lb (89.8 kg)  08/07/21 202 lb (91.6 kg)    Physical Exam Vitals reviewed.  HENT:     Head: Normocephalic and atraumatic.  Eyes:     Pupils: Pupils are equal, round, and reactive to light.  Cardiovascular:     Rate and Rhythm: Normal rate and regular rhythm.     Heart sounds: Normal heart  sounds.  Pulmonary:     Effort: Pulmonary effort is normal.     Breath sounds: Normal breath sounds.  Abdominal:     General: Bowel sounds are normal.     Palpations: Abdomen is soft.  Musculoskeletal:        General: No tenderness or deformity. Normal range of motion.     Cervical back: Normal range of motion.  Lymphadenopathy:     Cervical: No cervical adenopathy.  Skin:    General: Skin is warm and dry.     Findings: No erythema or rash.  Neurological:     Mental Status: He is alert and oriented to person, place, and time.  Psychiatric:        Behavior: Behavior normal.        Thought Content: Thought content normal.        Judgment: Judgment normal.     Lab Results  Component Value Date   WBC 5.7 10/16/2021   HGB 13.0 10/16/2021   HCT 39.1 10/16/2021   MCV 89.9 10/16/2021   PLT 194 10/16/2021     Chemistry      Component Value Date/Time   NA 141 09/27/2021 0929   NA 139 02/20/2021 1430   K 4.2 09/27/2021 0929   CL 104 09/27/2021 0929   CO2 31 09/27/2021 0929   BUN 11 09/27/2021 0929   BUN 16 02/20/2021 1430   CREATININE 0.83 09/27/2021 0929      Component Value Date/Time   CALCIUM 9.5 09/27/2021 0929   ALKPHOS 65 09/27/2021 0929   AST 12 (L) 09/27/2021 0929   ALT 7 09/27/2021 0929   BILITOT 0.4 09/27/2021 0929     Impression and Plan: Noah Scott is a very nice 86 year old white male.  He is originally from [place].  He is a lot of fun to talk to.  Again, he is responded well to the IV iron.  Not very happy about this.  He obviously has some issue with malabsorption.  We will see what his iron studies show.  I think we can probably get him back now in the Spring.  We tried to move his appointments out a little bit further.   Volanda Napoleon, MD 1/30/202310:13 AM

## 2021-10-16 NOTE — Telephone Encounter (Signed)
As noted below by Dr. Marin Olp, I informed the patient that the iron studies look great. He verbalized understanding.

## 2021-10-16 NOTE — Telephone Encounter (Signed)
-----   Message from Noah Napoleon, MD sent at 10/16/2021  1:37 PM EST ----- Please call let him know that the iron studies look great.  [name]

## 2021-10-16 NOTE — Telephone Encounter (Signed)
Per 10/16/21 los - gave upcoming appointments - confirmed

## 2021-10-20 LAB — COMPREHENSIVE METABOLIC PANEL
ALT: 11 IU/L (ref 0–44)
AST: 16 IU/L (ref 0–40)
Albumin/Globulin Ratio: 1.9 (ref 1.2–2.2)
Albumin: 4.2 g/dL (ref 3.6–4.6)
Alkaline Phosphatase: 79 IU/L (ref 44–121)
BUN/Creatinine Ratio: 13 (ref 10–24)
BUN: 11 mg/dL (ref 8–27)
Bilirubin Total: 0.3 mg/dL (ref 0.0–1.2)
CO2: 28 mmol/L (ref 20–29)
Calcium: 8.8 mg/dL (ref 8.6–10.2)
Chloride: 102 mmol/L (ref 96–106)
Creatinine, Ser: 0.84 mg/dL (ref 0.76–1.27)
Globulin, Total: 2.2 g/dL (ref 1.5–4.5)
Glucose: 98 mg/dL (ref 70–99)
Potassium: 4.5 mmol/L (ref 3.5–5.2)
Sodium: 141 mmol/L (ref 134–144)
Total Protein: 6.4 g/dL (ref 6.0–8.5)
eGFR: 87 mL/min/{1.73_m2} (ref >59)

## 2021-10-20 LAB — LIPID PANEL
Chol/HDL Ratio: 2.6 ratio (ref 0.0–5.0)
Cholesterol, Total: 124 mg/dL (ref 100–199)
HDL: 47 mg/dL (ref >39)
LDL Chol Calc (NIH): 65 mg/dL (ref 0–99)
Triglycerides: 53 mg/dL (ref 0–149)
VLDL Cholesterol Cal: 12 mg/dL (ref 5–40)

## 2021-11-06 ENCOUNTER — Other Ambulatory Visit: Payer: Self-pay | Admitting: Cardiology

## 2021-11-13 ENCOUNTER — Encounter: Payer: Self-pay | Admitting: Cardiology

## 2021-11-13 ENCOUNTER — Other Ambulatory Visit: Payer: Self-pay

## 2021-11-13 ENCOUNTER — Ambulatory Visit (INDEPENDENT_AMBULATORY_CARE_PROVIDER_SITE_OTHER): Payer: Medicare Other | Admitting: Cardiology

## 2021-11-13 VITALS — BP 122/65 | HR 65 | Ht 70.0 in | Wt 201.6 lb

## 2021-11-13 DIAGNOSIS — I214 Non-ST elevation (NSTEMI) myocardial infarction: Secondary | ICD-10-CM | POA: Diagnosis not present

## 2021-11-13 DIAGNOSIS — K909 Intestinal malabsorption, unspecified: Secondary | ICD-10-CM

## 2021-11-13 DIAGNOSIS — I77810 Thoracic aortic ectasia: Secondary | ICD-10-CM

## 2021-11-13 DIAGNOSIS — E669 Obesity, unspecified: Secondary | ICD-10-CM

## 2021-11-13 DIAGNOSIS — I2510 Atherosclerotic heart disease of native coronary artery without angina pectoris: Secondary | ICD-10-CM | POA: Diagnosis not present

## 2021-11-13 DIAGNOSIS — I25118 Atherosclerotic heart disease of native coronary artery with other forms of angina pectoris: Secondary | ICD-10-CM

## 2021-11-13 DIAGNOSIS — E785 Hyperlipidemia, unspecified: Secondary | ICD-10-CM | POA: Diagnosis not present

## 2021-11-13 DIAGNOSIS — Z9861 Coronary angioplasty status: Secondary | ICD-10-CM

## 2021-11-13 DIAGNOSIS — I219 Acute myocardial infarction, unspecified: Secondary | ICD-10-CM

## 2021-11-13 DIAGNOSIS — I498 Other specified cardiac arrhythmias: Secondary | ICD-10-CM

## 2021-11-13 DIAGNOSIS — R600 Localized edema: Secondary | ICD-10-CM

## 2021-11-13 DIAGNOSIS — I10 Essential (primary) hypertension: Secondary | ICD-10-CM

## 2021-11-13 NOTE — Patient Instructions (Signed)
Medication Instructions:  No changes  *If you need a refill on your cardiac medications before your next appointment, please call your pharmacy*   Lab Work: fasting 1 to 2 weeks  cmp Cbc hgbA1c lipid If you have labs (blood work) drawn today and your tests are completely normal, you will receive your results only by: [place] (if you have MyChart) OR A paper copy in the mail If you have any lab test that is abnormal or we need to change your treatment, we will call you to review the results.   Testing/Procedures: Not needed   Follow-Up: At [facility], you and your health needs are our priority.  As part of our continuing mission to provide you with exceptional heart care, we have created designated Provider Care Teams.  These Care Teams include your primary Cardiologist (physician) and Advanced Practice Providers (APPs -  Physician Assistants and Nurse Practitioners) who all work together to provide you with the care you need, when you need it.  We recommend signing up for the patient portal called "MyChart".  Sign up information is provided on this After Visit Summary.  MyChart is used to connect with patients for Virtual Visits (Telemedicine).  Patients are able to view lab/test results, encounter notes, upcoming appointments, etc.  Non-urgent messages can be sent to your provider as well.   To learn more about what you can do with MyChart, go to [email or web address].    Your next appointment:   6 month(s)  The format for your next appointment:   In Person  Provider:   Coletta Memos, FNP or Caron Presume, PA-C    Then, Glenetta Hew, MD will plan to see you again in 12 month(s).

## 2021-11-13 NOTE — Progress Notes (Unsigned)
Primary Care Provider: Einar Pheasant, DO Cardiologist: Noah Hew, MD Electrophysiologist: None  Clinic Note: No chief complaint on file.   ===================================  ASSESSMENT/PLAN   Problem List Items Addressed This Visit       Cardiology Problems   CAD S/P percutaneous coronary angioplasty (Chronic)   Coronary artery disease involving native coronary artery of native heart (Chronic)   Hyperlipidemia with target LDL less than 70 (Chronic)   NSTEMI (non-ST elevated myocardial infarction) ([place]) - Primary (Chronic)   Essential (primary) hypertension (Chronic)     Other   Fluttering heart   Bilateral leg edema (Chronic)    ===================================  HPI:    Noah Scott is a 86 y.o. male with a PMH below who presents today for ***.  NSTEMI 11/21/2017 (He noted that his "ANGINA" equivalent is "severe heartburn radiating to his throat") --> Cath-PCI m-dRCA (DES x 1) & Ost-Prox RCA (2 overlapping DES); moderate-severe ISR and OM1 stent-medical management. "Allergic reaction" to Plavix and Brilinta-therefore on maintenance dose Effient Noah Scott was last seen on 05/11/2021 to discuss the results of Zio patch monitor, venous duplex and echocardiogram.  He had noted worsening edema.  No PND orthopnea.  No exertional dyspnea.  Activity limited by knee and back pain.  No resting exertional chest pain or pressure.  Intermittent palpitations but nothing prolonged.  Had lost 30 pounds just eating less.  Noted breathing better.  Recent Hospitalizations: ***  Reviewed  CV studies:    The following studies were reviewed today: (if available, images/films reviewed: From Epic Chart or Care Everywhere) ***:   Interval History:   Noah Scott   CV Review of Symptoms (Summary) Cardiovascular ROS: {roscv:310661} Cardiovascular ROS: positive for - edema and some lightheadedness.  Rare intermittent palpitations negative for - chest pain, dyspnea on exertion,  orthopnea, paroxysmal nocturnal dyspnea, rapid heart rate, shortness of breath, or syncope or near syncope, TIA/amaurosis fugax.  He has had some orthostatic dizziness but no near syncope.    REVIEWED OF SYSTEMS   ROS  Constitutional:  Positive for malaise/fatigue (Really limited by back and knee pain.) and weight loss (Consistent weight loss, and just basically eating less.  30 pounds from last visit.  Has been losing weight at last visit as well.).  HENT:  Negative for nosebleeds.   Respiratory:  Negative for cough, shortness of breath and wheezing.   Cardiovascular:  Positive for leg swelling. Negative for claudication.  Gastrointestinal:  Negative for blood in stool and melena.  Genitourinary:  Negative for hematuria.  Musculoskeletal:  Positive for back pain (With radiculopathy), joint pain (Mostly right knee more than left.  Also the muscles around the knee) and myalgias (Intermittent leg cramping, worse at night.).  Neurological:  Positive for dizziness, tingling and focal weakness (Legs can go a week.). Negative for loss of consciousness.       Poor balance.  Psychiatric/Behavioral:  Positive for memory loss. Negative for depression (Seems to be down, may be more dysthymia.). The patient has insomnia (Has tried to take sleeping pills.  Difficulty sleeping mostly because of discomfort.).    I have reviewed and (if needed) personally updated the patient's problem list, medications, allergies, past medical and surgical history, social and family history.   PAST MEDICAL HISTORY   Past Medical History:  Diagnosis Date   CAD S/P percutaneous coronary angioplasty 2001   a) 2001 ([place]): PCI OM1 - now with ~65% ISR.;; b) NSTEMI 11/2017 - ost-prox RCA 75-65% (Overlapping ONYX DES 3.0 x  26 & 3.0 x 8 --> 3.5-3.3 mm). m-dRCA 95% (ONYX DES 2.75 x 18 mm -> 3.0 mm). Ost OM1 65% ISR (med Rx). dRCA-OstRDA (50%-25%)   Essential (primary) hypertension 11/21/2017   Hyperlipidemia 11/21/2017    Iron deficiency anemia due to chronic blood loss 08/08/2021   Iron malabsorption 08/08/2021   Neurogenic claudication due to lumbar spinal stenosis 02/06/2018   NSTEMI (non-ST elevated myocardial infarction) ([place]) 11/21/2017   Multiple RCA lesions - 2 Overlapping DES Ost-Prox RCA & 1 in mid-distal RCA   Spinal stenosis of lumbar region    Venous reflux 11/2020   Venous Dopplers 12/15/2020: No DVT or superficial venous thrombosis bilaterally.  Venous reflux noted in bilateral FV and saphenofemoral junctions, R GSV in the thigh and R SFV, left proximal thigh GSV. -->  No invasive options due to deep and superficial venous reflux.    PAST SURGICAL HISTORY   Past Surgical History:  Procedure Laterality Date   CORONARY STENT INTERVENTION N/A 11/25/2017   Procedure: CORONARY STENT INTERVENTION;  Surgeon: Leonie Man, MD;  Location: [facility];  Service: Cardiovascular:   m-dRCA 95%  (DES PCI - 0%.  Resolute ONYX DES 2.75 x 18 mm - ~3.0 mm). Ost-proxRCA 75-65% (DES PCI-0%. 2 overlapping Resolute ONYX DES 3.0 x 26 & 3.0 x 8 mm --> 3.5-3.3 mm)   CORONARY STENT INTERVENTION  2001   ([place]): PCI OM1   LEA DOPPLERS  12/12/2017   Normal bilateral ABIs and TBI's.   LEFT HEART CATH AND CORONARY ANGIOGRAPHY N/A 11/25/2017   Procedure: LEFT HEART CATH AND CORONARY ANGIOGRAPHY;  Surgeon: Leonie Man, MD;  Location: [facility];  Service: Cardiovascular:  Ost-proxRCA 75&65%(DES PCI), m-dRCA 95% (DES PCI), dRCA 50%-25% ost rPDA. ostOM1 65% ISR (from 2001).    LUMBAR LAMINECTOMY/DECOMPRESSION MICRODISCECTOMY Bilateral 06/13/2018   Procedure: Laminectomy and Foraminotomy - L1-L2 - L2-L3 - L3-L4 - bilateral;  Surgeon: Eustace Moore, MD;  Location: [facility];  Service: Neurosurgery;  Laterality: Bilateral;   TRANSTHORACIC ECHOCARDIOGRAM  11/23/2017   a) 11/2017: Nl LV size & Fxn. EF 60 - 65%.  No RWMA.  GR 1 DD.  Mild RV dilation.; b) 05/2020:([facility]): Normal LV size and function.  EF 60 and  65%.  Normal RV size and function.  Aortic sclerosis with no stenosis.  No AI.  Mildly dilated ascending aorta.  CVP ~5 mm.   TRANSTHORACIC ECHOCARDIOGRAM  03/31/2021   EF 55 to 60%.  GR 1 DD.  Ascending aorta measured at 41 mm-moderate dilation.  Recommended follow-up with imaging study in 6 months to reassess.   Zio Patch Monitor Jan 2022: Mostly NSR - 47-118 bpm w/ Avg 64 bpm. Rare PACs & PVCx. 12 brief runs of SVT/PAT (fastest 10 beats - 144 bpm, longest 20 beats 92 bpm).  -- No Afib or sustained arrhythmias.  SVT/PAT episodes not noted on diary. Venous Dopplers 12/15/2020: No DVT or superficial venous thrombosis bilaterally.  Venous reflux noted in bilateral FV and saphenofemoral junctions, R GSV in the thigh and R SFV, left proximal thigh GSV. No Invasive options with Deep & GSV reflux. TTE 03/31/2021: EF 55 to 60%.  GR 1 DD.  Ascending aorta measured at 41 mm-moderate dilation.  Recommended follow-up with imaging study in 6 months to reassess.  Cardiac Cath images from March 2019       Immunization History  Administered Date(s) Administered   Influenza, High Dose Seasonal PF 06/23/2017   Influenza-Unspecified 08/09/2005, 07/18/2006, 06/19/2019  Moderna Sars-Covid-2 Vaccination 10/31/2019, 11/28/2019, 07/25/2020   Pneumococcal Conjugate-13 02/19/2014   Zoster Recombinat (Shingrix) 01/14/2017, 06/28/2017, 06/28/2017   Zoster, Live 02/19/2014    MEDICATIONS/ALLERGIES   Current Meds  Medication Sig   Aromatic Inhalants (VICKS VAPOR INHALER IN) Inhale 1 Inhaler into the lungs as needed (stuffy nose).   furosemide (LASIX) 20 MG tablet Take 40 mg (two tablets) every other day, 20 mg (one tablet) on odd day. Make take an additonal 20 mg as needed daily for weight gain of 3 lbs overnight or increase swelling   ibuprofen (ADVIL) 600 MG tablet Take 600 mg by mouth 3 (three) times daily.   ibuprofen (ADVIL) 800 MG tablet Take 800 mg by mouth as needed.   lidocaine (XYLOCAINE) 1 % (with  preservative) injection by Infiltration route.   losartan (COZAAR) 25 MG tablet TAKE 1 TABLET(25 MG) BY MOUTH DAILY   metoprolol tartrate (LOPRESSOR) 25 MG tablet Take 0.5 tablets (12.5 mg total) by mouth 2 (two) times daily. (Patient taking differently: Take 25 mg by mouth 2 (two) times daily.)   nitroGLYCERIN (NITROSTAT) 0.4 MG SL tablet Place 1 tablet (0.4 mg total) under the tongue every 5 (five) minutes as needed for chest pain.   prasugrel (EFFIENT) 10 MG TABS tablet TAKE 1 TABLET(10 MG) BY MOUTH DAILY   rosuvastatin (CRESTOR) 20 MG tablet Take 1 tablet (20 mg total) by mouth daily.   traMADol (ULTRAM) 50 MG tablet Take by mouth every 6 (six) hours as needed.    Allergies  Allergen Reactions   Clopidogrel Other (See Comments) and Swelling   Plavix [Clopidogrel Bisulfate] Swelling   Brilinta [Ticagrelor] Other (See Comments)     Developed a cough   Enalapril    Other     SOCIAL HISTORY/FAMILY HISTORY   Reviewed in Epic:  Pertinent findings:  Social History   Tobacco Use   Smoking status: Former    Types: Cigarettes    Quit date: 03/02/1969    Years since quitting: 52.7   Smokeless tobacco: Never  Vaping Use   Vaping Use: Never used  Substance Use Topics   Alcohol use: Not Currently   Drug use: No   Social History   Social History Narrative   Lives alone in an apartment on the first floor.  Has one daughter.  Retired Geophysical data processor.  Education: some college.     OBJCTIVE -PE, EKG, labs   Wt Readings from Last 3 Encounters:  11/13/21 201 lb 9.6 oz (91.4 kg)  10/16/21 200 lb (90.7 kg)  09/07/21 198 lb (89.8 kg)    Physical Exam: BP 122/65    Pulse 65    Ht 5\' 10"  (1.778 m)    Wt 201 lb 9.6 oz (91.4 kg)    SpO2 99%    BMI 28.93 kg/m  Physical Exam Somewhat tired and fatigued looking, but not ill or toxic appearing Distant heart sounds.  1+ bilateral ankle edema.  Stiff neck. Abnormal stiff rigid gait.  Wide-based.  Abnormal coordination.   Adult ECG  Report  Rate: *** ;  Rhythm: {rhythm:17366};   Narrative Interpretation: ***  Recent Labs:  ***  Lab Results  Component Value Date   CHOL 124 10/19/2021   HDL 47 10/19/2021   LDLCALC 65 10/19/2021   TRIG 53 10/19/2021   CHOLHDL 2.6 10/19/2021   Lab Results  Component Value Date   CREATININE 0.84 10/19/2021   BUN 11 10/19/2021   NA 141 10/19/2021   K 4.5 10/19/2021  CL 102 10/19/2021   CO2 28 10/19/2021   CBC Latest Ref Rng & Units 10/16/2021 09/27/2021 09/07/2021  WBC 4.0 - 10.5 K/uL 5.7 5.1 5.1  Hemoglobin 13.0 - 17.0 g/dL 13.0 12.1(L) 11.9(L)  Hematocrit 39.0 - 52.0 % 39.1 37.6(L) 37.1(L)  Platelets 150 - 400 K/uL 194 165 183    No results found for: HGBA1C No results found for: TSH  ==================================================  COVID-19 Education: The signs and symptoms of COVID-19 were discussed with the patient and how to seek care for testing (follow up with PCP or arrange E-visit).    I spent a total of ***minutes with the patient spent in direct patient consultation.  Additional time spent with chart review  / charting (studies, outside notes, etc): *** min Total Time: *** min  Current medicines are reviewed at length with the patient today.  (+/- concerns) ***  This visit occurred during the SARS-CoV-2 public health emergency.  Safety protocols were in place, including screening questions prior to the visit, additional usage of staff PPE, and extensive cleaning of exam room while observing appropriate contact time as indicated for disinfecting solutions.  Notice: This dictation was prepared with Dragon dictation along with smart phrase technology. Any transcriptional errors that result from this process are unintentional and may not be corrected upon review.  Studies Ordered:   No orders of the defined types were placed in this encounter.   Patient Instructions / Medication Changes & Studies & Tests Ordered   There are no Patient Instructions on  file for this visit.     Noah Scott, M.D., M.S. Interventional Cardiologist   Pager # [phone] Phone # [phone] [address]. [place], [place] [address]   Thank you for choosing Heartcare at [facility]!!

## 2021-11-21 LAB — LIPID PANEL

## 2021-11-22 LAB — COMPREHENSIVE METABOLIC PANEL
ALT: 9 IU/L (ref 0–44)
AST: 16 IU/L (ref 0–40)
Albumin/Globulin Ratio: 1.7 (ref 1.2–2.2)
Albumin: 3.9 g/dL (ref 3.6–4.6)
Alkaline Phosphatase: 77 IU/L (ref 44–121)
BUN/Creatinine Ratio: 13 (ref 10–24)
BUN: 12 mg/dL (ref 8–27)
Bilirubin Total: 0.4 mg/dL (ref 0.0–1.2)
CO2: 27 mmol/L (ref 20–29)
Calcium: 8.9 mg/dL (ref 8.6–10.2)
Chloride: 100 mmol/L (ref 96–106)
Creatinine, Ser: 0.93 mg/dL (ref 0.76–1.27)
Globulin, Total: 2.3 g/dL (ref 1.5–4.5)
Glucose: 97 mg/dL (ref 70–99)
Potassium: 3.8 mmol/L (ref 3.5–5.2)
Sodium: 142 mmol/L (ref 134–144)
Total Protein: 6.2 g/dL (ref 6.0–8.5)
eGFR: 81 mL/min/{1.73_m2} (ref >59)

## 2021-11-22 LAB — CBC
Hematocrit: 36.4 % — ABNORMAL LOW (ref 37.5–51.0)
Hemoglobin: 12.2 g/dL — ABNORMAL LOW (ref 13.0–17.7)
MCH: 30.1 pg (ref 26.6–33.0)
MCHC: 33.5 g/dL (ref 31.5–35.7)
MCV: 90 fL (ref 79–97)
Platelets: 220 10*3/uL (ref 150–450)
RBC: 4.05 x10E6/uL — ABNORMAL LOW (ref 4.14–5.80)
RDW: 13.5 % (ref 11.6–15.4)
WBC: 4.3 10*3/uL (ref 3.4–10.8)

## 2021-11-22 LAB — LIPID PANEL
Chol/HDL Ratio: 2.5 ratio (ref 0.0–5.0)
Cholesterol, Total: 119 mg/dL (ref 100–199)
HDL: 47 mg/dL (ref >39)
LDL Chol Calc (NIH): 59 mg/dL (ref 0–99)
Triglycerides: 61 mg/dL (ref 0–149)
VLDL Cholesterol Cal: 13 mg/dL (ref 5–40)

## 2021-11-22 LAB — HEMOGLOBIN A1C
Est. average glucose Bld gHb Est-mCnc: 120 mg/dL
Hgb A1c MFr Bld: 5.8 % — ABNORMAL HIGH (ref 4.8–5.6)

## 2021-12-10 ENCOUNTER — Encounter: Payer: Self-pay | Admitting: Cardiology

## 2021-12-10 NOTE — Assessment & Plan Note (Addendum)
Proximal and distal RCA stents placed.  He is now on maintenance dose prasugrel.  Okay to hold for procedures or surgeries for 5 to 7 days preop. ?

## 2021-12-10 NOTE — Assessment & Plan Note (Signed)
Stable echo. ?Would probably check a CT angiogram of the chest just to reassess in roughly 6 months.  If stable, we can then check every other year. ?

## 2021-12-10 NOTE — Assessment & Plan Note (Signed)
Relatively benign at this point.  No new findings. ?

## 2021-12-10 NOTE — Assessment & Plan Note (Signed)
Labs were just drawn.  LDL 65.  Well-controlled.  Continue rosuvastatin. ?

## 2021-12-10 NOTE — Assessment & Plan Note (Signed)
The patient understands the need to lose weight with diet and exercise. We have discussed specific strategies for this. ?He denies bloody threshold of obesity.  Has done well with weight loss. ?

## 2021-12-10 NOTE — Assessment & Plan Note (Signed)
Roughly 4 years out from MI.  No recurrent symptoms. ?

## 2021-12-10 NOTE — Assessment & Plan Note (Signed)
Probably is combination of deep superficial venous stasis. ?Recommend intermittent elevation and support stockings. ?

## 2021-12-10 NOTE — Assessment & Plan Note (Signed)
On iron supplementation. ?

## 2021-12-10 NOTE — Assessment & Plan Note (Signed)
Blood pressure well controlled off losartan and Lopressor.  No change. ? ?He does have tinnitus furosemide which he does take an occasional additional dose.  He takes 40 mg every other day and Corlanor every other day unless he needs more for edema. ?

## 2021-12-10 NOTE — Assessment & Plan Note (Signed)
Extensive PCI of the RCA and stenting non-STEMI and then a stent restenosis of the LAD treated medically.  At this point probably would not do the stress test unless he has more symptoms.  Nonischemic Myoview in the past with normal echo. ? ?Plan: Need to ensure that he is actually taking rosuvastatin 20 mg daily.  ?He is on prasugrel for Thienopyridine.  At this point I think we can probably close switching to aspirin and follow-up visit. ? ?No change in medications including current dose of beta-blocker, ARB, Thienopyridine and statin along with furosemide ?

## 2022-01-04 ENCOUNTER — Encounter: Payer: Self-pay | Admitting: Hematology & Oncology

## 2022-01-04 ENCOUNTER — Inpatient Hospital Stay: Payer: Medicare Other | Attending: Hematology & Oncology

## 2022-01-04 ENCOUNTER — Inpatient Hospital Stay (HOSPITAL_BASED_OUTPATIENT_CLINIC_OR_DEPARTMENT_OTHER): Payer: Medicare Other | Admitting: Hematology & Oncology

## 2022-01-04 ENCOUNTER — Other Ambulatory Visit: Payer: Self-pay

## 2022-01-04 VITALS — BP 109/62 | HR 77 | Temp 98.5°F | Resp 18 | Wt 200.0 lb

## 2022-01-04 DIAGNOSIS — D509 Iron deficiency anemia, unspecified: Secondary | ICD-10-CM | POA: Diagnosis not present

## 2022-01-04 DIAGNOSIS — G20 Parkinson's disease: Secondary | ICD-10-CM | POA: Insufficient documentation

## 2022-01-04 DIAGNOSIS — D500 Iron deficiency anemia secondary to blood loss (chronic): Secondary | ICD-10-CM | POA: Diagnosis not present

## 2022-01-04 DIAGNOSIS — Z79899 Other long term (current) drug therapy: Secondary | ICD-10-CM | POA: Insufficient documentation

## 2022-01-04 LAB — CBC WITH DIFFERENTIAL (CANCER CENTER ONLY)
Abs Immature Granulocytes: 0.02 10*3/uL (ref 0.00–0.07)
Basophils Absolute: 0 10*3/uL (ref 0.0–0.1)
Basophils Relative: 1 %
Eosinophils Absolute: 0.1 10*3/uL (ref 0.0–0.5)
Eosinophils Relative: 2 %
HCT: 37.7 % — ABNORMAL LOW (ref 39.0–52.0)
Hemoglobin: 12.4 g/dL — ABNORMAL LOW (ref 13.0–17.0)
Immature Granulocytes: 0 %
Lymphocytes Relative: 14 %
Lymphs Abs: 0.7 10*3/uL (ref 0.7–4.0)
MCH: 30.5 pg (ref 26.0–34.0)
MCHC: 32.9 g/dL (ref 30.0–36.0)
MCV: 92.6 fL (ref 80.0–100.0)
Monocytes Absolute: 0.5 10*3/uL (ref 0.1–1.0)
Monocytes Relative: 10 %
Neutro Abs: 3.8 10*3/uL (ref 1.7–7.7)
Neutrophils Relative %: 73 %
Platelet Count: 215 10*3/uL (ref 150–400)
RBC: 4.07 MIL/uL — ABNORMAL LOW (ref 4.22–5.81)
RDW: 13.4 % (ref 11.5–15.5)
WBC Count: 5.2 10*3/uL (ref 4.0–10.5)
nRBC: 0 % (ref 0.0–0.2)

## 2022-01-04 LAB — CMP (CANCER CENTER ONLY)
ALT: 8 U/L (ref 0–44)
AST: 14 U/L — ABNORMAL LOW (ref 15–41)
Albumin: 4 g/dL (ref 3.5–5.0)
Alkaline Phosphatase: 62 U/L (ref 38–126)
Anion gap: 7 (ref 5–15)
BUN: 11 mg/dL (ref 8–23)
CO2: 33 mmol/L — ABNORMAL HIGH (ref 22–32)
Calcium: 9.3 mg/dL (ref 8.9–10.3)
Chloride: 104 mmol/L (ref 98–111)
Creatinine: 1.09 mg/dL (ref 0.61–1.24)
GFR, Estimated: >60 mL/min (ref >60)
Glucose, Bld: 135 mg/dL — ABNORMAL HIGH (ref 70–99)
Potassium: 4.1 mmol/L (ref 3.5–5.1)
Sodium: 144 mmol/L (ref 135–145)
Total Bilirubin: 0.5 mg/dL (ref 0.3–1.2)
Total Protein: 6.8 g/dL (ref 6.5–8.1)

## 2022-01-04 LAB — FERRITIN: Ferritin: 483 ng/mL — ABNORMAL HIGH (ref 24–336)

## 2022-01-04 NOTE — Progress Notes (Signed)
?Hematology and Oncology Follow Up Visit ? ?Noah Scott ?[id number] ?[birth year] 86 y.o. ?01/04/2022 ? ? ?Principle Diagnosis:  ?Iron deficiency anemia ? ?Current Therapy:   ?IV iron given-Monoferric-on 08/17/2021 ?    ?Interim History:  Noah Scott is back for his follow-up.  He is doing pretty well.  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  He really enjoys this. ? ?His last iron studies back in February showed a ferritin of 703 with an iron saturation of 28%. ? ?He has had no problems with bleeding.  He has had no problems with bowels or bladder.  He has had no issues with nausea or vomiting. ? ?He does have Parkinson's.  He is on medication for this. ? ?He has had no issues with fever. ? ?He has had no problems with COVID. ? ?Overall, I would say his performance status is probably ECOG 2.   ? ?Medications:  ?Current Outpatient Medications:  ?  traMADol (ULTRAM) 50 MG tablet, Take 100 mg by mouth 2 (two) times daily as needed., Disp: , Rfl:  ?  Aromatic Inhalants (VICKS VAPOR INHALER IN), Inhale 1 Inhaler into the lungs as needed (stuffy nose)., Disp: , Rfl:  ?  doxazosin (CARDURA) 8 MG tablet, Take 8 mg by mouth daily., Disp: , Rfl:  ?  furosemide (LASIX) 20 MG tablet, Take 40 mg (two tablets) every other day, 20 mg (one tablet) on odd day. Make take an additonal 20 mg as needed daily for weight gain of 3 lbs overnight or increase swelling, Disp: 200 tablet, Rfl: 3 ?  ibuprofen (ADVIL) 600 MG tablet, Take 600 mg by mouth 3 (three) times daily., Disp: , Rfl:  ?  ibuprofen (ADVIL) 800 MG tablet, Take 800 mg by mouth as needed., Disp: , Rfl:  ?  lidocaine (XYLOCAINE) 1 % (with preservative) injection, by Infiltration route., Disp: , Rfl:  ?  losartan (COZAAR) 25 MG tablet, TAKE 1 TABLET(25 MG) BY MOUTH DAILY, Disp: 30 tablet, Rfl: 6 ?  metoprolol tartrate (LOPRESSOR) 25 MG tablet, Take 0.5 tablets (12.5 mg total) by mouth 2 (two) times daily. (Patient taking differently: Take 25 mg by mouth 2 (two) times  daily.), Disp: 30 tablet, Rfl: 3 ?  nitroGLYCERIN (NITROSTAT) 0.6 MG SL tablet, Place 0.6 mg under the tongue as needed., Disp: , Rfl:  ?  potassium chloride SA (KLOR-CON M20) 20 MEQ tablet, Take 1 tablet (20 mEq total) by mouth daily. (Patient taking differently: Take 10 mEq by mouth daily.), Disp: 90 tablet, Rfl: 3 ?  prasugrel (EFFIENT) 10 MG TABS tablet, TAKE 1 TABLET(10 MG) BY MOUTH DAILY, Disp: 90 tablet, Rfl: 2 ?  rosuvastatin (CRESTOR) 20 MG tablet, Take 1 tablet (20 mg total) by mouth daily., Disp: 90 tablet, Rfl: 3 ?  traMADol (ULTRAM) 50 MG tablet, Take by mouth every 6 (six) hours as needed., Disp: , Rfl:  ?  triamcinolone acetonide (TRIESENCE) 40 MG/ML SUSP, Inject into the articular space. (Patient not taking: Reported on 11/13/2021), Disp: , Rfl:  ? ?Allergies:  ?Allergies  ?Allergen Reactions  ? Clopidogrel Other (See Comments) and Swelling  ? Plavix [Clopidogrel Bisulfate] Swelling  ? Brilinta [Ticagrelor] Other (See Comments)  ?   Developed a cough  ? Enalapril   ? Other   ? ? ?Past Medical History, Surgical history, Social history, and Family History were reviewed and updated. ? ?Review of Systems: ?Review of Systems  ?Constitutional:  Positive for unexpected weight change.  ?HENT:  Negative.    ?  Eyes: Negative.   ?Respiratory: Negative.    ?Cardiovascular: Negative.   ?Gastrointestinal: Negative.   ?Endocrine: Negative.   ?Genitourinary: Negative.    ?Musculoskeletal: Negative.   ?Skin: Negative.   ?Neurological: Negative.   ?Hematological: Negative.   ?Psychiatric/Behavioral: Negative.    ? ?Physical Exam: ? weight is 200 lb (90.7 kg). His oral temperature is 98.5 ?F (36.9 ?C). His blood pressure is 109/62 and his pulse is 77. His respiration is 18 and oxygen saturation is 98%.  ? ?Wt Readings from Last 3 Encounters:  ?01/04/22 200 lb (90.7 kg)  ?11/13/21 201 lb 9.6 oz (91.4 kg)  ?10/16/21 200 lb (90.7 kg)  ? ? ?Physical Exam ?Vitals reviewed.  ?HENT:  ?   Head: Normocephalic and atraumatic.   ?Eyes:  ?   Pupils: Pupils are equal, round, and reactive to light.  ?Cardiovascular:  ?   Rate and Rhythm: Normal rate and regular rhythm.  ?   Heart sounds: Normal heart sounds.  ?Pulmonary:  ?   Effort: Pulmonary effort is normal.  ?   Breath sounds: Normal breath sounds.  ?Abdominal:  ?   General: Bowel sounds are normal.  ?   Palpations: Abdomen is soft.  ?Musculoskeletal:     ?   General: No tenderness or deformity. Normal range of motion.  ?   Cervical back: Normal range of motion.  ?Lymphadenopathy:  ?   Cervical: No cervical adenopathy.  ?Skin: ?   General: Skin is warm and dry.  ?   Findings: No erythema or rash.  ?Neurological:  ?   Mental Status: He is alert and oriented to person, place, and time.  ?Psychiatric:     ?   Behavior: Behavior normal.     ?   Thought Content: Thought content normal.     ?   Judgment: Judgment normal.  ? ? ? ?Lab Results  ?Component Value Date  ? WBC 5.2 01/04/2022  ? HGB 12.4 (L) 01/04/2022  ? HCT 37.7 (L) 01/04/2022  ? MCV 92.6 01/04/2022  ? PLT 215 01/04/2022  ? ?  Chemistry   ?   ?Component Value Date/Time  ? NA 144 01/04/2022 1134  ? NA 142 11/21/2021 0901  ? K 4.1 01/04/2022 1134  ? CL 104 01/04/2022 1134  ? CO2 33 (H) 01/04/2022 1134  ? BUN 11 01/04/2022 1134  ? BUN 12 11/21/2021 0901  ? CREATININE 1.09 01/04/2022 1134  ?    ?Component Value Date/Time  ? CALCIUM 9.3 01/04/2022 1134  ? ALKPHOS 62 01/04/2022 1134  ? AST 14 (L) 01/04/2022 1134  ? ALT 8 01/04/2022 1134  ? BILITOT 0.5 01/04/2022 1134  ?  ? ?Impression and Plan: ?Noah Scott is a very nice 86 year old white male.  He is originally from [place].  He is a lot of fun to talk to.  He is incredibly active for guy who is 86 years old.  I am very impressed by his remodeling abilities. ? ?We will see what his iron studies show. ? ?I think we can probably get him back now in 2 months or so.  I would like to see him back before July 4 holiday.   ? ?Volanda Napoleon, MD ?4/20/202312:17 PM  ?

## 2022-01-05 LAB — IRON AND IRON BINDING CAPACITY (CC-WL,HP ONLY)
Iron: 73 ug/dL (ref 45–182)
Saturation Ratios: 29 % (ref 17.9–39.5)
TIBC: 251 ug/dL (ref 250–450)
UIBC: 178 ug/dL (ref 117–376)

## 2022-03-19 ENCOUNTER — Inpatient Hospital Stay (HOSPITAL_BASED_OUTPATIENT_CLINIC_OR_DEPARTMENT_OTHER): Payer: Medicare Other | Admitting: Hematology & Oncology

## 2022-03-19 ENCOUNTER — Inpatient Hospital Stay: Payer: Medicare Other | Attending: Hematology & Oncology

## 2022-03-19 ENCOUNTER — Encounter: Payer: Self-pay | Admitting: Hematology & Oncology

## 2022-03-19 ENCOUNTER — Other Ambulatory Visit: Payer: Self-pay

## 2022-03-19 VITALS — BP 118/59 | HR 67 | Temp 98.2°F | Resp 19 | Ht 70.0 in | Wt 195.4 lb

## 2022-03-19 DIAGNOSIS — I214 Non-ST elevation (NSTEMI) myocardial infarction: Secondary | ICD-10-CM

## 2022-03-19 DIAGNOSIS — Z79899 Other long term (current) drug therapy: Secondary | ICD-10-CM | POA: Insufficient documentation

## 2022-03-19 DIAGNOSIS — D500 Iron deficiency anemia secondary to blood loss (chronic): Secondary | ICD-10-CM | POA: Diagnosis not present

## 2022-03-19 DIAGNOSIS — D509 Iron deficiency anemia, unspecified: Secondary | ICD-10-CM | POA: Insufficient documentation

## 2022-03-19 LAB — CBC WITH DIFFERENTIAL (CANCER CENTER ONLY)
Abs Immature Granulocytes: 0.03 10*3/uL (ref 0.00–0.07)
Basophils Absolute: 0 10*3/uL (ref 0.0–0.1)
Basophils Relative: 1 %
Eosinophils Absolute: 0.2 10*3/uL (ref 0.0–0.5)
Eosinophils Relative: 3 %
HCT: 36.7 % — ABNORMAL LOW (ref 39.0–52.0)
Hemoglobin: 11.8 g/dL — ABNORMAL LOW (ref 13.0–17.0)
Immature Granulocytes: 1 %
Lymphocytes Relative: 14 %
Lymphs Abs: 0.9 10*3/uL (ref 0.7–4.0)
MCH: 29.3 pg (ref 26.0–34.0)
MCHC: 32.2 g/dL (ref 30.0–36.0)
MCV: 91.1 fL (ref 80.0–100.0)
Monocytes Absolute: 0.8 10*3/uL (ref 0.1–1.0)
Monocytes Relative: 13 %
Neutro Abs: 4.1 10*3/uL (ref 1.7–7.7)
Neutrophils Relative %: 68 %
Platelet Count: 204 10*3/uL (ref 150–400)
RBC: 4.03 MIL/uL — ABNORMAL LOW (ref 4.22–5.81)
RDW: 13.2 % (ref 11.5–15.5)
WBC Count: 6 10*3/uL (ref 4.0–10.5)
nRBC: 0 % (ref 0.0–0.2)

## 2022-03-19 LAB — CMP (CANCER CENTER ONLY)
ALT: 7 U/L (ref 0–44)
AST: 12 U/L — ABNORMAL LOW (ref 15–41)
Albumin: 4.1 g/dL (ref 3.5–5.0)
Alkaline Phosphatase: 62 U/L (ref 38–126)
Anion gap: 8 (ref 5–15)
BUN: 14 mg/dL (ref 8–23)
CO2: 31 mmol/L (ref 22–32)
Calcium: 9.3 mg/dL (ref 8.9–10.3)
Chloride: 103 mmol/L (ref 98–111)
Creatinine: 1.02 mg/dL (ref 0.61–1.24)
GFR, Estimated: >60 mL/min (ref >60)
Glucose, Bld: 130 mg/dL — ABNORMAL HIGH (ref 70–99)
Potassium: 4.6 mmol/L (ref 3.5–5.1)
Sodium: 142 mmol/L (ref 135–145)
Total Bilirubin: 0.4 mg/dL (ref 0.3–1.2)
Total Protein: 7 g/dL (ref 6.5–8.1)

## 2022-03-19 LAB — FERRITIN: Ferritin: 644 ng/mL — ABNORMAL HIGH (ref 24–336)

## 2022-03-19 LAB — IRON AND IRON BINDING CAPACITY (CC-WL,HP ONLY)
Iron: 41 ug/dL — ABNORMAL LOW (ref 45–182)
Saturation Ratios: 19 % (ref 17.9–39.5)
TIBC: 213 ug/dL — ABNORMAL LOW (ref 250–450)
UIBC: 172 ug/dL (ref 117–376)

## 2022-03-19 LAB — RETICULOCYTES
Immature Retic Fract: 11.5 % (ref 2.3–15.9)
RBC.: 4.05 MIL/uL — ABNORMAL LOW (ref 4.22–5.81)
Retic Count, Absolute: 37.7 10*3/uL (ref 19.0–186.0)
Retic Ct Pct: 0.9 % (ref 0.4–3.1)

## 2022-03-19 NOTE — Progress Notes (Signed)
Hematology and Oncology Follow Up Visit  Noah Scott [id number] 11-Feb-1936 86 y.o. 03/19/2022   Principle Diagnosis:  Iron deficiency anemia  Current Therapy:   IV iron given-Monoferric-on 08/17/2021     Interim History:  Noah Scott is back for his follow-up.  Overall, he is holding his own pretty well.  He has been doing nicely.  We last saw him back in April.  Since then, he has been having with some remodeling at his daughter's house.  When we last saw him in April, the iron studies showed a ferritin of 43 with an iron saturation of 29%.  There is been no problems with pain.  He says he may have some dementia.  He says been checked for this.  He has had no change in bowel or bladder habits.  He has had no cough or shortness of breath.  He has had no fever.  There is been no rashes.  On occasion, he has some leg swelling.  Overall, I was his performance status is probably ECOG 1.      Medications:  Current Outpatient Medications:    Aromatic Inhalants (VICKS VAPOR INHALER IN), Inhale 1 Inhaler into the lungs as needed (stuffy nose)., Disp: , Rfl:    doxazosin (CARDURA) 8 MG tablet, Take 8 mg by mouth daily., Disp: , Rfl:    furosemide (LASIX) 20 MG tablet, Take 40 mg (two tablets) every other day, 20 mg (one tablet) on odd day. Make take an additonal 20 mg as needed daily for weight gain of 3 lbs overnight or increase swelling, Disp: 200 tablet, Rfl: 3   ibuprofen (ADVIL) 800 MG tablet, Take 800 mg by mouth as needed., Disp: , Rfl:    lidocaine (XYLOCAINE) 1 % (with preservative) injection, by Infiltration route., Disp: , Rfl:    losartan (COZAAR) 25 MG tablet, TAKE 1 TABLET(25 MG) BY MOUTH DAILY, Disp: 30 tablet, Rfl: 6   metoprolol tartrate (LOPRESSOR) 25 MG tablet, Take 0.5 tablets (12.5 mg total) by mouth 2 (two) times daily. (Patient taking differently: Take 25 mg by mouth 2 (two) times daily.), Disp: 30 tablet, Rfl: 3   prasugrel (EFFIENT) 10 MG TABS tablet, TAKE 1 TABLET(10 MG)  BY MOUTH DAILY, Disp: 90 tablet, Rfl: 2   traMADol (ULTRAM) 50 MG tablet, Take by mouth every 6 (six) hours as needed., Disp: , Rfl:    triamcinolone acetonide (TRIESENCE) 40 MG/ML SUSP, Inject into the articular space., Disp: , Rfl:    nitroGLYCERIN (NITROSTAT) 0.6 MG SL tablet, Place 0.6 mg under the tongue as needed. (Patient not taking: Reported on 03/19/2022), Disp: , Rfl:    potassium chloride SA (KLOR-CON M20) 20 MEQ tablet, Take 1 tablet (20 mEq total) by mouth daily. (Patient taking differently: Take 10 mEq by mouth daily.), Disp: 90 tablet, Rfl: 3   rosuvastatin (CRESTOR) 20 MG tablet, Take 1 tablet (20 mg total) by mouth daily., Disp: 90 tablet, Rfl: 3  Allergies:  Allergies  Allergen Reactions   Plavix [Clopidogrel Bisulfate] Swelling   Brilinta [Ticagrelor] Cough   Enalapril Other (See Comments)    Past Medical History, Surgical history, Social history, and Family History were reviewed and updated.  Review of Systems: Review of Systems  Constitutional:  Positive for unexpected weight change.  HENT:  Negative.    Eyes: Negative.   Respiratory: Negative.    Cardiovascular: Negative.   Gastrointestinal: Negative.   Endocrine: Negative.   Genitourinary: Negative.    Musculoskeletal: Negative.   Skin: Negative.  Neurological: Negative.   Hematological: Negative.   Psychiatric/Behavioral: Negative.      Physical Exam:  height is '5\' 10"'$  (1.778 m) and weight is 195 lb 6.4 oz (88.6 kg). His oral temperature is 98.2 F (36.8 C). His blood pressure is 118/59 (abnormal) and his pulse is 67. His respiration is 19 and oxygen saturation is 98%.   Wt Readings from Last 3 Encounters:  03/19/22 195 lb 6.4 oz (88.6 kg)  01/04/22 200 lb (90.7 kg)  11/13/21 201 lb 9.6 oz (91.4 kg)    Physical Exam Vitals reviewed.  HENT:     Head: Normocephalic and atraumatic.  Eyes:     Pupils: Pupils are equal, round, and reactive to light.  Cardiovascular:     Rate and Rhythm: Normal rate  and regular rhythm.     Heart sounds: Normal heart sounds.  Pulmonary:     Effort: Pulmonary effort is normal.     Breath sounds: Normal breath sounds.  Abdominal:     General: Bowel sounds are normal.     Palpations: Abdomen is soft.  Musculoskeletal:        General: No tenderness or deformity. Normal range of motion.     Cervical back: Normal range of motion.  Lymphadenopathy:     Cervical: No cervical adenopathy.  Skin:    General: Skin is warm and dry.     Findings: No erythema or rash.  Neurological:     Mental Status: He is alert and oriented to person, place, and time.  Psychiatric:        Behavior: Behavior normal.        Thought Content: Thought content normal.        Judgment: Judgment normal.     Lab Results  Component Value Date   WBC 6.0 03/19/2022   HGB 11.8 (L) 03/19/2022   HCT 36.7 (L) 03/19/2022   MCV 91.1 03/19/2022   PLT 204 03/19/2022     Chemistry      Component Value Date/Time   NA 144 01/04/2022 1134   NA 142 11/21/2021 0901   K 4.1 01/04/2022 1134   CL 104 01/04/2022 1134   CO2 33 (H) 01/04/2022 1134   BUN 11 01/04/2022 1134   BUN 12 11/21/2021 0901   CREATININE 1.09 01/04/2022 1134      Component Value Date/Time   CALCIUM 9.3 01/04/2022 1134   ALKPHOS 62 01/04/2022 1134   AST 14 (L) 01/04/2022 1134   ALT 8 01/04/2022 1134   BILITOT 0.5 01/04/2022 1134     Impression and Plan: Noah Scott is a very nice 86 year old white male.  We will have to see what his iron studies look like.  Cabometyx that his last erythropoietin level back in December was 16.  He seems to be doing pretty well.  His hemoglobin is down a little bit.  However, I would be surprised if we had to give him any iron.  We will plan to get him back after Labor Day now.    Volanda Napoleon, MD 7/3/202312:03 PM

## 2022-03-21 ENCOUNTER — Telehealth: Payer: Self-pay

## 2022-03-21 NOTE — Telephone Encounter (Signed)
-----   Message from Volanda Napoleon, MD sent at 03/19/2022  4:52 PM EDT ----- Please call and let him know that the iron is on the lower side.  Please set him up with a dose of IV iron.  Thanks.  [name]

## 2022-03-21 NOTE — Telephone Encounter (Signed)
Called and informed patient of lab results, patient verbalized understanding and denies any questions or concerns at this time. Message sent to scheduling to contact pt and schedule iron apt.

## 2022-03-28 ENCOUNTER — Ambulatory Visit: Payer: Medicare Other

## 2022-04-02 ENCOUNTER — Inpatient Hospital Stay: Payer: Medicare Other

## 2022-04-02 VITALS — BP 126/74 | HR 60 | Temp 98.2°F | Resp 18

## 2022-04-02 DIAGNOSIS — K909 Intestinal malabsorption, unspecified: Secondary | ICD-10-CM

## 2022-04-02 DIAGNOSIS — D509 Iron deficiency anemia, unspecified: Secondary | ICD-10-CM | POA: Diagnosis not present

## 2022-04-02 DIAGNOSIS — D500 Iron deficiency anemia secondary to blood loss (chronic): Secondary | ICD-10-CM

## 2022-04-02 MED ORDER — SODIUM CHLORIDE 0.9 % IV SOLN
1000.0000 mg | Freq: Once | INTRAVENOUS | Status: AC
  Administered 2022-04-02: 1000 mg via INTRAVENOUS
  Filled 2022-04-02: qty 10

## 2022-04-02 MED ORDER — SODIUM CHLORIDE 0.9 % IV SOLN: Freq: Once | INTRAVENOUS | Status: DC | PRN

## 2022-04-02 NOTE — Patient Instructions (Signed)

## 2022-06-04 ENCOUNTER — Other Ambulatory Visit: Payer: Self-pay

## 2022-06-04 ENCOUNTER — Inpatient Hospital Stay: Payer: Medicare Other | Attending: Hematology & Oncology

## 2022-06-04 ENCOUNTER — Encounter: Payer: Self-pay | Admitting: Hematology & Oncology

## 2022-06-04 ENCOUNTER — Inpatient Hospital Stay (HOSPITAL_BASED_OUTPATIENT_CLINIC_OR_DEPARTMENT_OTHER): Payer: Medicare Other | Admitting: Hematology & Oncology

## 2022-06-04 VITALS — BP 113/64 | HR 63 | Temp 98.1°F | Resp 18 | Ht 70.0 in | Wt 195.0 lb

## 2022-06-04 DIAGNOSIS — D500 Iron deficiency anemia secondary to blood loss (chronic): Secondary | ICD-10-CM

## 2022-06-04 DIAGNOSIS — D509 Iron deficiency anemia, unspecified: Secondary | ICD-10-CM | POA: Diagnosis present

## 2022-06-04 DIAGNOSIS — I214 Non-ST elevation (NSTEMI) myocardial infarction: Secondary | ICD-10-CM

## 2022-06-04 LAB — CMP (CANCER CENTER ONLY)
ALT: 7 U/L (ref 0–44)
AST: 13 U/L — ABNORMAL LOW (ref 15–41)
Albumin: 3.9 g/dL (ref 3.5–5.0)
Alkaline Phosphatase: 56 U/L (ref 38–126)
Anion gap: 6 (ref 5–15)
BUN: 24 mg/dL — ABNORMAL HIGH (ref 8–23)
CO2: 31 mmol/L (ref 22–32)
Calcium: 8.9 mg/dL (ref 8.9–10.3)
Chloride: 107 mmol/L (ref 98–111)
Creatinine: 1.33 mg/dL — ABNORMAL HIGH (ref 0.61–1.24)
GFR, Estimated: 53 mL/min — ABNORMAL LOW (ref >60)
Glucose, Bld: 125 mg/dL — ABNORMAL HIGH (ref 70–99)
Potassium: 3.9 mmol/L (ref 3.5–5.1)
Sodium: 144 mmol/L (ref 135–145)
Total Bilirubin: 0.3 mg/dL (ref 0.3–1.2)
Total Protein: 6.6 g/dL (ref 6.5–8.1)

## 2022-06-04 LAB — CBC WITH DIFFERENTIAL (CANCER CENTER ONLY)
Abs Immature Granulocytes: 0.06 10*3/uL (ref 0.00–0.07)
Basophils Absolute: 0 10*3/uL (ref 0.0–0.1)
Basophils Relative: 1 %
Eosinophils Absolute: 0.2 10*3/uL (ref 0.0–0.5)
Eosinophils Relative: 3 %
HCT: 34.7 % — ABNORMAL LOW (ref 39.0–52.0)
Hemoglobin: 11.2 g/dL — ABNORMAL LOW (ref 13.0–17.0)
Immature Granulocytes: 1 %
Lymphocytes Relative: 19 %
Lymphs Abs: 1 10*3/uL (ref 0.7–4.0)
MCH: 30.1 pg (ref 26.0–34.0)
MCHC: 32.3 g/dL (ref 30.0–36.0)
MCV: 93.3 fL (ref 80.0–100.0)
Monocytes Absolute: 0.6 10*3/uL (ref 0.1–1.0)
Monocytes Relative: 12 %
Neutro Abs: 3.2 10*3/uL (ref 1.7–7.7)
Neutrophils Relative %: 64 %
Platelet Count: 150 10*3/uL (ref 150–400)
RBC: 3.72 MIL/uL — ABNORMAL LOW (ref 4.22–5.81)
RDW: 15.3 % (ref 11.5–15.5)
WBC Count: 5 10*3/uL (ref 4.0–10.5)
nRBC: 0 % (ref 0.0–0.2)

## 2022-06-04 LAB — SAVE SMEAR(SSMR), FOR PROVIDER SLIDE REVIEW

## 2022-06-04 LAB — RETICULOCYTES
Immature Retic Fract: 7.6 % (ref 2.3–15.9)
RBC.: 3.69 MIL/uL — ABNORMAL LOW (ref 4.22–5.81)
Retic Count, Absolute: 38.4 10*3/uL (ref 19.0–186.0)
Retic Ct Pct: 1 % (ref 0.4–3.1)

## 2022-06-04 LAB — IRON AND IRON BINDING CAPACITY (CC-WL,HP ONLY)
Iron: 78 ug/dL (ref 45–182)
Saturation Ratios: 35 % (ref 17.9–39.5)
TIBC: 223 ug/dL — ABNORMAL LOW (ref 250–450)
UIBC: 145 ug/dL (ref 117–376)

## 2022-06-04 NOTE — Progress Notes (Signed)
Hematology and Oncology Follow Up Visit  Noah Scott [id number] Sep 18, 1935 86 y.o. 06/04/2022   Principle Diagnosis:  Iron deficiency anemia  Current Therapy:   IV iron given-Monoferric-on  04/02/2022     Interim History:  Noah Scott is back for his follow-up.  As always, he is quite busy doing remodeling.  He is doing I think I am granddaughters bathroom.  He loves doing this.  This is sort of what he did when he was up in [place].  We did give him some iron back in July.  At that time, his ferritin was 644 with an iron saturation of 19%.  He feels okay.  He has had no bleeding.  There is been no change in bowel or bladder habits.  He has had no cough or shortness of breath.  His appetite has been okay.  He has had no leg swelling.  He has had no rashes.  Thankfully, there is been no problems with COVID.  Overall, I would say his performance status is probably ECOG 1.   Medications:  Current Outpatient Medications:    Aromatic Inhalants (VICKS VAPOR INHALER IN), Inhale 1 Inhaler into the lungs as needed (stuffy nose)., Disp: , Rfl:    doxazosin (CARDURA) 8 MG tablet, Take 8 mg by mouth daily., Disp: , Rfl:    furosemide (LASIX) 20 MG tablet, Take 40 mg (two tablets) every other day, 20 mg (one tablet) on odd day. Make take an additonal 20 mg as needed daily for weight gain of 3 lbs overnight or increase swelling, Disp: 200 tablet, Rfl: 3   ibuprofen (ADVIL) 800 MG tablet, Take 800 mg by mouth as needed., Disp: , Rfl:    lidocaine (XYLOCAINE) 1 % (with preservative) injection, by Infiltration route., Disp: , Rfl:    losartan (COZAAR) 50 MG tablet, Take 50 mg by mouth daily., Disp: , Rfl:    metoprolol tartrate (LOPRESSOR) 25 MG tablet, Take 0.5 tablets (12.5 mg total) by mouth 2 (two) times daily. (Patient taking differently: Take 25 mg by mouth 2 (two) times daily.), Disp: 30 tablet, Rfl: 3   nitroGLYCERIN (NITROSTAT) 0.6 MG SL tablet, Place 0.6 mg under the tongue as needed.  (Patient not taking: Reported on 03/19/2022), Disp: , Rfl:    potassium chloride SA (KLOR-CON M20) 20 MEQ tablet, Take 1 tablet (20 mEq total) by mouth daily. (Patient taking differently: Take 10 mEq by mouth daily.), Disp: 90 tablet, Rfl: 3   prasugrel (EFFIENT) 10 MG TABS tablet, TAKE 1 TABLET(10 MG) BY MOUTH DAILY, Disp: 90 tablet, Rfl: 2   rosuvastatin (CRESTOR) 20 MG tablet, Take 1 tablet (20 mg total) by mouth daily., Disp: 90 tablet, Rfl: 3   traMADol (ULTRAM) 50 MG tablet, Take by mouth every 6 (six) hours as needed., Disp: , Rfl:    triamcinolone acetonide (TRIESENCE) 40 MG/ML SUSP, Inject into the articular space., Disp: , Rfl:   Allergies:  Allergies  Allergen Reactions   Plavix [Clopidogrel Bisulfate] Swelling   Brilinta [Ticagrelor] Cough   Enalapril Other (See Comments)    Past Medical History, Surgical history, Social history, and Family History were reviewed and updated.  Review of Systems: Review of Systems  Constitutional:  Positive for unexpected weight change.  HENT:  Negative.    Eyes: Negative.   Respiratory: Negative.    Cardiovascular: Negative.   Gastrointestinal: Negative.   Endocrine: Negative.   Genitourinary: Negative.    Musculoskeletal: Negative.   Skin: Negative.   Neurological: Negative.  Hematological: Negative.   Psychiatric/Behavioral: Negative.      Physical Exam:  height is '5\' 10"'$  (1.778 m) and weight is 195 lb (88.5 kg). His oral temperature is 98.1 F (36.7 C). His blood pressure is 113/64 and his pulse is 63. His respiration is 18 and oxygen saturation is 100%.   Wt Readings from Last 3 Encounters:  06/04/22 195 lb (88.5 kg)  03/19/22 195 lb 6.4 oz (88.6 kg)  01/04/22 200 lb (90.7 kg)    Physical Exam Vitals reviewed.  HENT:     Head: Normocephalic and atraumatic.  Eyes:     Pupils: Pupils are equal, round, and reactive to light.  Cardiovascular:     Rate and Rhythm: Normal rate and regular rhythm.     Heart sounds: Normal  heart sounds.  Pulmonary:     Effort: Pulmonary effort is normal.     Breath sounds: Normal breath sounds.  Abdominal:     General: Bowel sounds are normal.     Palpations: Abdomen is soft.  Musculoskeletal:        General: No tenderness or deformity. Normal range of motion.     Cervical back: Normal range of motion.  Lymphadenopathy:     Cervical: No cervical adenopathy.  Skin:    General: Skin is warm and dry.     Findings: No erythema or rash.  Neurological:     Mental Status: He is alert and oriented to person, place, and time.  Psychiatric:        Behavior: Behavior normal.        Thought Content: Thought content normal.        Judgment: Judgment normal.     Lab Results  Component Value Date   WBC 5.0 06/04/2022   HGB 11.2 (L) 06/04/2022   HCT 34.7 (L) 06/04/2022   MCV 93.3 06/04/2022   PLT 150 06/04/2022     Chemistry      Component Value Date/Time   NA 144 06/04/2022 1137   NA 142 11/21/2021 0901   K 3.9 06/04/2022 1137   CL 107 06/04/2022 1137   CO2 31 06/04/2022 1137   BUN 24 (H) 06/04/2022 1137   BUN 12 11/21/2021 0901   CREATININE 1.33 (H) 06/04/2022 1137      Component Value Date/Time   CALCIUM 8.9 06/04/2022 1137   ALKPHOS 56 06/04/2022 1137   AST 13 (L) 06/04/2022 1137   ALT 7 06/04/2022 1137   BILITOT 0.3 06/04/2022 1137     Impression and Plan: Noah Scott is a very nice 86 year old white male.  He has recurrent iron deficiency anemia.  He will have his iron results back.  Of note, he also has an element of erythropoietin deficiency.  When we checked his erythropoietin back in 2022, the level was only 16.  We will still follow him closely.  I would like to see him back in about 3 months.  This will be about the holiday season.     Volanda Napoleon, MD 9/18/202312:34 PM

## 2022-06-05 ENCOUNTER — Telehealth: Payer: Self-pay

## 2022-06-05 LAB — ERYTHROPOIETIN: Erythropoietin: 7.4 m[IU]/mL (ref 2.6–18.5)

## 2022-06-05 LAB — FERRITIN: Ferritin: 645 ng/mL — ABNORMAL HIGH (ref 24–336)

## 2022-06-05 NOTE — Telephone Encounter (Signed)
-----   Message from Volanda Napoleon, MD sent at 06/04/2022  5:25 PM EDT ----- Please call let him know that the iron studies look okay.  Thanks.  [name]

## 2022-06-05 NOTE — Telephone Encounter (Signed)
Called and informed patient of lab results, patient verbalized understanding and denies any questions or concerns at this time.   

## 2022-06-18 ENCOUNTER — Other Ambulatory Visit: Payer: Self-pay | Admitting: Cardiology

## 2022-07-27 ENCOUNTER — Encounter: Payer: Self-pay | Admitting: Physical Medicine and Rehabilitation

## 2022-08-13 ENCOUNTER — Ambulatory Visit (INDEPENDENT_AMBULATORY_CARE_PROVIDER_SITE_OTHER): Payer: Medicare Other | Admitting: Medical

## 2022-08-13 VITALS — BP 116/70 | HR 66 | Temp 98.0°F | Resp 18 | Ht 70.0 in | Wt 205.0 lb

## 2022-08-13 DIAGNOSIS — I10 Essential (primary) hypertension: Secondary | ICD-10-CM

## 2022-08-13 DIAGNOSIS — Z9861 Coronary angioplasty status: Secondary | ICD-10-CM

## 2022-08-13 DIAGNOSIS — M549 Dorsalgia, unspecified: Secondary | ICD-10-CM

## 2022-08-13 DIAGNOSIS — G4700 Insomnia, unspecified: Secondary | ICD-10-CM

## 2022-08-13 DIAGNOSIS — E785 Hyperlipidemia, unspecified: Secondary | ICD-10-CM

## 2022-08-13 DIAGNOSIS — I2510 Atherosclerotic heart disease of native coronary artery without angina pectoris: Secondary | ICD-10-CM | POA: Diagnosis not present

## 2022-08-13 DIAGNOSIS — G8929 Other chronic pain: Secondary | ICD-10-CM

## 2022-08-13 DIAGNOSIS — R1909 Other intra-abdominal and pelvic swelling, mass and lump: Secondary | ICD-10-CM

## 2022-08-13 NOTE — Progress Notes (Signed)
Subjective:    Patient ID: Noah Scott, male    DOB: 1936/08/05, 86 y.o.   MRN: [id number]  HPI Pt in for first time.  Former Engineer, petroleum.  Pt states originally from [place]. His grandaughter graduated from [employer]. His daughter lives in area as well.  Pt has high cholesterol, htn, bph, insomnia, CAD and chronic back pain. Hx of low back surgery as well.  Pt seeing pain management. New appt January 22nd.    Prior mri in 2019   IMPRESSION: 1. Multilevel spondylosis of the lumbar spine as described. 2. Mild right foraminal narrowing at T12-L1. 3. Mild subarticular and foraminal narrowing bilaterally at L1-2. 4. Moderate left and mild right subarticular narrowing at L2-3 with moderate foraminal stenosis bilaterally, worse on the left. 5. Mild subarticular and moderate bilateral foraminal stenosis at L3-4. 6. Moderate right and mild left subarticular narrowing with moderate foraminal narrowing bilaterally at L4-5. 7. Moderate right and mild left subarticular narrowing with mild foraminal narrowing bilaterally at L5-S1.   3 months ago felt large hernia come up using bathroom. No pain, no fever, no chills. Normal appetttie.  Review of Systems  Constitutional:  Negative for chills, fatigue and fever.  HENT:  Negative for congestion and ear pain.   Respiratory:  Negative for cough, chest tightness, shortness of breath and wheezing.   Cardiovascular:  Negative for chest pain and palpitations.  Gastrointestinal:  Negative for abdominal pain, blood in stool, nausea and vomiting.  Genitourinary:  Positive for dysuria, frequency and hematuria.  Musculoskeletal:  Positive for back pain.    Past Medical History:  Diagnosis Date   CAD S/P percutaneous coronary angioplasty 2001   a) 2001 ([place]): PCI OM1 - now with ~65% ISR.;; b) NSTEMI 11/2017 - ost-prox RCA 75-65% (Overlapping ONYX DES 3.0 x 26 & 3.0 x 8 --> 3.5-3.3 mm). m-dRCA 95% (ONYX DES 2.75 x  18 mm -> 3.0 mm). Ost OM1 65% ISR (med Rx). dRCA-OstRDA (50%-25%)   Essential (primary) hypertension 11/21/2017   Hyperlipidemia 11/21/2017   Iron deficiency anemia due to chronic blood loss 08/08/2021   Iron malabsorption 08/08/2021   Neurogenic claudication due to lumbar spinal stenosis 02/06/2018   NSTEMI (non-ST elevated myocardial infarction) ([place]) 11/21/2017   Multiple RCA lesions - 2 Overlapping DES Ost-Prox RCA & 1 in mid-distal RCA   Spinal stenosis of lumbar region    Venous reflux 11/2020   Venous Dopplers 12/15/2020: No DVT or superficial venous thrombosis bilaterally.  Venous reflux noted in bilateral FV and saphenofemoral junctions, R GSV in the thigh and R SFV, left proximal thigh GSV. -->  No invasive options due to deep and superficial venous reflux.     Social History   Socioeconomic History   Marital status: Widowed    Spouse name: Not on file   Number of children: 1   Years of education: 7   Highest education level: Not on file  Occupational History   Occupation: retired Geophysical data processor  Tobacco Use   Smoking status: Former    Types: Cigarettes    Quit date: 03/02/1969    Years since quitting: 53.4   Smokeless tobacco: Never  Vaping Use   Vaping Use: Never used  Substance and Sexual Activity   Alcohol use: Not Currently   Drug use: No   Sexual activity: Not on file  Other Topics Concern   Not on file  Social History Narrative   Lives alone in an apartment on the first floor.  Has one daughter.  Retired Geophysical data processor.  Education: some college.    Social Determinants of Health   Financial Resource Strain: Not on file  Food Insecurity: Not on file  Transportation Needs: Not on file  Physical Activity: Not on file  Stress: Not on file  Social Connections: Not on file  Intimate Partner Violence: Not on file    Past Surgical History:  Procedure Laterality Date   CORONARY STENT INTERVENTION N/A 11/25/2017   Procedure: CORONARY STENT  INTERVENTION;  Surgeon: Leonie Man, MD;  Location: [facility];  Service: Cardiovascular:   m-dRCA 95%  (DES PCI - 0%.  Resolute ONYX DES 2.75 x 18 mm - ~3.0 mm). Ost-proxRCA 75-65% (DES PCI-0%. 2 overlapping Resolute ONYX DES 3.0 x 26 & 3.0 x 8 mm --> 3.5-3.3 mm)   CORONARY STENT INTERVENTION  2001   ([place]): PCI OM1   LEA DOPPLERS  12/12/2017   Normal bilateral ABIs and TBI's.   LEFT HEART CATH AND CORONARY ANGIOGRAPHY N/A 11/25/2017   Procedure: LEFT HEART CATH AND CORONARY ANGIOGRAPHY;  Surgeon: Leonie Man, MD;  Location: [facility];  Service: Cardiovascular:  Ost-proxRCA 75&65%(DES PCI), m-dRCA 95% (DES PCI), dRCA 50%-25% ost rPDA. ostOM1 65% ISR (from 2001).    LUMBAR LAMINECTOMY/DECOMPRESSION MICRODISCECTOMY Bilateral 06/13/2018   Procedure: Laminectomy and Foraminotomy - L1-L2 - L2-L3 - L3-L4 - bilateral;  Surgeon: Eustace Moore, MD;  Location: [facility];  Service: Neurosurgery;  Laterality: Bilateral;   TRANSTHORACIC ECHOCARDIOGRAM  11/23/2017   a) 11/2017: Nl LV size & Fxn. EF 60 - 65%.  No RWMA.  GR 1 DD.  Mild RV dilation.; b) 05/2020:([facility]): Normal LV size and function.  EF 60 and 65%.  Normal RV size and function.  Aortic sclerosis with no stenosis.  No AI.  Mildly dilated ascending aorta.  CVP ~5 mm.   TRANSTHORACIC ECHOCARDIOGRAM  03/31/2021   EF 55 to 60%.  GR 1 DD.  Ascending aorta measured at 41 mm-moderate dilation.  Recommended follow-up with imaging study in 6 months to reassess.    Family History  Problem Relation Age of Onset   Heart disease Father        Pt does not know details   Stroke Neg Hx    Diabetes Neg Hx     Allergies  Allergen Reactions   Plavix [Clopidogrel Bisulfate] Swelling   Brilinta [Ticagrelor] Cough   Enalapril Other (See Comments)    Current Outpatient Medications on File Prior to Visit  Medication Sig Dispense Refill   Aromatic Inhalants (VICKS VAPOR INHALER IN) Inhale 1 Inhaler into the lungs as needed (stuffy  nose).     cyanocobalamin (VITAMIN B12) 1000 MCG tablet Take 1,000 mcg by mouth daily.     docusate sodium (COLACE) 100 MG capsule Take 100 mg by mouth 2 (two) times daily.     doxazosin (CARDURA) 8 MG tablet Take 8 mg by mouth daily.     furosemide (LASIX) 20 MG tablet TAKE 2 TABLET EVERY OTHER DAY AND 1 TABLET ON ODD DAYS. MAY TAKE AN ADDITIONAL TABLET FOR WEIGHT GAIN OF 3LBS OVERNIGHT OR INCREASED SWELLING 200 tablet 3   ibuprofen (ADVIL) 800 MG tablet Take 800 mg by mouth as needed.     lidocaine (XYLOCAINE) 1 % (with preservative) injection by Infiltration route.     losartan (COZAAR) 50 MG tablet Take 50 mg by mouth daily.     magnesium oxide (MAG-OX) 400 (240 Mg) MG tablet Take  200 mg by mouth daily.     metoprolol tartrate (LOPRESSOR) 25 MG tablet Take 0.5 tablets (12.5 mg total) by mouth 2 (two) times daily. (Patient taking differently: Take 25 mg by mouth 2 (two) times daily.) 30 tablet 3   nitroGLYCERIN (NITROSTAT) 0.6 MG SL tablet Place 0.6 mg under the tongue as needed.     prasugrel (EFFIENT) 10 MG TABS tablet TAKE 1 TABLET(10 MG) BY MOUTH DAILY 90 tablet 2   traMADol (ULTRAM) 50 MG tablet Take by mouth every 6 (six) hours as needed.     traZODone (DESYREL) 50 MG tablet Take 50 mg by mouth at bedtime.     triamcinolone acetonide (TRIESENCE) 40 MG/ML SUSP Inject into the articular space.     potassium chloride SA (KLOR-CON M20) 20 MEQ tablet Take 1 tablet (20 mEq total) by mouth daily. (Patient taking differently: Take 10 mEq by mouth daily.) 90 tablet 3   rosuvastatin (CRESTOR) 20 MG tablet Take 1 tablet (20 mg total) by mouth daily. 90 tablet 3   No current facility-administered medications on file prior to visit.    BP 116/70   Pulse 66   Temp 98 F (36.7 C)   Resp 18   Ht '5\' 10"'$  (1.778 m)   Wt 205 lb (93 kg)   SpO2 98%   BMI 29.41 kg/m        Objective:   Physical Exam   General Mental Status- Alert. General Appearance- Not in acute distress.   Skin General:  Color- Normal Color. Moisture- Normal Moisture.  Neck Carotid Arteries- Normal color. Moisture- Normal Moisture. No carotid bruits. No JVD.  Chest and Lung Exam Auscultation: Breath Sounds:-Normal.  Cardiovascular Auscultation:Rythm- Regular. Murmurs & Other Heart Sounds:Auscultation of the heart reveals- No Murmurs.  Abdomen Inspection:-Inspeection Normal. Palpation/Percussion:Note:No mass. Palpation and Percussion of the abdomen reveal- Non Tender, Non Distended + BS, no rebound or guarding.   Neurologic Cranial Nerve exam:- CN III-XII intact(No nystagmus), symmetric smile. Strength:- 5/5 equal and symmetric strength both upper and lower extremities.       Assessment & Plan:   Patient Instructions  Htn- well controlled. Continue losartan, metoprolol and Lasix.  CAD with stent hx- continue to follow up with Dr Ellyn Hack.  Continue current blood thinner.  For insomnia continue trazodone.  For hyperlipidemia continue Crestor.  For chronic back pain continue with pain management.  You might ask your upcoming new pain management provider whether or not Lyrica might be an option with of low-dose narcotic.  They might switch you off of tramadol.  Follow-up with me in 3 months or sooner if needed      [employer], PA-C

## 2022-08-13 NOTE — Patient Instructions (Addendum)
Htn- well controlled. Continue losartan, metoprolol and Lasix.  CAD with stent hx- continue to follow up with Dr Ellyn Hack.  Continue current blood thinner.  For insomnia continue trazodone.  For hyperlipidemia continue Crestor.  For chronic back pain continue with pain management.  You might ask your upcoming new pain management provider whether or not Lyrica might be an option with of low-dose narcotic.  They might switch you off of tramadol.  Give advance notice on med refill request.  Follow-up with me in 3 months or sooner if needed

## 2022-08-15 ENCOUNTER — Encounter: Payer: Medicare Other | Admitting: Physical Medicine and Rehabilitation

## 2022-09-03 ENCOUNTER — Inpatient Hospital Stay (HOSPITAL_BASED_OUTPATIENT_CLINIC_OR_DEPARTMENT_OTHER): Payer: Medicare Other | Admitting: Hematology & Oncology

## 2022-09-03 ENCOUNTER — Inpatient Hospital Stay: Payer: Medicare Other | Attending: Hematology & Oncology

## 2022-09-03 ENCOUNTER — Other Ambulatory Visit: Payer: Self-pay

## 2022-09-03 ENCOUNTER — Encounter: Payer: Self-pay | Admitting: Hematology & Oncology

## 2022-09-03 VITALS — BP 104/64 | HR 76 | Temp 98.7°F | Resp 19 | Ht 70.0 in | Wt 208.0 lb

## 2022-09-03 DIAGNOSIS — M549 Dorsalgia, unspecified: Secondary | ICD-10-CM | POA: Insufficient documentation

## 2022-09-03 DIAGNOSIS — D500 Iron deficiency anemia secondary to blood loss (chronic): Secondary | ICD-10-CM

## 2022-09-03 DIAGNOSIS — G8929 Other chronic pain: Secondary | ICD-10-CM | POA: Diagnosis not present

## 2022-09-03 DIAGNOSIS — D509 Iron deficiency anemia, unspecified: Secondary | ICD-10-CM | POA: Insufficient documentation

## 2022-09-03 DIAGNOSIS — Z79899 Other long term (current) drug therapy: Secondary | ICD-10-CM | POA: Diagnosis not present

## 2022-09-03 LAB — CBC WITH DIFFERENTIAL (CANCER CENTER ONLY)
Abs Immature Granulocytes: 0.04 10*3/uL (ref 0.00–0.07)
Basophils Absolute: 0 10*3/uL (ref 0.0–0.1)
Basophils Relative: 1 %
Eosinophils Absolute: 0.1 10*3/uL (ref 0.0–0.5)
Eosinophils Relative: 2 %
HCT: 38.4 % — ABNORMAL LOW (ref 39.0–52.0)
Hemoglobin: 12.5 g/dL — ABNORMAL LOW (ref 13.0–17.0)
Immature Granulocytes: 1 %
Lymphocytes Relative: 17 %
Lymphs Abs: 0.9 10*3/uL (ref 0.7–4.0)
MCH: 31 pg (ref 26.0–34.0)
MCHC: 32.6 g/dL (ref 30.0–36.0)
MCV: 95.3 fL (ref 80.0–100.0)
Monocytes Absolute: 0.7 10*3/uL (ref 0.1–1.0)
Monocytes Relative: 12 %
Neutro Abs: 3.6 10*3/uL (ref 1.7–7.7)
Neutrophils Relative %: 67 %
Platelet Count: 210 10*3/uL (ref 150–400)
RBC: 4.03 MIL/uL — ABNORMAL LOW (ref 4.22–5.81)
RDW: 13.3 % (ref 11.5–15.5)
WBC Count: 5.4 10*3/uL (ref 4.0–10.5)
nRBC: 0 % (ref 0.0–0.2)

## 2022-09-03 LAB — IRON AND IRON BINDING CAPACITY (CC-WL,HP ONLY)
Iron: 79 ug/dL (ref 45–182)
Saturation Ratios: 33 % (ref 17.9–39.5)
TIBC: 237 ug/dL — ABNORMAL LOW (ref 250–450)
UIBC: 158 ug/dL (ref 117–376)

## 2022-09-03 LAB — FERRITIN: Ferritin: 878 ng/mL — ABNORMAL HIGH (ref 24–336)

## 2022-09-03 LAB — CMP (CANCER CENTER ONLY)
ALT: 9 U/L (ref 0–44)
AST: 14 U/L — ABNORMAL LOW (ref 15–41)
Albumin: 4.3 g/dL (ref 3.5–5.0)
Alkaline Phosphatase: 68 U/L (ref 38–126)
Anion gap: 7 (ref 5–15)
BUN: 14 mg/dL (ref 8–23)
CO2: 32 mmol/L (ref 22–32)
Calcium: 9.2 mg/dL (ref 8.9–10.3)
Chloride: 102 mmol/L (ref 98–111)
Creatinine: 0.98 mg/dL (ref 0.61–1.24)
GFR, Estimated: >60 mL/min (ref >60)
Glucose, Bld: 104 mg/dL — ABNORMAL HIGH (ref 70–99)
Potassium: 4.3 mmol/L (ref 3.5–5.1)
Sodium: 141 mmol/L (ref 135–145)
Total Bilirubin: 0.5 mg/dL (ref 0.3–1.2)
Total Protein: 7.1 g/dL (ref 6.5–8.1)

## 2022-09-03 NOTE — Progress Notes (Signed)
Hematology and Oncology Follow Up Visit  Noah Scott [id number] 09/28/1935 86 y.o. 09/03/2022   Principle Diagnosis:  Iron deficiency anemia  Current Therapy:   IV iron given-Monoferric-on  04/02/2022     Interim History:  Noah Scott is back for his follow-up.  He seems to be doing okay.  The problems is having more in the way of his back pain.  He is no longer on the ibuprofen.  He is taken off this because he is on Effient.  He is holding off on doing remodeling right now.  He is going to get break during the holidays.  When we last saw him back in September, his ferritin was 645 with an iron saturation of 35%.  He has had no change in bowel or bladder habits.  There is been no melena or bright red blood per rectum.  He has had no cough or shortness of breath.  He has had no fever.  There is been no rashes.  He has had no leg swelling.  Overall, I would say that his performance status is probably ECOG 1.    Medications:  Current Outpatient Medications:    acetaminophen (TYLENOL) 650 MG CR tablet, Take 650 mg by mouth every 8 (eight) hours as needed for pain., Disp: , Rfl:    Aromatic Inhalants (VICKS VAPOR INHALER IN), Inhale 1 Inhaler into the lungs as needed (stuffy nose)., Disp: , Rfl:    cyanocobalamin (VITAMIN B12) 1000 MCG tablet, Take 1,000 mcg by mouth daily., Disp: , Rfl:    docusate sodium (COLACE) 100 MG capsule, Take 100 mg by mouth 2 (two) times daily., Disp: , Rfl:    doxazosin (CARDURA) 8 MG tablet, Take 8 mg by mouth daily., Disp: , Rfl:    furosemide (LASIX) 20 MG tablet, TAKE 2 TABLET EVERY OTHER DAY AND 1 TABLET ON ODD DAYS. MAY TAKE AN ADDITIONAL TABLET FOR WEIGHT GAIN OF 3LBS OVERNIGHT OR INCREASED SWELLING, Disp: 200 tablet, Rfl: 3   losartan (COZAAR) 50 MG tablet, Take 50 mg by mouth daily., Disp: , Rfl:    metoprolol tartrate (LOPRESSOR) 25 MG tablet, Take 0.5 tablets (12.5 mg total) by mouth 2 (two) times daily. (Patient taking differently: Take 25 mg by  mouth 2 (two) times daily.), Disp: 30 tablet, Rfl: 3   nitroGLYCERIN (NITROSTAT) 0.6 MG SL tablet, Place 0.6 mg under the tongue as needed., Disp: , Rfl:    potassium chloride SA (KLOR-CON M20) 20 MEQ tablet, Take 1 tablet (20 mEq total) by mouth daily. (Patient taking differently: Take 10 mEq by mouth daily.), Disp: 90 tablet, Rfl: 3   prasugrel (EFFIENT) 10 MG TABS tablet, TAKE 1 TABLET(10 MG) BY MOUTH DAILY, Disp: 90 tablet, Rfl: 2   rosuvastatin (CRESTOR) 20 MG tablet, Take 1 tablet (20 mg total) by mouth daily., Disp: 90 tablet, Rfl: 3   traMADol (ULTRAM) 50 MG tablet, Take by mouth every 6 (six) hours as needed., Disp: , Rfl:    traZODone (DESYREL) 50 MG tablet, Take 50 mg by mouth at bedtime., Disp: , Rfl:   Allergies:  Allergies  Allergen Reactions   Plavix [Clopidogrel Bisulfate] Swelling   Brilinta [Ticagrelor] Cough   Enalapril Other (See Comments)    Past Medical History, Surgical history, Social history, and Family History were reviewed and updated.  Review of Systems: Review of Systems  Constitutional:  Positive for unexpected weight change.  HENT:  Negative.    Eyes: Negative.   Respiratory: Negative.    Cardiovascular: Negative.  Gastrointestinal: Negative.   Endocrine: Negative.   Genitourinary: Negative.    Musculoskeletal: Negative.   Skin: Negative.   Neurological: Negative.   Hematological: Negative.   Psychiatric/Behavioral: Negative.      Physical Exam:  height is '5\' 10"'$  (1.778 m) and weight is 208 lb (94.3 kg). His oral temperature is 98.7 F (37.1 C). His blood pressure is 104/64 and his pulse is 76. His respiration is 19 and oxygen saturation is 99%.   Wt Readings from Last 3 Encounters:  09/03/22 208 lb (94.3 kg)  08/13/22 205 lb (93 kg)  06/04/22 195 lb (88.5 kg)    Physical Exam Vitals reviewed.  HENT:     Head: Normocephalic and atraumatic.  Eyes:     Pupils: Pupils are equal, round, and reactive to light.  Cardiovascular:     Rate  and Rhythm: Normal rate and regular rhythm.     Heart sounds: Normal heart sounds.  Pulmonary:     Effort: Pulmonary effort is normal.     Breath sounds: Normal breath sounds.  Abdominal:     General: Bowel sounds are normal.     Palpations: Abdomen is soft.  Musculoskeletal:        General: No tenderness or deformity. Normal range of motion.     Cervical back: Normal range of motion.  Lymphadenopathy:     Cervical: No cervical adenopathy.  Skin:    General: Skin is warm and dry.     Findings: No erythema or rash.  Neurological:     Mental Status: He is alert and oriented to person, place, and time.  Psychiatric:        Behavior: Behavior normal.        Thought Content: Thought content normal.        Judgment: Judgment normal.      Lab Results  Component Value Date   WBC 5.4 09/03/2022   HGB 12.5 (L) 09/03/2022   HCT 38.4 (L) 09/03/2022   MCV 95.3 09/03/2022   PLT 210 09/03/2022     Chemistry      Component Value Date/Time   NA 144 06/04/2022 1137   NA 142 11/21/2021 0901   K 3.9 06/04/2022 1137   CL 107 06/04/2022 1137   CO2 31 06/04/2022 1137   BUN 24 (H) 06/04/2022 1137   BUN 12 11/21/2021 0901   CREATININE 1.33 (H) 06/04/2022 1137      Component Value Date/Time   CALCIUM 8.9 06/04/2022 1137   ALKPHOS 56 06/04/2022 1137   AST 13 (L) 06/04/2022 1137   ALT 7 06/04/2022 1137   BILITOT 0.3 06/04/2022 1137     Impression and Plan: Noah Scott is a very nice 86 year old white male.  He has recurrent iron deficiency anemia.  He last got IV iron back in July.  His hemoglobin is come up quite nicely.  Will try to get him through the Winter.  I would like to see him back in March.  He had a birthday on [birth year].  This is a special day because it is Noah Scott's birthday also.   Hopefully, his other doctors will be able to get him on a good pain regimen for his chronic pain so that he is functional.   Volanda Napoleon, MD 12/18/202311:51 AM

## 2022-09-04 ENCOUNTER — Telehealth: Payer: Self-pay

## 2022-09-04 NOTE — Telephone Encounter (Signed)
-----   Message from Volanda Napoleon, MD sent at 09/03/2022  5:10 PM EST ----- Please call and let him know that the iron levels are okay.  Thanks.  [name]

## 2022-09-04 NOTE — Telephone Encounter (Signed)
Called and informed patient of lab results, patient verbalized understanding and denies any questions or concerns at this time.   

## 2022-10-08 ENCOUNTER — Encounter: Payer: Medicare Other | Admitting: Physical Medicine and Rehabilitation

## 2022-10-17 ENCOUNTER — Ambulatory Visit (INDEPENDENT_AMBULATORY_CARE_PROVIDER_SITE_OTHER): Payer: Medicare Other

## 2022-10-17 VITALS — Wt 208.0 lb

## 2022-10-17 DIAGNOSIS — Z0000 Encounter for general adult medical examination without abnormal findings: Secondary | ICD-10-CM

## 2022-10-17 NOTE — Patient Instructions (Signed)
Mr. Noah Scott , Thank you for taking time to come for your Medicare Wellness Visit. I appreciate your ongoing commitment to your health goals. Please review the following plan we discussed and let me know if I can assist you in the future.   These are the goals we discussed:  Goals      Patient Stated     Stay healthy         This is a list of the screening recommended for you and due dates:  Health Maintenance  Topic Date Due   DTaP/Tdap/Td vaccine (1 - Tdap) Never done   Pneumonia Vaccine (2 - PPSV23 or PCV20) 02/20/2015   Flu Shot  04/17/2022   COVID-19 Vaccine (4 - 2023-24 season) 05/18/2022   Medicare Annual Wellness Visit  10/18/2023   Zoster (Shingles) Vaccine  Completed   HPV Vaccine  Aged Out    Advanced directives: Please bring a copy of your health care power of attorney and living will to the office at your convenience.  Conditions/risks identified: stay healthy   Next appointment: Follow up in one year for your annual wellness visit.   Preventive Care 70 Years and Older, Male  Preventive care refers to lifestyle choices and visits with your health care provider that can promote health and wellness. What does preventive care include? A yearly physical exam. This is also called an annual well check. Dental exams once or twice a year. Routine eye exams. Ask your health care provider how often you should have your eyes checked. Personal lifestyle choices, including: Daily care of your teeth and gums. Regular physical activity. Eating a healthy diet. Avoiding tobacco and drug use. Limiting alcohol use. Practicing safe sex. Taking low doses of aspirin every day. Taking vitamin and mineral supplements as recommended by your health care provider. What happens during an annual well check? The services and screenings done by your health care provider during your annual well check will depend on your age, overall health, lifestyle risk factors, and family history of  disease. Counseling  Your health care provider may ask you questions about your: Alcohol use. Tobacco use. Drug use. Emotional well-being. Home and relationship well-being. Sexual activity. Eating habits. History of falls. Memory and ability to understand (cognition). Work and work Statistician. Screening  You may have the following tests or measurements: Height, weight, and BMI. Blood pressure. Lipid and cholesterol levels. These may be checked every 5 years, or more frequently if you are over 83 years old. Skin check. Lung cancer screening. You may have this screening every year starting at age 66 if you have a 30-pack-year history of smoking and currently smoke or have quit within the past 15 years. Fecal occult blood test (FOBT) of the stool. You may have this test every year starting at age 38. Flexible sigmoidoscopy or colonoscopy. You may have a sigmoidoscopy every 5 years or a colonoscopy every 10 years starting at age 39. Prostate cancer screening. Recommendations will vary depending on your family history and other risks. Hepatitis C blood test. Hepatitis B blood test. Sexually transmitted disease (STD) testing. Diabetes screening. This is done by checking your blood sugar (glucose) after you have not eaten for a while (fasting). You may have this done every 1-3 years. Abdominal aortic aneurysm (AAA) screening. You may need this if you are a current or former smoker. Osteoporosis. You may be screened starting at age 28 if you are at high risk. Talk with your health care provider about your test results,  treatment options, and if necessary, the need for more tests. Vaccines  Your health care provider may recommend certain vaccines, such as: Influenza vaccine. This is recommended every year. Tetanus, diphtheria, and acellular pertussis (Tdap, Td) vaccine. You may need a Td booster every 10 years. Zoster vaccine. You may need this after age 62. Pneumococcal 13-valent  conjugate (PCV13) vaccine. One dose is recommended after age 22. Pneumococcal polysaccharide (PPSV23) vaccine. One dose is recommended after age 47. Talk to your health care provider about which screenings and vaccines you need and how often you need them. This information is not intended to replace advice given to you by your health care provider. Make sure you discuss any questions you have with your health care provider. Document Released: 09/30/2015 Document Revised: 05/23/2016 Document Reviewed: 07/05/2015 Elsevier Interactive Patient Education  2017 [employer] Prevention in the Home Falls can cause injuries. They can happen to people of all ages. There are many things you can do to make your home safe and to help prevent falls. What can I do on the outside of my home? Regularly fix the edges of walkways and driveways and fix any cracks. Remove anything that might make you trip as you walk through a door, such as a raised step or threshold. Trim any bushes or trees on the path to your home. Use bright outdoor lighting. Clear any walking paths of anything that might make someone trip, such as rocks or tools. Regularly check to see if handrails are loose or broken. Make sure that both sides of any steps have handrails. Any raised decks and porches should have guardrails on the edges. Have any leaves, snow, or ice cleared regularly. Use sand or salt on walking paths during winter. Clean up any spills in your garage right away. This includes oil or grease spills. What can I do in the bathroom? Use night lights. Install grab bars by the toilet and in the tub and shower. Do not use towel bars as grab bars. Use non-skid mats or decals in the tub or shower. If you need to sit down in the shower, use a plastic, non-slip stool. Keep the floor dry. Clean up any water that spills on the floor as soon as it happens. Remove soap buildup in the tub or shower regularly. Attach bath mats  securely with double-sided non-slip rug tape. Do not have throw rugs and other things on the floor that can make you trip. What can I do in the bedroom? Use night lights. Make sure that you have a light by your bed that is easy to reach. Do not use any sheets or blankets that are too big for your bed. They should not hang down onto the floor. Have a firm chair that has side arms. You can use this for support while you get dressed. Do not have throw rugs and other things on the floor that can make you trip. What can I do in the kitchen? Clean up any spills right away. Avoid walking on wet floors. Keep items that you use a lot in easy-to-reach places. If you need to reach something above you, use a strong step stool that has a grab bar. Keep electrical cords out of the way. Do not use floor polish or wax that makes floors slippery. If you must use wax, use non-skid floor wax. Do not have throw rugs and other things on the floor that can make you trip. What can I do with my stairs? Do  not leave any items on the stairs. Make sure that there are handrails on both sides of the stairs and use them. Fix handrails that are broken or loose. Make sure that handrails are as long as the stairways. Check any carpeting to make sure that it is firmly attached to the stairs. Fix any carpet that is loose or worn. Avoid having throw rugs at the top or bottom of the stairs. If you do have throw rugs, attach them to the floor with carpet tape. Make sure that you have a light switch at the top of the stairs and the bottom of the stairs. If you do not have them, ask someone to add them for you. What else can I do to help prevent falls? Wear shoes that: Do not have high heels. Have rubber bottoms. Are comfortable and fit you well. Are closed at the toe. Do not wear sandals. If you use a stepladder: Make sure that it is fully opened. Do not climb a closed stepladder. Make sure that both sides of the stepladder  are locked into place. Ask someone to hold it for you, if possible. Clearly mark and make sure that you can see: Any grab bars or handrails. First and last steps. Where the edge of each step is. Use tools that help you move around (mobility aids) if they are needed. These include: Canes. Walkers. Scooters. Crutches. Turn on the lights when you go into a dark area. Replace any light bulbs as soon as they burn out. Set up your furniture so you have a clear path. Avoid moving your furniture around. If any of your floors are uneven, fix them. If there are any pets around you, be aware of where they are. Review your medicines with your doctor. Some medicines can make you feel dizzy. This can increase your chance of falling. Ask your doctor what other things that you can do to help prevent falls. This information is not intended to replace advice given to you by your health care provider. Make sure you discuss any questions you have with your health care provider. Document Released: 06/30/2009 Document Revised: 02/09/2016 Document Reviewed: 10/08/2014 Elsevier Interactive Patient Education  2017 [employer].

## 2022-10-17 NOTE — Progress Notes (Addendum)
I connected with  Noah Scott on 10/17/22 by a audio enabled telemedicine application and verified that I am speaking with the correct person using two identifiers.  Patient Location: Home  Provider Location: Home Office  I discussed the limitations of evaluation and management by telemedicine. The patient expressed understanding and agreed to proceed.   Subjective:   Noah Scott is a 87 y.o. male who presents for an Initial Medicare Annual Wellness Visit.  Review of Systems     Cardiac Risk Factors include: advanced age (>104mn, >>85women);hypertension;dyslipidemia;male gender     Objective:    Today's Vitals   10/17/22 1005  Weight: 208 lb (94.3 kg)   Body mass index is 29.84 kg/m.     10/17/2022   10:13 AM 09/03/2022   11:37 AM 06/04/2022   11:54 AM 03/19/2022   11:51 AM 01/04/2022   11:54 AM 10/16/2021    9:33 AM 09/07/2021   10:05 AM  Advanced Directives  Does Patient Have a Medical Advance Directive? Yes Yes Yes Yes Yes Yes Yes  Type of AParamedicof AHelena West SideLiving will Living will Living will;Healthcare Power of [place] will Living will;Healthcare Power of Attorney Living will;Healthcare Power of Attorney Living will  Does patient want to make changes to medical advance directive?  No - Patient declined No - Patient declined No - Patient declined No - Patient declined No - Patient declined No - Patient declined  Copy of [place] Chart? No - copy requested   No - copy requested     Would patient like information on creating a medical advance directive?    No - Patient declined       Current Medications (verified) Outpatient Encounter Medications as of 10/17/2022  Medication Sig   acetaminophen (TYLENOL) 650 MG CR tablet Take 650 mg by mouth every 8 (eight) hours as needed for pain.   Aromatic Inhalants (VICKS VAPOR INHALER IN) Inhale 1 Inhaler into the lungs as needed (stuffy nose).    cyanocobalamin (VITAMIN B12) 1000 MCG tablet Take 1,000 mcg by mouth daily.   docusate sodium (COLACE) 100 MG capsule Take 100 mg by mouth 2 (two) times daily.   doxazosin (CARDURA) 8 MG tablet Take 8 mg by mouth daily.   furosemide (LASIX) 20 MG tablet TAKE 2 TABLET EVERY OTHER DAY AND 1 TABLET ON ODD DAYS. MAY TAKE AN ADDITIONAL TABLET FOR WEIGHT GAIN OF 3LBS OVERNIGHT OR INCREASED SWELLING   losartan (COZAAR) 50 MG tablet Take 50 mg by mouth daily.   metoprolol tartrate (LOPRESSOR) 25 MG tablet Take 0.5 tablets (12.5 mg total) by mouth 2 (two) times daily. (Patient taking differently: Take 25 mg by mouth 2 (two) times daily.)   nitroGLYCERIN (NITROSTAT) 0.6 MG SL tablet Place 0.6 mg under the tongue as needed.   traMADol (ULTRAM) 50 MG tablet Take by mouth every 6 (six) hours as needed.   traZODone (DESYREL) 50 MG tablet Take 50 mg by mouth at bedtime.   prasugrel (EFFIENT) 10 MG TABS tablet TAKE 1 TABLET(10 MG) BY MOUTH DAILY (Patient not taking: Reported on 10/17/2022)   rosuvastatin (CRESTOR) 20 MG tablet Take 1 tablet (20 mg total) by mouth daily.   [DISCONTINUED] potassium chloride SA (KLOR-CON M20) 20 MEQ tablet Take 1 tablet (20 mEq total) by mouth daily. (Patient taking differently: Take 10 mEq by mouth daily.)   No facility-administered encounter medications on file as of 10/17/2022.    Allergies (verified) Plavix [clopidogrel  bisulfate], Brilinta [ticagrelor], and Enalapril   History: Past Medical History:  Diagnosis Date   CAD S/P percutaneous coronary angioplasty 2001   a) 2001 ([place]): PCI OM1 - now with ~65% ISR.;; b) NSTEMI 11/2017 - ost-prox RCA 75-65% (Overlapping ONYX DES 3.0 x 26 & 3.0 x 8 --> 3.5-3.3 mm). m-dRCA 95% (ONYX DES 2.75 x 18 mm -> 3.0 mm). Ost OM1 65% ISR (med Rx). dRCA-OstRDA (50%-25%)   Essential (primary) hypertension 11/21/2017   Hyperlipidemia 11/21/2017   Iron deficiency anemia due to chronic blood loss 08/08/2021   Iron malabsorption 08/08/2021    Neurogenic claudication due to lumbar spinal stenosis 02/06/2018   NSTEMI (non-ST elevated myocardial infarction) ([place]) 11/21/2017   Multiple RCA lesions - 2 Overlapping DES Ost-Prox RCA & 1 in mid-distal RCA   Spinal stenosis of lumbar region    Venous reflux 11/2020   Venous Dopplers 12/15/2020: No DVT or superficial venous thrombosis bilaterally.  Venous reflux noted in bilateral FV and saphenofemoral junctions, R GSV in the thigh and R SFV, left proximal thigh GSV. -->  No invasive options due to deep and superficial venous reflux.   Past Surgical History:  Procedure Laterality Date   CORONARY STENT INTERVENTION N/A 11/25/2017   Procedure: CORONARY STENT INTERVENTION;  Surgeon: Leonie Man, MD;  Location: [facility];  Service: Cardiovascular:   m-dRCA 95%  (DES PCI - 0%.  Resolute ONYX DES 2.75 x 18 mm - ~3.0 mm). Ost-proxRCA 75-65% (DES PCI-0%. 2 overlapping Resolute ONYX DES 3.0 x 26 & 3.0 x 8 mm --> 3.5-3.3 mm)   CORONARY STENT INTERVENTION  2001   ([place]): PCI OM1   LEA DOPPLERS  12/12/2017   Normal bilateral ABIs and TBI's.   LEFT HEART CATH AND CORONARY ANGIOGRAPHY N/A 11/25/2017   Procedure: LEFT HEART CATH AND CORONARY ANGIOGRAPHY;  Surgeon: Leonie Man, MD;  Location: [facility];  Service: Cardiovascular:  Ost-proxRCA 75&65%(DES PCI), m-dRCA 95% (DES PCI), dRCA 50%-25% ost rPDA. ostOM1 65% ISR (from 2001).    LUMBAR LAMINECTOMY/DECOMPRESSION MICRODISCECTOMY Bilateral 06/13/2018   Procedure: Laminectomy and Foraminotomy - L1-L2 - L2-L3 - L3-L4 - bilateral;  Surgeon: Eustace Moore, MD;  Location: [facility];  Service: Neurosurgery;  Laterality: Bilateral;   TRANSTHORACIC ECHOCARDIOGRAM  11/23/2017   a) 11/2017: Nl LV size & Fxn. EF 60 - 65%.  No RWMA.  GR 1 DD.  Mild RV dilation.; b) 05/2020:([facility]): Normal LV size and function.  EF 60 and 65%.  Normal RV size and function.  Aortic sclerosis with no stenosis.  No AI.  Mildly dilated ascending aorta.  CVP ~5  mm.   TRANSTHORACIC ECHOCARDIOGRAM  03/31/2021   EF 55 to 60%.  GR 1 DD.  Ascending aorta measured at 41 mm-moderate dilation.  Recommended follow-up with imaging study in 6 months to reassess.   Family History  Problem Relation Age of Onset   Heart disease Father        Pt does not know details   Stroke Neg Hx    Diabetes Neg Hx    Social History   Socioeconomic History   Marital status: Widowed    Spouse name: Not on file   Number of children: 1   Years of education: 45   Highest education level: Not on file  Occupational History   Occupation: retired Geophysical data processor  Tobacco Use   Smoking status: Former    Types: Cigarettes    Quit date: 03/02/1969  Years since quitting: 53.6   Smokeless tobacco: Never  Vaping Use   Vaping Use: Never used  Substance and Sexual Activity   Alcohol use: Not Currently   Drug use: No   Sexual activity: Not on file  Other Topics Concern   Not on file  Social History Narrative   Lives alone in an apartment on the first floor.  Has one daughter.  Retired Geophysical data processor.  Education: some college.    Social Determinants of Health   Financial Resource Strain: Low Risk  (10/17/2022)   Overall Financial Resource Strain (CARDIA)    Difficulty of Paying Living Expenses: Not hard at all  Food Insecurity: No Food Insecurity (10/17/2022)   Hunger Vital Sign    Worried About Running Out of Food in the Last Year: Never true    Ran Out of Food in the Last Year: Never true  Transportation Needs: No Transportation Needs (10/17/2022)   PRAPARE - Hydrologist (Medical): No    Lack of Transportation (Non-Medical): No  Physical Activity: Insufficiently Active (10/17/2022)   Exercise Vital Sign    Days of Exercise per Week: 7 days    Minutes of Exercise per Session: 10 min  Stress: No Stress Concern Present (10/17/2022)   [place]    Feeling of  Stress : Not at all  Social Connections: Moderately Isolated (10/17/2022)   Social Connection and Isolation Panel [NHANES]    Frequency of Communication with Friends and Family: More than three times a week    Frequency of Social Gatherings with Friends and Family: More than three times a week    Attends Religious Services: Never    Marine scientist or Organizations: Yes    Attends Archivist Meetings: 1 to 4 times per year    Marital Status: Widowed    Tobacco Counseling Counseling given: Not Answered   Clinical Intake:  Pre-visit preparation completed: Yes  Pain : No/denies pain     BMI - recorded: 29.84 Nutritional Status: BMI > 30  Obese Nutritional Risks: None Diabetes: No  How often do you need to have someone help you when you read instructions, pamphlets, or other written materials from your doctor or pharmacy?: 1 - Never  Diabetic?no  Interpreter Needed?: No  Information entered by :: Charlott Rakes, LPN   Activities of Daily Living    10/17/2022   10:15 AM  In your present state of health, do you have any difficulty performing the following activities:  Hearing? 0  Vision? 0  Difficulty concentrating or making decisions? 0  Walking or climbing stairs? 0  Dressing or bathing? 0  Doing errands, shopping? 0  Preparing Food and eating ? N  Using the Toilet? N  In the past six months, have you accidently leaked urine? N  Do you have problems with loss of bowel control? N  Managing your Medications? N  Managing your Finances? N  Housekeeping or managing your Housekeeping? N    Patient Care Team: Saguier, Iris Pert as PCP - General (Internal Medicine) Leonie Man, MD as PCP - Cardiology (Cardiology) Eustace Moore, MD as Consulting Physician (Neurosurgery)  Indicate any recent Medical Services you may have received from other than [facility] providers in the past year (date may be approximate).     Assessment:   This is a routine  wellness examination for [facility].  Hearing/Vision screen Hearing Screening - Comments:: Pt denies  any hearing issues  Vision Screening - Comments:: Pt follows up with provider for annual eye exams   Dietary issues and exercise activities discussed: Current Exercise Habits: Home exercise routine, Type of exercise: stretching, Time (Minutes): 10, Frequency (Times/Week): 7, Weekly Exercise (Minutes/Week): 70   Goals Addressed             This Visit's Progress    Patient Stated       Stay healthy        Depression Screen    10/17/2022   10:10 AM 08/13/2022   10:33 AM 05/02/2018   12:07 PM 02/12/2018    4:54 PM  PHQ 2/9 Scores  PHQ - 2 Score 0 0 0 0    Fall Risk    10/17/2022   10:14 AM 08/13/2022   10:32 AM 02/06/2018    8:39 AM 02/04/2018   10:01 AM  Fall Risk   Falls in the past year? 0 0 No No  Number falls in past yr: 0 0    Injury with Fall? 0 0    Risk for fall due to :    Other (Comment)  Risk for fall due to: Comment use cane to take pressure off  back   Single leg stand less than 5 seconds. History of vertigo.  Follow up Falls prevention discussed Falls evaluation completed      FALL RISK PREVENTION PERTAINING TO THE HOME:  Any stairs in or around the home? Yes  If so, are there any without handrails? No  Home free of loose throw rugs in walkways, pet beds, electrical cords, etc? Yes  Adequate lighting in your home to reduce risk of falls? Yes   ASSISTIVE DEVICES UTILIZED TO PREVENT FALLS:  Life alert? No  Use of a cane, walker or w/c? Yes  Grab bars in the bathroom? Yes  Shower chair or bench in shower? No  Elevated toilet seat or a handicapped toilet? No   TIMED UP AND GO:  Was the test performed? No .    Cognitive Function:        10/17/2022   10:16 AM  6CIT Screen  What Year? 0 points  What month? 0 points  What time? 0 points  Count back from 20 0 points  Months in reverse 0 points  Repeat phrase 0 points  Total Score 0 points     Immunizations Immunization History  Administered Date(s) Administered   Influenza, High Dose Seasonal PF 06/23/2017   Influenza-Unspecified 08/09/2005, 07/18/2006, 06/19/2019   Moderna Sars-Covid-2 Vaccination 10/31/2019, 11/28/2019, 07/25/2020   Pneumococcal Conjugate-13 02/19/2014   Zoster Recombinat (Shingrix) 01/14/2017, 06/28/2017, 06/28/2017   Zoster, Live 02/19/2014    TDAP status: Due, Education has been provided regarding the importance of this vaccine. Advised may receive this vaccine at local pharmacy or Health Dept. Aware to provide a copy of the vaccination record if obtained from local pharmacy or Health Dept. Verbalized acceptance and understanding.  Flu Vaccine status: Due, Education has been provided regarding the importance of this vaccine. Advised may receive this vaccine at local pharmacy or Health Dept. Aware to provide a copy of the vaccination record if obtained from local pharmacy or Health Dept. Verbalized acceptance and understanding.  Pneumococcal vaccine status: Due, Education has been provided regarding the importance of this vaccine. Advised may receive this vaccine at local pharmacy or Health Dept. Aware to provide a copy of the vaccination record if obtained from local pharmacy or Health Dept. Verbalized acceptance and understanding.  Covid-19  vaccine status: Completed vaccines  Qualifies for Shingles Vaccine? Yes   Zostavax completed Yes   Shingrix Completed?: Yes  Screening Tests Health Maintenance  Topic Date Due   DTaP/Tdap/Td (1 - Tdap) Never done   Pneumonia Vaccine 15+ Years old (2 - PPSV23 or PCV20) 02/20/2015   INFLUENZA VACCINE  04/17/2022   COVID-19 Vaccine (4 - 2023-24 season) 05/18/2022   Medicare Annual Wellness (AWV)  10/18/2023   Zoster Vaccines- Shingrix  Completed   HPV VACCINES  Aged Out    Health Maintenance  Health Maintenance Due  Topic Date Due   DTaP/Tdap/Td (1 - Tdap) Never done   Pneumonia Vaccine 68+ Years old (2  - PPSV23 or PCV20) 02/20/2015   INFLUENZA VACCINE  04/17/2022   COVID-19 Vaccine (4 - 2023-24 season) 05/18/2022    Colorectal cancer screening: No longer required.    Additional Screening:   Vision Screening: Recommended annual ophthalmology exams for early detection of glaucoma and other disorders of the eye. Is the patient up to date with their annual eye exam?  Yes  Who is the provider or what is the name of the office in which the patient attends annual eye exams? Unsure of providers name  If pt is not established with a provider, would they like to be referred to a provider to establish care? No .   Dental Screening: Recommended annual dental exams for proper oral hygiene  Community Resource Referral / Chronic Care Management: CRR required this visit?  No   CCM required this visit?  No      Plan:     I have personally reviewed and noted the following in the patient's chart:   Medical and social history Use of alcohol, tobacco or illicit drugs  Current medications and supplements including opioid prescriptions. Patient is currently taking opioid prescriptions. Information provided to patient regarding non-opioid alternatives. Patient advised to discuss non-opioid treatment plan with their provider. Functional ability and status Nutritional status Physical activity Advanced directives List of other physicians Hospitalizations, surgeries, and ER visits in previous 12 months Vitals Screenings to include cognitive, depression, and falls Referrals and appointments  In addition, I have reviewed and discussed with patient certain preventive protocols, quality metrics, and best practice recommendations. A written personalized care plan for preventive services as well as general preventive health recommendations were provided to patient.     Willette Brace, LPN   1/69/6789   Nurse Notes: pt is requesting refill on rosuvastatin 20 mg stated RX stated no refills      Review and Agree with assessment & plan of LPN   Mackie Pai, PA-C

## 2022-10-19 ENCOUNTER — Other Ambulatory Visit: Payer: Self-pay | Admitting: Medical

## 2022-10-19 MED ORDER — ROSUVASTATIN CALCIUM 20 MG PO TABS: 20.0000 mg | ORAL_TABLET | Freq: Every day | ORAL | 3 refills | Status: DC

## 2022-10-19 NOTE — Telephone Encounter (Signed)
Prescribed by previous provider please advise

## 2022-10-19 NOTE — Addendum Note (Signed)
Addended by: Anabel Halon on: 10/19/2022 09:09 PM   Modules accepted: Orders

## 2022-10-19 NOTE — Telephone Encounter (Signed)
Prescription Request  10/19/2022  Is this a "Controlled Substance" medicine? No  LOV: 08/13/2022  What is the name of the medication or equipment? rosuvastatin (CRESTOR) 20 MG tablet   Have you contacted your pharmacy to request a refill? No   Which pharmacy would you like this sent to?  [id number] - [place], [place] - [address] [place] [address] Phone: [phone] Fax: [phone]    Patient notified that their request is being sent to the clinical staff for review and that they should receive a response within 2 business days.   Please advise at [facility] [phone]

## 2022-10-29 ENCOUNTER — Ambulatory Visit: Payer: Medicare Other | Admitting: Physical Medicine and Rehabilitation

## 2022-11-13 ENCOUNTER — Ambulatory Visit (INDEPENDENT_AMBULATORY_CARE_PROVIDER_SITE_OTHER): Payer: Medicare Other | Admitting: Medical

## 2022-11-13 VITALS — BP 136/68 | HR 66 | Temp 98.0°F | Resp 18 | Ht 70.0 in | Wt 214.4 lb

## 2022-11-13 DIAGNOSIS — I10 Essential (primary) hypertension: Secondary | ICD-10-CM

## 2022-11-13 DIAGNOSIS — G4700 Insomnia, unspecified: Secondary | ICD-10-CM | POA: Diagnosis not present

## 2022-11-13 DIAGNOSIS — E785 Hyperlipidemia, unspecified: Secondary | ICD-10-CM | POA: Diagnosis not present

## 2022-11-13 DIAGNOSIS — Z9861 Coronary angioplasty status: Secondary | ICD-10-CM

## 2022-11-13 DIAGNOSIS — R739 Hyperglycemia, unspecified: Secondary | ICD-10-CM

## 2022-11-13 DIAGNOSIS — D500 Iron deficiency anemia secondary to blood loss (chronic): Secondary | ICD-10-CM | POA: Diagnosis not present

## 2022-11-13 DIAGNOSIS — I2510 Atherosclerotic heart disease of native coronary artery without angina pectoris: Secondary | ICD-10-CM | POA: Diagnosis not present

## 2022-11-13 NOTE — Patient Instructions (Addendum)
Htn- continue losartan, metoprolol and lasix.  Chronic pain. Continue with Premeir. Saw pain management twice already.   Bph- continue  cardura 8 mg daily.  Hyperlipidemia- continue crestor 20 mg daily.  Insomnia- appears related to lower extremity neuropathy. Glad to her that you repot  trazadone helps with your lower ext nerve pain. Typically rx'd for insomnia.  For lower ext neuropathic type pain. Continue tramadol with pain management.   Get cmp, lipid panel and A1c.  Ask pt to call [facility] and ask which pneumonia vaccine that he may have received from them.  Follow up date to be determined after lab review. At least every 6 months but possibly sooner.

## 2022-11-13 NOTE — Progress Notes (Signed)
Subjective:    Patient ID: Noah Scott, male    DOB: [birth year], 87 y.o.   MRN: [id number]  HPI  I actually saw pt last year. He thought he never saw me before.  He states had 3 pcp in past 4 years. He went to [place] and couple of pcp at [place].  He states last time he went to pcp and some possible misunderstanding. Pt states last person he talked to brought in chaperones and he thought that was odd?   [facility] in past he thinks did not interest in him?    Last visit with me AVS below.  "Htn- losartan 50 mg daily and Metoprolol daily and lasix 20 mg daily.  Chronic pain. He states with Premeir. Saw pain management twice already.   Bph- pt is on cardura 8 mg daily.  Hyperlipidemia- pt is on crestor 20 mg daily.  Insomnia- trazadone 50 mg daily.   2 years ago he was given nitro. He states never had to use. Pt has hx of stent placement and follow ups with Dr. Selena Batten.   I saw pt year ago and he does not remember.  Last avs   For chronic back pain continue with pain management.  You might ask your upcoming new pain management provider whether or not Lyrica might be an option with of low-dose narcotic. They might switch you off of tramadol."  He has new pain management.   Has neuropathic pain to lower ext. Severe side effects with gabapentin. He notes since starting trazadone does help with nerve pain. He wants to increase trazadone to 100 mg at night.  Also on tylenol as well.      Review of Systems  Constitutional:  Negative for chills, fatigue and fever.  HENT:  Negative for congestion, drooling and ear discharge.   Respiratory:  Negative for cough, chest tightness, shortness of breath and wheezing.   Cardiovascular:  Negative for chest pain and palpitations.  Gastrointestinal:  Negative for abdominal pain, anal bleeding and blood in stool.  Genitourinary:  Negative for dysuria, frequency, hematuria, penile swelling and testicular pain.  Musculoskeletal:   Negative for back pain, joint swelling and neck pain.  Skin:  Negative for rash.  Neurological:  Negative for dizziness, speech difficulty, weakness, numbness and headaches.  Hematological:  Negative for adenopathy. Does not bruise/bleed easily.  Psychiatric/Behavioral:  Negative for behavioral problems, confusion and sleep disturbance. The patient is not nervous/anxious.     Past Medical History:  Diagnosis Date   CAD S/P percutaneous coronary angioplasty 2001   a) 2001 ([place]): PCI OM1 - now with ~65% ISR.;; b) NSTEMI 11/2017 - ost-prox RCA 75-65% (Overlapping ONYX DES 3.0 x 26 & 3.0 x 8 --> 3.5-3.3 mm). m-dRCA 95% (ONYX DES 2.75 x 18 mm -> 3.0 mm). Ost OM1 65% ISR (med Rx). dRCA-OstRDA (50%-25%)   Essential (primary) hypertension 11/21/2017   Hyperlipidemia 11/21/2017   Iron deficiency anemia due to chronic blood loss 08/08/2021   Iron malabsorption 08/08/2021   Neurogenic claudication due to lumbar spinal stenosis 02/06/2018   NSTEMI (non-ST elevated myocardial infarction) ([place]) 11/21/2017   Multiple RCA lesions - 2 Overlapping DES Ost-Prox RCA & 1 in mid-distal RCA   Spinal stenosis of lumbar region    Venous reflux 11/2020   Venous Dopplers 12/15/2020: No DVT or superficial venous thrombosis bilaterally.  Venous reflux noted in bilateral FV and saphenofemoral junctions, R GSV in the thigh and R SFV, left proximal thigh GSV. -->  No invasive options due to deep and superficial venous reflux.     Social History   Socioeconomic History   Marital status: Widowed    Spouse name: Not on file   Number of children: 1   Years of education: 67   Highest education level: Not on file  Occupational History   Occupation: retired Geophysical data processor  Tobacco Use   Smoking status: Former    Types: Cigarettes    Quit date: 03/02/1969    Years since quitting: 53.7   Smokeless tobacco: Never  Vaping Use   Vaping Use: Never used  Substance and Sexual Activity   Alcohol use: Not  Currently   Drug use: No   Sexual activity: Not on file  Other Topics Concern   Not on file  Social History Narrative   Lives alone in an apartment on the first floor.  Has one daughter.  Retired Geophysical data processor.  Education: some college.    Social Determinants of Health   Financial Resource Strain: Low Risk  (10/17/2022)   Overall Financial Resource Strain (CARDIA)    Difficulty of Paying Living Expenses: Not hard at all  Food Insecurity: No Food Insecurity (10/17/2022)   Hunger Vital Sign    Worried About Running Out of Food in the Last Year: Never true    Ran Out of Food in the Last Year: Never true  Transportation Needs: No Transportation Needs (10/17/2022)   PRAPARE - Hydrologist (Medical): No    Lack of Transportation (Non-Medical): No  Physical Activity: Insufficiently Active (10/17/2022)   Exercise Vital Sign    Days of Exercise per Week: 7 days    Minutes of Exercise per Session: 10 min  Stress: No Stress Concern Present (10/17/2022)   [place]    Feeling of Stress : Not at all  Social Connections: Moderately Isolated (10/17/2022)   Social Connection and Isolation Panel [NHANES]    Frequency of Communication with Friends and Family: More than three times a week    Frequency of Social Gatherings with Friends and Family: More than three times a week    Attends Religious Services: Never    Marine scientist or Organizations: Yes    Attends Archivist Meetings: 1 to 4 times per year    Marital Status: Widowed  Intimate Partner Violence: Not At Risk (10/17/2022)   Humiliation, Afraid, Rape, and Kick questionnaire    Fear of Current or Ex-Partner: No    Emotionally Abused: No    Physically Abused: No    Sexually Abused: No    Past Surgical History:  Procedure Laterality Date   CORONARY STENT INTERVENTION N/A 11/25/2017   Procedure: CORONARY STENT  INTERVENTION;  Surgeon: Leonie Man, MD;  Location: [facility];  Service: Cardiovascular:   m-dRCA 95%  (DES PCI - 0%.  Resolute ONYX DES 2.75 x 18 mm - ~3.0 mm). Ost-proxRCA 75-65% (DES PCI-0%. 2 overlapping Resolute ONYX DES 3.0 x 26 & 3.0 x 8 mm --> 3.5-3.3 mm)   CORONARY STENT INTERVENTION  2001   ([place]): PCI OM1   LEA DOPPLERS  12/12/2017   Normal bilateral ABIs and TBI's.   LEFT HEART CATH AND CORONARY ANGIOGRAPHY N/A 11/25/2017   Procedure: LEFT HEART CATH AND CORONARY ANGIOGRAPHY;  Surgeon: Leonie Man, MD;  Location: [facility];  Service: Cardiovascular:  Ost-proxRCA 75&65%(DES PCI), m-dRCA 95% (DES PCI), dRCA 50%-25%  ost rPDA. ostOM1 65% ISR (from 2001).    LUMBAR LAMINECTOMY/DECOMPRESSION MICRODISCECTOMY Bilateral 06/13/2018   Procedure: Laminectomy and Foraminotomy - L1-L2 - L2-L3 - L3-L4 - bilateral;  Surgeon: Eustace Moore, MD;  Location: [facility];  Service: Neurosurgery;  Laterality: Bilateral;   TRANSTHORACIC ECHOCARDIOGRAM  11/23/2017   a) 11/2017: Nl LV size & Fxn. EF 60 - 65%.  No RWMA.  GR 1 DD.  Mild RV dilation.; b) 05/2020:([facility]): Normal LV size and function.  EF 60 and 65%.  Normal RV size and function.  Aortic sclerosis with no stenosis.  No AI.  Mildly dilated ascending aorta.  CVP ~5 mm.   TRANSTHORACIC ECHOCARDIOGRAM  03/31/2021   EF 55 to 60%.  GR 1 DD.  Ascending aorta measured at 41 mm-moderate dilation.  Recommended follow-up with imaging study in 6 months to reassess.    Family History  Problem Relation Age of Onset   Heart disease Father        Pt does not know details   Stroke Neg Hx    Diabetes Neg Hx     Allergies  Allergen Reactions   Plavix [Clopidogrel Bisulfate] Swelling   Brilinta [Ticagrelor] Cough   Enalapril Other (See Comments)    Current Outpatient Medications on File Prior to Visit  Medication Sig Dispense Refill   acetaminophen (TYLENOL) 650 MG CR tablet Take 650 mg by mouth every 8 (eight) hours as needed  for pain.     Aromatic Inhalants (VICKS VAPOR INHALER IN) Inhale 1 Inhaler into the lungs as needed (stuffy nose).     cyanocobalamin (VITAMIN B12) 1000 MCG tablet Take 1,000 mcg by mouth daily.     docusate sodium (COLACE) 100 MG capsule Take 100 mg by mouth 2 (two) times daily.     doxazosin (CARDURA) 8 MG tablet Take 8 mg by mouth daily.     furosemide (LASIX) 20 MG tablet TAKE 2 TABLET EVERY OTHER DAY AND 1 TABLET ON ODD DAYS. MAY TAKE AN ADDITIONAL TABLET FOR WEIGHT GAIN OF 3LBS OVERNIGHT OR INCREASED SWELLING 200 tablet 3   losartan (COZAAR) 50 MG tablet Take 50 mg by mouth daily.     metoprolol tartrate (LOPRESSOR) 25 MG tablet Take 0.5 tablets (12.5 mg total) by mouth 2 (two) times daily. (Patient taking differently: Take 25 mg by mouth 2 (two) times daily.) 30 tablet 3   nitroGLYCERIN (NITROSTAT) 0.6 MG SL tablet Place 0.6 mg under the tongue as needed.     prasugrel (EFFIENT) 10 MG TABS tablet TAKE 1 TABLET(10 MG) BY MOUTH DAILY (Patient not taking: Reported on 10/17/2022) 90 tablet 2   rosuvastatin (CRESTOR) 20 MG tablet Take 1 tablet (20 mg total) by mouth daily. 90 tablet 3   traMADol (ULTRAM) 50 MG tablet Take by mouth every 6 (six) hours as needed.     traZODone (DESYREL) 50 MG tablet Take 50 mg by mouth at bedtime.     No current facility-administered medications on file prior to visit.    BP 136/68   Pulse 66   Temp 98 F (36.7 C)   Resp 18   Ht '5\' 10"'$  (1.778 m)   Wt 214 lb 6.4 oz (97.3 kg)   SpO2 97%   BMI 30.76 kg/m        Objective:   Physical Exam  General Mental Status- Alert. General Appearance- Not in acute distress.   Skin General: Color- Normal Color. Moisture- Normal Moisture.  Neck Carotid Arteries- Normal color. Moisture- Normal Moisture. No  carotid bruits. No JVD.  Chest and Lung Exam Auscultation: Breath Sounds:-Normal.  Cardiovascular Auscultation:Rythm- Regular. Murmurs & Other Heart Sounds:Auscultation of the heart reveals- No  Murmurs.  Abdomen Inspection:-Inspeection Normal. Palpation/Percussion:Note:No mass. Palpation and Percussion of the abdomen reveal- Non Tender, Non Distended + BS, no rebound or guarding.  Neurologic Cranial Nerve exam:- CN III-XII intact(No nystagmus), symmetric smile. Strength:- 5/5 equal and symmetric strength both upper and lower extremities.       Assessment & Plan:   Patient Instructions  Htn- continue losartan, metoprolol and lasix.  Chronic pain. Continue with Premeir. Saw pain management twice already.   Bph- continue  cardura 8 mg daily.  Hyperlipidemia- continue crestor 20 mg daily.  Insomnia- appears related to lower extremity neuropathy. Glad to her that you repot  trazadone helps with your lower ext nerve pain. Typically rx'd for insomnia.  For lower ext neuropathic type pain. Continue tramadol with pain management.   Get cmp, lipid panel and A1c.  Follow up date to be determined after lab review. At least every 6 months but possibly sooner.   Mackie Pai, PA-C   Time spent with patient today was 40 minutes which consisted of chart revdiew, discussing diagnosis, work up treatment and documentation.

## 2022-11-13 NOTE — Addendum Note (Signed)
Addended by: Anabel Halon on: 11/13/2022 01:49 PM   Modules accepted: Orders

## 2022-11-14 LAB — COMPREHENSIVE METABOLIC PANEL
ALT: 10 U/L (ref 0–53)
AST: 15 U/L (ref 0–37)
Albumin: 3.8 g/dL (ref 3.5–5.2)
Alkaline Phosphatase: 72 U/L (ref 39–117)
BUN: 20 mg/dL (ref 6–23)
CO2: 30 mEq/L (ref 19–32)
Calcium: 9 mg/dL (ref 8.4–10.5)
Chloride: 101 mEq/L (ref 96–112)
Creatinine, Ser: 0.86 mg/dL (ref 0.40–1.50)
GFR: 79.68 mL/min (ref >60.00)
Glucose, Bld: 99 mg/dL (ref 70–99)
Potassium: 3.9 mEq/L (ref 3.5–5.1)
Sodium: 140 mEq/L (ref 135–145)
Total Bilirubin: 0.4 mg/dL (ref 0.2–1.2)
Total Protein: 6.5 g/dL (ref 6.0–8.3)

## 2022-11-14 LAB — LIPID PANEL
Cholesterol: 112 mg/dL (ref 0–200)
HDL: 41.3 mg/dL (ref >39.00)
LDL Cholesterol: 58 mg/dL (ref 0–99)
NonHDL: 70.52
Total CHOL/HDL Ratio: 3
Triglycerides: 62 mg/dL (ref 0.0–149.0)
VLDL: 12.4 mg/dL (ref 0.0–40.0)

## 2022-11-14 LAB — HEMOGLOBIN A1C: Hgb A1c MFr Bld: 6.2 % (ref 4.6–6.5)

## 2022-11-25 ENCOUNTER — Other Ambulatory Visit: Payer: Self-pay | Admitting: Cardiology

## 2022-12-03 ENCOUNTER — Inpatient Hospital Stay: Payer: Medicare Other | Attending: Hematology & Oncology

## 2022-12-03 ENCOUNTER — Inpatient Hospital Stay (HOSPITAL_BASED_OUTPATIENT_CLINIC_OR_DEPARTMENT_OTHER): Payer: Medicare Other | Admitting: Medical Oncology

## 2022-12-03 VITALS — BP 137/78 | HR 70 | Temp 98.0°F | Resp 18 | Ht 70.0 in | Wt 215.0 lb

## 2022-12-03 DIAGNOSIS — G8929 Other chronic pain: Secondary | ICD-10-CM | POA: Insufficient documentation

## 2022-12-03 DIAGNOSIS — M549 Dorsalgia, unspecified: Secondary | ICD-10-CM | POA: Insufficient documentation

## 2022-12-03 DIAGNOSIS — D509 Iron deficiency anemia, unspecified: Secondary | ICD-10-CM | POA: Diagnosis not present

## 2022-12-03 DIAGNOSIS — D500 Iron deficiency anemia secondary to blood loss (chronic): Secondary | ICD-10-CM

## 2022-12-03 LAB — CMP (CANCER CENTER ONLY)
ALT: 10 U/L (ref 0–44)
AST: 17 U/L (ref 15–41)
Albumin: 4.4 g/dL (ref 3.5–5.0)
Alkaline Phosphatase: 72 U/L (ref 38–126)
Anion gap: 8 (ref 5–15)
BUN: 23 mg/dL (ref 8–23)
CO2: 33 mmol/L — ABNORMAL HIGH (ref 22–32)
Calcium: 9.6 mg/dL (ref 8.9–10.3)
Chloride: 101 mmol/L (ref 98–111)
Creatinine: 1.13 mg/dL (ref 0.61–1.24)
GFR, Estimated: >60 mL/min (ref >60)
Glucose, Bld: 107 mg/dL — ABNORMAL HIGH (ref 70–99)
Potassium: 4.6 mmol/L (ref 3.5–5.1)
Sodium: 142 mmol/L (ref 135–145)
Total Bilirubin: 0.5 mg/dL (ref 0.3–1.2)
Total Protein: 7.4 g/dL (ref 6.5–8.1)

## 2022-12-03 LAB — CBC WITH DIFFERENTIAL (CANCER CENTER ONLY)
Abs Immature Granulocytes: 0.09 10*3/uL — ABNORMAL HIGH (ref 0.00–0.07)
Basophils Absolute: 0 10*3/uL (ref 0.0–0.1)
Basophils Relative: 1 %
Eosinophils Absolute: 0.1 10*3/uL (ref 0.0–0.5)
Eosinophils Relative: 2 %
HCT: 41 % (ref 39.0–52.0)
Hemoglobin: 13.3 g/dL (ref 13.0–17.0)
Immature Granulocytes: 2 %
Lymphocytes Relative: 16 %
Lymphs Abs: 0.9 10*3/uL (ref 0.7–4.0)
MCH: 30.7 pg (ref 26.0–34.0)
MCHC: 32.4 g/dL (ref 30.0–36.0)
MCV: 94.7 fL (ref 80.0–100.0)
Monocytes Absolute: 0.6 10*3/uL (ref 0.1–1.0)
Monocytes Relative: 11 %
Neutro Abs: 3.8 10*3/uL (ref 1.7–7.7)
Neutrophils Relative %: 68 %
Platelet Count: 203 10*3/uL (ref 150–400)
RBC: 4.33 MIL/uL (ref 4.22–5.81)
RDW: 13 % (ref 11.5–15.5)
WBC Count: 5.6 10*3/uL (ref 4.0–10.5)
nRBC: 0 % (ref 0.0–0.2)

## 2022-12-03 LAB — RETICULOCYTES
Immature Retic Fract: 10 % (ref 2.3–15.9)
RBC.: 4.37 MIL/uL (ref 4.22–5.81)
Retic Count, Absolute: 59.4 10*3/uL (ref 19.0–186.0)
Retic Ct Pct: 1.4 % (ref 0.4–3.1)

## 2022-12-03 LAB — IRON AND IRON BINDING CAPACITY (CC-WL,HP ONLY)
Iron: 72 ug/dL (ref 45–182)
Saturation Ratios: 28 % (ref 17.9–39.5)
TIBC: 262 ug/dL (ref 250–450)
UIBC: 190 ug/dL (ref 117–376)

## 2022-12-03 LAB — FERRITIN: Ferritin: 719 ng/mL — ABNORMAL HIGH (ref 24–336)

## 2022-12-03 NOTE — Progress Notes (Signed)
Hematology and Oncology Follow Up Visit  Noah Scott [id number] Feb 06, 1936 87 y.o. 12/03/2022   Principle Diagnosis:  Iron deficiency anemia  Current Therapy:   IV iron given-Monoferric-on  04/02/2022     Interim History:  Noah Scott is back for his follow-up.  He continues to have chronic back pain. He discusses his frustration with getting his chronic pain under better control. He is seen by a pain specialist.   When we last saw him back in December, his ferritin was 878 with an iron saturation of 33%.  He has had no change in bowel or bladder habits.  There is been no melena or bright red blood per rectum.  He has had no cough or shortness of breath.  He has had no fever.  There is been no rashes.  He has had no leg swelling.  Overall, I would say that his performance status is probably ECOG 1.  Wt Readings from Last 3 Encounters:  12/03/22 215 lb (97.5 kg)  11/13/22 214 lb 6.4 oz (97.3 kg)  10/17/22 208 lb (94.3 kg)     Medications:  Current Outpatient Medications:    acetaminophen (TYLENOL) 650 MG CR tablet, Take 650 mg by mouth every 8 (eight) hours as needed for pain., Disp: , Rfl:    Aromatic Inhalants (VICKS VAPOR INHALER IN), Inhale 1 Inhaler into the lungs as needed (stuffy nose)., Disp: , Rfl:    cyanocobalamin (VITAMIN B12) 1000 MCG tablet, Take 1,000 mcg by mouth daily., Disp: , Rfl:    docusate sodium (COLACE) 100 MG capsule, Take 100 mg by mouth 2 (two) times daily., Disp: , Rfl:    doxazosin (CARDURA) 8 MG tablet, Take 8 mg by mouth daily., Disp: , Rfl:    doxycycline (DORYX) 100 MG EC tablet, Take 100 mg by mouth 2 (two) times daily., Disp: , Rfl:    furosemide (LASIX) 20 MG tablet, TAKE 2 TABLET EVERY OTHER DAY AND 1 TABLET ON ODD DAYS. MAY TAKE AN ADDITIONAL TABLET FOR WEIGHT GAIN OF 3LBS OVERNIGHT OR INCREASED SWELLING, Disp: 200 tablet, Rfl: 3   glimepiride (AMARYL) 2 MG tablet, Take 2 mg by mouth daily with breakfast., Disp: , Rfl:    losartan (COZAAR) 50  MG tablet, Take 50 mg by mouth daily., Disp: , Rfl:    metoprolol tartrate (LOPRESSOR) 25 MG tablet, Take 0.5 tablets (12.5 mg total) by mouth 2 (two) times daily. (Patient taking differently: Take 25 mg by mouth 2 (two) times daily.), Disp: 30 tablet, Rfl: 3   nitroGLYCERIN (NITROSTAT) 0.6 MG SL tablet, Place 0.6 mg under the tongue as needed., Disp: , Rfl:    prasugrel (EFFIENT) 10 MG TABS tablet, TAKE 1 TABLET(10 MG) BY MOUTH DAILY, Disp: 90 tablet, Rfl: 2   predniSONE (DELTASONE) 20 MG tablet, Take 20 mg by mouth daily with breakfast., Disp: , Rfl:    rosuvastatin (CRESTOR) 20 MG tablet, Take 1 tablet (20 mg total) by mouth daily., Disp: 90 tablet, Rfl: 3  Allergies:  Allergies  Allergen Reactions   Plavix [Clopidogrel Bisulfate] Swelling   Brilinta [Ticagrelor] Cough   Enalapril Other (See Comments)    Past Medical History, Surgical history, Social history, and Family History were reviewed and updated.  Review of Systems: Review of Systems  Constitutional:  Negative for unexpected weight change.  HENT:  Negative.    Eyes: Negative.   Respiratory: Negative.    Cardiovascular: Negative.   Gastrointestinal: Negative.   Endocrine: Negative.   Genitourinary: Negative.  Musculoskeletal: Negative.   Skin: Negative.   Neurological: Negative.   Hematological: Negative.   Psychiatric/Behavioral: Negative.      Physical Exam:  height is 5\' 10"  (1.778 m) and weight is 215 lb (97.5 kg). His oral temperature is 98 F (36.7 C). His blood pressure is 137/78 and his pulse is 70. His respiration is 18 and oxygen saturation is 98%.   Wt Readings from Last 3 Encounters:  12/03/22 215 lb (97.5 kg)  11/13/22 214 lb 6.4 oz (97.3 kg)  10/17/22 208 lb (94.3 kg)    Physical Exam Vitals reviewed.  Constitutional:      Comments: Gently using a cane.  HENT:     Head: Normocephalic and atraumatic.  Eyes:     Pupils: Pupils are equal, round, and reactive to light.  Cardiovascular:      Rate and Rhythm: Normal rate and regular rhythm.     Heart sounds: Normal heart sounds.  Pulmonary:     Effort: Pulmonary effort is normal.     Breath sounds: Normal breath sounds.  Abdominal:     General: Bowel sounds are normal.     Palpations: Abdomen is soft.  Musculoskeletal:        General: No tenderness or deformity. Normal range of motion.     Cervical back: Normal range of motion.  Lymphadenopathy:     Cervical: No cervical adenopathy.  Skin:    General: Skin is warm and dry.     Findings: No erythema or rash.  Neurological:     Mental Status: He is alert and oriented to person, place, and time.  Psychiatric:        Behavior: Behavior normal.        Thought Content: Thought content normal.        Judgment: Judgment normal.      Lab Results  Component Value Date   WBC 5.6 12/03/2022   HGB 13.3 12/03/2022   HCT 41.0 12/03/2022   MCV 94.7 12/03/2022   PLT 203 12/03/2022     Chemistry      Component Value Date/Time   NA 142 12/03/2022 1130   NA 142 11/21/2021 0901   K 4.6 12/03/2022 1130   CL 101 12/03/2022 1130   CO2 33 (H) 12/03/2022 1130   BUN 23 12/03/2022 1130   BUN 12 11/21/2021 0901   CREATININE 1.13 12/03/2022 1130      Component Value Date/Time   CALCIUM 9.6 12/03/2022 1130   ALKPHOS 72 12/03/2022 1130   AST 17 12/03/2022 1130   ALT 10 12/03/2022 1130   BILITOT 0.5 12/03/2022 1130     Impression and Plan: Noah Scott is a very nice 87 year old white male.  He has recurrent iron deficiency anemia.  He last got IV iron back in July.    Today his Hgb is 13.3 which is up from 12.5. MCV is 94.7, platelets are 203. Iron studies are pending. He does not need additional iron today.   I listened to his frustrations regarding his back pain. I have encouraged him to keep an open dialogue with his specialist so they can better try to treat his pain.    RTC 3 months MA, labs     Hughie Closs, [place] 3/18/202412:39 PM

## 2022-12-05 ENCOUNTER — Telehealth: Payer: Self-pay | Admitting: Cardiology

## 2022-12-05 NOTE — Telephone Encounter (Signed)
*  STAT* If patient is at the pharmacy, call can be transferred to refill team.   1. Which medications need to be refilled? (please list name of each medication and dose if known) losartan (COZAAR) 50 MG tablet   2. Which pharmacy/location (including street and city if local pharmacy) is medication to be sent to?  [id number] - [place], [place] - [address]    3. Do they need a 30 day or 90 day supply? 30 day

## 2022-12-07 ENCOUNTER — Other Ambulatory Visit: Payer: Self-pay | Admitting: Cardiology

## 2022-12-07 MED ORDER — LOSARTAN POTASSIUM 50 MG PO TABS: 50.0000 mg | ORAL_TABLET | Freq: Every day | ORAL | 0 refills | Status: DC

## 2022-12-07 NOTE — Telephone Encounter (Signed)
30 day supply sent to [facility].  Pt needs an appointment.

## 2022-12-10 ENCOUNTER — Telehealth: Payer: Self-pay | Admitting: Medical

## 2022-12-10 MED ORDER — ROSUVASTATIN CALCIUM 20 MG PO TABS: 20.0000 mg | ORAL_TABLET | Freq: Every day | ORAL | 3 refills | Status: DC

## 2022-12-10 NOTE — Telephone Encounter (Signed)
Rx sent 

## 2022-12-10 NOTE — Addendum Note (Signed)
Addended by: Jeronimo Greaves on: 12/10/2022 10:04 AM   Modules accepted: Orders

## 2022-12-10 NOTE — Telephone Encounter (Signed)
Prescription Request  12/10/2022  Is this a "Controlled Substance" medicine? No  LOV: 11/13/2022  What is the name of the medication or equipment?   rosuvastatin (CRESTOR) 20 MG tablet [id number]   Have you contacted your pharmacy to request a refill? No   Which pharmacy would you like this sent to?  [id number] - [place], [place] - [address] [place] [address] Phone: [phone] Fax: [phone]    Patient notified that their request is being sent to the clinical staff for review and that they should receive a response within 2 business days.   Please advise at Mobile [phone] (mobile)

## 2022-12-24 ENCOUNTER — Telehealth: Payer: Self-pay | Admitting: Medical

## 2022-12-24 MED ORDER — ROSUVASTATIN CALCIUM 20 MG PO TABS: 20.0000 mg | ORAL_TABLET | Freq: Every day | ORAL | 3 refills | Status: DC

## 2022-12-24 NOTE — Telephone Encounter (Signed)
Medication: losartan (COZAAR) 50 MG tablet  Has the patient contacted their pharmacy? No.   Preferred Pharmacy:   [id number] - [place], [place] - [address] AT [facility] OF [address], [place] [address] Phone: [phone]  Fax: [phone]

## 2022-12-24 NOTE — Telephone Encounter (Signed)
Error-  He is needing rosuvastatin.

## 2022-12-24 NOTE — Telephone Encounter (Signed)
Rx sent 

## 2023-01-03 ENCOUNTER — Other Ambulatory Visit: Payer: Self-pay | Admitting: Cardiology

## 2023-02-14 ENCOUNTER — Telehealth: Payer: Self-pay

## 2023-02-14 NOTE — Telephone Encounter (Signed)
Pt is requesting a referral to pain management. States that he was told by is current pain management to find a new one.

## 2023-02-18 NOTE — Telephone Encounter (Signed)
Appt made for 02/22/23

## 2023-02-22 ENCOUNTER — Ambulatory Visit (INDEPENDENT_AMBULATORY_CARE_PROVIDER_SITE_OTHER): Payer: Medicare Other | Admitting: Medical

## 2023-02-22 VITALS — BP 136/68 | HR 60 | Resp 18 | Ht 70.0 in | Wt 215.0 lb

## 2023-02-22 DIAGNOSIS — Z79899 Other long term (current) drug therapy: Secondary | ICD-10-CM

## 2023-02-22 DIAGNOSIS — G8929 Other chronic pain: Secondary | ICD-10-CM

## 2023-02-22 DIAGNOSIS — L603 Nail dystrophy: Secondary | ICD-10-CM | POA: Diagnosis not present

## 2023-02-22 DIAGNOSIS — G894 Chronic pain syndrome: Secondary | ICD-10-CM | POA: Diagnosis not present

## 2023-02-22 DIAGNOSIS — M549 Dorsalgia, unspecified: Secondary | ICD-10-CM | POA: Diagnosis not present

## 2023-02-22 NOTE — Addendum Note (Signed)
Addended by: Maximino Sarin on: 02/22/2023 01:38 PM   Modules accepted: Orders

## 2023-02-22 NOTE — Patient Instructions (Signed)
1. Chronic bilateral back pain, unspecified back location/Pain syndrome Will do contract and uds today. When drug screen back and if clear then rx tramadol 50 mg #90 tid prn pain. - Ambulatory referral to Pain Clinic Rules and regulation explained. Goal is  to provide you med pending the referral.   2. Dystrophic nail - Ambulatory referral to Podiatry   Follow up date one month after starting tramadol.

## 2023-02-22 NOTE — Progress Notes (Signed)
Subjective:    Patient ID: Noah Scott, male    DOB: 11-Mar-1936, 87 y.o.   MRN: [id number]  HPI  Pt in for follow up.  HPI from pain management.  "[name] is a 87 y.o. old male with pMH significant for CAD, HTN, Gout and chronic pain.   01/16/2023 [name] returns for pharmacological management and follow-up of his chronic lower back and bilateral lower extremity pain. We have failed to make any significant impact on his chronic pain despite medication management. He has been using tramadol 50 mg up to 4 times daily and over-the-counter Tylenol. He states that it makes very little impact on his pain and function. He also tried tizanidine that was prescribed at his last visit without success. We discussed alternative medicines and the goals of pharmacological management. I did advise him that without significant improvement with opioid management we would likely not continue to prescribe the medications. He denies side effects associated with his pain medications. He denies any new symptoms or new medical problems.    IMPRESSION:  1. Multilevel spondylosis of the lumbar spine as described.  2. Mild right foraminal narrowing at T12-L1.  3. Mild subarticular and foraminal narrowing bilaterally at L1-2.  4. Moderate left and mild right subarticular narrowing at L2-3 with  moderate foraminal stenosis bilaterally, worse on the left.  5. Mild subarticular and moderate bilateral foraminal stenosis at  L3-4.  6. Moderate right and mild left subarticular narrowing with moderate  foraminal narrowing bilaterally at L4-5.  7. Moderate right and mild left subarticular narrowing with mild  foraminal narrowing bilaterally at L5-S1.    Pt states his pain is in low back and lower extremities.    Pt states pain management stopped his tramadol and gave him buprenorphine.   "Chronic pain syndrome - buprenorphine (BUTRANS) 10 mcg/hour ptwk patch; Place 1 patch on the skin every 7  days.  Postlaminectomy syndrome"   Pt states tramadol takes edge of pain just little bit. He states buprenorphine does not help much. He states his pain level varies from day to day. Pt seems to think that pain management made decision on most one day report of severe pain on day he was seen.   Pt only used patch for one months. Last patch he applied was Sunday a week ago.   (On average since not on anything but tylenol 9/10.)  (On average when on tramadol pain level is 8/10 on average)   Pain tells me that he was overall satisfied with pain control. Pt explains even though only one point reduction on pain scale he states he was satisfied.  Pt know wants to be referred to new pain management. He no longer going to use patch and he indicates not going return.  He states quality of his sleep is better with tramadol. No excess sedation during the day.    Review of Systems  Constitutional:  Negative for chills, fatigue and fever.  Respiratory:  Negative for cough, shortness of breath and wheezing.   Cardiovascular:  Negative for chest pain and palpitations.  Gastrointestinal:  Negative for abdominal pain, blood in stool, constipation and diarrhea.  Musculoskeletal:  Positive for back pain. Negative for myalgias and neck pain.  Skin:  Negative for rash.  Psychiatric/Behavioral:  Negative for behavioral problems, confusion, hallucinations and sleep disturbance. The patient is not nervous/anxious.      Past Medical History:  Diagnosis Date   CAD S/P percutaneous coronary angioplasty 2001   a) 2001 (  [place]): PCI OM1 - now with ~65% ISR.;; b) NSTEMI 11/2017 - ost-prox RCA 75-65% (Overlapping ONYX DES 3.0 x 26 & 3.0 x 8 --> 3.5-3.3 mm). m-dRCA 95% (ONYX DES 2.75 x 18 mm -> 3.0 mm). Ost OM1 65% ISR (med Rx). dRCA-OstRDA (50%-25%)   Essential (primary) hypertension 11/21/2017   Hyperlipidemia 11/21/2017   Iron deficiency anemia due to chronic blood loss 08/08/2021   Iron  malabsorption 08/08/2021   Neurogenic claudication due to lumbar spinal stenosis 02/06/2018   NSTEMI (non-ST elevated myocardial infarction) (HCC) 11/21/2017   Multiple RCA lesions - 2 Overlapping DES Ost-Prox RCA & 1 in mid-distal RCA   Spinal stenosis of lumbar region    Venous reflux 11/2020   Venous Dopplers 12/15/2020: No DVT or superficial venous thrombosis bilaterally.  Venous reflux noted in bilateral FV and saphenofemoral junctions, R GSV in the thigh and R SFV, left proximal thigh GSV. -->  No invasive options due to deep and superficial venous reflux.     Social History   Socioeconomic History   Marital status: Widowed    Spouse name: Not on file   Number of children: 1   Years of education: 43   Highest education level: Not on file  Occupational History   Occupation: retired Geologist, engineering  Tobacco Use   Smoking status: Former    Types: Cigarettes    Quit date: 03/02/1969    Years since quitting: 54.0   Smokeless tobacco: Never  Vaping Use   Vaping Use: Never used  Substance and Sexual Activity   Alcohol use: Not Currently   Drug use: No   Sexual activity: Not on file  Other Topics Concern   Not on file  Social History Narrative   Lives alone in an apartment on the first floor.  Has one daughter.  Retired Geologist, engineering.  Education: some college.    Social Determinants of Health   Financial Resource Strain: Low Risk  (10/17/2022)   Overall Financial Resource Strain (CARDIA)    Difficulty of Paying Living Expenses: Not hard at all  Food Insecurity: No Food Insecurity (10/17/2022)   Hunger Vital Sign    Worried About Running Out of Food in the Last Year: Never true    Ran Out of Food in the Last Year: Never true  Transportation Needs: No Transportation Needs (10/17/2022)   PRAPARE - Administrator, Civil Service (Medical): No    Lack of Transportation (Non-Medical): No  Physical Activity: Insufficiently Active (10/17/2022)   Exercise  Vital Sign    Days of Exercise per Week: 7 days    Minutes of Exercise per Session: 10 min  Stress: No Stress Concern Present (10/17/2022)   [employer] of Occupational Health - Occupational Stress Questionnaire    Feeling of Stress : Not at all  Social Connections: Moderately Isolated (10/17/2022)   Social Connection and Isolation Panel [NHANES]    Frequency of Communication with Friends and Family: More than three times a week    Frequency of Social Gatherings with Friends and Family: More than three times a week    Attends Religious Services: Never    Database administrator or Organizations: Yes    Attends Banker Meetings: 1 to 4 times per year    Marital Status: Widowed  Intimate Partner Violence: Not At Risk (10/17/2022)   Humiliation, Afraid, Rape, and Kick questionnaire    Fear of Current or Ex-Partner: No    Emotionally Abused: No  Physically Abused: No    Sexually Abused: No    Past Surgical History:  Procedure Laterality Date   CORONARY STENT INTERVENTION N/A 11/25/2017   Procedure: CORONARY STENT INTERVENTION;  Surgeon: Marykay Lex, MD;  Location: [facility] INVASIVE CV LAB;  Service: Cardiovascular:   m-dRCA 95%  (DES PCI - 0%.  Resolute ONYX DES 2.75 x 18 mm - ~3.0 mm). Ost-proxRCA 75-65% (DES PCI-0%. 2 overlapping Resolute ONYX DES 3.0 x 26 & 3.0 x 8 mm --> 3.5-3.3 mm)   CORONARY STENT INTERVENTION  2001   ([place]): PCI OM1   LEA DOPPLERS  12/12/2017   Normal bilateral ABIs and TBI's.   LEFT HEART CATH AND CORONARY ANGIOGRAPHY N/A 11/25/2017   Procedure: LEFT HEART CATH AND CORONARY ANGIOGRAPHY;  Surgeon: Marykay Lex, MD;  Location: [facility] INVASIVE CV LAB;  Service: Cardiovascular:  Ost-proxRCA 75&65%(DES PCI), m-dRCA 95% (DES PCI), dRCA 50%-25% ost rPDA. ostOM1 65% ISR (from 2001).    LUMBAR LAMINECTOMY/DECOMPRESSION MICRODISCECTOMY Bilateral 06/13/2018   Procedure: Laminectomy and Foraminotomy - L1-L2 - L2-L3 - L3-L4 - bilateral;  Surgeon: Tia Alert, MD;  Location: [facility] OR;  Service: Neurosurgery;  Laterality: Bilateral;   TRANSTHORACIC ECHOCARDIOGRAM  11/23/2017   a) 11/2017: Nl LV size & Fxn. EF 60 - 65%.  No RWMA.  GR 1 DD.  Mild RV dilation.; b) 05/2020:([facility]): Normal LV size and function.  EF 60 and 65%.  Normal RV size and function.  Aortic sclerosis with no stenosis.  No AI.  Mildly dilated ascending aorta.  CVP ~5 mm.   TRANSTHORACIC ECHOCARDIOGRAM  03/31/2021   EF 55 to 60%.  GR 1 DD.  Ascending aorta measured at 41 mm-moderate dilation.  Recommended follow-up with imaging study in 6 months to reassess.    Family History  Problem Relation Age of Onset   Heart disease Father        Pt does not know details   Stroke Neg Hx    Diabetes Neg Hx     Allergies  Allergen Reactions   Plavix [Clopidogrel Bisulfate] Swelling   Brilinta [Ticagrelor] Cough   Enalapril Other (See Comments)    Current Outpatient Medications on File Prior to Visit  Medication Sig Dispense Refill   acetaminophen (TYLENOL) 650 MG CR tablet Take 650 mg by mouth every 8 (eight) hours as needed for pain.     Aromatic Inhalants (VICKS VAPOR INHALER IN) Inhale 1 Inhaler into the lungs as needed (stuffy nose).     cyanocobalamin (VITAMIN B12) 1000 MCG tablet Take 1,000 mcg by mouth daily.     docusate sodium (COLACE) 100 MG capsule Take 100 mg by mouth 2 (two) times daily.     doxazosin (CARDURA) 8 MG tablet Take 8 mg by mouth daily.     doxycycline (DORYX) 100 MG EC tablet Take 100 mg by mouth 2 (two) times daily.     furosemide (LASIX) 20 MG tablet TAKE 2 TABLET EVERY OTHER DAY AND 1 TABLET ON ODD DAYS. MAY TAKE AN ADDITIONAL TABLET FOR WEIGHT GAIN OF 3LBS OVERNIGHT OR INCREASED SWELLING 200 tablet 3   glimepiride (AMARYL) 2 MG tablet Take 2 mg by mouth daily with breakfast.     losartan (COZAAR) 50 MG tablet TAKE 1 TABLET(50 MG) BY MOUTH DAILY 30 tablet 0   metoprolol tartrate (LOPRESSOR) 25 MG tablet Take 0.5 tablets (12.5 mg total) by mouth 2 (two)  times daily. (Patient taking differently: Take 25 mg by mouth 2 (two) times daily.) 30  tablet 3   nitroGLYCERIN (NITROSTAT) 0.6 MG SL tablet Place 0.6 mg under the tongue as needed.     prasugrel (EFFIENT) 10 MG TABS tablet TAKE 1 TABLET(10 MG) BY MOUTH DAILY 90 tablet 2   predniSONE (DELTASONE) 20 MG tablet Take 20 mg by mouth daily with breakfast.     rosuvastatin (CRESTOR) 20 MG tablet Take 1 tablet (20 mg total) by mouth daily. 90 tablet 3   No current facility-administered medications on file prior to visit.    BP 136/68   Pulse 60   Resp 18   Ht 5\' 10"  (1.778 m)   Wt 215 lb (97.5 kg)   SpO2 99%   BMI 30.85 kg/m         Objective:   Physical Exam  General Mental Status- Alert. General Appearance- Not in acute distress.   Skin General: Color- Normal Color. Moisture- Normal Moisture.  Neck Carotid Arteries- Normal color. Moisture- Normal Moisture. No carotid bruits. No JVD.  Chest and Lung Exam Auscultation: Breath Sounds:-Normal.  Cardiovascular Auscultation:Rythm- Regular. Murmurs & Other Heart Sounds:Auscultation of the heart reveals- No Murmurs.  Abdomen Inspection:-Inspeection Normal. Palpation/Percussion:Note:No mass. Palpation and Percussion of the abdomen reveal- Non Tender, Non Distended + BS, no rebound or guarding.  Neurologic Cranial Nerve exam:- CN III-XII intact(No nystagmus), symmetric smile. Strength:- 5/5 equal and symmetric strength both upper and lower extremities.   Feet- thick distrophic nails bilaterally.     Assessment & Plan:   Patient Instructions  1. Chronic bilateral back pain, unspecified back location/Pain syndrome Will do contract and uds today. When drug screen back and if clear then rx tramadol 50 mg #90 tid prn pain. - Ambulatory referral to Pain Clinic Rules and regulation explained. Goal is  to provide you med pending the referral.   2. Dystrophic nail - Ambulatory referral to Podiatry   Follow up date one month  after starting tramadol.    Esperanza Richters, PA-C

## 2023-02-25 LAB — DRUG MONITORING PANEL 376104, URINE
Amphetamines: NEGATIVE ng/mL (ref <500)
Barbiturates: NEGATIVE ng/mL (ref <300)
Benzodiazepines: NEGATIVE ng/mL (ref <100)
Cocaine Metabolite: NEGATIVE ng/mL (ref <150)
Desmethyltramadol: 9743 ng/mL — ABNORMAL HIGH (ref <100)
Opiates: NEGATIVE ng/mL (ref <100)
Oxycodone: NEGATIVE ng/mL (ref <100)
Tramadol: 6149 ng/mL — ABNORMAL HIGH (ref <100)

## 2023-02-25 LAB — DM TEMPLATE

## 2023-03-04 ENCOUNTER — Telehealth: Payer: Self-pay

## 2023-03-04 NOTE — Telephone Encounter (Signed)
Pt signed contract at time of last visit , labs were reviewed with pt

## 2023-03-05 ENCOUNTER — Inpatient Hospital Stay: Payer: Medicare Other | Attending: Hematology & Oncology

## 2023-03-05 ENCOUNTER — Other Ambulatory Visit: Payer: Self-pay

## 2023-03-05 ENCOUNTER — Inpatient Hospital Stay (HOSPITAL_BASED_OUTPATIENT_CLINIC_OR_DEPARTMENT_OTHER): Payer: Medicare Other | Admitting: Medical Oncology

## 2023-03-05 ENCOUNTER — Encounter: Payer: Self-pay | Admitting: Medical Oncology

## 2023-03-05 VITALS — BP 118/67 | HR 66 | Temp 98.7°F | Resp 18 | Ht 70.0 in | Wt 214.4 lb

## 2023-03-05 DIAGNOSIS — D509 Iron deficiency anemia, unspecified: Secondary | ICD-10-CM | POA: Insufficient documentation

## 2023-03-05 DIAGNOSIS — Z7984 Long term (current) use of oral hypoglycemic drugs: Secondary | ICD-10-CM | POA: Insufficient documentation

## 2023-03-05 DIAGNOSIS — D500 Iron deficiency anemia secondary to blood loss (chronic): Secondary | ICD-10-CM

## 2023-03-05 DIAGNOSIS — Z79899 Other long term (current) drug therapy: Secondary | ICD-10-CM | POA: Diagnosis not present

## 2023-03-05 DIAGNOSIS — Z7952 Long term (current) use of systemic steroids: Secondary | ICD-10-CM | POA: Diagnosis not present

## 2023-03-05 DIAGNOSIS — Z7902 Long term (current) use of antithrombotics/antiplatelets: Secondary | ICD-10-CM | POA: Insufficient documentation

## 2023-03-05 DIAGNOSIS — K909 Intestinal malabsorption, unspecified: Secondary | ICD-10-CM

## 2023-03-05 LAB — CMP (CANCER CENTER ONLY)
ALT: 17 U/L (ref 0–44)
AST: 21 U/L (ref 15–41)
Albumin: 3.7 g/dL (ref 3.5–5.0)
Alkaline Phosphatase: 67 U/L (ref 38–126)
Anion gap: 9 (ref 5–15)
BUN: 22 mg/dL (ref 8–23)
CO2: 28 mmol/L (ref 22–32)
Calcium: 9 mg/dL (ref 8.9–10.3)
Chloride: 103 mmol/L (ref 98–111)
Creatinine: 1.05 mg/dL (ref 0.61–1.24)
GFR, Estimated: >60 mL/min (ref >60)
Glucose, Bld: 105 mg/dL — ABNORMAL HIGH (ref 70–99)
Potassium: 4.5 mmol/L (ref 3.5–5.1)
Sodium: 140 mmol/L (ref 135–145)
Total Bilirubin: 0.4 mg/dL (ref 0.3–1.2)
Total Protein: 7.2 g/dL (ref 6.5–8.1)

## 2023-03-05 LAB — RETICULOCYTES
Immature Retic Fract: 10.6 % (ref 2.3–15.9)
RBC.: 4.1 MIL/uL — ABNORMAL LOW (ref 4.22–5.81)
Retic Count, Absolute: 59.5 10*3/uL (ref 19.0–186.0)
Retic Ct Pct: 1.5 % (ref 0.4–3.1)

## 2023-03-05 LAB — CBC WITH DIFFERENTIAL (CANCER CENTER ONLY)
Abs Immature Granulocytes: 0.02 10*3/uL (ref 0.00–0.07)
Basophils Absolute: 0 10*3/uL (ref 0.0–0.1)
Basophils Relative: 0 %
Eosinophils Absolute: 0.1 10*3/uL (ref 0.0–0.5)
Eosinophils Relative: 2 %
HCT: 38.7 % — ABNORMAL LOW (ref 39.0–52.0)
Hemoglobin: 12.4 g/dL — ABNORMAL LOW (ref 13.0–17.0)
Immature Granulocytes: 0 %
Lymphocytes Relative: 15 %
Lymphs Abs: 0.9 10*3/uL (ref 0.7–4.0)
MCH: 30.3 pg (ref 26.0–34.0)
MCHC: 32 g/dL (ref 30.0–36.0)
MCV: 94.6 fL (ref 80.0–100.0)
Monocytes Absolute: 0.7 10*3/uL (ref 0.1–1.0)
Monocytes Relative: 11 %
Neutro Abs: 4.5 10*3/uL (ref 1.7–7.7)
Neutrophils Relative %: 72 %
Platelet Count: 204 10*3/uL (ref 150–400)
RBC: 4.09 MIL/uL — ABNORMAL LOW (ref 4.22–5.81)
RDW: 13.6 % (ref 11.5–15.5)
WBC Count: 6.3 10*3/uL (ref 4.0–10.5)
nRBC: 0 % (ref 0.0–0.2)

## 2023-03-05 LAB — IRON AND IRON BINDING CAPACITY (CC-WL,HP ONLY)
Iron: 60 ug/dL (ref 45–182)
Saturation Ratios: 24 % (ref 17.9–39.5)
TIBC: 252 ug/dL (ref 250–450)
UIBC: 192 ug/dL (ref 117–376)

## 2023-03-05 LAB — FERRITIN: Ferritin: 797 ng/mL — ABNORMAL HIGH (ref 24–336)

## 2023-03-05 NOTE — Progress Notes (Signed)
Hematology and Oncology Follow Up Visit  Noah Scott [id number] 1935/09/19 87 y.o. 03/05/2023   Principle Diagnosis:  Iron deficiency anemia  Current Therapy:   IV iron given-Monoferric-on  04/02/2022     Interim History:  Mr. Argudo is back for his follow-up.    He reports troubles with pain management and his chronic pain. Pain is due to his chronic osteoarthritis.   When we last saw him back in December, his ferritin was 719 with an iron saturation of 28%.  He has had no change in bowel or bladder habits.  There is been no melena or bright red blood per rectum.  He has had no cough or shortness of breath.  He has had no fever.  There is been no rashes.  He has had no leg swelling.  Overall, I would say that his performance status is probably ECOG 1.  Wt Readings from Last 3 Encounters:  03/05/23 214 lb 6.4 oz (97.3 kg)  02/22/23 215 lb (97.5 kg)  12/03/22 215 lb (97.5 kg)     Medications:  Current Outpatient Medications:    acetaminophen (TYLENOL) 650 MG CR tablet, Take 650 mg by mouth every 8 (eight) hours as needed for pain., Disp: , Rfl:    Aromatic Inhalants (VICKS VAPOR INHALER IN), Inhale 1 Inhaler into the lungs as needed (stuffy nose)., Disp: , Rfl:    cyanocobalamin (VITAMIN B12) 1000 MCG tablet, Take 1,000 mcg by mouth daily., Disp: , Rfl:    docusate sodium (COLACE) 100 MG capsule, Take 100 mg by mouth 2 (two) times daily., Disp: , Rfl:    doxazosin (CARDURA) 8 MG tablet, Take 8 mg by mouth daily., Disp: , Rfl:    doxycycline (DORYX) 100 MG EC tablet, Take 100 mg by mouth 2 (two) times daily., Disp: , Rfl:    furosemide (LASIX) 20 MG tablet, TAKE 2 TABLET EVERY OTHER DAY AND 1 TABLET ON ODD DAYS. MAY TAKE AN ADDITIONAL TABLET FOR WEIGHT GAIN OF 3LBS OVERNIGHT OR INCREASED SWELLING, Disp: 200 tablet, Rfl: 3   glimepiride (AMARYL) 2 MG tablet, Take 2 mg by mouth daily with breakfast., Disp: , Rfl:    losartan (COZAAR) 50 MG tablet, TAKE 1 TABLET(50 MG) BY MOUTH  DAILY, Disp: 30 tablet, Rfl: 0   metoprolol tartrate (LOPRESSOR) 25 MG tablet, Take 0.5 tablets (12.5 mg total) by mouth 2 (two) times daily. (Patient taking differently: Take 25 mg by mouth 2 (two) times daily.), Disp: 30 tablet, Rfl: 3   prasugrel (EFFIENT) 10 MG TABS tablet, TAKE 1 TABLET(10 MG) BY MOUTH DAILY, Disp: 90 tablet, Rfl: 2   predniSONE (DELTASONE) 20 MG tablet, Take 20 mg by mouth daily with breakfast., Disp: , Rfl:    rosuvastatin (CRESTOR) 20 MG tablet, Take 1 tablet (20 mg total) by mouth daily., Disp: 90 tablet, Rfl: 3   nitroGLYCERIN (NITROSTAT) 0.6 MG SL tablet, Place 0.6 mg under the tongue as needed. (Patient not taking: Reported on 03/05/2023), Disp: , Rfl:   Allergies:  Allergies  Allergen Reactions   Plavix [Clopidogrel Bisulfate] Swelling   Brilinta [Ticagrelor] Cough   Enalapril Other (See Comments)    Past Medical History, Surgical history, Social history, and Family History were reviewed and updated.  Review of Systems: Review of Systems  Constitutional:  Negative for unexpected weight change.  HENT:  Negative.    Eyes: Negative.   Respiratory: Negative.    Cardiovascular: Negative.   Gastrointestinal: Negative.   Endocrine: Negative.   Genitourinary: Negative.  Musculoskeletal: Negative.   Skin: Negative.   Neurological: Negative.   Hematological: Negative.   Psychiatric/Behavioral: Negative.      Physical Exam:  height is 5\' 10"  (1.778 m) and weight is 214 lb 6.4 oz (97.3 kg). His oral temperature is 98.7 F (37.1 C). His blood pressure is 118/67 and his pulse is 66. His respiration is 18 and oxygen saturation is 98%.   Wt Readings from Last 3 Encounters:  03/05/23 214 lb 6.4 oz (97.3 kg)  02/22/23 215 lb (97.5 kg)  12/03/22 215 lb (97.5 kg)    Physical Exam Vitals reviewed.  Constitutional:      Comments: Gently using a cane.  HENT:     Head: Normocephalic and atraumatic.  Eyes:     Pupils: Pupils are equal, round, and reactive to  light.  Cardiovascular:     Rate and Rhythm: Normal rate and regular rhythm.     Heart sounds: Normal heart sounds.  Pulmonary:     Effort: Pulmonary effort is normal.     Breath sounds: Normal breath sounds.  Abdominal:     General: Bowel sounds are normal.     Palpations: Abdomen is soft.  Musculoskeletal:        General: No tenderness or deformity. Normal range of motion.     Cervical back: Normal range of motion.  Lymphadenopathy:     Cervical: No cervical adenopathy.  Skin:    General: Skin is warm and dry.     Findings: No erythema or rash.  Neurological:     Mental Status: He is alert and oriented to person, place, and time.  Psychiatric:        Behavior: Behavior normal.        Thought Content: Thought content normal.        Judgment: Judgment normal.      Lab Results  Component Value Date   WBC 6.3 03/05/2023   HGB 12.4 (L) 03/05/2023   HCT 38.7 (L) 03/05/2023   MCV 94.6 03/05/2023   PLT 204 03/05/2023     Chemistry      Component Value Date/Time   NA 140 03/05/2023 1232   NA 142 11/21/2021 0901   K 4.5 03/05/2023 1232   CL 103 03/05/2023 1232   CO2 28 03/05/2023 1232   BUN 22 03/05/2023 1232   BUN 12 11/21/2021 0901   CREATININE 1.05 03/05/2023 1232      Component Value Date/Time   CALCIUM 9.0 03/05/2023 1232   ALKPHOS 67 03/05/2023 1232   AST 21 03/05/2023 1232   ALT 17 03/05/2023 1232   BILITOT 0.4 03/05/2023 1232     Impression and Plan: Mr. Borroel is a very nice 87 year old white male.  He has recurrent iron deficiency anemia.  He last got IV iron back in July.    Today his Hgb is 12.4 which is down slightly but within his chronic range which waxes and wanes. MCV is 94.6, platelets are 204. Current iron studies are pending. He does not need additional iron today.    RTC 6 months APP, labs (CBC w/, CMP, iron, ferritin, retic)-[place]   Rushie Chestnut, PA-C 6/18/20241:48 PM

## 2023-03-05 NOTE — Telephone Encounter (Signed)
Last pick up for tramadol 02/04/2023 no refills & buprenorphine was last picked up 01/16/2023 and still does have refills.

## 2023-03-06 ENCOUNTER — Ambulatory Visit (INDEPENDENT_AMBULATORY_CARE_PROVIDER_SITE_OTHER): Payer: Medicare Other | Admitting: Podiatry

## 2023-03-06 ENCOUNTER — Telehealth: Payer: Self-pay

## 2023-03-06 DIAGNOSIS — M79674 Pain in right toe(s): Secondary | ICD-10-CM

## 2023-03-06 DIAGNOSIS — M79675 Pain in left toe(s): Secondary | ICD-10-CM | POA: Diagnosis not present

## 2023-03-06 DIAGNOSIS — B351 Tinea unguium: Secondary | ICD-10-CM

## 2023-03-06 MED ORDER — DICLOFENAC SODIUM 1 % EX GEL: 4.0000 g | Freq: Four times a day (QID) | CUTANEOUS | 1 refills | Status: DC

## 2023-03-06 NOTE — Progress Notes (Signed)
  Subjective:  Patient ID: Noah Scott, male    DOB: 1935-09-24,  MRN: [id number]  Chief Complaint  Patient presents with   Nail Problem     Routine foot care   87 y.o. male returns for the above complaint.  Patient presents with thickened elongated dystrophic mycotic toenails x 10 mild pain on palpation worse with ambulation or shoe pressure would like for me to be done is not able to do it himself denies any other acute complaints  Objective:  There were no vitals filed for this visit. Podiatric Exam: Vascular: dorsalis pedis and posterior tibial pulses are palpable bilateral. Capillary return is immediate. Temperature gradient is WNL. Skin turgor WNL  Sensorium: Normal Semmes Weinstein monofilament test. Normal tactile sensation bilaterally. Nail Exam: Pt has thick disfigured discolored nails with subungual debris noted bilateral entire nail hallux through fifth toenails.  Pain on palpation to the nails. Ulcer Exam: There is no evidence of ulcer or pre-ulcerative changes or infection. Orthopedic Exam: Muscle tone and strength are WNL. No limitations in general ROM. No crepitus or effusions noted.  Skin: No Porokeratosis. No infection or ulcers    Assessment & Plan:   1. Pain due to onychomycosis of toenails of both feet     Patient was evaluated and treated and all questions answered.  Onychomycosis with pain  -Nails palliatively debrided as below. -Educated on self-care  Procedure: Nail Debridement Rationale: pain  Type of Debridement: manual, sharp debridement. Instrumentation: Nail nipper, rotary burr. Number of Nails: 10  Procedures and Treatment: Consent by patient was obtained for treatment procedures. The patient understood the discussion of treatment and procedures well. All questions were answered thoroughly reviewed. Debridement of mycotic and hypertrophic toenails, 1 through 5 bilateral and clearing of subungual debris. No ulceration, no infection noted.  Return  Visit-Office Procedure: Patient instructed to return to the office for a follow up visit 3 months for continued evaluation and treatment.  Nicholes Rough, DPM    Return in about 3 months (around 06/06/2023).

## 2023-03-06 NOTE — Telephone Encounter (Signed)
-----   Message from Josph Macho, MD sent at 03/05/2023  8:29 PM EDT ----- Call - the iron level is ok!!!!  Cindee Lame

## 2023-03-06 NOTE — Telephone Encounter (Signed)
Called [facility] to clarify , no refills on tramadol

## 2023-03-06 NOTE — Telephone Encounter (Signed)
Called and informed patient of lab results, patient verbalized understanding and denies any questions or concerns at this time.   

## 2023-03-07 MED ORDER — TRAMADOL HCL 50 MG PO TABS: ORAL_TABLET | ORAL | 0 refills | Status: DC

## 2023-03-07 NOTE — Addendum Note (Signed)
Addended by: Gwenevere Abbot on: 03/07/2023 02:55 PM   Modules accepted: Orders

## 2023-03-08 ENCOUNTER — Telehealth: Payer: Self-pay | Admitting: Medical Oncology

## 2023-03-08 ENCOUNTER — Encounter: Payer: Self-pay | Admitting: Physical Medicine and Rehabilitation

## 2023-03-08 NOTE — Telephone Encounter (Signed)
Patient has been scheduled per 03/05/23 LOS. Aware of appt date and time

## 2023-04-15 ENCOUNTER — Telehealth: Payer: Self-pay | Admitting: Medical

## 2023-04-15 NOTE — Telephone Encounter (Signed)
Pt called requesting a refill for prasugrel (EFFIENT) 10 MG TABS tablet [[id number]],  pt was advised it was last filled 05/2021. He states he does not need an appt, please advise. He is currently still using     [id number] - [place], [place] - [address] AT [facility] [address], [place] [address] Phone: [phone]  Fax: [phone].

## 2023-04-16 ENCOUNTER — Other Ambulatory Visit: Payer: Self-pay

## 2023-04-17 MED ORDER — PRASUGREL HCL 10 MG PO TABS: 10.0000 mg | ORAL_TABLET | Freq: Every day | ORAL | 0 refills | Status: DC

## 2023-04-17 NOTE — Telephone Encounter (Signed)
Okay to refill, but he is overdue for a visit with Dr. Herbie Baltimore.  Please schedule a follow-up appointment.

## 2023-04-22 ENCOUNTER — Encounter: Payer: Medicare Other | Admitting: Physical Medicine and Rehabilitation

## 2023-04-23 ENCOUNTER — Encounter: Payer: Medicare Other | Admitting: Physical Medicine and Rehabilitation

## 2023-04-29 ENCOUNTER — Telehealth: Payer: Self-pay | Admitting: Medical

## 2023-04-29 MED ORDER — FUROSEMIDE 20 MG PO TABS: ORAL_TABLET | ORAL | 3 refills | Status: DC

## 2023-04-29 MED ORDER — METOPROLOL TARTRATE 25 MG PO TABS: 12.5000 mg | ORAL_TABLET | Freq: Two times a day (BID) | ORAL | 3 refills | Status: DC

## 2023-04-29 NOTE — Addendum Note (Signed)
Addended by: Gwenevere Abbot on: 04/29/2023 09:04 PM   Modules accepted: Orders

## 2023-04-29 NOTE — Telephone Encounter (Signed)
Prescription Request  04/29/2023  Is this a "Controlled Substance" medicine? No  LOV: 02/22/2023  What is the name of the medication or equipment? furosemide (LASIX) 20 MG tablet & metoprolol tartrate (LOPRESSOR) 25 MG tablet   Have you contacted your pharmacy to request a refill? No   Which pharmacy would you like this sent to?  [id number] - [place], [place] - [address] [place] [address] Phone: [phone] Fax: [phone]    Patient notified that their request is being sent to the clinical staff for review and that they should receive a response within 2 business days.   Please advise at [facility] [phone]

## 2023-04-30 NOTE — Telephone Encounter (Signed)
Pt coming into office 05/14/23

## 2023-05-06 ENCOUNTER — Telehealth: Payer: Self-pay | Admitting: Medical

## 2023-05-06 ENCOUNTER — Telehealth: Payer: Self-pay | Admitting: Cardiology

## 2023-05-06 NOTE — Telephone Encounter (Signed)
Called pt and left message informing pt that he needed to contact his PCP for a refill on tramadol, because this medication is not a cardiac medication and that his PCP follows him for this medication, to give his PCP a call for a refill. I advised the pt that if he has any other problems, questions or concerns, to give our office a call back.

## 2023-05-06 NOTE — Telephone Encounter (Signed)
*  STAT* If patient is at the pharmacy, call can be transferred to refill team.   1. Which medications need to be refilled? (please list name of each medication and dose if known)   traMADol (ULTRAM) 50 MG tablet    2. Would you like to learn more about the convenience, safety, & potential cost savings by using the [facility]?   3. Are you open to using the [facility] (Type [facility]. ).  4. Which pharmacy/location (including street and city if local pharmacy) is medication to be sent to?  [id number] - [place], [place] - [address]   5. Do they need a 30 day or 90 day supply?   90 day  Patient stated he still has some medication.

## 2023-05-06 NOTE — Telephone Encounter (Signed)
Prescription Request  05/06/2023  Is this a "Controlled Substance" medicine? Yes  LOV: 02/22/2023  What is the name of the medication or equipment?   traMADol (ULTRAM) 50 MG tablet [[id number]]  Have you contacted your pharmacy to request a refill? No   Which pharmacy would you like this sent to?  [id number] - [place], [place] - [address] [place] [address] Phone: [phone] Fax: [phone]    Patient notified that their request is being sent to the clinical staff for review and that they should receive a response within 2 business days.   Please advise at Mobile [phone] (mobile)

## 2023-05-07 ENCOUNTER — Other Ambulatory Visit: Payer: Self-pay | Admitting: Family

## 2023-05-07 MED ORDER — TRAMADOL HCL 50 MG PO TABS: ORAL_TABLET | ORAL | 0 refills | Status: DC

## 2023-05-07 NOTE — Telephone Encounter (Signed)
Pt notified that rx has been sent in. 

## 2023-05-07 NOTE — Telephone Encounter (Signed)
Requesting: tramadol 50mg  Contract: Yes UDS: 03/07/23 Last Visit: 02/22/2023 Next Visit: 05/14/2023 Last Refill: 03/07/23  Please Advise

## 2023-05-13 ENCOUNTER — Encounter
Payer: Medicare Other | Attending: Physical Medicine and Rehabilitation | Admitting: Physical Medicine and Rehabilitation

## 2023-05-13 ENCOUNTER — Encounter: Payer: Self-pay | Admitting: Physical Medicine and Rehabilitation

## 2023-05-13 VITALS — BP 135/83 | HR 68 | Ht 70.0 in | Wt 206.6 lb

## 2023-05-13 DIAGNOSIS — M25561 Pain in right knee: Secondary | ICD-10-CM | POA: Insufficient documentation

## 2023-05-13 DIAGNOSIS — M25562 Pain in left knee: Secondary | ICD-10-CM | POA: Diagnosis present

## 2023-05-13 DIAGNOSIS — Z5181 Encounter for therapeutic drug level monitoring: Secondary | ICD-10-CM | POA: Diagnosis present

## 2023-05-13 DIAGNOSIS — G8929 Other chronic pain: Secondary | ICD-10-CM | POA: Diagnosis present

## 2023-05-13 DIAGNOSIS — Z029 Encounter for administrative examinations, unspecified: Secondary | ICD-10-CM

## 2023-05-13 DIAGNOSIS — Z9889 Other specified postprocedural states: Secondary | ICD-10-CM | POA: Insufficient documentation

## 2023-05-13 DIAGNOSIS — G894 Chronic pain syndrome: Secondary | ICD-10-CM | POA: Diagnosis present

## 2023-05-13 DIAGNOSIS — M48062 Spinal stenosis, lumbar region with neurogenic claudication: Secondary | ICD-10-CM | POA: Diagnosis present

## 2023-05-13 DIAGNOSIS — M79605 Pain in left leg: Secondary | ICD-10-CM | POA: Insufficient documentation

## 2023-05-13 DIAGNOSIS — M79604 Pain in right leg: Secondary | ICD-10-CM | POA: Diagnosis present

## 2023-05-13 MED ORDER — METHOCARBAMOL 500 MG PO TABS: 500.0000 mg | ORAL_TABLET | Freq: Every day | ORAL | 2 refills | Status: DC

## 2023-05-13 NOTE — Patient Instructions (Addendum)
When your next medication refill is due in September, I will refill your tramadol 50 mg 3 times daily for 3 months.  After that, follow-up with our clinic for further refills.  You will have these appointments with either myself or our nurse practitioner Riley Lam.  Start Robaxin 500 mg at nighttime to help with muscle pain and spasms  Start physical therapy to work on stretching, strengthening, range of motion, and improved gait tolerance.  I suspect he would benefit from epidural steroid injections in the future, but before we can do this you will need to have a full trial of physical therapy, so I recommend attending the full 4-6 appointments so that insurance would approve potential injections in the future.  If there is no improvement in your leg pain with these modalities, we may consider repeating your EMG or getting new imaging of your low back to assess for which levels would benefit most from an injection.  This could be done with Dr. Wynn Banker at our office.  I am referring you to orthopedic surgery and getting x-rays of your knees to look for advanced arthritis, you can discuss with them possible injections versus need further intervention.  Continue being active, doing home exercises

## 2023-05-13 NOTE — Progress Notes (Signed)
Subjective:    Patient ID: Noah Scott, male    DOB: 1936/08/10, 87 y.o.   MRN: [id number]  HPI HPI  Noah Scott is a 87 y.o. 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   They are presenting to PM&R clinic as a new patient for pain management evaluation. They were referred by Esperanza Richters, PA-C  for treatment of chronic low back pain.   Per referral: Chronic back pain syndrome post surgery. Has been on tramadol. Pt had current pain management. Seeking new pain management. Pt has already seen [facility]   Per [facility] last nite 04/10/23: 04/10/2023 Noah Scott returns for pharmacological management and follow-up of his chronic lower back and leg pain. At his last visit we considered Butrans over tramadol. He used the patch for 4 weeks and did not note any significant change in his pain. Over the last 3 visits he has not had noticeable impact on his pain and continues to rate his pain at a 10/10 and complete interference with his quality of life and function despite use of pain medications. He denies any new symptoms or new medical problems. ... not interested in interventional options   Source: Pain in his back that travels down his legs, equally bad on both legs, mostly down the medical shins  Inciting incident: Had L4/5 fusion with Dr. Marikay Alar 3 years prior for back pain; post-op he started getting pains in his legs, starting right after surgery but getting worse over time.   Description of pain: Electric, shooting Exacerbating factors: bending forward, bending backwards,  laying flat, laying on side, standing, and activity Remitting factors: Sitting Red flag symptoms:   , No red flags for back pain endorsed in Hx or ROS, and pain waking up at nighttime.  + pain interferes with sleep significantly  Medications tried: Topical medications (no effect) :   Nsaids (mild effect) : Taken off due to blood thinner; physician disagreed with NP and pharmacist on this point; "we don't get along."  Tylenol  (no effect) :   Opiates  (moderate effect) :  Was on Butrans 10 mcg/hr - no effect Tramadol 50 mg TID  - needs more when he is walking around, "I have to take a second just to sit down".   Gabapentin / Lyrica  (unsure of effect) : Gabapentin "sent my mind crazy"; it "made my mind go back in time".   Was on Lyrica, unsure if it helped or had side effects.   TCAs  (never tried) :   SNRIs  (never tried) :   Other  (no effect) : Has been on in the past, unsure what, did not help  Other treatments: PT/OT  (moderate effect) : Last 8 months ago, "I stopped going because he had me standing on the wall, I can do that stuff at home." He uses bands at home for resistance, mostly with his arms but sometimes with his leg.   Gets a "stretching" sensation behind his knees with exercise, which is painful.   Accupuncture/chiropractor/massage  (never tried) :   TENs unit (never tried) :   Injections (never tried) : "He wanted to give me a spinal tap; I wouldn't take  it." He did not think he needed it at the time.   Surgery (no effect) : Fusion as above  Recently had nerve "ablation" in both medial shins   Other  () :  Goals for pain control: Patient states he is not limited in functional activities despite pain; would like to have it down to 8/10  EMG: Had done next door a few years ago; unsure of results, says "everything was fine".   MRI lumbar spine 2019:  IMPRESSION:  1. Multilevel spondylosis of the lumbar spine as described.  2. Mild right foraminal  narrowing at T12-L1.  3. Mild subarticular and foraminal narrowing bilaterally at L1-2.  4. Moderate left and mild right subarticular narrowing at L2-3 with  moderate foraminal stenosis bilaterally, worse on the left.  5. Mild subarticular and moderate bilateral foraminal stenosis at  L3-4.  6. Moderate right and mild left subarticular narrowing with moderate  foraminal narrowing bilaterally at L4-5.  7. Moderate right and mild left subarticular narrowing with mild  foraminal narrowing bilaterally at L5-S1.   Prior UDS results: No results found for: "LABOPIA", "COCAINSCRNUR", "LABBENZ", "AMPHETMU", "THCU", "LABBARB"    Pain Inventory Average Pain 8 Pain Right Now 8 My pain is aching  In the last 24 hours, has pain interfered with the following? General activity 7 Relation with others 6 Enjoyment of life 7 What TIME of day is your pain at its worst? varies Sleep (in general) NA  Pain is worse with: sitting, standing, and sleeping Pain improves with: medication Relief from Meds: 8  use a cane how many minutes can you walk? 18 ability to climb steps?  yes  retired  trouble walking  Any changes since last visit?  no  Any changes since last visit?  no    Family History  Problem Relation Age of Onset   Heart disease Father        Pt does not know details   Stroke Neg Hx    Diabetes Neg Hx    Social History   Socioeconomic History   Marital status: Widowed    Spouse name: Not on file   Number of children: 1   Years of education: 29   Highest education level: Not on file  Occupational History   Occupation: retired Geologist, engineering  Tobacco Use   Smoking status: Former    Current packs/day: 0.00    Types: Cigarettes    Quit date: 03/02/1969    Years since quitting: 54.2   Smokeless tobacco: Never  Vaping Use   Vaping status: Never Used  Substance and Sexual Activity   Alcohol use: Not Currently   Drug use: No   Sexual activity: Not on file  Other  Topics Concern   Not on file  Social History Narrative   Lives alone in an apartment on the first floor.  Has one daughter.  Retired Geologist, engineering.  Education: some college.    Social Determinants of Health   Financial Resource Strain: Low Risk  (10/17/2022)   Overall Financial Resource Strain (CARDIA)    Difficulty of Paying Living Expenses: Not hard at all  Food Insecurity: No Food Insecurity (10/17/2022)   Hunger Vital Sign    Worried About Running Out of Food in the Last Year: Never true    Ran Out of Food in the Last Year: Never true  Transportation Needs: No Transportation Needs (10/17/2022)   PRAPARE - Transportation    Lack of Transportation (Medical): No    Lack of  Transportation (Non-Medical): No  Physical Activity: Insufficiently Active (10/17/2022)   Exercise Vital Sign    Days of Exercise per Week: 7 days    Minutes of Exercise per Session: 10 min  Stress: No Stress Concern Present (10/17/2022)   [employer] of Occupational Health - Occupational Stress Questionnaire    Feeling of Stress : Not at all  Social Connections: Moderately Isolated (10/17/2022)   Social Connection and Isolation Panel [NHANES]    Frequency of Communication with Friends and Family: More than three times a week    Frequency of Social Gatherings with Friends and Family: More than three times a week    Attends Religious Services: Never    Database administrator or Organizations: Yes    Attends Banker Meetings: 1 to 4 times per year    Marital Status: Widowed   Past Surgical History:  Procedure Laterality Date   CORONARY STENT INTERVENTION N/A 11/25/2017   Procedure: CORONARY STENT INTERVENTION;  Surgeon: Marykay Lex, MD;  Location: [facility] INVASIVE CV LAB;  Service: Cardiovascular:   m-dRCA 95%  (DES PCI - 0%.  Resolute ONYX DES 2.75 x 18 mm - ~3.0 mm). Ost-proxRCA 75-65% (DES PCI-0%. 2 overlapping Resolute ONYX DES 3.0 x 26 & 3.0 x 8 mm --> 3.5-3.3 mm)   CORONARY STENT  INTERVENTION  2001   ([place]): PCI OM1   LEA DOPPLERS  12/12/2017   Normal bilateral ABIs and TBI's.   LEFT HEART CATH AND CORONARY ANGIOGRAPHY N/A 11/25/2017   Procedure: LEFT HEART CATH AND CORONARY ANGIOGRAPHY;  Surgeon: Marykay Lex, MD;  Location: [facility] INVASIVE CV LAB;  Service: Cardiovascular:  Ost-proxRCA 75&65%(DES PCI), m-dRCA 95% (DES PCI), dRCA 50%-25% ost rPDA. ostOM1 65% ISR (from 2001).    LUMBAR LAMINECTOMY/DECOMPRESSION MICRODISCECTOMY Bilateral 06/13/2018   Procedure: Laminectomy and Foraminotomy - L1-L2 - L2-L3 - L3-L4 - bilateral;  Surgeon: Tia Alert, MD;  Location: [facility] OR;  Service: Neurosurgery;  Laterality: Bilateral;   TRANSTHORACIC ECHOCARDIOGRAM  11/23/2017   a) 11/2017: Nl LV size & Fxn. EF 60 - 65%.  No RWMA.  GR 1 DD.  Mild RV dilation.; b) 05/2020:([facility]): Normal LV size and function.  EF 60 and 65%.  Normal RV size and function.  Aortic sclerosis with no stenosis.  No AI.  Mildly dilated ascending aorta.  CVP ~5 mm.   TRANSTHORACIC ECHOCARDIOGRAM  03/31/2021   EF 55 to 60%.  GR 1 DD.  Ascending aorta measured at 41 mm-moderate dilation.  Recommended follow-up with imaging study in 6 months to reassess.   Past Medical History:  Diagnosis Date   CAD S/P percutaneous coronary angioplasty 2001   a) 2001 ([place]): PCI OM1 - now with ~65% ISR.;; b) NSTEMI 11/2017 - ost-prox RCA 75-65% (Overlapping ONYX DES 3.0 x 26 & 3.0 x 8 --> 3.5-3.3 mm). m-dRCA 95% (ONYX DES 2.75 x 18 mm -> 3.0 mm). Ost OM1 65% ISR (med Rx). dRCA-OstRDA (50%-25%)   Essential (primary) hypertension 11/21/2017   Hyperlipidemia 11/21/2017   Iron deficiency anemia due to chronic blood loss 08/08/2021   Iron malabsorption 08/08/2021   Neurogenic claudication due to lumbar spinal stenosis 02/06/2018   NSTEMI (non-ST elevated myocardial infarction) (HCC) 11/21/2017   Multiple RCA lesions - 2 Overlapping DES Ost-Prox RCA & 1 in mid-distal RCA   Spinal stenosis of lumbar region    Venous  reflux 11/2020   Venous Dopplers 12/15/2020: No DVT or superficial venous thrombosis bilaterally.  Venous reflux noted in  bilateral FV and saphenofemoral junctions, R GSV in the thigh and R SFV, left proximal thigh GSV. -->  No invasive options due to deep and superficial venous reflux.   BP 135/83   Pulse 68   Ht 5\' 10"  (1.778 m)   Wt 206 lb 9.6 oz (93.7 kg)   SpO2 96%   BMI 29.64 kg/m   Opioid Risk Score:   Fall Risk Score:  `1  Depression screen [facility] 2/9     10/17/2022   10:10 AM 08/13/2022   10:33 AM 05/02/2018   12:07 PM 02/12/2018    4:54 PM  Depression screen PHQ 2/9  Decreased Interest 0 0 0 0  Down, Depressed, Hopeless 0 0 0 0  PHQ - 2 Score 0 0 0 0     Review of Systems  Constitutional: Negative.   HENT: Negative.    Eyes: Negative.   Respiratory: Negative.    Cardiovascular: Negative.   Gastrointestinal: Negative.   Endocrine: Negative.   Genitourinary: Negative.   Musculoskeletal:  Positive for back pain and gait problem.  Skin: Negative.   Allergic/Immunologic: Negative.   Hematological: Negative.   Psychiatric/Behavioral: Negative.    All other systems reviewed and are negative.      Objective:   Physical Exam   PE: Constitution: Appropriate appearance for age. No apparent distress  Resp: No respiratory distress. No accessory muscle usage. on RA and CTAB Cardio: Well perfused appearance. No peripheral edema. Abdomen: Nondistended. Nontender.   Psych: Appropriate mood and affect. Neuro: AAOx4. No apparent cognitive deficits   Neurologic Exam:   DTRs: Reflexes were 2+ in bilateral achilles, patella, biceps, BR and triceps. Babinsky: flexor responses b/l.   Hoffmans: negative b/l Sensory exam: revealed normal sensation in all dermatomal regions in bilateral lower extremities and with reduced sensation to light touch in right medial knee Motor exam: strength 5/5 throughout bilateral legs Coordination: Fine motor coordination was normal.   Gait:  +antalgic gait favoring right leg; stiff gait. + cane in right hand  MSK: Back + bilateral SLR with stretching pain Negative facet loading Negative TTP Negative spurling's, Lhermitte's  B/l knees: + TTP bilateral medial knee joint lines + L knee effusion, palpable medially and laterally. None palpable on R.  Negative McMurrey's, anterior drawer, and posterior drawers    Assessment & Plan:   Noah Scott is a 87 y.o. 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   They are presenting to PM&R clinic as a new patient for treatment of low back pain and bilateral leg pain . T   Leg pain, bilateral S/P lumbar laminectomy Neurogenic claudication due to lumbar spinal stenosis -     Ambulatory referral to Physical Therapy  EMG: Had done next door a few years ago; unsure of results, says "everything was fine".   MRI lumbar spine 2019:  IMPRESSION:  1. Multilevel spondylosis of the lumbar spine as described.  2. Mild right foraminal narrowing at T12-L1.  3. Mild subarticular and foraminal narrowing bilaterally at L1-2.  4. Moderate left and mild right subarticular narrowing at L2-3 with  moderate foraminal stenosis bilaterally, worse on the left.  5. Mild subarticular and moderate bilateral foraminal stenosis at  L3-4.  6. Moderate right and mild left subarticular narrowing with moderate  foraminal narrowing bilaterally at L4-5.  7. Moderate right and mild left subarticular narrowing with mild  foraminal narrowing bilaterally at L5-S1.   Start physical therapy to work on  stretching, strengthening, range of motion, and improved gait tolerance.  I suspect he would benefit from epidural steroid injections in the future, but before we can do this you will need to have a full trial of physical therapy, so I recommend attending the full 4-6 appointments so that insurance would approve potential injections in the future.  If there is no improvement in your leg pain with these modalities, we may consider repeating your EMG or getting new imaging of your low back to assess for which levels would benefit most from an injection.  This could be done with Dr. Wynn Banker at our office.  Chronic pain of both knees -     Ambulatory referral to Orthopedic Surgery -     DG Knee 1-2 Views Left; Future -     DG Knee 1-2 Views Right; Future  I am referring you to orthopedic surgery and getting x-rays of your knees to look for advanced arthritis, you can discuss with them possible injections versus need further intervention. Ortho can perform injections if needed.   Continue being active, doing home exercises  Chronic pain syndrome Encounter for opiate analgesic use agreement Encounter for medication monitoring  When your next medication refill is due in September, I will refill your tramadol 50 mg 3 times daily for 3 months.  After that, follow-up with our clinic for further refills.  You will have these appointments with either myself or our nurse practitioner Riley Lam.  Start Robaxin 500 mg at nighttime to help with muscle pain and spasms  Other orders -     Methocarbamol; Take 1 tablet (500 mg total) by mouth at bedtime.  Dispense: 90 tablet; Refill: 2

## 2023-05-14 ENCOUNTER — Encounter: Payer: Medicare Other | Admitting: Medical

## 2023-05-15 NOTE — Progress Notes (Signed)
This encounter was created in error - please disregard.

## 2023-05-16 ENCOUNTER — Other Ambulatory Visit: Payer: Self-pay | Admitting: Cardiology

## 2023-05-18 MED ORDER — TRAMADOL HCL 50 MG PO TABS: 50.0000 mg | ORAL_TABLET | Freq: Three times a day (TID) | ORAL | 0 refills | Status: DC | PRN

## 2023-05-18 MED ORDER — TRAMADOL HCL 50 MG PO TABS: 50.0000 mg | ORAL_TABLET | Freq: Three times a day (TID) | ORAL | 0 refills | Status: AC | PRN

## 2023-05-23 ENCOUNTER — Telehealth: Payer: Self-pay | Admitting: Cardiology

## 2023-05-23 ENCOUNTER — Ambulatory Visit: Payer: Medicare Other | Admitting: Student

## 2023-05-23 DIAGNOSIS — I2510 Atherosclerotic heart disease of native coronary artery without angina pectoris: Secondary | ICD-10-CM

## 2023-05-23 DIAGNOSIS — I25118 Atherosclerotic heart disease of native coronary artery with other forms of angina pectoris: Secondary | ICD-10-CM

## 2023-05-23 DIAGNOSIS — I10 Essential (primary) hypertension: Secondary | ICD-10-CM

## 2023-05-23 NOTE — Telephone Encounter (Signed)
Patient came in for cancelled appt. I let him know of his October appt. Patient stated he received no call letting him know of cancelled appt. Patient stated if he did not receive a reminder he would not come in for October ppt.

## 2023-05-23 NOTE — Telephone Encounter (Signed)
When pt came in for cancelled ppt, I offered to have him r/s for another sooner appt, patient declined. Patient stated he didn't see why he had to r/s. He wanted [place] to reach out.

## 2023-05-23 NOTE — Telephone Encounter (Signed)
Returned pt call to let him know this message will be forwarded to Dr. Herbie Baltimore and his nurse. Pt is very upset that he came all the way here to be told his appt was rescheduled and no one told him. He states "I was burnt up about this".

## 2023-05-23 NOTE — Telephone Encounter (Signed)
Patient called in upset to say that we r/s him without notifying him of the change. And was upset that we couldn't send in a 90 day supply for his meds instead of the 30 day supply. Apologize to the patient but explain that wouldn't be able to send in a 90 day supple until he has appt. Please advise

## 2023-05-27 ENCOUNTER — Other Ambulatory Visit: Payer: Self-pay | Admitting: Cardiology

## 2023-05-27 DIAGNOSIS — I10 Essential (primary) hypertension: Secondary | ICD-10-CM

## 2023-05-27 DIAGNOSIS — I25118 Atherosclerotic heart disease of native coronary artery with other forms of angina pectoris: Secondary | ICD-10-CM

## 2023-05-27 MED ORDER — LOSARTAN POTASSIUM 50 MG PO TABS: 50.0000 mg | ORAL_TABLET | Freq: Every day | ORAL | 0 refills | Status: DC

## 2023-05-27 MED ORDER — PRASUGREL HCL 5 MG PO TABS: 5.0000 mg | ORAL_TABLET | Freq: Every day | ORAL | 0 refills | Status: DC

## 2023-05-27 NOTE — Addendum Note (Signed)
Addended by: Jeyla Bulger, Griffin Gerrard C on: 05/27/2023 05:46 PM   Modules accepted: Orders

## 2023-05-27 NOTE — Telephone Encounter (Signed)
Spoke with pt regarding his concerns. Med list updated. Confirmed he needs refills of prasugrel 10mg  tabs (90 day supply due to cost) and losartan 50mg  tabs (30 day supply) to last until his appointment with Juliane Lack, NP, on 06/19/23.  Discussed with Dr. Herbie Baltimore and received verbal orders; Dr. Herbie Baltimore did decrease prasugrel dose to 5mg  daily. Pt made aware of dose change and verbalizes understanding. Medication refills sent to preferred pharmacy. Pt voiced appreciation of call and assistance.

## 2023-05-28 ENCOUNTER — Ambulatory Visit (INDEPENDENT_AMBULATORY_CARE_PROVIDER_SITE_OTHER): Payer: Medicare Other | Admitting: Medical

## 2023-05-28 VITALS — BP 110/67 | HR 62 | Temp 98.7°F | Resp 16 | Ht 70.0 in | Wt 209.0 lb

## 2023-05-28 DIAGNOSIS — L603 Nail dystrophy: Secondary | ICD-10-CM | POA: Diagnosis not present

## 2023-05-28 DIAGNOSIS — I10 Essential (primary) hypertension: Secondary | ICD-10-CM

## 2023-05-28 DIAGNOSIS — M549 Dorsalgia, unspecified: Secondary | ICD-10-CM

## 2023-05-28 DIAGNOSIS — Z23 Encounter for immunization: Secondary | ICD-10-CM | POA: Diagnosis not present

## 2023-05-28 DIAGNOSIS — E785 Hyperlipidemia, unspecified: Secondary | ICD-10-CM | POA: Diagnosis not present

## 2023-05-28 DIAGNOSIS — N400 Enlarged prostate without lower urinary tract symptoms: Secondary | ICD-10-CM

## 2023-05-28 DIAGNOSIS — G8929 Other chronic pain: Secondary | ICD-10-CM

## 2023-05-28 NOTE — Addendum Note (Signed)
Addended by: Maximino Sarin on: 05/28/2023 02:03 PM   Modules accepted: Orders

## 2023-05-28 NOTE — Patient Instructions (Addendum)
Chronic Back Pain Longstanding issue, exacerbated by lying down. Pain partially controlled with Tramadol, but patient reports needing to take it every 6 hours (4 times a day) for adequate relief. No reported side effects from Tramadol. -Reach out to physical medicine doctor to discuss patient's Tramadol use explain you desire every 6 hour use as you had used every 6 hours in past with no side effect and better pain control  Htn- bp well controlled. Continue losartan, metoprolol and lasix.   Bph- continue  cardura 8 mg daily. Symptoms controlled.   Hyperlipidemia- continue crestor 20 mg daily.  Defer lab to cardiologist as you will be seeing cardiologist next week.  Follow up in 6 months or sooner if needed.

## 2023-05-28 NOTE — Progress Notes (Signed)
Subjective:    Patient ID: Noah Scott, male    DOB: [birth year], 87 y.o.   MRN: [id number]  HPI  Discussed the use of AI scribe software for clinical note transcription with the patient, who gave verbal consent to proceed.  History of Present Illness   The patient has a longstanding history of chronic back pain, managed with Tramadol. Despite the medication, he reports persistent pain, particularly when lying down, which disrupts his sleep every two hours. The pain is not localized and is present whether he lies on his side or back. He has found some relief by placing a pillow between his knees when sleeping on his side.  The patient has been on Tramadol for years, initially prescribed four times a day every six hours. However, the dosage was reduced to three times a day. Despite the reduction, the patient has continued to take the medication every six hours, as he finds this regimen more effective in managing his pain. He reports no side effects such as drowsiness, falls, or dizziness from the medication.      The intensity of his pain remains relatively constant.             MRI lumbar spine 2019:  IMPRESSION:  1. Multilevel spondylosis of the lumbar spine as described.  2. Mild right foraminal narrowing at T12-L1.  3. Mild subarticular and foraminal narrowing bilaterally at L1-2.  4. Moderate left and mild right subarticular narrowing at L2-3 with  moderate foraminal stenosis bilaterally, worse on the left.  5. Mild subarticular and moderate bilateral foraminal stenosis at  L3-4.  6. Moderate right and mild left subarticular narrowing with moderate  foraminal narrowing bilaterally at L4-5.  7. Moderate right and mild left subarticular narrowing with mild  foraminal narrowing bilaterally at L5-S1.       Htn- bp well controlled. Continue losartan, metoprolol and lasix.   Bph- continue  cardura 8 mg daily. Symptoms controlled.   Hyperlipidemia- continue crestor 20 mg  daily.  Review of Systems  Constitutional:  Negative for chills, fatigue and fever.  HENT:  Negative for dental problem, hearing loss and rhinorrhea.   Respiratory:  Negative for cough, chest tightness, wheezing and stridor.   Cardiovascular:  Negative for chest pain and palpitations.  Gastrointestinal:  Negative for abdominal pain.  Musculoskeletal:  Positive for back pain. Negative for myalgias.  Skin:  Negative for rash.  Psychiatric/Behavioral:  Negative for behavioral problems and decreased concentration.     Past Medical History:  Diagnosis Date   CAD S/P percutaneous coronary angioplasty 2001   a) 2001 ([place]): PCI OM1 - now with ~65% ISR.;; b) NSTEMI 11/2017 - ost-prox RCA 75-65% (Overlapping ONYX DES 3.0 x 26 & 3.0 x 8 --> 3.5-3.3 mm). m-dRCA 95% (ONYX DES 2.75 x 18 mm -> 3.0 mm). Ost OM1 65% ISR (med Rx). dRCA-OstRDA (50%-25%)   Essential (primary) hypertension 11/21/2017   Hyperlipidemia 11/21/2017   Iron deficiency anemia due to chronic blood loss 08/08/2021   Iron malabsorption 08/08/2021   Neurogenic claudication due to lumbar spinal stenosis 02/06/2018   NSTEMI (non-ST elevated myocardial infarction) (HCC) 11/21/2017   Multiple RCA lesions - 2 Overlapping DES Ost-Prox RCA & 1 in mid-distal RCA   Spinal stenosis of lumbar region    Venous reflux 11/2020   Venous Dopplers 12/15/2020: No DVT or superficial venous thrombosis bilaterally.  Venous reflux noted in bilateral FV and saphenofemoral junctions, R GSV in the thigh and R SFV, left proximal thigh GSV. -->  No invasive options due to deep and superficial venous reflux.     Social History   Socioeconomic History   Marital status: Widowed    Spouse name: Not on file   Number of children: 1   Years of education: 34   Highest education level: Not on file  Occupational History   Occupation: retired Geologist, engineering  Tobacco Use   Smoking status: Former    Current packs/day: 0.00    Types: Cigarettes     Quit date: 03/02/1969    Years since quitting: 54.2   Smokeless tobacco: Never  Vaping Use   Vaping status: Never Used  Substance and Sexual Activity   Alcohol use: Not Currently   Drug use: No   Sexual activity: Not on file  Other Topics Concern   Not on file  Social History Narrative   Lives alone in an apartment on the first floor.  Has one daughter.  Retired Geologist, engineering.  Education: some college.    Social Determinants of Health   Financial Resource Strain: Low Risk  (10/17/2022)   Overall Financial Resource Strain (CARDIA)    Difficulty of Paying Living Expenses: Not hard at all  Food Insecurity: No Food Insecurity (10/17/2022)   Hunger Vital Sign    Worried About Running Out of Food in the Last Year: Never true    Ran Out of Food in the Last Year: Never true  Transportation Needs: No Transportation Needs (10/17/2022)   PRAPARE - Administrator, Civil Service (Medical): No    Lack of Transportation (Non-Medical): No  Physical Activity: Insufficiently Active (10/17/2022)   Exercise Vital Sign    Days of Exercise per Week: 7 days    Minutes of Exercise per Session: 10 min  Stress: No Stress Concern Present (10/17/2022)   [employer] of Occupational Health - Occupational Stress Questionnaire    Feeling of Stress : Not at all  Social Connections: Moderately Isolated (10/17/2022)   Social Connection and Isolation Panel [NHANES]    Frequency of Communication with Friends and Family: More than three times a week    Frequency of Social Gatherings with Friends and Family: More than three times a week    Attends Religious Services: Never    Database administrator or Organizations: Yes    Attends Banker Meetings: 1 to 4 times per year    Marital Status: Widowed  Intimate Partner Violence: Not At Risk (10/17/2022)   Humiliation, Afraid, Rape, and Kick questionnaire    Fear of Current or Ex-Partner: No    Emotionally Abused: No    Physically  Abused: No    Sexually Abused: No    Past Surgical History:  Procedure Laterality Date   CORONARY STENT INTERVENTION N/A 11/25/2017   Procedure: CORONARY STENT INTERVENTION;  Surgeon: Marykay Lex, MD;  Location: [facility] INVASIVE CV LAB;  Service: Cardiovascular:   m-dRCA 95%  (DES PCI - 0%.  Resolute ONYX DES 2.75 x 18 mm - ~3.0 mm). Ost-proxRCA 75-65% (DES PCI-0%. 2 overlapping Resolute ONYX DES 3.0 x 26 & 3.0 x 8 mm --> 3.5-3.3 mm)   CORONARY STENT INTERVENTION  2001   ([place]): PCI OM1   LEA DOPPLERS  12/12/2017   Normal bilateral ABIs and TBI's.   LEFT HEART CATH AND CORONARY ANGIOGRAPHY N/A 11/25/2017   Procedure: LEFT HEART CATH AND CORONARY ANGIOGRAPHY;  Surgeon: Marykay Lex, MD;  Location: [facility] INVASIVE CV LAB;  Service: Cardiovascular:  Ost-proxRCA 75&65%(DES PCI),  m-dRCA 95% (DES PCI), dRCA 50%-25% ost rPDA. ostOM1 65% ISR (from 2001).    LUMBAR LAMINECTOMY/DECOMPRESSION MICRODISCECTOMY Bilateral 06/13/2018   Procedure: Laminectomy and Foraminotomy - L1-L2 - L2-L3 - L3-L4 - bilateral;  Surgeon: Tia Alert, MD;  Location: [facility];  Service: Neurosurgery;  Laterality: Bilateral;   TRANSTHORACIC ECHOCARDIOGRAM  11/23/2017   a) 11/2017: Nl LV size & Fxn. EF 60 - 65%.  No RWMA.  GR 1 DD.  Mild RV dilation.; b) 05/2020:([facility]): Normal LV size and function.  EF 60 and 65%.  Normal RV size and function.  Aortic sclerosis with no stenosis.  No AI.  Mildly dilated ascending aorta.  CVP ~5 mm.   TRANSTHORACIC ECHOCARDIOGRAM  03/31/2021   EF 55 to 60%.  GR 1 DD.  Ascending aorta measured at 41 mm-moderate dilation.  Recommended follow-up with imaging study in 6 months to reassess.    Family History  Problem Relation Age of Onset   Heart disease Father        Pt does not know details   Stroke Neg Hx    Diabetes Neg Hx     Allergies  Allergen Reactions   Plavix [Clopidogrel Bisulfate] Swelling   Brilinta [Ticagrelor] Cough   Enalapril Other (See Comments)    Current  Outpatient Medications on File Prior to Visit  Medication Sig Dispense Refill   acetaminophen (TYLENOL) 650 MG CR tablet Take 650 mg by mouth every 8 (eight) hours as needed for pain.     Aromatic Inhalants (VICKS VAPOR INHALER IN) Inhale 1 Inhaler into the lungs as needed (stuffy nose).     Cholecalciferol (D3 EXTRA STRENGTH) 125 MCG (5000 UT) CAPS Take 1 capsule by mouth daily.     cyanocobalamin (VITAMIN B12) 1000 MCG tablet Take 1,000 mcg by mouth daily.     docusate sodium (COLACE) 100 MG capsule Take 100 mg by mouth 2 (two) times daily.     doxazosin (CARDURA) 8 MG tablet Take 8 mg by mouth daily.     furosemide (LASIX) 20 MG tablet TAKE 2 TABLET EVERY OTHER DAY AND 1 TABLET ON ODD DAYS. MAY TAKE AN ADDITIONAL TABLET FOR WEIGHT GAIN OF 3LBS OVERNIGHT OR INCREASED SWELLING 200 tablet 3   losartan (COZAAR) 50 MG tablet Take 1 tablet (50 mg total) by mouth daily. 30 tablet 0   magnesium oxide (MAG-OX) 400 (240 Mg) MG tablet Take 400 mg by mouth daily.     metoprolol tartrate (LOPRESSOR) 25 MG tablet Take 0.5 tablets (12.5 mg total) by mouth 2 (two) times daily. 30 tablet 3   nitroGLYCERIN (NITROSTAT) 0.6 MG SL tablet Place 0.6 mg under the tongue as needed.     prasugrel (EFFIENT) 5 MG TABS tablet Take 1 tablet (5 mg total) by mouth daily. 90 tablet 0   rosuvastatin (CRESTOR) 20 MG tablet Take 1 tablet (20 mg total) by mouth daily. 90 tablet 3   [START ON 06/06/2023] traMADol (ULTRAM) 50 MG tablet Take 1 tablet (50 mg total) by mouth 3 (three) times daily as needed. 1 tab po every 8 hours prn severe pain. 90 tablet 0   [START ON 08/05/2023] traMADol (ULTRAM) 50 MG tablet Take 1 tablet (50 mg total) by mouth 3 (three) times daily as needed. 1 tab po every 8 hours prn severe pain. 90 tablet 0   No current facility-administered medications on file prior to visit.    BP 110/67 (BP Location: Right Arm, Patient Position: Sitting, Cuff Size: Normal)   Pulse 62  Temp 98.7 F (37.1 C) (Oral)    Resp 16   Ht 5\' 10"  (1.778 m)   Wt 209 lb (94.8 kg)   SpO2 98%   BMI 29.99 kg/m       Objective:   Physical Exam  General Mental Status- Alert. General Appearance- Not in acute distress.    Skin General: Color- Normal Color. Moisture- Normal Moisture.   Neck Carotid Arteries- Normal color. Moisture- Normal Moisture. No carotid bruits. No JVD.   Chest and Lung Exam Auscultation: Breath Sounds:-Normal.   Cardiovascular Auscultation:Rythm- Regular. Murmurs & Other Heart Sounds:Auscultation of the heart reveals- No Murmurs.   Abdomen Inspection:-Inspeection Normal. Palpation/Percussion:Note:No mass. Palpation and Percussion of the abdomen reveal- Non Tender, Non Distended + BS, no rebound or guarding.   Neurologic Cranial Nerve exam:- CN III-XII intact(No nystagmus), symmetric smile. Strength:- 5/5 equal and symmetric strength both upper and lower extremities.    Feet- thick distrophic nails bilaterally.      Assessment & Plan:   Assessment and Plan    Chronic Back Pain Longstanding issue, exacerbated by lying down. Pain partially controlled with Tramadol, but patient reports needing to take it every 6 hours (4 times a day) for adequate relief. No reported side effects from Tramadol. -Reach out to physical medicine doctor to discuss patient's Tramadol use explain you desire every 6 hour use as you had used every 6 hours in past with no side effect and better pain control       Htn- bp well controlled. Continue losartan, metoprolol and lasix.   Bph- continue  cardura 8 mg daily.   Hyperlipidemia- continue crestor 20 mg daily.  Defer lab to cardiologist as you will be seeing cardiologist next week.  Follow up in 6 months or sooner if needed.  Esperanza Richters, PA-C

## 2023-05-28 NOTE — Telephone Encounter (Signed)
Dr. Shearon Stalls,  You saw Noah Scott late August. I am seeing pt today and he is requesting to use tramadol 50 mg every 6 hours. He states he had used before in past and got better pain control. No adverse side effect with q 6 hours use. Wanted to pass this message/request to you for patient. Thanks for seeing our patients.  Ramon Dredge

## 2023-05-29 ENCOUNTER — Other Ambulatory Visit (INDEPENDENT_AMBULATORY_CARE_PROVIDER_SITE_OTHER): Payer: Medicare Other

## 2023-05-29 ENCOUNTER — Telehealth: Payer: Self-pay

## 2023-05-29 ENCOUNTER — Ambulatory Visit (INDEPENDENT_AMBULATORY_CARE_PROVIDER_SITE_OTHER): Payer: Medicare Other | Admitting: Orthopedic Surgery

## 2023-05-29 DIAGNOSIS — M25561 Pain in right knee: Secondary | ICD-10-CM

## 2023-05-29 DIAGNOSIS — M79604 Pain in right leg: Secondary | ICD-10-CM

## 2023-05-29 DIAGNOSIS — M170 Bilateral primary osteoarthritis of knee: Secondary | ICD-10-CM | POA: Diagnosis not present

## 2023-05-29 DIAGNOSIS — M25562 Pain in left knee: Secondary | ICD-10-CM

## 2023-05-29 DIAGNOSIS — M79605 Pain in left leg: Secondary | ICD-10-CM

## 2023-05-29 NOTE — Telephone Encounter (Signed)
Patient needs precert for bilateral gel injections. Dr.Dean's patient. Thanks!

## 2023-05-31 ENCOUNTER — Encounter: Payer: Self-pay | Admitting: Orthopedic Surgery

## 2023-05-31 ENCOUNTER — Ambulatory Visit: Payer: Medicare Other | Attending: Physical Medicine and Rehabilitation | Admitting: Physical Therapy

## 2023-05-31 DIAGNOSIS — M79605 Pain in left leg: Secondary | ICD-10-CM | POA: Diagnosis present

## 2023-05-31 DIAGNOSIS — M6281 Muscle weakness (generalized): Secondary | ICD-10-CM | POA: Diagnosis present

## 2023-05-31 DIAGNOSIS — M79604 Pain in right leg: Secondary | ICD-10-CM | POA: Insufficient documentation

## 2023-05-31 DIAGNOSIS — Z9889 Other specified postprocedural states: Secondary | ICD-10-CM | POA: Insufficient documentation

## 2023-05-31 DIAGNOSIS — M5459 Other low back pain: Secondary | ICD-10-CM | POA: Diagnosis present

## 2023-05-31 DIAGNOSIS — M48062 Spinal stenosis, lumbar region with neurogenic claudication: Secondary | ICD-10-CM | POA: Diagnosis not present

## 2023-05-31 DIAGNOSIS — R2689 Other abnormalities of gait and mobility: Secondary | ICD-10-CM | POA: Insufficient documentation

## 2023-05-31 NOTE — Progress Notes (Unsigned)
Office Visit Note   Patient: Noah Scott           Date of Birth: August 31, 1936           MRN: [id number] Visit Date: 05/29/2023 Requested by: Angelina Sheriff, DO [address] [place],  [place] [address] PCP: Marisue Brooklyn  Subjective: Chief Complaint  Patient presents with   Left Leg - Pain   Right Leg - Pain    HPI: Noah Scott is a 87 y.o. male who presents to the office reporting bilateral leg pain and tightness.  Patient does walk with a cane.  He states his legs always feel tight.  Hard for him to sleep on his back.  Uses a pillow between his legs.  Takes tramadol for his symptoms.  He is not happy with pain management of his spine surgeons office.  He describes a burning type pain in his legs.  He states he had a nerve conduction study which was "fine".  His old notes are reviewed.  He had what sounds like decompressive back surgery about 3 years ago.  No prior ESI's before that procedure by patient history.  He states that since his surgery he has overall been much worse.  He does localize a lot of pain in his knees.  Patient states that he has become more bowlegged..                ROS: All systems reviewed are negative as they relate to the chief complaint within the history of present illness.  Patient denies fevers or chills.  Assessment & Plan: Visit Diagnoses:  1. Pain in left leg   2. Pain in right leg   3. Pain in both knees, unspecified chronicity     Plan: Impression is complex pain symptoms in a patient who has had prior back surgery and appears to have continued ongoing back symptoms.  He also has significant knee arthritis with varus alignment in both knees.  Plan is bilateral knee cortisone injections and we will approve gel for both knees as well. This patient is diagnosed with osteoarthritis of the knee(s).    Radiographs show evidence of joint space narrowing, osteophytes, subchondral sclerosis and/or subchondral cysts.  This patient has  knee pain which interferes with functional and activities of daily living.    This patient has experienced inadequate response, adverse effects and/or intolerance with conservative treatments such as acetaminophen, NSAIDS, topical creams, physical therapy or regular exercise, knee bracing and/or weight loss.   This patient has experienced inadequate response or has a contraindication to intra articular steroid injections for at least 3 months.   This patient is not scheduled to have a total knee replacement within 6 months of starting treatment with viscosupplementation.   Follow-Up Instructions: No follow-ups on file.   Orders:  Orders Placed This Encounter  Procedures   XR Lumbar Spine 2-3 Views   XR KNEE 3 VIEW RIGHT   XR Knee 1-2 Views Left   No orders of the defined types were placed in this encounter.     Procedures: Large Joint Inj: bilateral knee on 05/29/2023 5:26 PM Indications: diagnostic evaluation, joint swelling and pain Details: 18 G 1.5 in needle, superolateral approach  Arthrogram: No  Medications (Right): 5 mL lidocaine 1 %; 4 mL bupivacaine 0.25 %; 40 mg methylPREDNISolone acetate 40 MG/ML Medications (Left): 5 mL lidocaine 1 %; 4 mL bupivacaine 0.25 %; 40 mg methylPREDNISolone acetate 40 MG/ML Outcome: tolerated well, no immediate  complications Procedure, treatment alternatives, risks and benefits explained, specific risks discussed. Consent was given by the patient. Immediately prior to procedure a time out was called to verify the correct patient, procedure, equipment, support staff and site/side marked as required. Patient was prepped and draped in the usual sterile fashion.       Clinical Data: No additional findings.  Objective: Vital Signs: There were no vitals taken for this visit.  Physical Exam:  Constitutional: Patient appears well-developed HEENT:  Head: Normocephalic Eyes:EOM are normal Neck: Normal range of motion Cardiovascular:  Normal rate Pulmonary/chest: Effort normal Neurologic: Patient is alert Skin: Skin is warm Psychiatric: Patient has normal mood and affect  Ortho Exam: Ortho exam demonstrates palpable pedal pulses bilaterally.  Varus alignment bilateral lower extremities.  Has about a 5 to 7 degree flexion contracture in both knees.  Is able to bend past 90 with stable collateral cruciate ligaments.  Extensor mechanism intact.  No masses lymphadenopathy or skin changes noted in the knee region.  No muscle atrophy.  Has 5 out of 5 ankle dorsiflexion plantarflexion quad hamstring and hip flexion strength.  Specialty Comments:  No specialty comments available.  Imaging: No results found.   PMFS History: Patient Active Problem List   Diagnosis Date Noted   Chronic pain of both knees 05/13/2023   Chronic pain syndrome 05/13/2023   Iron deficiency anemia due to chronic blood loss 08/08/2021   Iron malabsorption 08/08/2021   Dilatation of thoracic aorta (HCC) 05/23/2021   SOB (shortness of breath) 02/20/2021   Bilateral leg edema 02/20/2021   Coronary artery disease involving native coronary artery of native heart 02/20/2021   Leg pain, bilateral 09/20/2020   Fluttering heart 09/20/2020   Episodic lightheadedness 09/20/2020   Dyspnea 11/26/2018   S/P lumbar laminectomy 06/13/2018   Preoperative clearance 03/15/2018   Metabolic syndrome 03/15/2018   Obesity (BMI 30.0-34.9) 03/15/2018   Neurogenic claudication due to lumbar spinal stenosis 02/06/2018   NSTEMI (non-ST elevated myocardial infarction) (HCC) 11/21/2017   Essential (primary) hypertension 11/21/2017   Hyperlipidemia with target LDL less than 70 11/21/2017   CAD S/P percutaneous coronary angioplasty 09/18/1999   Past Medical History:  Diagnosis Date   CAD S/P percutaneous coronary angioplasty 2001   a) 2001 ([place]): PCI OM1 - now with ~65% ISR.;; b) NSTEMI 11/2017 - ost-prox RCA 75-65% (Overlapping ONYX DES 3.0 x 26 & 3.0 x 8 -->  3.5-3.3 mm). m-dRCA 95% (ONYX DES 2.75 x 18 mm -> 3.0 mm). Ost OM1 65% ISR (med Rx). dRCA-OstRDA (50%-25%)   Essential (primary) hypertension 11/21/2017   Hyperlipidemia 11/21/2017   Iron deficiency anemia due to chronic blood loss 08/08/2021   Iron malabsorption 08/08/2021   Neurogenic claudication due to lumbar spinal stenosis 02/06/2018   NSTEMI (non-ST elevated myocardial infarction) (HCC) 11/21/2017   Multiple RCA lesions - 2 Overlapping DES Ost-Prox RCA & 1 in mid-distal RCA   Spinal stenosis of lumbar region    Venous reflux 11/2020   Venous Dopplers 12/15/2020: No DVT or superficial venous thrombosis bilaterally.  Venous reflux noted in bilateral FV and saphenofemoral junctions, R GSV in the thigh and R SFV, left proximal thigh GSV. -->  No invasive options due to deep and superficial venous reflux.    Family History  Problem Relation Age of Onset   Heart disease Father        Pt does not know details   Stroke Neg Hx    Diabetes Neg Hx     Past Surgical  History:  Procedure Laterality Date   CORONARY STENT INTERVENTION N/A 11/25/2017   Procedure: CORONARY STENT INTERVENTION;  Surgeon: Marykay Lex, MD;  Location: [facility] INVASIVE CV LAB;  Service: Cardiovascular:   m-dRCA 95%  (DES PCI - 0%.  Resolute ONYX DES 2.75 x 18 mm - ~3.0 mm). Ost-proxRCA 75-65% (DES PCI-0%. 2 overlapping Resolute ONYX DES 3.0 x 26 & 3.0 x 8 mm --> 3.5-3.3 mm)   CORONARY STENT INTERVENTION  2001   ([place]): PCI OM1   LEA DOPPLERS  12/12/2017   Normal bilateral ABIs and TBI's.   LEFT HEART CATH AND CORONARY ANGIOGRAPHY N/A 11/25/2017   Procedure: LEFT HEART CATH AND CORONARY ANGIOGRAPHY;  Surgeon: Marykay Lex, MD;  Location: [facility] INVASIVE CV LAB;  Service: Cardiovascular:  Ost-proxRCA 75&65%(DES PCI), m-dRCA 95% (DES PCI), dRCA 50%-25% ost rPDA. ostOM1 65% ISR (from 2001).    LUMBAR LAMINECTOMY/DECOMPRESSION MICRODISCECTOMY Bilateral 06/13/2018   Procedure: Laminectomy and Foraminotomy - L1-L2 -  L2-L3 - L3-L4 - bilateral;  Surgeon: Tia Alert, MD;  Location: [facility] OR;  Service: Neurosurgery;  Laterality: Bilateral;   TRANSTHORACIC ECHOCARDIOGRAM  11/23/2017   a) 11/2017: Nl LV size & Fxn. EF 60 - 65%.  No RWMA.  GR 1 DD.  Mild RV dilation.; b) 05/2020:([facility]): Normal LV size and function.  EF 60 and 65%.  Normal RV size and function.  Aortic sclerosis with no stenosis.  No AI.  Mildly dilated ascending aorta.  CVP ~5 mm.   TRANSTHORACIC ECHOCARDIOGRAM  03/31/2021   EF 55 to 60%.  GR 1 DD.  Ascending aorta measured at 41 mm-moderate dilation.  Recommended follow-up with imaging study in 6 months to reassess.   Social History   Occupational History   Occupation: retired Geologist, engineering  Tobacco Use   Smoking status: Former    Current packs/day: 0.00    Types: Cigarettes    Quit date: 03/02/1969    Years since quitting: 54.2   Smokeless tobacco: Never  Vaping Use   Vaping status: Never Used  Substance and Sexual Activity   Alcohol use: Not Currently   Drug use: No   Sexual activity: Not on file

## 2023-05-31 NOTE — Therapy (Incomplete)
OUTPATIENT PHYSICAL THERAPY THORACOLUMBAR EVALUATION   Patient Name: Noah Scott MRN: [id number] DOB:1936-07-05, 87 y.o., male Today's Date: 05/31/2023  END OF SESSION:  PT End of Session - 05/31/23 1404     Visit Number 1    Number of Visits 5   with eval   Date for PT Re-Evaluation 07/12/23    Authorization Type Medicare    PT Start Time 1402    PT Stop Time 1447    PT Time Calculation (min) 45 min    Activity Tolerance Patient tolerated treatment well    Behavior During Therapy [facility] for tasks assessed/performed             Past Medical History:  Diagnosis Date   CAD S/P percutaneous coronary angioplasty 2001   a) 2001 ([place]): PCI OM1 - now with ~65% ISR.;; b) NSTEMI 11/2017 - ost-prox RCA 75-65% (Overlapping ONYX DES 3.0 x 26 & 3.0 x 8 --> 3.5-3.3 mm). m-dRCA 95% (ONYX DES 2.75 x 18 mm -> 3.0 mm). Ost OM1 65% ISR (med Rx). dRCA-OstRDA (50%-25%)   Essential (primary) hypertension 11/21/2017   Hyperlipidemia 11/21/2017   Iron deficiency anemia due to chronic blood loss 08/08/2021   Iron malabsorption 08/08/2021   Neurogenic claudication due to lumbar spinal stenosis 02/06/2018   NSTEMI (non-ST elevated myocardial infarction) (HCC) 11/21/2017   Multiple RCA lesions - 2 Overlapping DES Ost-Prox RCA & 1 in mid-distal RCA   Spinal stenosis of lumbar region    Venous reflux 11/2020   Venous Dopplers 12/15/2020: No DVT or superficial venous thrombosis bilaterally.  Venous reflux noted in bilateral FV and saphenofemoral junctions, R GSV in the thigh and R SFV, left proximal thigh GSV. -->  No invasive options due to deep and superficial venous reflux.   Past Surgical History:  Procedure Laterality Date   CORONARY STENT INTERVENTION N/A 11/25/2017   Procedure: CORONARY STENT INTERVENTION;  Surgeon: Marykay Lex, MD;  Location: [facility] INVASIVE CV LAB;  Service: Cardiovascular:   m-dRCA 95%  (DES PCI - 0%.  Resolute ONYX DES 2.75 x 18 mm - ~3.0 mm). Ost-proxRCA 75-65% (DES  PCI-0%. 2 overlapping Resolute ONYX DES 3.0 x 26 & 3.0 x 8 mm --> 3.5-3.3 mm)   CORONARY STENT INTERVENTION  2001   ([place]): PCI OM1   LEA DOPPLERS  12/12/2017   Normal bilateral ABIs and TBI's.   LEFT HEART CATH AND CORONARY ANGIOGRAPHY N/A 11/25/2017   Procedure: LEFT HEART CATH AND CORONARY ANGIOGRAPHY;  Surgeon: Marykay Lex, MD;  Location: [facility] INVASIVE CV LAB;  Service: Cardiovascular:  Ost-proxRCA 75&65%(DES PCI), m-dRCA 95% (DES PCI), dRCA 50%-25% ost rPDA. ostOM1 65% ISR (from 2001).    LUMBAR LAMINECTOMY/DECOMPRESSION MICRODISCECTOMY Bilateral 06/13/2018   Procedure: Laminectomy and Foraminotomy - L1-L2 - L2-L3 - L3-L4 - bilateral;  Surgeon: Tia Alert, MD;  Location: [facility] OR;  Service: Neurosurgery;  Laterality: Bilateral;   TRANSTHORACIC ECHOCARDIOGRAM  11/23/2017   a) 11/2017: Nl LV size & Fxn. EF 60 - 65%.  No RWMA.  GR 1 DD.  Mild RV dilation.; b) 05/2020:([facility]): Normal LV size and function.  EF 60 and 65%.  Normal RV size and function.  Aortic sclerosis with no stenosis.  No AI.  Mildly dilated ascending aorta.  CVP ~5 mm.   TRANSTHORACIC ECHOCARDIOGRAM  03/31/2021   EF 55 to 60%.  GR 1 DD.  Ascending aorta measured at 41 mm-moderate dilation.  Recommended follow-up with imaging study in 6 months to reassess.   Patient  Active Problem List   Diagnosis Date Noted   Chronic pain of both knees 05/13/2023   Chronic pain syndrome 05/13/2023   Iron deficiency anemia due to chronic blood loss 08/08/2021   Iron malabsorption 08/08/2021   Dilatation of thoracic aorta (HCC) 05/23/2021   SOB (shortness of breath) 02/20/2021   Bilateral leg edema 02/20/2021   Coronary artery disease involving native coronary artery of native heart 02/20/2021   Leg pain, bilateral 09/20/2020   Fluttering heart 09/20/2020   Episodic lightheadedness 09/20/2020   Dyspnea 11/26/2018   S/P lumbar laminectomy 06/13/2018   Preoperative clearance 03/15/2018   Metabolic syndrome 03/15/2018   Obesity  (BMI 30.0-34.9) 03/15/2018   Neurogenic claudication due to lumbar spinal stenosis 02/06/2018   NSTEMI (non-ST elevated myocardial infarction) (HCC) 11/21/2017   Essential (primary) hypertension 11/21/2017   Hyperlipidemia with target LDL less than 70 11/21/2017   CAD S/P percutaneous coronary angioplasty 09/18/1999    PCP: Marisue Brooklyn  REFERRING PROVIDER: Angelina Sheriff, DO  REFERRING DIAG: M79.604,M79.605 (ICD-10-CM) - Leg pain, bilateral Z98.890 (ICD-10-CM) - S/P lumbar laminectomy M48.062 (ICD-10-CM) - Neurogenic claudication due to lumbar spinal stenosis  Rationale for Evaluation and Treatment: Rehabilitation  THERAPY DIAG:  Muscle weakness (generalized)  Other abnormalities of gait and mobility  Other low back pain  Pain in left leg  Pain in right leg  ONSET DATE: 05/13/2023 (referral date)  SUBJECTIVE:                                                                                                                                                                                           SUBJECTIVE STATEMENT: *** Pt reports that he had a back surgery about 4 years ago and has ongoing pain in his low back and into his legs, pain in legs feels like "tightness" and "burning" at times. Pt has difficulty laying down on his back to sleep and can feel the pain with walking, pain will decrease if he sits down for a few seconds. Pt is understanding that he will not be pain-free but is looking to decrease his pain.  Pt had a new xray done recently by Dr. Yetta Barre***, had B knee injections done on Wed due to OA  Pt does use a SPC intermittently for community mobility, no AD in the home. Pt reports he stays very active, does leg exercises first time in the morning as well as back stretches. Pt also does upper body strengthening with resistance bands. Pt stays active during the day working on cars, doing home repairs for himself and his daughter, etc.  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  I'm tight and stiff***   PERTINENT HISTORY:  past medical history of CAD S/P percutaneous coronary angioplasty, HTN, HLD, Iron deficiency anemia due to chronic blood loss, Iron malabsorption, Neurogenic claudication due to lumbar spinal stenosis, NSTEMI, Spinal stenosis of lumbar region, and Venous reflux  PAIN:  Are you having pain? Yes: NPRS scale: ***/10 Pain location: low back and down into legs Pain description: burning Aggravating factors: laying down flat Relieving factors: sitting down for a short period of time, tramadol brings it down a bit, taking pill for edema helps  PRECAUTIONS: None  RED FLAGS: None   WEIGHT BEARING RESTRICTIONS: No  FALLS:  Has patient fallen in last 6 months? No  LIVING ENVIRONMENT: Lives with: lives alone Lives in: House/apartment Stairs: Yes: Internal: 12+ steps; can reach both Has following equipment at home: Single point cane  OCCUPATION: ***retired Personnel officer  PLOF: Independent with gait and Independent with transfers  PATIENT GOALS: ***"I don't know"  NEXT MD VISIT: ***Dr. Shearon Stalls 08/14/2023  OBJECTIVE:   DIAGNOSTIC FINDINGS:  Lumbar Spine Xray 05/29/2023, results pending  PATIENT SURVEYS:  Modified Oswestry ***   SCREENING FOR RED FLAGS: Bowel or bladder incontinence: {Yes/No:304960894} Spinal tumors: {Yes/No:304960894} Cauda equina syndrome: {Yes/No:304960894} Compression fracture: {Yes/No:304960894} Abdominal aneurysm: {Yes/No:304960894}  COGNITION: Overall cognitive status: Within functional limits for tasks assessed     SENSATION: {sensation:27233} WFL  MUSCLE LENGTH: Hamstrings: Right *** deg; Left *** deg Jasraj test: Right *** deg; Left *** deg  POSTURE: forward head  PALPATION: ***  LUMBAR ROM:   AROM eval  Flexion WFL "tightness"  Extension WFL  Right lateral flexion 75%, pain across low back  Left lateral flexion 75%, pain across low back   Right rotation WFL, "stiff"  Left rotation WFL, "stiff"   (Blank rows = not tested)  LOWER EXTREMITY ROM:     {AROM/PROM:27142}  Right eval Left eval  Hip flexion    Hip extension    Hip abduction    Hip adduction    Hip internal rotation    Hip external rotation    Knee flexion    Knee extension    Ankle dorsiflexion    Ankle plantarflexion    Ankle inversion    Ankle eversion     (Blank rows = not tested)  LOWER EXTREMITY MMT:    MMT Right eval Left eval  Hip flexion 5 5  Hip extension    Hip abduction    Hip adduction    Hip internal rotation    Hip external rotation    Knee flexion 5 5  Knee extension 5 5  Ankle dorsiflexion 5 5  Ankle plantarflexion    Ankle inversion    Ankle eversion     (Blank rows = not tested)  LUMBAR SPECIAL TESTS:  Slump test: Positive "Tightness" in legs, feels like the tightness and stiffness in his legs that limits him from stnading still for too long  FUNCTIONAL TESTS:  {Functional tests:24029}  GAIT: Distance walked: *** Assistive device utilized: {Assistive devices:23999} Level of assistance: {Levels of assistance:24026} Comments: ***  TODAY'S TREATMENT:  PT Evaluation    PATIENT EDUCATION:  Education details: *** Person educated: {Person educated:25204} Education method: {Education Method:25205} Education comprehension: {Education Comprehension:25206}  HOME EXERCISE PROGRAM: To be initiated  ASSESSMENT:  CLINICAL IMPRESSION: Patient is a *** y.o. *** who was seen today for physical therapy evaluation and treatment for ***.   Patient is a *** year old *** referred to Neuro OPPT for***.   Pt's PMH is significant for: *** The following deficits were present during the exam: ***. Based on ***, pt is an incr risk for falls. Pt would benefit from skilled PT to address these impairments and  functional limitations to maximize functional mobility independence   OBJECTIVE IMPAIRMENTS: {opptimpairments:25111}.   ACTIVITY LIMITATIONS: {activitylimitations:27494}  PARTICIPATION LIMITATIONS: {participationrestrictions:25113}  PERSONAL FACTORS: {Personal factors:25162} are also affecting patient's functional outcome.   REHAB POTENTIAL: {rehabpotential:25112}  CLINICAL DECISION MAKING: {clinical decision making:25114}  EVALUATION COMPLEXITY: {Evaluation complexity:25115}   GOALS: Goals reviewed with patient? {yes/no:20286}  SHORT TERM GOALS: Target date: ***  *** Baseline: Goal status: INITIAL  2.  *** Baseline:  Goal status: INITIAL  3.  *** Baseline:  Goal status: INITIAL  4.  *** Baseline:  Goal status: INITIAL  5.  *** Baseline:  Goal status: INITIAL  6.  *** Baseline:  Goal status: INITIAL  LONG TERM GOALS: Target date: ***  *** Baseline:  Goal status: INITIAL  2.  *** Baseline:  Goal status: INITIAL  3.  *** Baseline:  Goal status: INITIAL  4.  *** Baseline:  Goal status: INITIAL  5.  *** Baseline:  Goal status: INITIAL  6.  *** Baseline:  Goal status: INITIAL  PLAN:  PT FREQUENCY: {rehab frequency:25116}  PT DURATION: {rehab duration:25117}  PLANNED INTERVENTIONS: {rehab planned interventions:25118::"Therapeutic exercises","Therapeutic activity","Neuromuscular re-education","Balance training","Gait training","Patient/Family education","Self Care","Joint mobilization"}.  PLAN FOR NEXT SESSION: ***    Peter Congo, PT, DPT, CSRS  05/31/2023, 2:47 PM

## 2023-06-01 MED ORDER — LIDOCAINE HCL 1 % IJ SOLN
5.0000 mL | INTRAMUSCULAR | Status: AC | PRN
Start: 2023-05-29 — End: 2023-05-29
  Administered 2023-05-29: 5 mL

## 2023-06-01 MED ORDER — METHYLPREDNISOLONE ACETATE 40 MG/ML IJ SUSP
40.0000 mg | INTRAMUSCULAR | Status: AC | PRN
Start: 2023-05-29 — End: 2023-05-29
  Administered 2023-05-29: 40 mg via INTRA_ARTICULAR

## 2023-06-01 MED ORDER — BUPIVACAINE HCL 0.25 % IJ SOLN
4.0000 mL | INTRAMUSCULAR | Status: AC | PRN
Start: 2023-05-29 — End: 2023-05-29
  Administered 2023-05-29: 4 mL via INTRA_ARTICULAR

## 2023-06-04 NOTE — Telephone Encounter (Signed)
VOB submitted for Durolane, bilateral knee  

## 2023-06-06 ENCOUNTER — Ambulatory Visit (INDEPENDENT_AMBULATORY_CARE_PROVIDER_SITE_OTHER): Payer: Medicare Other | Admitting: Podiatry

## 2023-06-06 ENCOUNTER — Encounter: Payer: Self-pay | Admitting: Podiatry

## 2023-06-06 DIAGNOSIS — M79674 Pain in right toe(s): Secondary | ICD-10-CM

## 2023-06-06 DIAGNOSIS — B351 Tinea unguium: Secondary | ICD-10-CM | POA: Diagnosis not present

## 2023-06-06 DIAGNOSIS — M79675 Pain in left toe(s): Secondary | ICD-10-CM

## 2023-06-06 NOTE — Progress Notes (Signed)
This patient returns to the office for evaluation and treatment of long thick painful nails .  This patient is unable to trim his own nails since the patient cannot reach his feet.  Patient says the nails are painful walking and wearing his shoes.  He returns for preventive foot care services.  General Appearance  Alert, conversant and in no acute stress.  Vascular  Dorsalis pedis and posterior tibial  pulses are palpable  bilaterally.  Capillary return is within normal limits  bilaterally. Temperature is within normal limits  bilaterally.  Neurologic  Senn-Weinstein monofilament wire test within normal limits  bilaterally. Muscle power within normal limits bilaterally.  Nails Thick disfigured discolored nails with subungual debris  from hallux to fifth toes bilaterally. No evidence of bacterial infection or drainage bilaterally.  Orthopedic  No limitations of motion  feet .  No crepitus or effusions noted.  No bony pathology or digital deformities noted.  HAV  B/L.  Skin  normotropic skin with no porokeratosis noted bilaterally.  No signs of infections or ulcers noted.     Onychomycosis  Pain in toes right foot  Pain in toes left foot  Debridement  of nails  1-5  B/L with a nail nipper.  Nails were then filed using a dremel tool with no incidents.    RTC  3 months   Helane Gunther DPM

## 2023-06-10 ENCOUNTER — Ambulatory Visit: Payer: Medicare Other | Admitting: Physical Therapy

## 2023-06-10 DIAGNOSIS — M5459 Other low back pain: Secondary | ICD-10-CM

## 2023-06-10 DIAGNOSIS — M6281 Muscle weakness (generalized): Secondary | ICD-10-CM

## 2023-06-10 DIAGNOSIS — M79604 Pain in right leg: Secondary | ICD-10-CM

## 2023-06-10 DIAGNOSIS — M79605 Pain in left leg: Secondary | ICD-10-CM

## 2023-06-10 DIAGNOSIS — R2689 Other abnormalities of gait and mobility: Secondary | ICD-10-CM

## 2023-06-10 NOTE — Therapy (Signed)
OUTPATIENT PHYSICAL THERAPY THORACOLUMBAR EVALUATION   Patient Name: Noah Scott MRN: [id number] DOB:1936/03/24, 87 y.o., male Today's Date: 06/10/2023  END OF SESSION:  PT End of Session - 06/10/23 1445     Visit Number 2    Number of Visits 5   with eval   Date for PT Re-Evaluation 07/12/23    Authorization Type Medicare    PT Start Time 1443    PT Stop Time 1533    PT Time Calculation (min) 50 min    Activity Tolerance Patient tolerated treatment well    Behavior During Therapy [facility] for tasks assessed/performed              Past Medical History:  Diagnosis Date   CAD S/P percutaneous coronary angioplasty 2001   a) 2001 ([place]): PCI OM1 - now with ~65% ISR.;; b) NSTEMI 11/2017 - ost-prox RCA 75-65% (Overlapping ONYX DES 3.0 x 26 & 3.0 x 8 --> 3.5-3.3 mm). m-dRCA 95% (ONYX DES 2.75 x 18 mm -> 3.0 mm). Ost OM1 65% ISR (med Rx). dRCA-OstRDA (50%-25%)   Essential (primary) hypertension 11/21/2017   Hyperlipidemia 11/21/2017   Iron deficiency anemia due to chronic blood loss 08/08/2021   Iron malabsorption 08/08/2021   Neurogenic claudication due to lumbar spinal stenosis 02/06/2018   NSTEMI (non-ST elevated myocardial infarction) (HCC) 11/21/2017   Multiple RCA lesions - 2 Overlapping DES Ost-Prox RCA & 1 in mid-distal RCA   Spinal stenosis of lumbar region    Venous reflux 11/2020   Venous Dopplers 12/15/2020: No DVT or superficial venous thrombosis bilaterally.  Venous reflux noted in bilateral FV and saphenofemoral junctions, R GSV in the thigh and R SFV, left proximal thigh GSV. -->  No invasive options due to deep and superficial venous reflux.   Past Surgical History:  Procedure Laterality Date   CORONARY STENT INTERVENTION N/A 11/25/2017   Procedure: CORONARY STENT INTERVENTION;  Surgeon: Marykay Lex, MD;  Location[facility] INVASIVE CV LAB;  Service: Cardiovascular:   m-dRCA 95%  (DES PCI - 0%.  Resolute ONYX DES 2.75 x 18 mm - ~3.0 mm). Ost-proxRCA 75-65% (DES  PCI-0%. 2 overlapping Resolute ONYX DES 3.0 x 26 & 3.0 x 8 mm --> 3.5-3.3 mm)   CORONARY STENT INTERVENTION  2001   ([place]): PCI OM1   LEA DOPPLERS  12/12/2017   Normal bilateral ABIs and TBI's.   LEFT HEART CATH AND CORONARY ANGIOGRAPHY N/A 11/25/2017   Procedure: LEFT HEART CATH AND CORONARY ANGIOGRAPHY;  Surgeon: Marykay Lex, MD;  Location: [facility] INVASIVE CV LAB;  Service: Cardiovascular:  Ost-proxRCA 75&65%(DES PCI), m-dRCA 95% (DES PCI), dRCA 50%-25% ost rPDA. ostOM1 65% ISR (from 2001).    LUMBAR LAMINECTOMY/DECOMPRESSION MICRODISCECTOMY Bilateral 06/13/2018   Procedure: Laminectomy and Foraminotomy - L1-L2 - L2-L3 - L3-L4 - bilateral;  Surgeon: Tia Alert, MD;  Location: [facility] OR;  Service: Neurosurgery;  Laterality: Bilateral;   TRANSTHORACIC ECHOCARDIOGRAM  11/23/2017   a) 11/2017: Nl LV size & Fxn. EF 60 - 65%.  No RWMA.  GR 1 DD.  Mild RV dilation.; b) 05/2020:([facility]): Normal LV size and function.  EF 60 and 65%.  Normal RV size and function.  Aortic sclerosis with no stenosis.  No AI.  Mildly dilated ascending aorta.  CVP ~5 mm.   TRANSTHORACIC ECHOCARDIOGRAM  03/31/2021   EF 55 to 60%.  GR 1 DD.  Ascending aorta measured at 41 mm-moderate dilation.  Recommended follow-up with imaging study in 6 months to reassess.  Patient Active Problem List   Diagnosis Date Noted   Chronic pain of both knees 05/13/2023   Chronic pain syndrome 05/13/2023   Iron deficiency anemia due to chronic blood loss 08/08/2021   Iron malabsorption 08/08/2021   Dilatation of thoracic aorta (HCC) 05/23/2021   SOB (shortness of breath) 02/20/2021   Bilateral leg edema 02/20/2021   Coronary artery disease involving native coronary artery of native heart 02/20/2021   Leg pain, bilateral 09/20/2020   Fluttering heart 09/20/2020   Episodic lightheadedness 09/20/2020   Dyspnea 11/26/2018   S/P lumbar laminectomy 06/13/2018   Preoperative clearance 03/15/2018   Metabolic syndrome 03/15/2018   Obesity  (BMI 30.0-34.9) 03/15/2018   Neurogenic claudication due to lumbar spinal stenosis 02/06/2018   NSTEMI (non-ST elevated myocardial infarction) (HCC) 11/21/2017   Essential (primary) hypertension 11/21/2017   Hyperlipidemia with target LDL less than 70 11/21/2017   CAD S/P percutaneous coronary angioplasty 09/18/1999    PCP: Marisue Brooklyn  REFERRING PROVIDER: Angelina Sheriff, DO  REFERRING DIAG: M79.604,M79.605 (ICD-10-CM) - Leg pain, bilateral Z98.890 (ICD-10-CM) - S/P lumbar laminectomy M48.062 (ICD-10-CM) - Neurogenic claudication due to lumbar spinal stenosis  Rationale for Evaluation and Treatment: Rehabilitation  THERAPY DIAG:  Muscle weakness (generalized)  Other abnormalities of gait and mobility  Other low back pain  Pain in left leg  Pain in right leg  ONSET DATE: 05/13/2023 (referral date)  SUBJECTIVE:                                                                                                                                                                                           SUBJECTIVE STATEMENT:    Patient denies any falls or acute changes since last appointment.  Reports pain begins when lying supine in bed, only sleeping ~2 hours at a time before waking from pain or need for toileting. Back pain goes down bilateral legs, as well as bilateral tightness in hamstrings.  Reports not wearing compression stockings because they are too hard for him to get on by himself.   PERTINENT HISTORY:  past medical history of CAD S/P percutaneous coronary angioplasty, HTN, HLD, Iron deficiency anemia due to chronic blood loss, Iron malabsorption, Neurogenic claudication due to lumbar spinal stenosis, NSTEMI, Spinal stenosis of lumbar region, and Venous reflux  PAIN: "pretty good today" Are you having pain? Yes: NPRS scale: 8/10 Pain location: low back and down into legs Pain description: burning Aggravating factors: laying down flat Relieving factors:  sitting down for a short period of time, tramadol brings it down a bit, taking pill for edema helps    PRECAUTIONS: None  RED FLAGS: None  WEIGHT BEARING RESTRICTIONS: No  FALLS:  Has patient fallen in last 6 months? No  LIVING ENVIRONMENT: Lives with: lives alone Lives in: House/apartment Stairs: Yes: Internal: 12+ steps; can reach both Has following equipment at home: Single point cane  OCCUPATION: retired Personnel officer  PLOF: Independent with gait and Independent with transfers  PATIENT GOALS: "I don't know"  NEXT MD VISIT: Dr. Shearon Stalls 08/14/2023  OBJECTIVE:   DIAGNOSTIC FINDINGS:  Lumbar Spine Xray 05/29/2023  AP lateral radiographs lumbar spine reviewed.  No hardware present.   Severe degenerative disc disease is present throughout the lumbar spine  with no acute compression fractures or spondylolisthesis.  PATIENT SURVEYS:  Modified Oswestry 28/50, severe disability   SCREENING FOR RED FLAGS: Bowel or bladder incontinence: No Spinal tumors: No Cauda equina syndrome: No Compression fracture: No Abdominal aneurysm: No  COGNITION: Overall cognitive status: Within functional limits for tasks assessed     SENSATION: WFL  POSTURE: forward head   LUMBAR ROM:   AROM eval  Flexion WFL "tightness"  Extension WFL  Right lateral flexion 75%, pain across low back  Left lateral flexion 75%, pain across low back  Right rotation WFL, "stiff"  Left rotation WFL, "stiff"   (Blank rows = not tested)  LOWER EXTREMITY ROM:     Active  Right eval Left eval  Hip flexion [facility]  Hip extension    Hip abduction    Hip adduction    Hip internal rotation    Hip external rotation    Knee flexion [facility] WFL  Knee extension Tight HS Tight HS  Ankle dorsiflexion Tight heel cords Tight heel cords  Ankle plantarflexion    Ankle inversion    Ankle eversion     (Blank rows = not tested)  LOWER EXTREMITY MMT:    MMT Right eval Left eval  Hip flexion 5 5  Hip  extension    Hip abduction    Hip adduction    Hip internal rotation    Hip external rotation    Knee flexion 5 5  Knee extension 5 5  Ankle dorsiflexion 5 5  Ankle plantarflexion    Ankle inversion    Ankle eversion     (Blank rows = not tested)  LUMBAR SPECIAL TESTS:  Slump test: Positive "Tightness" in legs, feels like the tightness and stiffness in his legs that limits him from standing still for too long  GAIT: Distance walked: various clinic distances Assistive device utilized: Single point cane Level of assistance: Modified independence Comments: mild antalgia  TODAY'S TREATMENT:                                                                                                                                Therapeutic Exercise Supine hamstring stretch x2 bilaterally with 30 second hold Verbal cueing to keep head relaxed and to hold the stretch instead of kicking pt denies numbness or burning sensation, just stretching that increases in  intensity as it is held L appears tighter than R Seated hamstring stretch with 30 second hold bilaterally Standing gastroc stretch with lunge to wall x2 bilaterally with 30 second hold Bilateral hands on wall for balance Supine figure 4 piriformis stretch on R with 30 second hold Pt reports feeling more in medial knee when performed on L Supine lumbar rotation x4 bilaterally with 30 second hold  Child's Pose x3 with 30 second hold Seated figure 4 piriformis stretch bilaterally with 30 second hold R more flexible than L Lumbar flexion and hip flexion roll out with blue exercise ball x3 with 30 second hold Pt reports no pain with this activity  Added appropriate exercises to HEP, see bolded below   PATIENT EDUCATION:  Education details: Given initial HEP, plan for next session Person educated: Patient Education method: Explanation, Demonstration, Verbal cues, and Handouts Education comprehension: verbalized understanding, returned  demonstration, and needs further education  HOME EXERCISE PROGRAM: Access Code: [email or web address] URL: [email or web address] Date: 06/10/2023 Prepared by: Peter Congo  Exercises - Seated Hamstring Stretch  - 1 x daily - 7 x weekly - 1 sets - 3-5 reps - 30 sec hold - Gastroc Stretch on Wall  - 1 x daily - 7 x weekly - 1 sets - 3-5 reps - Supine Lower Trunk Rotation  - 1 x daily - 7 x weekly - 1 sets - 10 reps - 5 sec hold - Child's Pose Stretch  - 1 x daily - 7 x weekly - 1 sets - 5-10 reps - 30 sec hold - Seated Piriformis Stretch with Trunk Bend  - 1 x daily - 7 x weekly - 1 sets - 3-5 reps - 30 sec hold  ASSESSMENT:  CLINICAL IMPRESSION: Emphasis of skilled PT session on initiating HEP to address lower back and BLE muscle tightness leading to increased pain. Trialed various stretches during session to determine most appropriate to add to HEP. Patient continues to benefit from skilled PT services to address his chronic pain and would benefit from focus on core strengthening next session.  Continue POC.    OBJECTIVE IMPAIRMENTS: Abnormal gait, decreased activity tolerance, difficulty walking, decreased ROM, impaired perceived functional ability, impaired flexibility, improper body mechanics, and pain.   ACTIVITY LIMITATIONS: carrying, lifting, bending, sitting, standing, and bed mobility  PARTICIPATION LIMITATIONS: community activity  PERSONAL FACTORS: Age, Past/current experiences, Time since onset of injury/illness/exacerbation, and 3+ comorbidities:    past medical history of CAD S/P percutaneous coronary angioplasty, HTN, HLD, Iron deficiency anemia due to chronic blood loss, Iron malabsorption, Neurogenic claudication due to lumbar spinal stenosis, NSTEMI, Spinal stenosis of lumbar region, and Venous refluxare also affecting patient's functional outcome.   REHAB POTENTIAL: Fair time since onset/chronicity of pain; has tried PT previously, buy-in  CLINICAL DECISION MAKING:  Stable/uncomplicated  EVALUATION COMPLEXITY: Low   GOALS: Goals reviewed with patient? Yes  SHORT TERM GOALS=LONG TERM GOALS due to length of POC   LONG TERM GOALS: Target date: 06/29/2023  Pt will be independent with final HEP for improved strength, transfers, gait and for increased independence with management of pain symptoms. Baseline: not established at eval Goal status: INITIAL  2.  Pt will improve score on the Oswestry to 23/50 to demonstrate decreased disability level Baseline: 28/50, severe disability (9/13) Goal status: INITIAL  3.  Pt will report </= 6/10 pain with ADLs at most to demonstrate improved function Baseline: 8/10 (9/13) Goal status: INITIAL   PLAN:  PT FREQUENCY: 1x/week  PT DURATION:  4 sessions  if needed  PLANNED INTERVENTIONS: Therapeutic exercises, Therapeutic activity, Neuromuscular re-education, Balance training, Gait training, Patient/Family education, Self Care, Joint mobilization, DME instructions, Aquatic Therapy, Dry Needling, Electrical stimulation, Spinal manipulation, Spinal mobilization, Cryotherapy, Moist heat, Taping, Manual therapy, and Re-evaluation.  PLAN FOR NEXT SESSION: initiate HEP for core strengthening and stretching (TA/posterior pelvic tilts, bridges, supine marches, dead bugs, quadruped bird dogs, child's pose) and LE stretches (HS, gastroc), LE and core strengthening   Beverely Low, SPT  Peter Congo, PT, DPT, CSRS  06/10/2023, 3:33 PM

## 2023-06-19 ENCOUNTER — Encounter: Payer: Self-pay | Admitting: Nurse Practitioner

## 2023-06-19 ENCOUNTER — Ambulatory Visit: Payer: Medicare Other | Admitting: Physical Medicine and Rehabilitation

## 2023-06-19 ENCOUNTER — Ambulatory Visit: Payer: Medicare Other | Attending: Student | Admitting: Nurse Practitioner

## 2023-06-19 VITALS — BP 126/64 | HR 62 | Ht 70.0 in | Wt 207.0 lb

## 2023-06-19 DIAGNOSIS — R600 Localized edema: Secondary | ICD-10-CM | POA: Diagnosis present

## 2023-06-19 DIAGNOSIS — I2510 Atherosclerotic heart disease of native coronary artery without angina pectoris: Secondary | ICD-10-CM | POA: Insufficient documentation

## 2023-06-19 DIAGNOSIS — I77810 Thoracic aortic ectasia: Secondary | ICD-10-CM | POA: Insufficient documentation

## 2023-06-19 DIAGNOSIS — E785 Hyperlipidemia, unspecified: Secondary | ICD-10-CM | POA: Insufficient documentation

## 2023-06-19 DIAGNOSIS — I10 Essential (primary) hypertension: Secondary | ICD-10-CM | POA: Insufficient documentation

## 2023-06-19 DIAGNOSIS — R002 Palpitations: Secondary | ICD-10-CM | POA: Insufficient documentation

## 2023-06-19 NOTE — Patient Instructions (Signed)
Medication Instructions:  Your physician recommends that you continue on your current medications as directed. Please refer to the Current Medication list given to you today.  *If you need a refill on your cardiac medications before your next appointment, please call your pharmacy*   Lab Work: BMET today   Testing/Procedures: Your physician has requested that you have cardiac CT. Cardiac computed tomography (CT) is a painless test that uses an x-ray machine to take clear, detailed pictures of your heart. For further information please visit [email or web address] Please follow instruction sheet as given.     Follow-Up: At [facility], you and your health needs are our priority.  As part of our continuing mission to provide you with exceptional heart care, we have created designated Provider Care Teams.  These Care Teams include your primary Cardiologist (physician) and Advanced Practice Providers (APPs -  Physician Assistants and Nurse Practitioners) who all work together to provide you with the care you need, when you need it.  We recommend signing up for the patient portal called "MyChart".  Sign up information is provided on this After Visit Summary.  MyChart is used to connect with patients for Virtual Visits (Telemedicine).  Patients are able to view lab/test results, encounter notes, upcoming appointments, etc.  Non-urgent messages can be sent to your provider as well.   To learn more about what you can do with MyChart, go to [email or web address].    Your next appointment:   6 month(s)  Provider:   Bryan Lemma, MD

## 2023-06-19 NOTE — Progress Notes (Signed)
Office Visit    Patient Name: Noah Scott Date of Encounter: 06/19/2023  Primary Care Provider:  Esperanza Richters, PA-C Primary Cardiologist:  Bryan Lemma, MD  Chief Complaint    87 year old male with a history of CAD s/p prior PCI-OM1, s/p DES x 2 overlapping-o-pRCA and DES-m-dRCA in 2019, dilation of ascending aorta, hypertension, hyperlipidemia, palpitations, lower extremity edema the setting of venous insufficiency, iron deficiency anemia, and obesity who presents for follow-up related to CAD.  Past Medical History    Past Medical History:  Diagnosis Date   CAD S/P percutaneous coronary angioplasty 2001   a) 2001 ([place]): PCI OM1 - now with ~65% ISR.;; b) NSTEMI 11/2017 - ost-prox RCA 75-65% (Overlapping ONYX DES 3.0 x 26 & 3.0 x 8 --> 3.5-3.3 mm). m-dRCA 95% (ONYX DES 2.75 x 18 mm -> 3.0 mm). Ost OM1 65% ISR (med Rx). dRCA-OstRDA (50%-25%)   Essential (primary) hypertension 11/21/2017   Hyperlipidemia 11/21/2017   Iron deficiency anemia due to chronic blood loss 08/08/2021   Iron malabsorption 08/08/2021   Neurogenic claudication due to lumbar spinal stenosis 02/06/2018   NSTEMI (non-ST elevated myocardial infarction) (HCC) 11/21/2017   Multiple RCA lesions - 2 Overlapping DES Ost-Prox RCA & 1 in mid-distal RCA   Spinal stenosis of lumbar region    Venous reflux 11/2020   Venous Dopplers 12/15/2020: No DVT or superficial venous thrombosis bilaterally.  Venous reflux noted in bilateral FV and saphenofemoral junctions, R GSV in the thigh and R SFV, left proximal thigh GSV. -->  No invasive options due to deep and superficial venous reflux.   Past Surgical History:  Procedure Laterality Date   CORONARY STENT INTERVENTION N/A 11/25/2017   Procedure: CORONARY STENT INTERVENTION;  Surgeon: Marykay Lex, MD;  Location: [facility] INVASIVE CV LAB;  Service: Cardiovascular:   m-dRCA 95%  (DES PCI - 0%.  Resolute ONYX DES 2.75 x 18 mm - ~3.0 mm). Ost-proxRCA 75-65% (DES PCI-0%. 2  overlapping Resolute ONYX DES 3.0 x 26 & 3.0 x 8 mm --> 3.5-3.3 mm)   CORONARY STENT INTERVENTION  2001   ([place]): PCI OM1   LEA DOPPLERS  12/12/2017   Normal bilateral ABIs and TBI's.   LEFT HEART CATH AND CORONARY ANGIOGRAPHY N/A 11/25/2017   Procedure: LEFT HEART CATH AND CORONARY ANGIOGRAPHY;  Surgeon: Marykay Lex, MD;  Location: [facility] INVASIVE CV LAB;  Service: Cardiovascular:  Ost-proxRCA 75&65%(DES PCI), m-dRCA 95% (DES PCI), dRCA 50%-25% ost rPDA. ostOM1 65% ISR (from 2001).    LUMBAR LAMINECTOMY/DECOMPRESSION MICRODISCECTOMY Bilateral 06/13/2018   Procedure: Laminectomy and Foraminotomy - L1-L2 - L2-L3 - L3-L4 - bilateral;  Surgeon: Tia Alert, MD;  Location: [facility];  Service: Neurosurgery;  Laterality: Bilateral;   TRANSTHORACIC ECHOCARDIOGRAM  11/23/2017   a) 11/2017: Nl LV size & Fxn. EF 60 - 65%.  No RWMA.  GR 1 DD.  Mild RV dilation.; b) 05/2020:([facility]): Normal LV size and function.  EF 60 and 65%.  Normal RV size and function.  Aortic sclerosis with no stenosis.  No AI.  Mildly dilated ascending aorta.  CVP ~5 mm.   TRANSTHORACIC ECHOCARDIOGRAM  03/31/2021   EF 55 to 60%.  GR 1 DD.  Ascending aorta measured at 41 mm-moderate dilation.  Recommended follow-up with imaging study in 6 months to reassess.    Allergies  Allergies  Allergen Reactions   Plavix [Clopidogrel Bisulfate] Swelling   Brilinta [Ticagrelor] Cough   Enalapril Other (See Comments)     Labs/Other Studies Reviewed  The following studies were reviewed today:  Cardiac Studies & Procedures   CARDIAC CATHETERIZATION  CARDIAC CATHETERIZATION 11/25/2017  Narrative Images from the original result were not included.   Culprit Lesion #1: Mid RCA to Dist RCA lesion is 95% stenosed.  A drug-eluting stent was successfully placed using a STENT RESOLUTE ONYX [id number]. Postdilated to almost 3.0 mm  Post intervention, there is a 0% residual stenosis.  Lesion set #2: Ost RCA to Prox RCA lesion is 75%  stenosed. Prox RCA lesion is 65% stenosed.  2 overlapping Drug-Eluting Stents were successfully placed using a STENT RESOLUTE ONYX 3.0X26 and a STENT RESOLUTE ONYX 3.0X26. Overlapping proximally (postdilated to 3.5 mill meters proximal down to 3.3 mm distal.  A drug-eluting stent was successfully placed using a  Ost 1st Mrg to 1st Mrg lesion is 65% stenosed. This is within a previously placed stent from 2000  LV end diastolic pressure is normal.  Dist RCA lesion is 50% stenosed with 25% stenosed side branch in Ost RPDA.  Severe 1-2 vessel disease with ostial-proximal RCA as well as mid RCA severe lesions as well as moderate to severe in-stent restenosis in OM1. Difficult, complicated PCI of 3 separate lesions in the RCA requiring use of extra-support buddy wire.  Plan medical management of the OM lesion, if necessary could bring back for staged PCI if symptoms not fully controlled.   The patient will be transferred to 6 [facility] post procedure unit for ongoing care and pneumatic band removal.  He is on aspirin plus Brilinta, would continue aspirin for at least 3-6 months, and continue Brilinta for minimum 1 year but consider prolonged Thienopyridine coverage.  Continue aggressive risk factor modification.  The patient's daughter was notified by telephone of the results, and the procedure was discussed with the rounding cardiologist (Dr. Mayford Knife).   Bryan Lemma, M.D., M.S. Interventional Cardiologist  Pager # [phone] Phone # [phone] [address] [place], [place] [address]  Findings Coronary Findings Diagnostic  Dominance: Right  Left Main Vessel is large.  Left Anterior Descending There is mild diffuse disease throughout the vessel.  First Diagonal Branch Vessel is small in size. The vessel exhibits minimal luminal irregularities.  Second Diagonal Branch Vessel is small in size.  Lateral Second Diagonal Branch Vessel is small in size. The vessel  exhibits minimal luminal irregularities.  Left Circumflex  First Obtuse Marginal Branch Vessel is large in size. Ost 1st Mrg to 1st Mrg lesion is 65% stenosed. The lesion is eccentric and irregular. The lesion was previously treated using a stent (unknown type) over 2 years ago. Previously placed stent displays restenosis. This is not the culprit lesion, but may very well be associated with symptoms.  If symptomatic on optimize medical management, could consider staged PCI.  Lateral First Obtuse Marginal Branch Vessel is small in size.  Second Obtuse Marginal Branch Vessel is small in size.  Third Obtuse Marginal Branch Vessel is small in size.  Right Coronary Artery Vessel is large. The vessel exhibits minimal luminal irregularities. Ost RCA to Prox RCA lesion is 75% stenosed. The lesion is focal and irregular. Hazy/thrombotic appearing Prox RCA lesion is 65% stenosed. The lesion is located at the bend, focal and eccentric. Mid RCA to Dist RCA lesion is 95% stenosed. Vessel is the culprit lesion. The lesion is irregular and ulcerative. Dist RCA lesion is 50% stenosed with 25% stenosed side branch in Ost RPDA.  Acute Marginal Branch Vessel is small in size.  Inferior Septal Vessel is moderate  in size.  Right Posterior Atrioventricular Artery Vessel is moderate in size.  First Right Posterolateral Branch Vessel is moderate in size.  Second Right Posterolateral Branch Vessel is small in size.  Intervention  Ost RCA to Prox RCA lesion Stent Lesion length:  6 mm. Lesion crossed with guidewire. Pre-stent angioplasty was performed using a BALLOON EMERGE MR 2.5X12. Maximum pressure:  14 atm. Inflation time:  20 sec. A drug-eluting stent was successfully placed using a STENT RESOLUTE ONYX 3.0X26. Maximum pressure: 16 atm. Inflation time: 20 sec. Minimum lumen area:  3.5 mm. Stent strut is well apposed. Stent overlaps previously placed stent. Post-stent angioplasty was performed  using a BALLOON [place] EMERGE MR 3.5X12. Maximum pressure:  18 atm. Inflation time:  20 sec. Post-Intervention Lesion Assessment The intervention was successful. Pre-interventional TIMI flow is 3. Post-intervention TIMI flow is 3. Treated lesion length:  32 mm. No complications occurred at this lesion. There is a 0% residual stenosis post intervention.  Prox RCA lesion Stent Lesion length:  16 mm. Lesion crossed with guidewire using a WIRE ASAHI MIRACLEBROS12 300CM. Pre-stent angioplasty was performed using a BALLOON EMERGE MR 2.5X12. Maximum pressure:  14 atm. Inflation time:  20 sec. A drug-eluting stent was successfully placed using a STENT RESOLUTE ONYX 3.0X26. Maximum pressure: 18 atm. Inflation time: 30 sec. Minimum lumen area:  3.2 mm. Stent strut is well apposed. Post-stent angioplasty was performed using a BALLOON [place] EMERGE MR 3.5X12. Maximum pressure:  16 atm. Inflation time:  20 sec. Post-Intervention Lesion Assessment The intervention was successful. Pre-interventional TIMI flow is 3. Post-intervention TIMI flow is 3. Treated lesion length:  32 mm. No complications occurred at this lesion. There is a 0% residual stenosis post intervention.  Mid RCA to Dist RCA lesion Stent Lesion length:  14 mm. Lesion crossed with guidewire using a WIRE ASAHI MIRACLEBROS12 300CM. Pre-stent angioplasty was performed using a BALLOON EMERGE MR 2.5X12. Maximum pressure:  12 atm. Inflation time:  20 sec. BALLOON EMERGE MR PUSH 1.5X15 (16109) -used for initial inflation to help wire advancement A drug-eluting stent was successfully placed using a STENT RESOLUTE ONYX [id number]. Maximum pressure: 16 atm. Inflation time: 30 sec. Minimum lumen area:  2.9 mm. Stent strut is well apposed. Post-stent angioplasty was not performed. Stent balloon at high atmosphere The upstream vessel was very calcified and tortuous making it very difficult to cross the lesion with a wire.  It was then impossible to advance even balloons  over standard wires.  A miracle brother heavy support wire was then used to allow for passing of balloons and stents with the original wire being used as a buddy wire as well.  This is a difficult/complex lesion for intervention based on difficulty wiring, multiple wires and multiple upstream lesions. Post-Intervention Lesion Assessment The intervention was successful. Pre-interventional TIMI flow is 3. Post-intervention TIMI flow is 3. Treated lesion length:  16 mm. No complications occurred at this lesion. There is a 0% residual stenosis post intervention.   STRESS TESTS  NM MYOCAR MULTI W/SPECT W 11/22/2017  Narrative  Large defect in the inferior (base, mid, distal) and inferolateral (base, mid) that is partially reversible with improvement in the inferolateral region. LVEF is 51% with overall normal wall thickening. Changes noted above may reflect soft tissue attenuation (extensive GI uptake and diaphragm limits study), cannot exclude subendocardial scar and mild periinfart ischemia  LVEF 51% with normal wall thickening  Intermediate risk study. Extensive soft tissue activity limits study.   ECHOCARDIOGRAM  ECHOCARDIOGRAM COMPLETE  03/31/2021  Narrative ECHOCARDIOGRAM REPORT    Patient Name:   Noah Scott Date of Exam: 03/31/2021 Medical Rec #:  [id number]   Height:       70.0 in Accession #:    [id number]  Weight:       195.0 lb Date of Birth:  01/26/1936  BSA:          2.065 m Patient Age:    84 years    BP:           112/74 mmHg Patient Gender: M           HR:           72 bpm. Exam Location:  [place]  Procedure: 2D Echo, 3D Echo, Cardiac Doppler, Color Doppler and Strain Analysis  Indications:    R60.0 Edema R06.02 Shortness of Breath  History:        Patient has prior history of Echocardiogram examinations, most recent 11/23/2017. CAD and Previous Myocardial Infarction, Signs/Symptoms:Shortness of Breath and Edema; Risk Factors:Family History of Coronary Artery  Disease, Hypertension, Dyslipidemia and Former Smoker.  Sonographer:    Farrel Conners RDCS Referring Phys: [id number] KARDIE TOBB  IMPRESSIONS   1. Left ventricular ejection fraction, by estimation, is 55 to 60%. The left ventricle has normal function. The left ventricle has no regional wall motion abnormalities. Left ventricular diastolic parameters are consistent with Grade I diastolic dysfunction (impaired relaxation). The average left ventricular global longitudinal strain is 26.8 %. The global longitudinal strain is normal. 2. Right ventricular systolic function is normal. The right ventricular size is mildly enlarged. 3. The mitral valve is normal in structure. No evidence of mitral valve regurgitation. No evidence of mitral stenosis. 4. The aortic valve is normal in structure. Aortic valve regurgitation is not visualized. Mild aortic valve sclerosis is present, with no evidence of aortic valve stenosis. 5. There is moderate dilatation of the aortic root and of the ascending aorta, measuring 41 mm. 6. The inferior vena cava is normal in size with greater than 50% respiratory variability, suggesting right atrial pressure of 3 mmHg.  FINDINGS Left Ventricle: Left ventricular ejection fraction, by estimation, is 55 to 60%. The left ventricle has normal function. The left ventricle has no regional wall motion abnormalities. The average left ventricular global longitudinal strain is 26.8 %. The global longitudinal strain is normal. The left ventricular internal cavity size was normal in size. There is no left ventricular hypertrophy. Left ventricular diastolic parameters are consistent with Grade I diastolic dysfunction (impaired relaxation). Normal left ventricular filling pressure.  Right Ventricle: The right ventricular size is mildly enlarged. No increase in right ventricular wall thickness. Right ventricular systolic function is normal.  Left Atrium: Left atrial size was normal in  size.  Right Atrium: Right atrial size was normal in size.  Pericardium: There is no evidence of pericardial effusion.  Mitral Valve: The mitral valve is normal in structure. No evidence of mitral valve regurgitation. No evidence of mitral valve stenosis.  Tricuspid Valve: The tricuspid valve is normal in structure. Tricuspid valve regurgitation is not demonstrated. No evidence of tricuspid stenosis.  Aortic Valve: The aortic valve is normal in structure. Aortic valve regurgitation is not visualized. Mild aortic valve sclerosis is present, with no evidence of aortic valve stenosis.  Pulmonic Valve: The pulmonic valve was normal in structure. Pulmonic valve regurgitation is not visualized. No evidence of pulmonic stenosis.  Aorta: The aortic root is normal in size and structure and the aortic arch was  not well visualized. There is moderate dilatation of the aortic root and of the ascending aorta, measuring 41 mm.  Venous: A normal flow pattern is recorded from the right upper pulmonary vein. The inferior vena cava is normal in size with greater than 50% respiratory variability, suggesting right atrial pressure of 3 mmHg.  IAS/Shunts: No atrial level shunt detected by color flow Doppler.   LEFT VENTRICLE PLAX 2D LVIDd:         3.05 cm  Diastology LVIDs:         2.13 cm  LV e' medial:    8.59 cm/s LV PW:         0.94 cm  LV E/e' medial:  6.3 LV IVS:        0.96 cm  LV e' lateral:   12.20 cm/s LVOT diam:     2.10 cm  LV E/e' lateral: 4.4 LV SV:         75 LV SV Index:   36       2D Longitudinal Strain LVOT Area:     3.46 cm 2D Strain GLS (A2C):   25.8 % 2D Strain GLS (A3C):   31.2 % 2D Strain GLS (A4C):   23.5 % 2D Strain GLS Avg:     26.8 %  3D Volume EF: 3D EF:        53 % LV EDV:       150 ml LV ESV:       70 ml LV SV:        80 ml  RIGHT VENTRICLE RV S prime:     14.30 cm/s TAPSE (M-mode): 2.6 cm  LEFT ATRIUM             Index       RIGHT ATRIUM           Index LA diam:         2.90 cm 1.40 cm/m  RA Area:     19.00 cm LA Vol (A2C):   57.9 ml 28.04 ml/m RA Volume:   53.30 ml  25.81 ml/m LA Vol (A4C):   75.6 ml 36.61 ml/m LA Biplane Vol: 68.5 ml 33.17 ml/m AORTIC VALVE LVOT Vmax:   98.10 cm/s LVOT Vmean:  62.950 cm/s LVOT VTI:    0.218 m  AORTA Ao Root diam: 3.90 cm Ao Asc diam:  4.10 cm  MITRAL VALVE MV Area (PHT): cm         SHUNTS MV Decel Time: 349 msec    Systemic VTI:  0.22 m MV E velocity: 54.20 cm/s  Systemic Diam: 2.10 cm MV A velocity: 71.10 cm/s MV E/A ratio:  0.76  Norman Herrlich MD Electronically signed by Norman Herrlich MD Signature Date/Time: 03/31/2021/5:03:23 PM    Final    MONITORS  LONG TERM MONITOR (3-14 DAYS) 10/05/2020  Narrative  Predominantly sinus rhythm: Minimum heart rate 47 bpm, maximum 118 bpm with an average of 64 bpm.  There were 12 brief episodes of SVT (supraventricular tachycardia): Fastest was 10 beats-144 bpm, longest was 20 beats with a rate of 92 bpm.  Rare (<1% )isolated PACs with rare couplets and triplets.  Rare isolated PVCs (<1%)  No true prolonged arrhythmias besides short bursts of SVT. No atrial fibrillation or flutter or prolonged SVT.  No significant bradycardia episodes.  SVT episodes were not noted on patient diary   Patch Wear Time:  3 days and 13 hours (2022-01-08T19:35:07-499 to 2022-01-12T08:53:15-0500)   Bryan Lemma, MD  Recent Labs: 03/05/2023: ALT 17; BUN 22; Creatinine 1.05; Hemoglobin 12.4; Platelet Count 204; Potassium 4.5; Sodium 140  Recent Lipid Panel    Component Value Date/Time   CHOL 112 11/13/2022 1357   CHOL 119 11/21/2021 0901   TRIG 62.0 11/13/2022 1357   HDL 41.30 11/13/2022 1357   HDL 47 11/21/2021 0901   CHOLHDL 3 11/13/2022 1357   VLDL 12.4 11/13/2022 1357   LDLCALC 58 11/13/2022 1357   LDLCALC 59 11/21/2021 0901    History of Present Illness    87 year old male with the above past medical history including CAD s/p prior PCI-OM1,  s/p DES x 2 overlapping-o-pRCA and DES-m-dRCA in 2019, dilation of ascending aorta, hypertension, hyperlipidemia, palpitations, lower extremity edema the setting of venous insufficiency, iron deficiency anemia, and obesity.  He was hospitalized in March 2019 in the setting of NSTEMI.  He underwent cardiac catheterization with DES x 2 overlapping-ostial to proximal RCA, DES-mid to distal RCA.  He has a history of "allergic reaction" to Plavix and Brilinta.  He has been maintained on Effient. He has a history of chronic bilateral lower extremity edema in the setting of venous reflux.  Most recent echocardiogram in 03/2021 showed EF 55 to 60%, normal LV function, no RWMA, G1 DD, normal RV, no significant valvular abnormalities, mild dilation of the aortic root and ascending aorta, measuring 41 mm. Cardiac monitor in 2022 in the setting of palpitations revealed predominantly sinus rhythm, brief episodes of SVT, rare PACs and PVCs. He was last seen in the office on 11/13/2021 and was stable from a cardiac standpoint.  He denied symptoms concerning for angina.  He presents today for follow-up.  Since his last visit he has done well from a cardiac standpoint.  He denies any symptoms concerning for angina.  He remains active.  He notes that when he last spoke with Dr. Herbie Baltimore he had mentioned possibly transitioning from Effient to aspirin.  He is once again asking about this possibility. Overall, he reports feeling well.    Home Medications    Current Outpatient Medications  Medication Sig Dispense Refill   acetaminophen (TYLENOL) 650 MG CR tablet Take 650 mg by mouth every 8 (eight) hours as needed for pain.     Aromatic Inhalants (VICKS VAPOR INHALER IN) Inhale 1 Inhaler into the lungs as needed (stuffy nose).     Cholecalciferol (D3 EXTRA STRENGTH) 125 MCG (5000 UT) CAPS Take 1 capsule by mouth daily.     cyanocobalamin (VITAMIN B12) 1000 MCG tablet Take 1,000 mcg by mouth daily.     docusate sodium (COLACE)  100 MG capsule Take 100 mg by mouth 2 (two) times daily.     doxazosin (CARDURA) 8 MG tablet Take 8 mg by mouth daily.     furosemide (LASIX) 20 MG tablet TAKE 2 TABLET EVERY OTHER DAY AND 1 TABLET ON ODD DAYS. MAY TAKE AN ADDITIONAL TABLET FOR WEIGHT GAIN OF 3LBS OVERNIGHT OR INCREASED SWELLING 200 tablet 3   losartan (COZAAR) 50 MG tablet Take 1 tablet (50 mg total) by mouth daily. 30 tablet 0   magnesium oxide (MAG-OX) 400 (240 Mg) MG tablet Take 400 mg by mouth daily.     metoprolol tartrate (LOPRESSOR) 25 MG tablet Take 0.5 tablets (12.5 mg total) by mouth 2 (two) times daily. 30 tablet 3   nitroGLYCERIN (NITROSTAT) 0.6 MG SL tablet Place 0.6 mg under the tongue as needed.     prasugrel (EFFIENT) 5 MG TABS tablet Take 1 tablet (5 mg  total) by mouth daily. 90 tablet 0   rosuvastatin (CRESTOR) 20 MG tablet Take 1 tablet (20 mg total) by mouth daily. 90 tablet 3   traMADol (ULTRAM) 50 MG tablet Take 1 tablet (50 mg total) by mouth 3 (three) times daily as needed. 1 tab po every 8 hours prn severe pain. 90 tablet 0   [START ON 08/05/2023] traMADol (ULTRAM) 50 MG tablet Take 1 tablet (50 mg total) by mouth 3 (three) times daily as needed. 1 tab po every 8 hours prn severe pain. 90 tablet 0   No current facility-administered medications for this visit.     Review of Systems    He denies chest pain, palpitations, dyspnea, pnd, orthopnea, n, v, dizziness, syncope, edema, weight gain, or early satiety. All other systems reviewed and are otherwise negative except as noted above.   Physical Exam    VS:  BP 126/64   Pulse 62   Ht 5\' 10"  (1.778 m)   Wt 207 lb (93.9 kg)   SpO2 97%   BMI 29.70 kg/m   GEN: Well nourished, well developed, in no acute distress. HEENT: normal. Neck: Supple, no JVD, carotid bruits, or masses. Cardiac: RRR, no murmurs, rubs, or gallops. No clubbing, cyanosis, edema.  Radials/DP/PT 2+ and equal bilaterally.  Respiratory:  Respirations regular and unlabored, clear to  auscultation bilaterally. GI: Soft, nontender, nondistended, BS + x 4. MS: no deformity or atrophy. Skin: warm and dry, no rash. Neuro:  Strength and sensation are intact. Psych: Normal affect.  Accessory Clinical Findings    ECG personally reviewed by me today - EKG Interpretation Date/Time:  Wednesday June 19 2023 15:02:30 EDT Ventricular Rate:  62 PR Interval:  222 QRS Duration:  104 QT Interval:  422 QTC Calculation: 428 R Axis:   -11  Text Interpretation: Sinus rhythm with 1st degree A-V block with Premature atrial complexes When compared with ECG of 26-Nov-2017 06:39, Premature atrial complexes are now Present Criteria for Anteroseptal infarct are no longer Present Confirmed by Bernadene Person ([id number]) on 06/19/2023 3:24:04 PM  - no acute changes.   Lab Results  Component Value Date   WBC 6.3 03/05/2023   HGB 12.4 (L) 03/05/2023   HCT 38.7 (L) 03/05/2023   MCV 94.6 03/05/2023   PLT 204 03/05/2023   Lab Results  Component Value Date   CREATININE 1.05 03/05/2023   BUN 22 03/05/2023   NA 140 03/05/2023   K 4.5 03/05/2023   CL 103 03/05/2023   CO2 28 03/05/2023   Lab Results  Component Value Date   ALT 17 03/05/2023   AST 21 03/05/2023   ALKPHOS 67 03/05/2023   BILITOT 0.4 03/05/2023   Lab Results  Component Value Date   CHOL 112 11/13/2022   HDL 41.30 11/13/2022   LDLCALC 58 11/13/2022   TRIG 62.0 11/13/2022   CHOLHDL 3 11/13/2022    Lab Results  Component Value Date   HGBA1C 6.2 11/13/2022    Assessment & Plan    1. CAD: S/p prior PCI-OM1, s/p DES x 2 overlapping-o-pRCA and DES-m-dRCA in 2019. Stable with no anginal symptoms. No indication for ischemic evaluation. He is asking if he needs to continue Effient, or if he may transition to aspirin.  Will discuss with Dr. Herbie Baltimore.  For now, continue Effient, metoprolol, losartan, and Crestor.  2. Dilation of ascending aorta: Most recent echocardiogram in 03/2021 showed EF 55 to 60%, normal LV function, no  RWMA, G1 DD, normal RV, no significant valvular abnormalities,  mild dilation of the aortic root and ascending aorta, measuring 41 mm.  Will update CT chest/aorta.  Will check BMET today in preparation.  3. Hypertension: BP well controlled. Continue current antihypertensive regimen.   4. Hyperlipidemia: LDL was 58 in 10/2022.  Continue Crestor.  5. Palpitations: Cardiac monitor in 2022 revealed predominantly sinus rhythm, brief episodes of SVT, rare PACs and PVCs.  He denies any recent palpitations.  Continue metoprolol.  6. Lower extremity edema:  Most recent echo as above. Euvolemic and well compensated on exam. Continue Lasix.  7. Disposition: Follow-up in 6 months with Dr. Herbie Baltimore.        Joylene Grapes, NP 06/19/2023, 4:45 PM

## 2023-06-20 ENCOUNTER — Ambulatory Visit: Payer: Medicare Other | Attending: Physical Medicine and Rehabilitation | Admitting: Physical Therapy

## 2023-06-20 DIAGNOSIS — R2689 Other abnormalities of gait and mobility: Secondary | ICD-10-CM | POA: Diagnosis present

## 2023-06-20 DIAGNOSIS — M5459 Other low back pain: Secondary | ICD-10-CM | POA: Insufficient documentation

## 2023-06-20 DIAGNOSIS — M6281 Muscle weakness (generalized): Secondary | ICD-10-CM | POA: Insufficient documentation

## 2023-06-20 DIAGNOSIS — M79604 Pain in right leg: Secondary | ICD-10-CM | POA: Insufficient documentation

## 2023-06-20 DIAGNOSIS — M79605 Pain in left leg: Secondary | ICD-10-CM | POA: Insufficient documentation

## 2023-06-20 LAB — BASIC METABOLIC PANEL
BUN/Creatinine Ratio: 17 (ref 10–24)
BUN: 16 mg/dL (ref 8–27)
CO2: 26 mmol/L (ref 20–29)
Calcium: 8.9 mg/dL (ref 8.6–10.2)
Chloride: 100 mmol/L (ref 96–106)
Creatinine, Ser: 0.93 mg/dL (ref 0.76–1.27)
Glucose: 99 mg/dL (ref 70–99)
Potassium: 4.3 mmol/L (ref 3.5–5.2)
Sodium: 142 mmol/L (ref 134–144)
eGFR: 81 mL/min/{1.73_m2} (ref >59)

## 2023-06-20 NOTE — Therapy (Signed)
OUTPATIENT PHYSICAL THERAPY THORACOLUMBAR TREATMENT   Patient Name: Brandley Raines MRN: [id number] DOB:[birth year], 87 y.o., male Today's Date: 06/20/2023  END OF SESSION:  PT End of Session - 06/20/23 1403     Visit Number 3    Number of Visits 5   with eval   Date for PT Re-Evaluation 07/12/23    Authorization Type Medicare    PT Start Time 1400    PT Stop Time 1454    PT Time Calculation (min) 54 min    Activity Tolerance Patient tolerated treatment well    Behavior During Therapy [facility] for tasks assessed/performed               Past Medical History:  Diagnosis Date   CAD S/P percutaneous coronary angioplasty 2001   a) 2001 ([place]): PCI OM1 - now with ~65% ISR.;; b) NSTEMI 11/2017 - ost-prox RCA 75-65% (Overlapping ONYX DES 3.0 x 26 & 3.0 x 8 --> 3.5-3.3 mm). m-dRCA 95% (ONYX DES 2.75 x 18 mm -> 3.0 mm). Ost OM1 65% ISR (med Rx). dRCA-OstRDA (50%-25%)   Essential (primary) hypertension 11/21/2017   Hyperlipidemia 11/21/2017   Iron deficiency anemia due to chronic blood loss 08/08/2021   Iron malabsorption 08/08/2021   Neurogenic claudication due to lumbar spinal stenosis 02/06/2018   NSTEMI (non-ST elevated myocardial infarction) (HCC) 11/21/2017   Multiple RCA lesions - 2 Overlapping DES Ost-Prox RCA & 1 in mid-distal RCA   Spinal stenosis of lumbar region    Venous reflux 11/2020   Venous Dopplers 12/15/2020: No DVT or superficial venous thrombosis bilaterally.  Venous reflux noted in bilateral FV and saphenofemoral junctions, R GSV in the thigh and R SFV, left proximal thigh GSV. -->  No invasive options due to deep and superficial venous reflux.   Past Surgical History:  Procedure Laterality Date   CORONARY STENT INTERVENTION N/A 11/25/2017   Procedure: CORONARY STENT INTERVENTION;  Surgeon: Marykay Lex, MD;  Location: [facility] INVASIVE CV LAB;  Service: Cardiovascular:   m-dRCA 95%  (DES PCI - 0%.  Resolute ONYX DES 2.75 x 18 mm - ~3.0 mm). Ost-proxRCA 75-65% (DES  PCI-0%. 2 overlapping Resolute ONYX DES 3.0 x 26 & 3.0 x 8 mm --> 3.5-3.3 mm)   CORONARY STENT INTERVENTION  2001   ([place]): PCI OM1   LEA DOPPLERS  12/12/2017   Normal bilateral ABIs and TBI's.   LEFT HEART CATH AND CORONARY ANGIOGRAPHY N/A 11/25/2017   Procedure: LEFT HEART CATH AND CORONARY ANGIOGRAPHY;  Surgeon: Marykay Lex, MD;  Location: [facility] INVASIVE CV LAB;  Service: Cardiovascular:  Ost-proxRCA 75&65%(DES PCI), m-dRCA 95% (DES PCI), dRCA 50%-25% ost rPDA. ostOM1 65% ISR (from 2001).    LUMBAR LAMINECTOMY/DECOMPRESSION MICRODISCECTOMY Bilateral 06/13/2018   Procedure: Laminectomy and Foraminotomy - L1-L2 - L2-L3 - L3-L4 - bilateral;  Surgeon: Tia Alert, MD;  Location: [facility] OR;  Service: Neurosurgery;  Laterality: Bilateral;   TRANSTHORACIC ECHOCARDIOGRAM  11/23/2017   a) 11/2017: Nl LV size & Fxn. EF 60 - 65%.  No RWMA.  GR 1 DD.  Mild RV dilation.; b) 05/2020:([facility]): Normal LV size and function.  EF 60 and 65%.  Normal RV size and function.  Aortic sclerosis with no stenosis.  No AI.  Mildly dilated ascending aorta.  CVP ~5 mm.   TRANSTHORACIC ECHOCARDIOGRAM  03/31/2021   EF 55 to 60%.  GR 1 DD.  Ascending aorta measured at 41 mm-moderate dilation.  Recommended follow-up with imaging study in 6 months to reassess.  Patient Active Problem List   Diagnosis Date Noted   Chronic pain of both knees 05/13/2023   Chronic pain syndrome 05/13/2023   Iron deficiency anemia due to chronic blood loss 08/08/2021   Iron malabsorption 08/08/2021   Dilatation of thoracic aorta (HCC) 05/23/2021   SOB (shortness of breath) 02/20/2021   Bilateral leg edema 02/20/2021   Coronary artery disease involving native coronary artery of native heart 02/20/2021   Leg pain, bilateral 09/20/2020   Fluttering heart 09/20/2020   Episodic lightheadedness 09/20/2020   Dyspnea 11/26/2018   S/P lumbar laminectomy 06/13/2018   Preoperative clearance 03/15/2018   Metabolic syndrome 03/15/2018   Obesity  (BMI 30.0-34.9) 03/15/2018   Neurogenic claudication due to lumbar spinal stenosis 02/06/2018   NSTEMI (non-ST elevated myocardial infarction) (HCC) 11/21/2017   Essential (primary) hypertension 11/21/2017   Hyperlipidemia with target LDL less than 70 11/21/2017   CAD S/P percutaneous coronary angioplasty 09/18/1999    PCP: Marisue Brooklyn  REFERRING PROVIDER: Angelina Sheriff, DO  REFERRING DIAG: M79.604,M79.605 (ICD-10-CM) - Leg pain, bilateral Z98.890 (ICD-10-CM) - S/P lumbar laminectomy M48.062 (ICD-10-CM) - Neurogenic claudication due to lumbar spinal stenosis  Rationale for Evaluation and Treatment: Rehabilitation  THERAPY DIAG:  Muscle weakness (generalized)  Other abnormalities of gait and mobility  Other low back pain  Pain in left leg  Pain in right leg  ONSET DATE: 05/13/2023 (referral date)  SUBJECTIVE:                                                                                                                                                                                           SUBJECTIVE STATEMENT: Pt reports flare-up of pain in R mid-lower back region a few days ago, 8/10 today. Pt reports he feels like exercises done last session aggravated his pain, back feels "tight".   PERTINENT HISTORY:  past medical history of CAD S/P percutaneous coronary angioplasty, HTN, HLD, Iron deficiency anemia due to chronic blood loss, Iron malabsorption, Neurogenic claudication due to lumbar spinal stenosis, NSTEMI, Spinal stenosis of lumbar region, and Venous reflux  PAIN: "pretty good today" Are you having pain? Yes: NPRS scale: 8/10 Pain location: low back and down into legs Pain description: burning Aggravating factors: laying down flat Relieving factors: sitting down for a short period of time, tramadol brings it down a bit, taking pill for edema helps    PRECAUTIONS: None  RED FLAGS: None   WEIGHT BEARING RESTRICTIONS: No  FALLS:  Has patient  fallen in last 6 months? No  LIVING ENVIRONMENT: Lives with: lives alone Lives in: House/apartment Stairs: Yes: Internal: 12+ steps; can reach both Has following  equipment at home: Single point cane  OCCUPATION: retired Personnel officer  PLOF: Independent with gait and Independent with transfers  PATIENT GOALS: "I don't know"  NEXT MD VISIT: Dr. Shearon Stalls 08/14/2023  OBJECTIVE:   DIAGNOSTIC FINDINGS:  Lumbar Spine Xray 05/29/2023  AP lateral radiographs lumbar spine reviewed.  No hardware present.   Severe degenerative disc disease is present throughout the lumbar spine  with no acute compression fractures or spondylolisthesis.  PATIENT SURVEYS:  Modified Oswestry 28/50, severe disability   SCREENING FOR RED FLAGS: Bowel or bladder incontinence: No Spinal tumors: No Cauda equina syndrome: No Compression fracture: No Abdominal aneurysm: No  COGNITION: Overall cognitive status: Within functional limits for tasks assessed     SENSATION: WFL  POSTURE: forward head   LUMBAR ROM:   AROM eval  Flexion WFL "tightness"  Extension WFL  Right lateral flexion 75%, pain across low back  Left lateral flexion 75%, pain across low back  Right rotation WFL, "stiff"  Left rotation WFL, "stiff"   (Blank rows = not tested)  LOWER EXTREMITY ROM:     Active  Right eval Left eval  Hip flexion [facility]  Hip extension    Hip abduction    Hip adduction    Hip internal rotation    Hip external rotation    Knee flexion [facility] WFL  Knee extension Tight HS Tight HS  Ankle dorsiflexion Tight heel cords Tight heel cords  Ankle plantarflexion    Ankle inversion    Ankle eversion     (Blank rows = not tested)  LOWER EXTREMITY MMT:    MMT Right eval Left eval  Hip flexion 5 5  Hip extension    Hip abduction    Hip adduction    Hip internal rotation    Hip external rotation    Knee flexion 5 5  Knee extension 5 5  Ankle dorsiflexion 5 5  Ankle plantarflexion    Ankle inversion     Ankle eversion     (Blank rows = not tested)  LUMBAR SPECIAL TESTS:  Slump test: Positive "Tightness" in legs, feels like the tightness and stiffness in his legs that limits him from standing still for too long  GAIT: Distance walked: various clinic distances Assistive device utilized: Single point cane Level of assistance: Modified independence Comments: mild antalgia  TODAY'S TREATMENT:                                                                                                                                Therapeutic Exercise To work on stretching to address onset of R-sided low back pain this date: Supine SKTC x 3 reps with RLE Supine sciatic nerve glide x 3 reps with RLE Pt already doing these exercises at home Seated anterior leans on therapy ball + lateral lean (minimal stretch detected) Marjo Bicker pose (difficulty sitting back on heels), + thread the needle 3 x 30 sec on R side Pt feels  good stretch with thread the needle  To work on core strengthening and stabilization: Supine posterior pelvic tilt x 10 reps with max verbal and tactile cues for correct exercise performance +Bridges x 10 reps +Marches x 10 reps  Added to HEP, see bolded below   TherAct Trigger Point Dry-Needling  Treatment instructions: Expect mild to moderate muscle soreness. S/S of pneumothorax if dry needled over a lung field, and to seek immediate medical attention should they occur. Patient verbalized understanding of these instructions and education.  Patient Consent Given: Yes Education handout provided: Yes Muscles treated: R lumbar iliocostalis Treatment response/outcome: deep ache/cramp; muscle twitch detected   Trigger Point Dry Needling  What is Trigger Point Dry Needling (DN)? DN is a physical therapy technique used to treat muscle pain and dysfunction. Specifically, DN helps deactivate muscle trigger points (muscle knots).  A thin filiform needle is used to penetrate the skin  and stimulate the underlying trigger point. The goal is for a local twitch response (LTR) to occur and for the trigger point to relax. No medication of any kind is injected during the procedure.   What Does Trigger Point Dry Needling Feel Like?  The procedure feels different for each individual patient. Some patients report that they do not actually feel the needle enter the skin and overall the process is not painful. Very mild bleeding may occur. However, many patients feel a deep cramping in the muscle in which the needle was inserted. This is the local twitch response.   How Will I feel after the treatment? Soreness is normal, and the onset of soreness may not occur for a few hours. Typically this soreness does not last longer than two days.  Bruising is uncommon, however; ice can be used to decrease any possible bruising.  In rare cases feeling tired or nauseous after the treatment is normal. In addition, your symptoms may get worse before they get better, this period will typically not last longer than 24 hours.   What Can I do After My Treatment? Increase your hydration by drinking more water for the next 24 hours. You may place ice or heat on the areas treated that have become sore, however, do not use heat on inflamed or bruised areas. Heat often brings more relief post needling. You can continue your regular activities, but vigorous activity is not recommended initially after the treatment for 24 hours. DN is best combined with other physical therapy such as strengthening, stretching, and other therapies.     PATIENT EDUCATION:  Education details: continue HEP, added to HEP, TPDN (see above) Person educated: Patient Education method: Explanation, Demonstration, Tactile cues, Verbal cues, and Handouts Education comprehension: verbalized understanding, returned demonstration, and needs further education  HOME EXERCISE PROGRAM: Access Code: [email or web address] URL:  [email or web address] Date: 06/10/2023 Prepared by: Peter Congo  Exercises - Seated Hamstring Stretch  - 1 x daily - 7 x weekly - 1 sets - 3-5 reps - 30 sec hold - Gastroc Stretch on Wall  - 1 x daily - 7 x weekly - 1 sets - 3-5 reps - Supine Lower Trunk Rotation  - 1 x daily - 7 x weekly - 1 sets - 10 reps - 5 sec hold - Child's Pose Stretch  - 1 x daily - 7 x weekly - 1 sets - 5-10 reps - 30 sec hold - Seated Piriformis Stretch with Trunk Bend  - 1 x daily - 7 x weekly - 1 sets - 3-5 reps - 30 sec hold -  Supine Posterior Pelvic Tilt  - 1 x daily - 7 x weekly - 1 sets - 10 reps - 5 sec hold - Supine Bridge  - 1 x daily - 7 x weekly - 3 sets - 10 reps - 5 sec hold - Supine March with Posterior Pelvic Tilt  - 1 x daily - 7 x weekly - 3 sets - 10 reps  ASSESSMENT:  CLINICAL IMPRESSION: Emphasis of skilled PT session addressing flare-up of R sided back pain with stretching and TPDN as well as adding core stabilization exercises to HEP. Pt with stretch felt in R-sided lumbar region with thread the needle stretch and with good relief of symptoms with TPDN, can further assess response to DN next session. Pt initially with max cues needed to perform posterior pelvic tilt correctly, improves as session progresses. Pt continues to benefit from skilled therapy services to ensure compliance and understanding of HEP as well as to assess progress towards LTGs. Continue HEP.   OBJECTIVE IMPAIRMENTS: Abnormal gait, decreased activity tolerance, difficulty walking, decreased ROM, impaired perceived functional ability, impaired flexibility, improper body mechanics, and pain.   ACTIVITY LIMITATIONS: carrying, lifting, bending, sitting, standing, and bed mobility  PARTICIPATION LIMITATIONS: community activity  PERSONAL FACTORS: Age, Past/current experiences, Time since onset of injury/illness/exacerbation, and 3+ comorbidities:    past medical history of CAD S/P percutaneous coronary  angioplasty, HTN, HLD, Iron deficiency anemia due to chronic blood loss, Iron malabsorption, Neurogenic claudication due to lumbar spinal stenosis, NSTEMI, Spinal stenosis of lumbar region, and Venous refluxare also affecting patient's functional outcome.   REHAB POTENTIAL: Fair time since onset/chronicity of pain; has tried PT previously, buy-in  CLINICAL DECISION MAKING: Stable/uncomplicated  EVALUATION COMPLEXITY: Low   GOALS: Goals reviewed with patient? Yes  SHORT TERM GOALS=LONG TERM GOALS due to length of POC   LONG TERM GOALS: Target date: 06/29/2023  Pt will be independent with final HEP for improved strength, transfers, gait and for increased independence with management of pain symptoms. Baseline: not established at eval Goal status: INITIAL  2.  Pt will improve score on the Oswestry to 23/50 to demonstrate decreased disability level Baseline: 28/50, severe disability (9/13) Goal status: INITIAL  3.  Pt will report </= 6/10 pain with ADLs at most to demonstrate improved function Baseline: 8/10 (9/13) Goal status: INITIAL   PLAN:  PT FREQUENCY: 1x/week  PT DURATION:  4 sessions if needed  PLANNED INTERVENTIONS: Therapeutic exercises, Therapeutic activity, Neuromuscular re-education, Balance training, Gait training, Patient/Family education, Self Care, Joint mobilization, DME instructions, Aquatic Therapy, Dry Needling, Electrical stimulation, Spinal manipulation, Spinal mobilization, Cryotherapy, Moist heat, Taping, Manual therapy, and Re-evaluation.  PLAN FOR NEXT SESSION: how was response to DN?, review HEP, assess LTG, d/c?   Peter Congo, PT, DPT, CSRS  06/20/2023, 2:55 PM

## 2023-06-25 ENCOUNTER — Inpatient Hospital Stay
Admission: RE | Admit: 2023-06-25 | Discharge: 2023-06-25 | Discharge status: Home / Self Care | Payer: Medicare Other | Source: Ambulatory Visit | Attending: Nurse Practitioner | Admitting: Nurse Practitioner

## 2023-06-25 ENCOUNTER — Encounter: Payer: Self-pay | Admitting: Hematology & Oncology

## 2023-06-25 DIAGNOSIS — I77810 Thoracic aortic ectasia: Secondary | ICD-10-CM

## 2023-06-25 MED ORDER — IOPAMIDOL (ISOVUE-370) INJECTION 76%
500.0000 mL | Freq: Once | INTRAVENOUS | Status: AC | PRN
  Administered 2023-06-25: 75 mL via INTRAVENOUS

## 2023-06-26 ENCOUNTER — Other Ambulatory Visit: Payer: Self-pay | Admitting: Cardiology

## 2023-06-26 DIAGNOSIS — I10 Essential (primary) hypertension: Secondary | ICD-10-CM

## 2023-06-26 DIAGNOSIS — I25118 Atherosclerotic heart disease of native coronary artery with other forms of angina pectoris: Secondary | ICD-10-CM

## 2023-07-02 ENCOUNTER — Telehealth: Payer: Self-pay

## 2023-07-02 ENCOUNTER — Telehealth: Payer: Self-pay | Admitting: Cardiology

## 2023-07-02 NOTE — Telephone Encounter (Signed)
*  STAT* If patient is at the pharmacy, call can be transferred to refill team.   1. Which medications need to be refilled? (please list name of each medication and dose if known) losartan (COZAAR) 50 MG tablet    2. Which pharmacy/location (including street and city if local pharmacy) is medication to be sent to? [id number] - [place], [place] - [address]   3. Do they need a 30 day or 90 day supply?   90 day supply

## 2023-07-02 NOTE — Telephone Encounter (Signed)
Patient requesting refill on tramadol

## 2023-07-03 ENCOUNTER — Encounter: Payer: Self-pay | Admitting: Hematology & Oncology

## 2023-07-03 ENCOUNTER — Ambulatory Visit: Payer: Medicare Other | Admitting: Physical Therapy

## 2023-07-03 DIAGNOSIS — M79604 Pain in right leg: Secondary | ICD-10-CM

## 2023-07-03 DIAGNOSIS — R2689 Other abnormalities of gait and mobility: Secondary | ICD-10-CM

## 2023-07-03 DIAGNOSIS — M5459 Other low back pain: Secondary | ICD-10-CM

## 2023-07-03 DIAGNOSIS — M79605 Pain in left leg: Secondary | ICD-10-CM

## 2023-07-03 DIAGNOSIS — M6281 Muscle weakness (generalized): Secondary | ICD-10-CM | POA: Diagnosis not present

## 2023-07-03 NOTE — Therapy (Signed)
OUTPATIENT PHYSICAL THERAPY THORACOLUMBAR TREATMENT- DISCHARGE NOTE   Patient Name: Noah Scott MRN: [id number] DOB:07-22-1936, 87 y.o., male Today's Date: 07/03/2023  PHYSICAL THERAPY DISCHARGE SUMMARY  Visits from Start of Care: 4  Current functional level related to goals / functional outcomes: Mod I   Remaining deficits: Ongoing chronic pain limiting tolerance for activity   Education / Equipment: Handout for HEP and Silver Sneakers locations   Patient agrees to discharge. Patient goals were partially met. Patient is being discharged due to being pleased with the current functional level.   END OF SESSION:  PT End of Session - 07/03/23 1532     Visit Number 4    Number of Visits 5   with eval   Date for PT Re-Evaluation 07/12/23    Authorization Type Medicare    PT Start Time 1532    PT Stop Time 1615    PT Time Calculation (min) 43 min    Activity Tolerance Patient tolerated treatment well    Behavior During Therapy WFL for tasks assessed/performed                Past Medical History:  Diagnosis Date   CAD S/P percutaneous coronary angioplasty 2001   a) 2001 ([place]): PCI OM1 - now with ~65% ISR.;; b) NSTEMI 11/2017 - ost-prox RCA 75-65% (Overlapping ONYX DES 3.0 x 26 & 3.0 x 8 --> 3.5-3.3 mm). m-dRCA 95% (ONYX DES 2.75 x 18 mm -> 3.0 mm). Ost OM1 65% ISR (med Rx). dRCA-OstRDA (50%-25%)   Essential (primary) hypertension 11/21/2017   Hyperlipidemia 11/21/2017   Iron deficiency anemia due to chronic blood loss 08/08/2021   Iron malabsorption 08/08/2021   Neurogenic claudication due to lumbar spinal stenosis 02/06/2018   NSTEMI (non-ST elevated myocardial infarction) (HCC) 11/21/2017   Multiple RCA lesions - 2 Overlapping DES Ost-Prox RCA & 1 in mid-distal RCA   Spinal stenosis of lumbar region    Venous reflux 11/2020   Venous Dopplers 12/15/2020: No DVT or superficial venous thrombosis bilaterally.  Venous reflux noted in bilateral FV and  saphenofemoral junctions, R GSV in the thigh and R SFV, left proximal thigh GSV. -->  No invasive options due to deep and superficial venous reflux.   Past Surgical History:  Procedure Laterality Date   CORONARY STENT INTERVENTION N/A 11/25/2017   Procedure: CORONARY STENT INTERVENTION;  Surgeon: Marykay Lex, MD;  Location: [facility] INVASIVE CV LAB;  Service: Cardiovascular:   m-dRCA 95%  (DES PCI - 0%.  Resolute ONYX DES 2.75 x 18 mm - ~3.0 mm). Ost-proxRCA 75-65% (DES PCI-0%. 2 overlapping Resolute ONYX DES 3.0 x 26 & 3.0 x 8 mm --> 3.5-3.3 mm)   CORONARY STENT INTERVENTION  2001   ([place]): PCI OM1   LEA DOPPLERS  12/12/2017   Normal bilateral ABIs and TBI's.   LEFT HEART CATH AND CORONARY ANGIOGRAPHY N/A 11/25/2017   Procedure: LEFT HEART CATH AND CORONARY ANGIOGRAPHY;  Surgeon: Marykay Lex, MD;  Location: [facility] INVASIVE CV LAB;  Service: Cardiovascular:  Ost-proxRCA 75&65%(DES PCI), m-dRCA 95% (DES PCI), dRCA 50%-25% ost rPDA. ostOM1 65% ISR (from 2001).    LUMBAR LAMINECTOMY/DECOMPRESSION MICRODISCECTOMY Bilateral 06/13/2018   Procedure: Laminectomy and Foraminotomy - L1-L2 - L2-L3 - L3-L4 - bilateral;  Surgeon: Tia Alert, MD;  Location: [facility] OR;  Service: Neurosurgery;  Laterality: Bilateral;   TRANSTHORACIC ECHOCARDIOGRAM  11/23/2017   a) 11/2017: Nl LV size & Fxn. EF 60 - 65%.  No RWMA.  GR 1 DD.  Mild RV dilation.; b) 05/2020:([facility]): Normal LV size and function.  EF 60 and 65%.  Normal RV size and function.  Aortic sclerosis with no stenosis.  No AI.  Mildly dilated ascending aorta.  CVP ~5 mm.   TRANSTHORACIC ECHOCARDIOGRAM  03/31/2021   EF 55 to 60%.  GR 1 DD.  Ascending aorta measured at 41 mm-moderate dilation.  Recommended follow-up with imaging study in 6 months to reassess.   Patient Active Problem List   Diagnosis Date Noted   Chronic pain of both knees 05/13/2023   Chronic pain syndrome 05/13/2023   Iron deficiency anemia due to chronic blood loss 08/08/2021   Iron  malabsorption 08/08/2021   Dilatation of thoracic aorta (HCC) 05/23/2021   SOB (shortness of breath) 02/20/2021   Bilateral leg edema 02/20/2021   Coronary artery disease involving native coronary artery of native heart 02/20/2021   Leg pain, bilateral 09/20/2020   Fluttering heart 09/20/2020   Episodic lightheadedness 09/20/2020   Dyspnea 11/26/2018   S/P lumbar laminectomy 06/13/2018   Preoperative clearance 03/15/2018   Metabolic syndrome 03/15/2018   Obesity (BMI 30.0-34.9) 03/15/2018   Neurogenic claudication due to lumbar spinal stenosis 02/06/2018   NSTEMI (non-ST elevated myocardial infarction) (HCC) 11/21/2017   Essential (primary) hypertension 11/21/2017   Hyperlipidemia with target LDL less than 70 11/21/2017   CAD S/P percutaneous coronary angioplasty 09/18/1999    PCP: Marisue Brooklyn  REFERRING PROVIDER: Angelina Sheriff, DO  REFERRING DIAG: M79.604,M79.605 (ICD-10-CM) - Leg pain, bilateral Z98.890 (ICD-10-CM) - S/P lumbar laminectomy M48.062 (ICD-10-CM) - Neurogenic claudication due to lumbar spinal stenosis  Rationale for Evaluation and Treatment: Rehabilitation  THERAPY DIAG:  Muscle weakness (generalized)  Other abnormalities of gait and mobility  Other low back pain  Pain in left leg  Pain in right leg  ONSET DATE: 05/13/2023 (referral date)  SUBJECTIVE:                                                                                                                                                                                           SUBJECTIVE STATEMENT:  Denies falls or acute changes. Pt not clear if dry needling was helpful or not, felt that the muscle needled became very tight after being needled. Sounds like he would like traction, which he had previously at chiropractor. Didn't do exercises because of pain. Reports that he can't stand long when starts walking as the pain increases.   PERTINENT HISTORY:  past medical history of CAD S/P  percutaneous coronary angioplasty, HTN, HLD, Iron deficiency anemia due to chronic blood loss, Iron malabsorption, Neurogenic claudication due to lumbar spinal stenosis, NSTEMI, Spinal  stenosis of lumbar region, and Venous reflux  PAIN: "because I took the tramadol before I left"  Are you having pain? Yes: NPRS scale: 7-8/10 Pain location: low back and down into legs Pain description: burning Aggravating factors: laying down flat Relieving factors: sitting down for a short period of time, tramadol brings it down a bit, taking pill for edema helps    PRECAUTIONS: None  RED FLAGS: None   WEIGHT BEARING RESTRICTIONS: No  FALLS:  Has patient fallen in last 6 months? No  LIVING ENVIRONMENT: Lives with: lives alone Lives in: House/apartment Stairs: Yes: Internal: 12+ steps; can reach both Has following equipment at home: Single point cane  OCCUPATION: retired Personnel officer  PLOF: Independent with gait and Independent with transfers  PATIENT GOALS: "I don't know"  NEXT MD VISIT: Dr. Shearon Stalls 08/14/2023  OBJECTIVE:   DIAGNOSTIC FINDINGS:  Lumbar Spine Xray 05/29/2023  AP lateral radiographs lumbar spine reviewed.  No hardware present.   Severe degenerative disc disease is present throughout the lumbar spine  with no acute compression fractures or spondylolisthesis.  PATIENT SURVEYS:  Modified Oswestry 28/50, severe disability   SCREENING FOR RED FLAGS: Bowel or bladder incontinence: No Spinal tumors: No Cauda equina syndrome: No Compression fracture: No Abdominal aneurysm: No  COGNITION: Overall cognitive status: Within functional limits for tasks assessed     SENSATION: WFL  POSTURE: forward head   LUMBAR ROM:   AROM eval  Flexion WFL "tightness"  Extension WFL  Right lateral flexion 75%, pain across low back  Left lateral flexion 75%, pain across low back  Right rotation WFL, "stiff"  Left rotation WFL, "stiff"   (Blank rows = not tested)  LOWER  EXTREMITY ROM:     Active  Right eval Left eval  Hip flexion [facility]  Hip extension    Hip abduction    Hip adduction    Hip internal rotation    Hip external rotation    Knee flexion [facility] WFL  Knee extension Tight HS Tight HS  Ankle dorsiflexion Tight heel cords Tight heel cords  Ankle plantarflexion    Ankle inversion    Ankle eversion     (Blank rows = not tested)  LOWER EXTREMITY MMT:    MMT Right eval Left eval  Hip flexion 5 5  Hip extension    Hip abduction    Hip adduction    Hip internal rotation    Hip external rotation    Knee flexion 5 5  Knee extension 5 5  Ankle dorsiflexion 5 5  Ankle plantarflexion    Ankle inversion    Ankle eversion     (Blank rows = not tested)  LUMBAR SPECIAL TESTS:  Slump test: Positive "Tightness" in legs, feels like the tightness and stiffness in his legs that limits him from standing still for too long  GAIT: Distance walked: various clinic distances Assistive device utilized: Single point cane Level of assistance: Modified independence Comments: mild antalgia  TODAY'S TREATMENT:  TherAct LTG review and outcome measure assessment Oswestry 21/50, 42% disability  Pain management discussion and education Educated patient to take medications as prescribed if prescribed every 6 hours even if feeling "just a little bit of pain" to stop chasing pain and instead stay ahead of it Discussed enrolling in [employer] program and using a gym to continue movement and stretches that feel helpful Discussed using a sock aide or towel equivalent if donning socks is painful in figure-4 position and seated flexion, pt is resistant to this suggestion  TherEx Nustep resistance 3/10 using BLEs for neural priming for reciprocal movement, dynamic cardiovascular warmup and increased amplitude of stepping. RPE of 5/10  following activity   "My legs feel warm"  PATIENT EDUCATION:  Education details: continue HEP, Silver Sneakers, outcome measures, sock aide, using pain medications as prescribed for full relief, wear a helmet when riding a bike Person educated: Patient Education method: Explanation, Demonstration, and Handouts Education comprehension: verbalized understanding and returned demonstration  HOME EXERCISE PROGRAM: Access Code: [email or web address] URL: [email or web address] Date: 06/10/2023 Prepared by: Peter Congo  Exercises - Seated Hamstring Stretch  - 1 x daily - 7 x weekly - 1 sets - 3-5 reps - 30 sec hold - Gastroc Stretch on Wall  - 1 x daily - 7 x weekly - 1 sets - 3-5 reps - Supine Lower Trunk Rotation  - 1 x daily - 7 x weekly - 1 sets - 10 reps - 5 sec hold - Child's Pose Stretch  - 1 x daily - 7 x weekly - 1 sets - 5-10 reps - 30 sec hold - Seated Piriformis Stretch with Trunk Bend  - 1 x daily - 7 x weekly - 1 sets - 3-5 reps - 30 sec hold - Supine Posterior Pelvic Tilt  - 1 x daily - 7 x weekly - 1 sets - 10 reps - 5 sec hold - Supine Bridge  - 1 x daily - 7 x weekly - 3 sets - 10 reps - 5 sec hold - Supine March with Posterior Pelvic Tilt  - 1 x daily - 7 x weekly - 3 sets - 10 reps  ASSESSMENT:  CLINICAL IMPRESSION:  Emphasis of skilled PT session on assessing LTGs and preparing for discharge. This date pt met 2/3 LTGs and did not meet 1/3 LTGs. Pt has improved in Oswestry disability score and has comprehensive HEP and knowledge of exercise routine to implement in community to self manage symptoms. Pt's chronic pain continues to limit activity tolerance. Continue HEP.   OBJECTIVE IMPAIRMENTS: Abnormal gait, decreased activity tolerance, difficulty walking, decreased ROM, impaired perceived functional ability, impaired flexibility, improper body mechanics, and pain.   ACTIVITY LIMITATIONS: carrying, lifting, bending, sitting, standing, and bed mobility  PARTICIPATION  LIMITATIONS: community activity  PERSONAL FACTORS: Age, Past/current experiences, Time since onset of injury/illness/exacerbation, and 3+ comorbidities:    past medical history of CAD S/P percutaneous coronary angioplasty, HTN, HLD, Iron deficiency anemia due to chronic blood loss, Iron malabsorption, Neurogenic claudication due to lumbar spinal stenosis, NSTEMI, Spinal stenosis of lumbar region, and Venous refluxare also affecting patient's functional outcome.   REHAB POTENTIAL: Fair time since onset/chronicity of pain; has tried PT previously, buy-in  CLINICAL DECISION MAKING: Stable/uncomplicated  EVALUATION COMPLEXITY: Low   GOALS: Goals reviewed with patient? Yes  SHORT TERM GOALS=LONG TERM GOALS due to length of POC   LONG TERM GOALS: Target date: 06/29/2023   Pt will be independent with final HEP for improved strength, transfers, gait  and for increased independence with management of pain symptoms. Baseline: not established at eval Goal status: MET  2.  Pt will improve score on the Oswestry to 23/50 to demonstrate decreased disability level Baseline: 28/50, severe disability (9/13), 21/50 (10/16) Goal status: MET  3.  Pt will report </= 6/10 pain with ADLs at most to demonstrate improved function Baseline: 8/10 (9/13), 7-8/10 (10/16) Goal status: NOT MET    Beverely Low, SPT  Peter Congo, PT, DPT, CSRS  07/03/2023, 5:13 PM

## 2023-07-04 ENCOUNTER — Other Ambulatory Visit: Payer: Self-pay

## 2023-07-04 DIAGNOSIS — I25118 Atherosclerotic heart disease of native coronary artery with other forms of angina pectoris: Secondary | ICD-10-CM

## 2023-07-04 DIAGNOSIS — I10 Essential (primary) hypertension: Secondary | ICD-10-CM

## 2023-07-04 MED ORDER — LOSARTAN POTASSIUM 50 MG PO TABS: 50.0000 mg | ORAL_TABLET | Freq: Every day | ORAL | 3 refills | Status: DC

## 2023-07-05 ENCOUNTER — Telehealth: Payer: Self-pay | Admitting: *Deleted

## 2023-07-05 MED ORDER — TRAMADOL HCL 50 MG PO TABS: 50.0000 mg | ORAL_TABLET | Freq: Three times a day (TID) | ORAL | 0 refills | Status: AC | PRN

## 2023-07-05 NOTE — Telephone Encounter (Signed)
notified

## 2023-07-05 NOTE — Telephone Encounter (Signed)
Message left for patient to call [facility] for refill status

## 2023-07-05 NOTE — Addendum Note (Signed)
Addended by: Elijah Birk on: 07/05/2023 01:56 PM   Modules accepted: Orders

## 2023-07-05 NOTE — Telephone Encounter (Signed)
Mr Miesse needs a refill on his tramadol. You sent one in for September and there is one dated for November 18th but not one for this month.

## 2023-07-10 ENCOUNTER — Telehealth: Payer: Self-pay

## 2023-07-10 NOTE — Telephone Encounter (Signed)
Lmom to discuss CT results. Waiting on a return call.  

## 2023-07-12 NOTE — Telephone Encounter (Signed)
Mailed CT results to pt.

## 2023-08-05 ENCOUNTER — Telehealth: Payer: Self-pay

## 2023-08-05 NOTE — Telephone Encounter (Signed)
Refilled for 2 weeks through [facility] 11/27 appointment

## 2023-08-05 NOTE — Telephone Encounter (Signed)
Patient requesting refill on tramadol

## 2023-08-14 ENCOUNTER — Encounter: Payer: Self-pay | Admitting: Physical Medicine and Rehabilitation

## 2023-08-14 ENCOUNTER — Encounter
Payer: Medicare Other | Attending: Physical Medicine and Rehabilitation | Admitting: Physical Medicine and Rehabilitation

## 2023-08-14 VITALS — BP 145/88 | HR 63 | Ht 70.0 in | Wt 214.0 lb

## 2023-08-14 DIAGNOSIS — M25561 Pain in right knee: Secondary | ICD-10-CM | POA: Insufficient documentation

## 2023-08-14 DIAGNOSIS — Z9889 Other specified postprocedural states: Secondary | ICD-10-CM | POA: Diagnosis present

## 2023-08-14 DIAGNOSIS — G8929 Other chronic pain: Secondary | ICD-10-CM | POA: Insufficient documentation

## 2023-08-14 DIAGNOSIS — M25562 Pain in left knee: Secondary | ICD-10-CM | POA: Insufficient documentation

## 2023-08-14 DIAGNOSIS — Z029 Encounter for administrative examinations, unspecified: Secondary | ICD-10-CM | POA: Diagnosis present

## 2023-08-14 DIAGNOSIS — Z5181 Encounter for therapeutic drug level monitoring: Secondary | ICD-10-CM | POA: Insufficient documentation

## 2023-08-14 DIAGNOSIS — G894 Chronic pain syndrome: Secondary | ICD-10-CM | POA: Insufficient documentation

## 2023-08-14 MED ORDER — TRAMADOL HCL 50 MG PO TABS
ORAL_TABLET | ORAL | 0 refills | Status: AC
Start: 2023-08-14 — End: 2023-09-02

## 2023-08-14 MED ORDER — TRAMADOL HCL 50 MG PO TABS
50.0000 mg | ORAL_TABLET | Freq: Four times a day (QID) | ORAL | 0 refills | Status: AC | PRN
Start: 2023-09-02 — End: 2023-10-02

## 2023-08-14 NOTE — Progress Notes (Signed)
Subjective:    Patient ID: Noah Scott, male    DOB: 07/07/1936, 87 y.o.   MRN: [id number]  HPI   Noah Scott is a 87 y.o. 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   They are presenting to PM&R clinic for follow up related to  treatment of low back pain and bilateral leg pain .   Plan from last visit:  Leg pain, bilateral S/P lumbar laminectomy Neurogenic claudication due to lumbar spinal stenosis -     Ambulatory referral to Physical Therapy   EMG: Had done next door a few years ago; unsure of results, says "everything was fine".    MRI lumbar spine 2019:  IMPRESSION:  1. Multilevel spondylosis of the lumbar spine as described.  2. Mild right foraminal narrowing at T12-L1.  3. Mild subarticular and foraminal narrowing bilaterally at L1-2.  4. Moderate left and mild right subarticular narrowing at L2-3 with  moderate foraminal stenosis bilaterally, worse on the left.  5. Mild subarticular and moderate bilateral foraminal stenosis at  L3-4.  6. Moderate right and mild left subarticular narrowing with moderate  foraminal narrowing bilaterally at L4-5.  7. Moderate right and mild left subarticular narrowing with mild  foraminal narrowing bilaterally at L5-S1.    Start physical therapy to work on stretching, strengthening, range of motion, and improved gait tolerance.  I suspect he would benefit from epidural steroid injections in the future, but before we can do this you will need to have a full trial of physical therapy, so I recommend attending the full 4-6  appointments so that insurance would approve potential injections in the future.   If there is no improvement in your leg pain with these modalities, we may consider repeating your EMG or getting new imaging of your low back to assess for which levels would benefit most from an injection.  This could be done with Dr. Wynn Banker at our office.   Chronic pain of both knees -     Ambulatory referral to Orthopedic Surgery -     DG Knee 1-2 Views Left; Future -     DG Knee 1-2 Views Right; Future   I am referring you to orthopedic surgery and getting x-rays of your knees to look for advanced arthritis, you can discuss with them possible injections versus need further intervention. Ortho can perform injections if needed.    Continue being active, doing home exercises   Chronic pain syndrome Encounter for opiate analgesic use agreement Encounter for medication monitoring   When your next medication refill is due in September, I will refill your tramadol 50 mg 3 times daily for 3 months.  After that, follow-up with our clinic for further refills.  You will have these appointments with either myself or our nurse practitioner Riley Lam.   Start Robaxin 500 mg at nighttime to help with muscle pain and spasms   Interval Hx:  - Therapies: He did PT for 2 days, then was given HEP. He is compliant with this and goes to the gym and does cycling kicks and arm cycles. He says he has no pain while doing it, only at the beginning. Standing for too long  is "agony" on his back. A few times per day he raises his feet for edema which helps with his legs.    - Follow ups: Saw Dr/ August Saucer with Ortho 9/11: Impression is complex pain symptoms in a patient who has had prior back surgery and appears to have continued ongoing back symptoms. He also has significant knee arthritis with varus alignment in both knees. Plan is bilateral knee cortisone injections and we will approve gel for both knees as well.   - - Patient states  these injections lasted a few months; "it wasn't bad but because of arthritis it relieved the pain a little".    - Falls: none   - DME: Using a single point cane both at home and in the community for balance and to relieve his back.    - Medications: He was taking tramadol prior to seeing me; when he went down to every 8 hours, he felt it did not last long enough. He is upset that it was cut to three times daily. He has not been running out of medicaiton early because he has been taking tylenol supplementarily.    - Other concerns: Recently noticed his left shoulder will get painful anteriorly at nighttime if he does nit sleep with is above his head. Vics vapo-rub helps a little. He does usually sleep on his side, no preference toward one side.  He states every 2 hours at nighttime he is up using the bathroom or up with pain.   Pain Inventory Average Pain 7 Pain Right Now 8 My pain is burning and aching  In the last 24 hours, has pain interfered with the following? General activity 7 Relation with others 7 Enjoyment of life 6 What TIME of day is your pain at its worst? evening and night Sleep (in general) Poor  Pain is worse with: walking, bending, sitting, standing, and some activites Pain improves with: medication and injections Relief from Meds: 3  Family History  Problem Relation Age of Onset   Heart disease Father        Pt does not know details   Stroke Neg Hx    Diabetes Neg Hx    Social History   Socioeconomic History   Marital status: Widowed    Spouse name: Not on file   Number of children: 1   Years of education: 12   Highest education level: Not on file  Occupational History   Occupation: retired Geologist, engineering  Tobacco Use   Smoking status: Former    Current packs/day: 0.00    Types: Cigarettes    Quit date: 03/02/1969    Years since quitting: 54.4   Smokeless tobacco: Never  Vaping Use   Vaping status: Never Used  Substance and Sexual  Activity   Alcohol use: Not Currently   Drug use: No   Sexual activity: Not on file  Other Topics Concern   Not on file  Social History Narrative   Lives alone in an apartment on the first floor.  Has one daughter.  Retired Geologist, engineering.  Education: some college.    Social Determinants of Health   Financial Resource Strain: Low Risk  (10/17/2022)   Overall Financial Resource Strain (CARDIA)    Difficulty of Paying Living Expenses: Not hard at all  Food Insecurity: No Food Insecurity (10/17/2022)   Hunger Vital Sign    Worried About Running Out of Food in the Last Year: Never true    Ran Out of Food in the  Last Year: Never true  Transportation Needs: No Transportation Needs (10/17/2022)   PRAPARE - Administrator, Civil Service (Medical): No    Lack of Transportation (Non-Medical): No  Physical Activity: Insufficiently Active (10/17/2022)   Exercise Vital Sign    Days of Exercise per Week: 7 days    Minutes of Exercise per Session: 10 min  Stress: No Stress Concern Present (10/17/2022)   [employer] of Occupational Health - Occupational Stress Questionnaire    Feeling of Stress : Not at all  Social Connections: Moderately Isolated (10/17/2022)   Social Connection and Isolation Panel [NHANES]    Frequency of Communication with Friends and Family: More than three times a week    Frequency of Social Gatherings with Friends and Family: More than three times a week    Attends Religious Services: Never    Database administrator or Organizations: Yes    Attends Banker Meetings: 1 to 4 times per year    Marital Status: Widowed   Past Surgical History:  Procedure Laterality Date   CORONARY STENT INTERVENTION N/A 11/25/2017   Procedure: CORONARY STENT INTERVENTION;  Surgeon: Marykay Lex, MD;  Location: [facility] INVASIVE CV LAB;  Service: Cardiovascular:   m-dRCA 95%  (DES PCI - 0%.  Resolute ONYX DES 2.75 x 18 mm - ~3.0 mm). Ost-proxRCA 75-65% (DES  PCI-0%. 2 overlapping Resolute ONYX DES 3.0 x 26 & 3.0 x 8 mm --> 3.5-3.3 mm)   CORONARY STENT INTERVENTION  2001   ([place]): PCI OM1   LEA DOPPLERS  12/12/2017   Normal bilateral ABIs and TBI's.   LEFT HEART CATH AND CORONARY ANGIOGRAPHY N/A 11/25/2017   Procedure: LEFT HEART CATH AND CORONARY ANGIOGRAPHY;  Surgeon: Marykay Lex, MD;  Location: [facility] INVASIVE CV LAB;  Service: Cardiovascular:  Ost-proxRCA 75&65%(DES PCI), m-dRCA 95% (DES PCI), dRCA 50%-25% ost rPDA. ostOM1 65% ISR (from 2001).    LUMBAR LAMINECTOMY/DECOMPRESSION MICRODISCECTOMY Bilateral 06/13/2018   Procedure: Laminectomy and Foraminotomy - L1-L2 - L2-L3 - L3-L4 - bilateral;  Surgeon: Tia Alert, MD;  Location: [facility] OR;  Service: Neurosurgery;  Laterality: Bilateral;   TRANSTHORACIC ECHOCARDIOGRAM  11/23/2017   a) 11/2017: Nl LV size & Fxn. EF 60 - 65%.  No RWMA.  GR 1 DD.  Mild RV dilation.; b) 05/2020:([facility]): Normal LV size and function.  EF 60 and 65%.  Normal RV size and function.  Aortic sclerosis with no stenosis.  No AI.  Mildly dilated ascending aorta.  CVP ~5 mm.   TRANSTHORACIC ECHOCARDIOGRAM  03/31/2021   EF 55 to 60%.  GR 1 DD.  Ascending aorta measured at 41 mm-moderate dilation.  Recommended follow-up with imaging study in 6 months to reassess.   Past Surgical History:  Procedure Laterality Date   CORONARY STENT INTERVENTION N/A 11/25/2017   Procedure: CORONARY STENT INTERVENTION;  Surgeon: Marykay Lex, MD;  Location: [facility] INVASIVE CV LAB;  Service: Cardiovascular:   m-dRCA 95%  (DES PCI - 0%.  Resolute ONYX DES 2.75 x 18 mm - ~3.0 mm). Ost-proxRCA 75-65% (DES PCI-0%. 2 overlapping Resolute ONYX DES 3.0 x 26 & 3.0 x 8 mm --> 3.5-3.3 mm)   CORONARY STENT INTERVENTION  2001   ([place]): PCI OM1   LEA DOPPLERS  12/12/2017   Normal bilateral ABIs and TBI's.   LEFT HEART CATH AND CORONARY ANGIOGRAPHY N/A 11/25/2017   Procedure: LEFT HEART CATH AND CORONARY ANGIOGRAPHY;  Surgeon: Marykay Lex, MD;   Location:  [facility] INVASIVE CV LAB;  Service: Cardiovascular:  Ost-proxRCA 75&65%(DES PCI), m-dRCA 95% (DES PCI), dRCA 50%-25% ost rPDA. ostOM1 65% ISR (from 2001).    LUMBAR LAMINECTOMY/DECOMPRESSION MICRODISCECTOMY Bilateral 06/13/2018   Procedure: Laminectomy and Foraminotomy - L1-L2 - L2-L3 - L3-L4 - bilateral;  Surgeon: Tia Alert, MD;  Location: [facility] OR;  Service: Neurosurgery;  Laterality: Bilateral;   TRANSTHORACIC ECHOCARDIOGRAM  11/23/2017   a) 11/2017: Nl LV size & Fxn. EF 60 - 65%.  No RWMA.  GR 1 DD.  Mild RV dilation.; b) 05/2020:([facility]): Normal LV size and function.  EF 60 and 65%.  Normal RV size and function.  Aortic sclerosis with no stenosis.  No AI.  Mildly dilated ascending aorta.  CVP ~5 mm.   TRANSTHORACIC ECHOCARDIOGRAM  03/31/2021   EF 55 to 60%.  GR 1 DD.  Ascending aorta measured at 41 mm-moderate dilation.  Recommended follow-up with imaging study in 6 months to reassess.   Past Medical History:  Diagnosis Date   CAD S/P percutaneous coronary angioplasty 2001   a) 2001 ([place]): PCI OM1 - now with ~65% ISR.;; b) NSTEMI 11/2017 - ost-prox RCA 75-65% (Overlapping ONYX DES 3.0 x 26 & 3.0 x 8 --> 3.5-3.3 mm). m-dRCA 95% (ONYX DES 2.75 x 18 mm -> 3.0 mm). Ost OM1 65% ISR (med Rx). dRCA-OstRDA (50%-25%)   Essential (primary) hypertension 11/21/2017   Hyperlipidemia 11/21/2017   Iron deficiency anemia due to chronic blood loss 08/08/2021   Iron malabsorption 08/08/2021   Neurogenic claudication due to lumbar spinal stenosis 02/06/2018   NSTEMI (non-ST elevated myocardial infarction) (HCC) 11/21/2017   Multiple RCA lesions - 2 Overlapping DES Ost-Prox RCA & 1 in mid-distal RCA   Spinal stenosis of lumbar region    Venous reflux 11/2020   Venous Dopplers 12/15/2020: No DVT or superficial venous thrombosis bilaterally.  Venous reflux noted in bilateral FV and saphenofemoral junctions, R GSV in the thigh and R SFV, left proximal thigh GSV. -->  No invasive options due to deep and  superficial venous reflux.   Ht 5\' 10"  (1.778 m)   BMI 29.70 kg/m   Opioid Risk Score:   Fall Risk Score:  `1  Depression screen [facility] 2/9     05/13/2023    1:42 PM 10/17/2022   10:10 AM 08/13/2022   10:33 AM 05/02/2018   12:07 PM 02/12/2018    4:54 PM  Depression screen PHQ 2/9  Decreased Interest 0 0 0 0 0  Down, Depressed, Hopeless 0 0 0 0 0  PHQ - 2 Score 0 0 0 0 0  Altered sleeping 1      Tired, decreased energy 1      Change in appetite 0      Feeling bad or failure about yourself  0      Trouble concentrating 0      Moving slowly or fidgety/restless 0      Suicidal thoughts 0      PHQ-9 Score 2          Review of Systems  Musculoskeletal:  Positive for back pain and gait problem.       Left shoulder and B/L knee pain   All other systems reviewed and are negative.     Objective:   Physical Exam Constitution: Appropriate appearance for age. No apparent distress  Resp: No respiratory distress. No accessory muscle usage. on RA and CTAB Cardio: Well perfused appearance. No peripheral edema. Abdomen: Nondistended. Nontender.   Psych: Appropriate  mood and affect. Neuro: AAOx4. No apparent cognitive deficits    Neurologic Exam:   Sensory exam: revealed normal sensation in all dermatomal regions in bilateral lower extremities  - no appreciable reduced sensation to light touch in right medial knee today Motor exam: strength 5/5 throughout bilateral legs Coordination: Fine motor coordination was normal.   Gait: +antalgic gait favoring right leg; stiff.    MSK: Back -no apparent deformity.  No tenderness to palpation throughout bilateral lumbar paraspinals, PSIS, SI joints, or trochanteric bursa.  Mild discomfort with forward and backward bending, no obvious worse position.  B/l knees: + TTP bilateral medial knee joint lines + Mild bilateral knee effusions, unchanged from last time       Assessment & Plan:   Noah Scott is a 87 y.o. year old male  who  has a  past medical history of CAD S/P percutaneous coronary angioplasty (2001), Essential (primary) hypertension (11/21/2017), Hyperlipidemia (11/21/2017), Iron deficiency anemia due to chronic blood loss (08/08/2021), Iron malabsorption (08/08/2021), Neurogenic claudication due to lumbar spinal stenosis (02/06/2018), NSTEMI (non-ST elevated myocardial infarction) (HCC) (11/21/2017), Spinal stenosis of lumbar region, and Venous reflux (11/2020).   They are presenting to PM&R clinic for follow up related to  treatment of low back pain and bilateral leg pain .   Chronic pain syndrome Encounter for opiate analgesic use agreement Encounter for medication monitoring  Increase tramadol to 1-1.5 tabs every 6 hours as needed; I have given one script to supplement your current one and a new script for this dose on 12/16.  Call our clinic if you need anything adjusted!  Follow-up with my nurse practitioner Riley Lam in 2 months; if things are stable on your current regimen next visit, we can increase appointments to every 6 months.  If not, we will follow-up more frequently.  Feel free to use MyChart between appointments to discuss any acute issues, making usually get back to within 48 hours.   Chronic pain of both knees Benefit from leasant bilateral steroid injections in his knees with Dr. August Saucer; he can continue these for pain management as needed.  Agree with ongoing use of a cane for additional support while walking.  S/P lumbar laminectomy Was recently discharged from physical therapy with home exercise program after only 2 sessions.  He remains very active at home, compliant with home exercises.  Can continue Robaxin as needed.  Tramadol adjustment as above.  Other orders -     traMADol HCl; Increase current script to 1 to 1.5 tabs every 6 hours as needed.  Dispense: 90 tablet; Refill: 0 -     traMADol HCl; Take 1-1.5 tablets (50-75 mg total) by mouth every 6 (six) hours as needed for moderate pain (pain  score 4-6) or severe pain (pain score 7-10).  Dispense: 180 tablet; Refill: 0

## 2023-08-14 NOTE — Patient Instructions (Addendum)
Follow-up with my nurse practitioner Riley Lam in 2 months; if things are stable on your current regimen next visit, we can increase appointments to every 6 months.  If not, we will follow-up more frequently.  Feel free to use MyChart between appointments to discuss any acute issues, making usually get back to within 48 hours.   Increase tramadol to 1-1.5 tabs every 6 hours as needed; I have given one script to supplement your current one and a new script for this dose on 12/16.  Call our clinic if you need anything adjusted!

## 2023-08-22 ENCOUNTER — Telehealth: Payer: Self-pay | Admitting: Cardiology

## 2023-08-22 DIAGNOSIS — I2510 Atherosclerotic heart disease of native coronary artery without angina pectoris: Secondary | ICD-10-CM

## 2023-08-22 NOTE — Telephone Encounter (Signed)
*  STAT* If patient is at the pharmacy, call can be transferred to refill team.   1. Which medications need to be refilled? (please list name of each medication and dose if known) prasugrel (EFFIENT) 5 MG TABS tablet   2. Which pharmacy/location (including street and city if local pharmacy) is medication to be sent to?  [id number] - [place], [place] - [address]    3. Do they need a 30 day or 90 day supply? 90

## 2023-08-23 MED ORDER — PRASUGREL HCL 5 MG PO TABS: 5.0000 mg | ORAL_TABLET | Freq: Every day | ORAL | 3 refills | Status: DC

## 2023-08-23 NOTE — Telephone Encounter (Signed)
 RX sent to requested Pharmacy

## 2023-08-25 ENCOUNTER — Other Ambulatory Visit: Payer: Self-pay | Admitting: Cardiology

## 2023-08-25 DIAGNOSIS — I2510 Atherosclerotic heart disease of native coronary artery without angina pectoris: Secondary | ICD-10-CM

## 2023-09-01 ENCOUNTER — Other Ambulatory Visit: Payer: Self-pay | Admitting: Medical

## 2023-09-04 ENCOUNTER — Other Ambulatory Visit: Payer: Self-pay | Admitting: *Deleted

## 2023-09-04 ENCOUNTER — Encounter: Payer: Self-pay | Admitting: Podiatry

## 2023-09-04 ENCOUNTER — Ambulatory Visit (INDEPENDENT_AMBULATORY_CARE_PROVIDER_SITE_OTHER): Payer: Medicare Other | Admitting: Podiatry

## 2023-09-04 DIAGNOSIS — K909 Intestinal malabsorption, unspecified: Secondary | ICD-10-CM

## 2023-09-04 DIAGNOSIS — B351 Tinea unguium: Secondary | ICD-10-CM | POA: Diagnosis not present

## 2023-09-04 DIAGNOSIS — M79674 Pain in right toe(s): Secondary | ICD-10-CM

## 2023-09-04 DIAGNOSIS — M79675 Pain in left toe(s): Secondary | ICD-10-CM | POA: Diagnosis not present

## 2023-09-04 DIAGNOSIS — D500 Iron deficiency anemia secondary to blood loss (chronic): Secondary | ICD-10-CM

## 2023-09-04 NOTE — Progress Notes (Signed)
This patient returns to the office for evaluation and treatment of long thick painful nails .  This patient is unable to trim his own nails since the patient cannot reach his feet.  Patient says the nails are painful walking and wearing his shoes.  He returns for preventive foot care services.  General Appearance  Alert, conversant and in no acute stress.  Vascular  Dorsalis pedis and posterior tibial  pulses are palpable  bilaterally.  Capillary return is within normal limits  bilaterally. Temperature is within normal limits  bilaterally.  Neurologic  Senn-Weinstein monofilament wire test within normal limits  bilaterally. Muscle power within normal limits bilaterally.  Nails Thick disfigured discolored nails with subungual debris  from hallux to fifth toes bilaterally. No evidence of bacterial infection or drainage bilaterally.  Orthopedic  No limitations of motion  feet .  No crepitus or effusions noted.  No bony pathology or digital deformities noted.  HAV  B/L. Tailors bunion 5th left.  Hammer toe 2 left.  Skin  normotropic skin with no porokeratosis noted bilaterally.  No signs of infections or ulcers noted.     Onychomycosis  Pain in toes right foot  Pain in toes left foot  Debridement  of nails  1-5  B/L with a nail nipper.  Nails were then filed using a dremel tool with no incidents.    RTC  3 months    Helane Gunther DPM

## 2023-09-05 ENCOUNTER — Encounter: Payer: Self-pay | Admitting: Medical Oncology

## 2023-09-05 ENCOUNTER — Inpatient Hospital Stay: Payer: Medicare Other | Attending: Medical Oncology | Admitting: Medical Oncology

## 2023-09-05 ENCOUNTER — Inpatient Hospital Stay: Payer: Medicare Other

## 2023-09-05 VITALS — BP 115/59 | HR 64 | Temp 98.5°F | Resp 19 | Ht 70.0 in | Wt 213.4 lb

## 2023-09-05 DIAGNOSIS — D500 Iron deficiency anemia secondary to blood loss (chronic): Secondary | ICD-10-CM | POA: Diagnosis not present

## 2023-09-05 DIAGNOSIS — Z79899 Other long term (current) drug therapy: Secondary | ICD-10-CM | POA: Insufficient documentation

## 2023-09-05 DIAGNOSIS — K909 Intestinal malabsorption, unspecified: Secondary | ICD-10-CM

## 2023-09-05 DIAGNOSIS — D509 Iron deficiency anemia, unspecified: Secondary | ICD-10-CM | POA: Diagnosis present

## 2023-09-05 DIAGNOSIS — D631 Anemia in chronic kidney disease: Secondary | ICD-10-CM | POA: Diagnosis not present

## 2023-09-05 LAB — CBC WITH DIFFERENTIAL (CANCER CENTER ONLY)
Abs Immature Granulocytes: 0.02 10*3/uL (ref 0.00–0.07)
Basophils Absolute: 0 10*3/uL (ref 0.0–0.1)
Basophils Relative: 0 %
Eosinophils Absolute: 0.1 10*3/uL (ref 0.0–0.5)
Eosinophils Relative: 1 %
HCT: 38.2 % — ABNORMAL LOW (ref 39.0–52.0)
Hemoglobin: 12.4 g/dL — ABNORMAL LOW (ref 13.0–17.0)
Immature Granulocytes: 0 %
Lymphocytes Relative: 14 %
Lymphs Abs: 0.8 10*3/uL (ref 0.7–4.0)
MCH: 30.6 pg (ref 26.0–34.0)
MCHC: 32.5 g/dL (ref 30.0–36.0)
MCV: 94.3 fL (ref 80.0–100.0)
Monocytes Absolute: 0.7 10*3/uL (ref 0.1–1.0)
Monocytes Relative: 12 %
Neutro Abs: 4.1 10*3/uL (ref 1.7–7.7)
Neutrophils Relative %: 73 %
Platelet Count: 199 10*3/uL (ref 150–400)
RBC: 4.05 MIL/uL — ABNORMAL LOW (ref 4.22–5.81)
RDW: 13.4 % (ref 11.5–15.5)
WBC Count: 5.7 10*3/uL (ref 4.0–10.5)
nRBC: 0 % (ref 0.0–0.2)

## 2023-09-05 LAB — CMP (CANCER CENTER ONLY)
ALT: 8 U/L (ref 0–44)
AST: 14 U/L — ABNORMAL LOW (ref 15–41)
Albumin: 4 g/dL (ref 3.5–5.0)
Alkaline Phosphatase: 62 U/L (ref 38–126)
Anion gap: 6 (ref 5–15)
BUN: 16 mg/dL (ref 8–23)
CO2: 33 mmol/L — ABNORMAL HIGH (ref 22–32)
Calcium: 9.2 mg/dL (ref 8.9–10.3)
Chloride: 102 mmol/L (ref 98–111)
Creatinine: 1.1 mg/dL (ref 0.61–1.24)
GFR, Estimated: >60 mL/min
Glucose, Bld: 94 mg/dL (ref 70–99)
Potassium: 4.5 mmol/L (ref 3.5–5.1)
Sodium: 141 mmol/L (ref 135–145)
Total Bilirubin: 0.4 mg/dL
Total Protein: 6.8 g/dL (ref 6.5–8.1)

## 2023-09-05 LAB — IRON AND IRON BINDING CAPACITY (CC-WL,HP ONLY)
Iron: 47 ug/dL (ref 45–182)
Saturation Ratios: 18 % (ref 17.9–39.5)
TIBC: 263 ug/dL (ref 250–450)
UIBC: 216 ug/dL (ref 117–376)

## 2023-09-05 LAB — RETICULOCYTES
Immature Retic Fract: 7.8 % (ref 2.3–15.9)
RBC.: 3.99 MIL/uL — ABNORMAL LOW (ref 4.22–5.81)
Retic Count, Absolute: 52.3 10*3/uL (ref 19.0–186.0)
Retic Ct Pct: 1.3 % (ref 0.4–3.1)

## 2023-09-05 LAB — FERRITIN: Ferritin: 746 ng/mL — ABNORMAL HIGH (ref 24–336)

## 2023-09-05 NOTE — Progress Notes (Signed)
Hematology and Oncology Follow Up Visit  CHALES UMEMOTO [id number] [birth year] 87 y.o. 09/05/2023   Principle Diagnosis:  Iron deficiency anemia  Current Therapy:   IV iron given-Monoferric-on  04/02/2022     Interim History:  Mr. Grosz is back for his follow-up for mild IDA. Upon initial consult it was suspected to possibly nutritional in nature. Erythropoietin level was low though at 7.4.   He is UTD on his colonoscopies.   He reports troubles with pain management and his chronic pain. Pain is due to his chronic osteoarthritis.   He has noticed some SOB with exercise which started a few months ago. He think that this is due to increase saliva excretion from his partials. He denies chest pain.   When we last saw him back in June his ferritin was 797 with an iron saturation of 24%.  He has had no change in bowel or bladder habits.  There is been no melena or bright red blood per rectum.  He has had no cough or shortness of breath.  He has had no fever.  There is been no rashes.  He has had no leg swelling.  Overall, I would say that his performance status is probably ECOG 1.  Wt Readings from Last 3 Encounters:  09/05/23 213 lb 6.4 oz (96.8 kg)  08/14/23 214 lb (97.1 kg)  06/19/23 207 lb (93.9 kg)   Medications:  Current Outpatient Medications:    acetaminophen (TYLENOL) 650 MG CR tablet, Take 650 mg by mouth every 8 (eight) hours as needed for pain., Disp: , Rfl:    Aromatic Inhalants (VICKS VAPOR INHALER IN), Inhale 1 Inhaler into the lungs as needed (stuffy nose)., Disp: , Rfl:    Cholecalciferol (D3 EXTRA STRENGTH) 125 MCG (5000 UT) CAPS, Take 1 capsule by mouth daily., Disp: , Rfl:    cyanocobalamin (VITAMIN B12) 1000 MCG tablet, Take 1,000 mcg by mouth daily., Disp: , Rfl:    docusate sodium (COLACE) 100 MG capsule, Take 100 mg by mouth 2 (two) times daily., Disp: , Rfl:    doxazosin (CARDURA) 8 MG tablet, Take 8 mg by mouth daily., Disp: , Rfl:    furosemide (LASIX) 20  MG tablet, TAKE 2 TABLET EVERY OTHER DAY AND 1 TABLET ON ODD DAYS. MAY TAKE AN ADDITIONAL TABLET FOR WEIGHT GAIN OF 3LBS OVERNIGHT OR INCREASED SWELLING, Disp: 200 tablet, Rfl: 3   losartan (COZAAR) 50 MG tablet, Take 1 tablet (50 mg total) by mouth daily., Disp: 90 tablet, Rfl: 3   magnesium oxide (MAG-OX) 400 (240 Mg) MG tablet, Take 400 mg by mouth daily., Disp: , Rfl:    metoprolol tartrate (LOPRESSOR) 25 MG tablet, TAKE 1/2 TABLET(12.5 MG) BY MOUTH TWICE DAILY, Disp: 30 tablet, Rfl: 3   prasugrel (EFFIENT) 5 MG TABS tablet, Take 1 tablet (5 mg total) by mouth daily., Disp: 90 tablet, Rfl: 3   rosuvastatin (CRESTOR) 20 MG tablet, Take 1 tablet (20 mg total) by mouth daily., Disp: 90 tablet, Rfl: 3   traMADol (ULTRAM) 50 MG tablet, Take 1-1.5 tablets (50-75 mg total) by mouth every 6 (six) hours as needed for moderate pain (pain score 4-6) or severe pain (pain score 7-10)., Disp: 180 tablet, Rfl: 0   nitroGLYCERIN (NITROSTAT) 0.6 MG SL tablet, Place 0.6 mg under the tongue as needed. (Patient not taking: Reported on 09/05/2023), Disp: , Rfl:   Allergies:  Allergies  Allergen Reactions   Plavix [Clopidogrel Bisulfate] Swelling   Brilinta [Ticagrelor] Cough   Enalapril  Other (See Comments)    Past Medical History, Surgical history, Social history, and Family History were reviewed and updated.  Review of Systems: Review of Systems  Constitutional:  Negative for unexpected weight change.  HENT:  Negative.    Eyes: Negative.   Respiratory: Negative.    Cardiovascular: Negative.   Gastrointestinal: Negative.   Endocrine: Negative.   Genitourinary: Negative.    Musculoskeletal: Negative.   Skin: Negative.   Neurological: Negative.   Hematological: Negative.   Psychiatric/Behavioral: Negative.      Physical Exam:  height is 5\' 10"  (1.778 m) and weight is 213 lb 6.4 oz (96.8 kg). His oral temperature is 98.5 F (36.9 C). His blood pressure is 115/59 (abnormal) and his pulse is 64. His  respiration is 19 and oxygen saturation is 100%.   Wt Readings from Last 3 Encounters:  09/05/23 213 lb 6.4 oz (96.8 kg)  08/14/23 214 lb (97.1 kg)  06/19/23 207 lb (93.9 kg)    Physical Exam Vitals reviewed.  Constitutional:      Comments: Gently using a cane.  HENT:     Head: Normocephalic and atraumatic.  Eyes:     Pupils: Pupils are equal, round, and reactive to light.  Cardiovascular:     Rate and Rhythm: Normal rate and regular rhythm.     Heart sounds: Normal heart sounds.  Pulmonary:     Effort: Pulmonary effort is normal.     Breath sounds: Normal breath sounds.  Abdominal:     General: Bowel sounds are normal.     Palpations: Abdomen is soft.  Musculoskeletal:        General: No tenderness or deformity. Normal range of motion.     Cervical back: Normal range of motion.  Lymphadenopathy:     Cervical: No cervical adenopathy.  Skin:    General: Skin is warm and dry.     Findings: No erythema or rash.  Neurological:     Mental Status: He is alert and oriented to person, place, and time.  Psychiatric:        Behavior: Behavior normal.        Thought Content: Thought content normal.        Judgment: Judgment normal.      Lab Results  Component Value Date   WBC 5.7 09/05/2023   HGB 12.4 (L) 09/05/2023   HCT 38.2 (L) 09/05/2023   MCV 94.3 09/05/2023   PLT 199 09/05/2023     Chemistry      Component Value Date/Time   NA 142 06/19/2023 1604   K 4.3 06/19/2023 1604   CL 100 06/19/2023 1604   CO2 26 06/19/2023 1604   BUN 16 06/19/2023 1604   CREATININE 0.93 06/19/2023 1604   CREATININE 1.05 03/05/2023 1232      Component Value Date/Time   CALCIUM 8.9 06/19/2023 1604   ALKPHOS 67 03/05/2023 1232   AST 21 03/05/2023 1232   ALT 17 03/05/2023 1232   BILITOT 0.4 03/05/2023 1232     Impression and Plan: Mr. Medal is a very nice 87 year old white male.  He has recurrent iron deficiency anemia of unknown origin. Suspected to be due to some erythropoietin  deficiency however he does not meet criteria for EPO due to his Hgb level is above 11. He also has had scant iron deficiency. He last got IV iron back in July.    Today his Hgb is 12.4 which is stable from 6 months ago.   RTC 6 months APP,  labs (CBC w/, CMP, iron, ferritin, retic)-[place]  Rushie Chestnut, PA-C 12/19/20241:52 PM

## 2023-09-12 ENCOUNTER — Other Ambulatory Visit: Payer: Self-pay | Admitting: Medical

## 2023-09-12 MED ORDER — DOXAZOSIN MESYLATE 8 MG PO TABS: 8.0000 mg | ORAL_TABLET | Freq: Every day | ORAL | 11 refills | Status: DC

## 2023-09-12 NOTE — Telephone Encounter (Signed)
Rx refill sent to pharmacy. 

## 2023-09-12 NOTE — Telephone Encounter (Signed)
Copied from CRM [id number]. Topic: Clinical - Medication Refill >> Sep 12, 2023 12:20 PM Lorin Glass B wrote: Most Recent Primary Care Visit:  Provider: Esperanza Richters  Department: [facility]  Visit Type: OFFICE VISIT  Date: 05/28/2023  Medication: doxazosin (CARDURA) 8 MG tablet  Has the patient contacted their pharmacy? Yes (Agent: If no, request that the patient contact the pharmacy for the refill. If patient does not wish to contact the pharmacy document the reason why and proceed with request.) (Agent: If yes, when and what did the pharmacy advise?)  Is this the correct pharmacy for this prescription? Yes If no, delete pharmacy and type the correct one.  This is the patient's preferred pharmacy:   [id number] - [place], [place] - [address] [place] [address] Phone: [phone] Fax: [phone]   Has the prescription been filled recently? Yes  Is the patient out of the medication? No  Has the patient been seen for an appointment in the last year OR does the patient have an upcoming appointment? Yes  Can we respond through MyChart? No  Agent: Please be advised that Rx refills may take up to 3 business days. We ask that you follow-up with your pharmacy.

## 2023-09-13 ENCOUNTER — Telehealth: Payer: Self-pay

## 2023-09-13 NOTE — Telephone Encounter (Signed)
Called pharmacy given he had a script with instructions to fill 12/16; they confirmed they have that script but missed filling it, will resolve now.

## 2023-09-13 NOTE — Telephone Encounter (Signed)
Patient needs refill on tramadol sent to pharmacy

## 2023-09-23 ENCOUNTER — Telehealth: Payer: Self-pay | Admitting: Cardiology

## 2023-09-23 DIAGNOSIS — I10 Essential (primary) hypertension: Secondary | ICD-10-CM

## 2023-09-23 DIAGNOSIS — I25118 Atherosclerotic heart disease of native coronary artery with other forms of angina pectoris: Secondary | ICD-10-CM

## 2023-09-23 NOTE — Telephone Encounter (Signed)
*  STAT* If patient is at the pharmacy, call can be transferred to refill team.   1. Which medications need to be refilled? (please list name of each medication and dose if known) losartan (COZAAR) 50 MG tablet    2. Which pharmacy/location (including street and city if local pharmacy) is medication to be sent to? [id number] - [place], [place] - [address]   3. Do they need a 30 day or 90 day supply?   90 day supply

## 2023-10-03 ENCOUNTER — Telehealth: Payer: Self-pay | Admitting: Medical

## 2023-10-03 NOTE — Telephone Encounter (Signed)
Copied from CRM [id number]. Topic: Medicare AWV >> Oct 03, 2023 11:18 AM Payton Doughty wrote: Reason for CRM: Called LVM 10/03/2023 to schedule AWV. Please schedule office or virtual visits.   Verlee Rossetti; Care Guide Ambulatory Clinical Support [place] [facility] Direct Dial: [phone]

## 2023-10-14 ENCOUNTER — Encounter: Payer: Medicare Other | Attending: Physical Medicine and Rehabilitation | Admitting: Registered Nurse

## 2023-10-14 ENCOUNTER — Ambulatory Visit
Admission: RE | Admit: 2023-10-14 | Discharge: 2023-10-14 | Disposition: A | Discharge status: Home / Self Care | Payer: Medicare Other | Source: Ambulatory Visit | Attending: Registered Nurse | Admitting: Registered Nurse

## 2023-10-14 ENCOUNTER — Encounter: Payer: Self-pay | Admitting: Registered Nurse

## 2023-10-14 VITALS — BP 142/73 | HR 67 | Ht 70.0 in

## 2023-10-14 DIAGNOSIS — Z5181 Encounter for therapeutic drug level monitoring: Secondary | ICD-10-CM | POA: Insufficient documentation

## 2023-10-14 DIAGNOSIS — M25512 Pain in left shoulder: Secondary | ICD-10-CM | POA: Diagnosis present

## 2023-10-14 DIAGNOSIS — Z9889 Other specified postprocedural states: Secondary | ICD-10-CM | POA: Diagnosis not present

## 2023-10-14 DIAGNOSIS — M5416 Radiculopathy, lumbar region: Secondary | ICD-10-CM | POA: Insufficient documentation

## 2023-10-14 DIAGNOSIS — G894 Chronic pain syndrome: Secondary | ICD-10-CM | POA: Insufficient documentation

## 2023-10-14 DIAGNOSIS — Z79899 Other long term (current) drug therapy: Secondary | ICD-10-CM | POA: Diagnosis present

## 2023-10-14 MED ORDER — TRAMADOL HCL 50 MG PO TABS: ORAL_TABLET | ORAL | 5 refills | Status: DC

## 2023-10-14 NOTE — Progress Notes (Deleted)
Subjective:    Patient ID: Noah Scott, male    DOB: 1936-04-21, 88 y.o.   MRN: [id number]  HPI   Pain Inventory Average Pain {NUMBERS; 0-10:5044} Pain Right Now {NUMBERS; 0-10:5044} My pain is {PAIN DESCRIPTION:21022940}  In the last 24 hours, has pain interfered with the following? General activity {NUMBERS; 0-10:5044} Relation with others {NUMBERS; 0-10:5044} Enjoyment of life {NUMBERS; 0-10:5044} What TIME of day is your pain at its worst? {time of day:24191} Sleep (in general) {BHH GOOD/FAIR/POOR:22877}  Pain is worse with: {ACTIVITIES:21022942} Pain improves with: {PAIN IMPROVES WUJW:11914782} Relief from Meds: {NUMBERS; 0-10:5044}  Family History  Problem Relation Age of Onset  . Heart disease Father        Pt does not know details  . Stroke Neg Hx   . Diabetes Neg Hx    Social History   Socioeconomic History  . Marital status: Widowed    Spouse name: Not on file  . Number of children: 1  . Years of education: 41  . Highest education level: Not on file  Occupational History  . Occupation: retired Geologist, engineering  Tobacco Use  . Smoking status: Former    Current packs/day: 0.00    Types: Cigarettes    Quit date: 03/02/1969    Years since quitting: 54.6  . Smokeless tobacco: Never  Vaping Use  . Vaping status: Never Used  Substance and Sexual Activity  . Alcohol use: Not Currently  . Drug use: No  . Sexual activity: Not on file  Other Topics Concern  . Not on file  Social History Narrative   Lives alone in an apartment on the first floor.  Has one daughter.  Retired Geologist, engineering.  Education: some college.    Social Drivers of Corporate investment banker Strain: Low Risk  (10/17/2022)   Overall Financial Resource Strain (CARDIA)   . Difficulty of Paying Living Expenses: Not hard at all  Food Insecurity: No Food Insecurity (10/17/2022)   Hunger Vital Sign   . Worried About Programme researcher, broadcasting/film/video in the Last Year: Never true   .  Ran Out of Food in the Last Year: Never true  Transportation Needs: No Transportation Needs (10/17/2022)   PRAPARE - Transportation   . Lack of Transportation (Medical): No   . Lack of Transportation (Non-Medical): No  Physical Activity: Insufficiently Active (10/17/2022)   Exercise Vital Sign   . Days of Exercise per Week: 7 days   . Minutes of Exercise per Session: 10 min  Stress: No Stress Concern Present (10/17/2022)   [employer] of Occupational Health - Occupational Stress Questionnaire   . Feeling of Stress : Not at all  Social Connections: Moderately Isolated (10/17/2022)   Social Connection and Isolation Panel [NHANES]   . Frequency of Communication with Friends and Family: More than three times a week   . Frequency of Social Gatherings with Friends and Family: More than three times a week   . Attends Religious Services: Never   . Active Member of Clubs or Organizations: Yes   . Attends Banker Meetings: 1 to 4 times per year   . Marital Status: Widowed   Past Surgical History:  Procedure Laterality Date  . CORONARY STENT INTERVENTION N/A 11/25/2017   Procedure: CORONARY STENT INTERVENTION;  Surgeon: Marykay Lex, MD;  Location: [facility] INVASIVE CV LAB;  Service: Cardiovascular:   m-dRCA 95%  (DES PCI - 0%.  Resolute ONYX DES 2.75 x 18 mm - ~3.0 mm).  Ost-proxRCA 75-65% (DES PCI-0%. 2 overlapping Resolute ONYX DES 3.0 x 26 & 3.0 x 8 mm --> 3.5-3.3 mm)  . CORONARY STENT INTERVENTION  2001   ([place]): PCI OM1  . LEA DOPPLERS  12/12/2017   Normal bilateral ABIs and TBI's.  Marland Kitchen LEFT HEART CATH AND CORONARY ANGIOGRAPHY N/A 11/25/2017   Procedure: LEFT HEART CATH AND CORONARY ANGIOGRAPHY;  Surgeon: Marykay Lex, MD;  Location: [facility] INVASIVE CV LAB;  Service: Cardiovascular:  Ost-proxRCA 75&65%(DES PCI), m-dRCA 95% (DES PCI), dRCA 50%-25% ost rPDA. ostOM1 65% ISR (from 2001).   . LUMBAR LAMINECTOMY/DECOMPRESSION MICRODISCECTOMY Bilateral 06/13/2018   Procedure:  Laminectomy and Foraminotomy - L1-L2 - L2-L3 - L3-L4 - bilateral;  Surgeon: Tia Alert, MD;  Location: [facility] OR;  Service: Neurosurgery;  Laterality: Bilateral;  . TRANSTHORACIC ECHOCARDIOGRAM  11/23/2017   a) 11/2017: Nl LV size & Fxn. EF 60 - 65%.  No RWMA.  GR 1 DD.  Mild RV dilation.; b) 05/2020:([facility]): Normal LV size and function.  EF 60 and 65%.  Normal RV size and function.  Aortic sclerosis with no stenosis.  No AI.  Mildly dilated ascending aorta.  CVP ~5 mm.  . TRANSTHORACIC ECHOCARDIOGRAM  03/31/2021   EF 55 to 60%.  GR 1 DD.  Ascending aorta measured at 41 mm-moderate dilation.  Recommended follow-up with imaging study in 6 months to reassess.   Past Surgical History:  Procedure Laterality Date  . CORONARY STENT INTERVENTION N/A 11/25/2017   Procedure: CORONARY STENT INTERVENTION;  Surgeon: Marykay Lex, MD;  Location: [facility] INVASIVE CV LAB;  Service: Cardiovascular:   m-dRCA 95%  (DES PCI - 0%.  Resolute ONYX DES 2.75 x 18 mm - ~3.0 mm). Ost-proxRCA 75-65% (DES PCI-0%. 2 overlapping Resolute ONYX DES 3.0 x 26 & 3.0 x 8 mm --> 3.5-3.3 mm)  . CORONARY STENT INTERVENTION  2001   ([place]): PCI OM1  . LEA DOPPLERS  12/12/2017   Normal bilateral ABIs and TBI's.  Marland Kitchen LEFT HEART CATH AND CORONARY ANGIOGRAPHY N/A 11/25/2017   Procedure: LEFT HEART CATH AND CORONARY ANGIOGRAPHY;  Surgeon: Marykay Lex, MD;  Location: [facility] INVASIVE CV LAB;  Service: Cardiovascular:  Ost-proxRCA 75&65%(DES PCI), m-dRCA 95% (DES PCI), dRCA 50%-25% ost rPDA. ostOM1 65% ISR (from 2001).   . LUMBAR LAMINECTOMY/DECOMPRESSION MICRODISCECTOMY Bilateral 06/13/2018   Procedure: Laminectomy and Foraminotomy - L1-L2 - L2-L3 - L3-L4 - bilateral;  Surgeon: Tia Alert, MD;  Location: [facility] OR;  Service: Neurosurgery;  Laterality: Bilateral;  . TRANSTHORACIC ECHOCARDIOGRAM  11/23/2017   a) 11/2017: Nl LV size & Fxn. EF 60 - 65%.  No RWMA.  GR 1 DD.  Mild RV dilation.; b) 05/2020:([facility]): Normal LV size and function.  EF 60  and 65%.  Normal RV size and function.  Aortic sclerosis with no stenosis.  No AI.  Mildly dilated ascending aorta.  CVP ~5 mm.  . TRANSTHORACIC ECHOCARDIOGRAM  03/31/2021   EF 55 to 60%.  GR 1 DD.  Ascending aorta measured at 41 mm-moderate dilation.  Recommended follow-up with imaging study in 6 months to reassess.   Past Medical History:  Diagnosis Date  . CAD S/P percutaneous coronary angioplasty 2001   a) 2001 ([place]): PCI OM1 - now with ~65% ISR.;; b) NSTEMI 11/2017 - ost-prox RCA 75-65% (Overlapping ONYX DES 3.0 x 26 & 3.0 x 8 --> 3.5-3.3 mm). m-dRCA 95% (ONYX DES 2.75 x 18 mm -> 3.0 mm). Ost OM1 65% ISR (med Rx). dRCA-OstRDA (50%-25%)  .  Essential (primary) hypertension 11/21/2017  . Hyperlipidemia 11/21/2017  . Iron deficiency anemia due to chronic blood loss 08/08/2021  . Iron malabsorption 08/08/2021  . Neurogenic claudication due to lumbar spinal stenosis 02/06/2018  . NSTEMI (non-ST elevated myocardial infarction) (HCC) 11/21/2017   Multiple RCA lesions - 2 Overlapping DES Ost-Prox RCA & 1 in mid-distal RCA  . Spinal stenosis of lumbar region   . Venous reflux 11/2020   Venous Dopplers 12/15/2020: No DVT or superficial venous thrombosis bilaterally.  Venous reflux noted in bilateral FV and saphenofemoral junctions, R GSV in the thigh and R SFV, left proximal thigh GSV. -->  No invasive options due to deep and superficial venous reflux.   There were no vitals taken for this visit.  Opioid Risk Score:   Fall Risk Score:  `1  Depression screen [facility] 2/9     08/14/2023   12:53 PM 05/13/2023    1:42 PM 10/17/2022   10:10 AM 08/13/2022   10:33 AM 05/02/2018   12:07 PM 02/12/2018    4:54 PM  Depression screen PHQ 2/9  Decreased Interest 0 0 0 0 0 0  Down, Depressed, Hopeless 0 0 0 0 0 0  PHQ - 2 Score 0 0 0 0 0 0  Altered sleeping  1      Tired, decreased energy  1      Change in appetite  0      Feeling bad or failure about yourself   0      Trouble concentrating  0       Moving slowly or fidgety/restless  0      Suicidal thoughts  0      PHQ-9 Score  2        Review of Systems     Objective:   Physical Exam        Assessment & Plan:

## 2023-10-14 NOTE — Progress Notes (Signed)
Subjective:    Patient ID: Noah Scott, male    DOB: [birth year], 88 y.o.   MRN: [id number]  HPI: Noah Scott is a 88 y.o. male who returns for follow up appointment for chronic pain and medication refill. He states his pain is located in his left shoulder, denies any falls and lower back pain. He rates his pain 8. His current exercise regime is walking.  Noah Scott Morphine equivalent is 60.00 MME.   UDS ordered today.     Pain Inventory Average Pain 8 Pain Right Now 8 My pain is aching  In the last 24 hours, has pain interfered with the following? General activity 7 Relation with others 7 Enjoyment of life 6 What TIME of day is your pain at its worst? evening and night Sleep (in general) Poor  Pain is worse with: walking, bending, sitting, and standing Pain improves with: medication and injections Relief from Meds: 3  Family History  Problem Relation Age of Onset   Heart disease Father        Pt does not know details   Stroke Neg Hx    Diabetes Neg Hx    Social History   Socioeconomic History   Marital status: Widowed    Spouse name: Not on file   Number of children: 1   Years of education: 36   Highest education level: Not on file  Occupational History   Occupation: retired Geologist, engineering  Tobacco Use   Smoking status: Former    Current packs/day: 0.00    Types: Cigarettes    Quit date: 03/02/1969    Years since quitting: 54.6   Smokeless tobacco: Never  Vaping Use   Vaping status: Never Used  Substance and Sexual Activity   Alcohol use: Not Currently   Drug use: No   Sexual activity: Not on file  Other Topics Concern   Not on file  Social History Narrative   Lives alone in an apartment on the first floor.  Has one daughter.  Retired Geologist, engineering.  Education: some college.    Social Drivers of Corporate investment banker Strain: Low Risk  (10/17/2022)   Overall Financial Resource Strain (CARDIA)    Difficulty of Paying Living  Expenses: Not hard at all  Food Insecurity: No Food Insecurity (10/17/2022)   Hunger Vital Sign    Worried About Running Out of Food in the Last Year: Never true    Ran Out of Food in the Last Year: Never true  Transportation Needs: No Transportation Needs (10/17/2022)   PRAPARE - Administrator, Civil Service (Medical): No    Lack of Transportation (Non-Medical): No  Physical Activity: Insufficiently Active (10/17/2022)   Exercise Vital Sign    Days of Exercise per Week: 7 days    Minutes of Exercise per Session: 10 min  Stress: No Stress Concern Present (10/17/2022)   [employer] of Occupational Health - Occupational Stress Questionnaire    Feeling of Stress : Not at all  Social Connections: Moderately Isolated (10/17/2022)   Social Connection and Isolation Panel [NHANES]    Frequency of Communication with Friends and Family: More than three times a week    Frequency of Social Gatherings with Friends and Family: More than three times a week    Attends Religious Services: Never    Database administrator or Organizations: Yes    Attends Banker Meetings: 1 to 4 times per year    Marital  Status: Widowed   Past Surgical History:  Procedure Laterality Date   CORONARY STENT INTERVENTION N/A 11/25/2017   Procedure: CORONARY STENT INTERVENTION;  Surgeon: Marykay Lex, MD;  Location: [facility] INVASIVE CV LAB;  Service: Cardiovascular:   m-dRCA 95%  (DES PCI - 0%.  Resolute ONYX DES 2.75 x 18 mm - ~3.0 mm). Ost-proxRCA 75-65% (DES PCI-0%. 2 overlapping Resolute ONYX DES 3.0 x 26 & 3.0 x 8 mm --> 3.5-3.3 mm)   CORONARY STENT INTERVENTION  2001   ([place]): PCI OM1   LEA DOPPLERS  12/12/2017   Normal bilateral ABIs and TBI's.   LEFT HEART CATH AND CORONARY ANGIOGRAPHY N/A 11/25/2017   Procedure: LEFT HEART CATH AND CORONARY ANGIOGRAPHY;  Surgeon: Marykay Lex, MD;  Location: [facility] INVASIVE CV LAB;  Service: Cardiovascular:  Ost-proxRCA 75&65%(DES PCI), m-dRCA 95%  (DES PCI), dRCA 50%-25% ost rPDA. ostOM1 65% ISR (from 2001).    LUMBAR LAMINECTOMY/DECOMPRESSION MICRODISCECTOMY Bilateral 06/13/2018   Procedure: Laminectomy and Foraminotomy - L1-L2 - L2-L3 - L3-L4 - bilateral;  Surgeon: Tia Alert, MD;  Location: [facility] OR;  Service: Neurosurgery;  Laterality: Bilateral;   TRANSTHORACIC ECHOCARDIOGRAM  11/23/2017   a) 11/2017: Nl LV size & Fxn. EF 60 - 65%.  No RWMA.  GR 1 DD.  Mild RV dilation.; b) 05/2020:([facility]): Normal LV size and function.  EF 60 and 65%.  Normal RV size and function.  Aortic sclerosis with no stenosis.  No AI.  Mildly dilated ascending aorta.  CVP ~5 mm.   TRANSTHORACIC ECHOCARDIOGRAM  03/31/2021   EF 55 to 60%.  GR 1 DD.  Ascending aorta measured at 41 mm-moderate dilation.  Recommended follow-up with imaging study in 6 months to reassess.   Past Surgical History:  Procedure Laterality Date   CORONARY STENT INTERVENTION N/A 11/25/2017   Procedure: CORONARY STENT INTERVENTION;  Surgeon: Marykay Lex, MD;  Location: [facility] INVASIVE CV LAB;  Service: Cardiovascular:   m-dRCA 95%  (DES PCI - 0%.  Resolute ONYX DES 2.75 x 18 mm - ~3.0 mm). Ost-proxRCA 75-65% (DES PCI-0%. 2 overlapping Resolute ONYX DES 3.0 x 26 & 3.0 x 8 mm --> 3.5-3.3 mm)   CORONARY STENT INTERVENTION  2001   ([place]): PCI OM1   LEA DOPPLERS  12/12/2017   Normal bilateral ABIs and TBI's.   LEFT HEART CATH AND CORONARY ANGIOGRAPHY N/A 11/25/2017   Procedure: LEFT HEART CATH AND CORONARY ANGIOGRAPHY;  Surgeon: Marykay Lex, MD;  Location: [facility] INVASIVE CV LAB;  Service: Cardiovascular:  Ost-proxRCA 75&65%(DES PCI), m-dRCA 95% (DES PCI), dRCA 50%-25% ost rPDA. ostOM1 65% ISR (from 2001).    LUMBAR LAMINECTOMY/DECOMPRESSION MICRODISCECTOMY Bilateral 06/13/2018   Procedure: Laminectomy and Foraminotomy - L1-L2 - L2-L3 - L3-L4 - bilateral;  Surgeon: Tia Alert, MD;  Location: [facility] OR;  Service: Neurosurgery;  Laterality: Bilateral;   TRANSTHORACIC ECHOCARDIOGRAM   11/23/2017   a) 11/2017: Nl LV size & Fxn. EF 60 - 65%.  No RWMA.  GR 1 DD.  Mild RV dilation.; b) 05/2020:([facility]): Normal LV size and function.  EF 60 and 65%.  Normal RV size and function.  Aortic sclerosis with no stenosis.  No AI.  Mildly dilated ascending aorta.  CVP ~5 mm.   TRANSTHORACIC ECHOCARDIOGRAM  03/31/2021   EF 55 to 60%.  GR 1 DD.  Ascending aorta measured at 41 mm-moderate dilation.  Recommended follow-up with imaging study in 6 months to reassess.   Past Medical History:  Diagnosis Date  CAD S/P percutaneous coronary angioplasty 2001   a) 2001 ([place]): PCI OM1 - now with ~65% ISR.;; b) NSTEMI 11/2017 - ost-prox RCA 75-65% (Overlapping ONYX DES 3.0 x 26 & 3.0 x 8 --> 3.5-3.3 mm). m-dRCA 95% (ONYX DES 2.75 x 18 mm -> 3.0 mm). Ost OM1 65% ISR (med Rx). dRCA-OstRDA (50%-25%)   Essential (primary) hypertension 11/21/2017   Hyperlipidemia 11/21/2017   Iron deficiency anemia due to chronic blood loss 08/08/2021   Iron malabsorption 08/08/2021   Neurogenic claudication due to lumbar spinal stenosis 02/06/2018   NSTEMI (non-ST elevated myocardial infarction) (HCC) 11/21/2017   Multiple RCA lesions - 2 Overlapping DES Ost-Prox RCA & 1 in mid-distal RCA   Spinal stenosis of lumbar region    Venous reflux 11/2020   Venous Dopplers 12/15/2020: No DVT or superficial venous thrombosis bilaterally.  Venous reflux noted in bilateral FV and saphenofemoral junctions, R GSV in the thigh and R SFV, left proximal thigh GSV. -->  No invasive options due to deep and superficial venous reflux.   BP (!) 142/73   Pulse 67   Ht 5\' 10"  (1.778 m)   SpO2 97%   BMI 30.62 kg/m   Opioid Risk Score:   Fall Risk Score:  `1  Depression screen PHQ 2/9     10/14/2023    1:24 PM 08/14/2023   12:53 PM 05/13/2023    1:42 PM 10/17/2022   10:10 AM 08/13/2022   10:33 AM 05/02/2018   12:07 PM 02/12/2018    4:54 PM  Depression screen PHQ 2/9  Decreased Interest 0 0 0 0 0 0 0  Down, Depressed, Hopeless 0  0 0 0 0 0 0  PHQ - 2 Score 0 0 0 0 0 0 0  Altered sleeping   1      Tired, decreased energy   1      Change in appetite   0      Feeling bad or failure about yourself    0      Trouble concentrating   0      Moving slowly or fidgety/restless   0      Suicidal thoughts   0      PHQ-9 Score   2         Review of Systems  Musculoskeletal:  Positive for back pain.  All other systems reviewed and are negative.      Objective:   Physical Exam Vitals and nursing note reviewed.  Constitutional:      Appearance: Normal appearance.  Cardiovascular:     Rate and Rhythm: Normal rate and regular rhythm.     Pulses: Normal pulses.     Heart sounds: Normal heart sounds.  Pulmonary:     Effort: Pulmonary effort is normal.     Breath sounds: Normal breath sounds.  Musculoskeletal:     Comments: Normal Muscle Bulk and Muscle Testing Reveals:  Upper Extremities: Full ROM and Muscle Strength 5/5  Lumbar Paraspinal Tenderness: L-4-L-5 Mainly Right Side  Lower Extremities: Full ROM and Muscle Strength 5/5 Arises from Table slowly Narrow Based  Gait     Skin:    General: Skin is warm and dry.  Neurological:     Mental Status: He is alert and oriented to person, place, and time.  Psychiatric:        Mood and Affect: Mood normal.        Behavior: Behavior normal.         Assessment &  Plan:  Lumbar Radiculitis: S/P Lumbar Laminectomy: Continue HEP as Tolerated. Continue to Monitor.  Acute Pain of Left Shoulder: Denies Falling. Continue HEP as Tolerated. Continue to Monitor.  Chronic Pain Syndrome: Refilled: Tramadol 50 mg 1- 1.5 tablet every 6 hours as needed for pain #180. We will continue the opioid monitoring program, this consists of regular clinic visits, examinations, urine drug screen, pill counts as well as use of [place] Controlled Substance Reporting system. A 12 month History has been reviewed on the [place] Controlled Substance Reporting System on 10/14/2023 F/U  in 4 months has a scheduled appointment with Dr Shearon Stalls

## 2023-10-16 LAB — TOXASSURE SELECT,+ANTIDEPR,UR

## 2023-10-30 ENCOUNTER — Encounter: Payer: Self-pay | Admitting: Physical Therapy

## 2023-11-16 ENCOUNTER — Other Ambulatory Visit: Payer: Self-pay | Admitting: Medical

## 2023-11-25 ENCOUNTER — Ambulatory Visit (INDEPENDENT_AMBULATORY_CARE_PROVIDER_SITE_OTHER): Payer: Medicare Other | Admitting: Medical

## 2023-11-25 VITALS — BP 114/64 | HR 65 | Resp 18 | Ht 70.0 in | Wt 217.0 lb

## 2023-11-25 DIAGNOSIS — L600 Ingrowing nail: Secondary | ICD-10-CM

## 2023-11-25 DIAGNOSIS — L603 Nail dystrophy: Secondary | ICD-10-CM

## 2023-11-25 DIAGNOSIS — R739 Hyperglycemia, unspecified: Secondary | ICD-10-CM

## 2023-11-25 DIAGNOSIS — I10 Essential (primary) hypertension: Secondary | ICD-10-CM

## 2023-11-25 NOTE — Patient Instructions (Signed)
 Ingrown toenail Pain persists in the right great toe. No infection or inflammation, but risk remains if untreated. - Refer to a new podiatrist for evaluation and potential nail removal. - Instructed to monitor for infection signs and contact office if symptoms occur.  Hypertension Hypertension well-controlled with current bp  medications.  Hyperlipidemia On Crestor 20 mg. Cardiologist monitors lipid levels.  General Health Maintenance Regular physical activity supports cardiovascular health and weight management.

## 2023-11-25 NOTE — Progress Notes (Signed)
 Subjective:    Patient ID: Noah Scott, male    DOB: May 31, 1936, 88 y.o.   MRN: [id number]  HPI Discussed the use of AI scribe software for clinical note transcription with the patient, who gave verbal consent to proceed.  History of Present Illness   He presents with painful ingrown toenails on the right great toe.  The pain is localized to the medial side of the right great toe and is described as a 'pinching' sensation that becomes noticeable when the nail grows and rubs against the adjacent toe. This discomfort has persisted since his last visit to the podiatrist two months ago, where only the big toes were addressed with a grinding wheel. He was dissatisfied with the treatment, as the podiatrist did not trim the nails in a manner that alleviated the pain, leaving him with ongoing discomfort. He recalls that the podiatrist spent only six minutes on the procedure and did not remove any part of the nail causing the issue. No signs of infection such as redness, swelling, or discharge, but there is a persistent pinching sensation.  He is currently taking losartan, metoprolol, and Lasix for blood pressure management, which is well-controlled. He is on Crestor 20 mg for high cholesterol. The cardiologist is expected to conduct a metabolic and lipid panel during the upcoming appointment.  He engages in regular physical activity, including climbing stairs daily and attending the gym a couple of days a week.            Review of Systems  Constitutional:  Negative for chills, fatigue and fever.  HENT:  Negative for congestion and ear pain.   Respiratory:  Negative for cough, choking, shortness of breath and wheezing.   Cardiovascular:  Negative for chest pain and palpitations.  Gastrointestinal:  Negative for abdominal pain, blood in stool and diarrhea.  Genitourinary:  Negative for dysuria and frequency.  Musculoskeletal:  Negative for back pain, myalgias and neck stiffness.        Ingrown great toe nails  Skin:  Negative for rash.  Neurological:  Negative for dizziness, syncope, weakness and headaches.  Hematological:  Negative for adenopathy. Does not bruise/bleed easily.  Psychiatric/Behavioral:  Negative for behavioral problems and decreased concentration.     Past Medical History:  Diagnosis Date   CAD S/P percutaneous coronary angioplasty 2001   a) 2001 ([place]): PCI OM1 - now with ~65% ISR.;; b) NSTEMI 11/2017 - ost-prox RCA 75-65% (Overlapping ONYX DES 3.0 x 26 & 3.0 x 8 --> 3.5-3.3 mm). m-dRCA 95% (ONYX DES 2.75 x 18 mm -> 3.0 mm). Ost OM1 65% ISR (med Rx). dRCA-OstRDA (50%-25%)   Essential (primary) hypertension 11/21/2017   Hyperlipidemia 11/21/2017   Iron deficiency anemia due to chronic blood loss 08/08/2021   Iron malabsorption 08/08/2021   Neurogenic claudication due to lumbar spinal stenosis 02/06/2018   NSTEMI (non-ST elevated myocardial infarction) (HCC) 11/21/2017   Multiple RCA lesions - 2 Overlapping DES Ost-Prox RCA & 1 in mid-distal RCA   Spinal stenosis of lumbar region    Venous reflux 11/2020   Venous Dopplers 12/15/2020: No DVT or superficial venous thrombosis bilaterally.  Venous reflux noted in bilateral FV and saphenofemoral junctions, R GSV in the thigh and R SFV, left proximal thigh GSV. -->  No invasive options due to deep and superficial venous reflux.     Social History   Socioeconomic History   Marital status: Widowed    Spouse name: Not on file   Number of children:  1   Years of education: 33   Highest education level: Not on file  Occupational History   Occupation: retired Geologist, engineering  Tobacco Use   Smoking status: Former    Current packs/day: 0.00    Types: Cigarettes    Quit date: 03/02/1969    Years since quitting: 54.7   Smokeless tobacco: Never  Vaping Use   Vaping status: Never Used  Substance and Sexual Activity   Alcohol use: Not Currently   Drug use: No   Sexual activity: Not on file   Other Topics Concern   Not on file  Social History Narrative   Lives alone in an apartment on the first floor.  Has one daughter.  Retired Geologist, engineering.  Education: some college.    Social Drivers of Corporate investment banker Strain: Low Risk  (10/17/2022)   Overall Financial Resource Strain (CARDIA)    Difficulty of Paying Living Expenses: Not hard at all  Food Insecurity: No Food Insecurity (10/17/2022)   Hunger Vital Sign    Worried About Running Out of Food in the Last Year: Never true    Ran Out of Food in the Last Year: Never true  Transportation Needs: No Transportation Needs (10/17/2022)   PRAPARE - Administrator, Civil Service (Medical): No    Lack of Transportation (Non-Medical): No  Physical Activity: Insufficiently Active (10/17/2022)   Exercise Vital Sign    Days of Exercise per Week: 7 days    Minutes of Exercise per Session: 10 min  Stress: No Stress Concern Present (10/17/2022)   [employer] of Occupational Health - Occupational Stress Questionnaire    Feeling of Stress : Not at all  Social Connections: Moderately Isolated (10/17/2022)   Social Connection and Isolation Panel [NHANES]    Frequency of Communication with Friends and Family: More than three times a week    Frequency of Social Gatherings with Friends and Family: More than three times a week    Attends Religious Services: Never    Database administrator or Organizations: Yes    Attends Banker Meetings: 1 to 4 times per year    Marital Status: Widowed  Intimate Partner Violence: Not At Risk (10/17/2022)   Humiliation, Afraid, Rape, and Kick questionnaire    Fear of Current or Ex-Partner: No    Emotionally Abused: No    Physically Abused: No    Sexually Abused: No    Past Surgical History:  Procedure Laterality Date   CORONARY STENT INTERVENTION N/A 11/25/2017   Procedure: CORONARY STENT INTERVENTION;  Surgeon: Marykay Lex, MD;  Location: [facility] INVASIVE CV  LAB;  Service: Cardiovascular:   m-dRCA 95%  (DES PCI - 0%.  Resolute ONYX DES 2.75 x 18 mm - ~3.0 mm). Ost-proxRCA 75-65% (DES PCI-0%. 2 overlapping Resolute ONYX DES 3.0 x 26 & 3.0 x 8 mm --> 3.5-3.3 mm)   CORONARY STENT INTERVENTION  2001   ([place]): PCI OM1   LEA DOPPLERS  12/12/2017   Normal bilateral ABIs and TBI's.   LEFT HEART CATH AND CORONARY ANGIOGRAPHY N/A 11/25/2017   Procedure: LEFT HEART CATH AND CORONARY ANGIOGRAPHY;  Surgeon: Marykay Lex, MD;  Location: [facility] INVASIVE CV LAB;  Service: Cardiovascular:  Ost-proxRCA 75&65%(DES PCI), m-dRCA 95% (DES PCI), dRCA 50%-25% ost rPDA. ostOM1 65% ISR (from 2001).    LUMBAR LAMINECTOMY/DECOMPRESSION MICRODISCECTOMY Bilateral 06/13/2018   Procedure: Laminectomy and Foraminotomy - L1-L2 - L2-L3 - L3-L4 - bilateral;  Surgeon: Yetta Barre,  Kermit Balo, MD;  Location: [facility] OR;  Service: Neurosurgery;  Laterality: Bilateral;   TRANSTHORACIC ECHOCARDIOGRAM  11/23/2017   a) 11/2017: Nl LV size & Fxn. EF 60 - 65%.  No RWMA.  GR 1 DD.  Mild RV dilation.; b) 05/2020:([facility]): Normal LV size and function.  EF 60 and 65%.  Normal RV size and function.  Aortic sclerosis with no stenosis.  No AI.  Mildly dilated ascending aorta.  CVP ~5 mm.   TRANSTHORACIC ECHOCARDIOGRAM  03/31/2021   EF 55 to 60%.  GR 1 DD.  Ascending aorta measured at 41 mm-moderate dilation.  Recommended follow-up with imaging study in 6 months to reassess.    Family History  Problem Relation Age of Onset   Heart disease Father        Pt does not know details   Stroke Neg Hx    Diabetes Neg Hx     Allergies  Allergen Reactions   Plavix [Clopidogrel Bisulfate] Swelling   Brilinta [Ticagrelor] Cough   Enalapril Other (See Comments)    Current Outpatient Medications on File Prior to Visit  Medication Sig Dispense Refill   acetaminophen (TYLENOL) 650 MG CR tablet Take 650 mg by mouth every 8 (eight) hours as needed for pain.     Aromatic Inhalants (VICKS VAPOR INHALER IN) Inhale 1  Inhaler into the lungs as needed (stuffy nose).     Cholecalciferol (D3 EXTRA STRENGTH) 125 MCG (5000 UT) CAPS Take 1 capsule by mouth daily.     cyanocobalamin (VITAMIN B12) 1000 MCG tablet Take 1,000 mcg by mouth daily.     docusate sodium (COLACE) 100 MG capsule Take 100 mg by mouth 2 (two) times daily.     doxazosin (CARDURA) 8 MG tablet Take 1 tablet (8 mg total) by mouth daily. 30 tablet 11   furosemide (LASIX) 20 MG tablet TAKE 2 TABLET EVERY OTHER DAY AND 1 TABLET ON ODD DAYS. MAY TAKE AN ADDITIONAL TABLET FOR WEIGHT GAIN OF 3LBS OVERNIGHT OR INCREASED SWELLING 200 tablet 3   losartan (COZAAR) 50 MG tablet Take 1 tablet (50 mg total) by mouth daily. 90 tablet 3   magnesium oxide (MAG-OX) 400 (240 Mg) MG tablet Take 400 mg by mouth daily.     metoprolol tartrate (LOPRESSOR) 25 MG tablet TAKE 1/2 TABLET(12.5 MG) BY MOUTH TWICE DAILY 30 tablet 3   nitroGLYCERIN (NITROSTAT) 0.6 MG SL tablet Place 0.6 mg under the tongue as needed.     prasugrel (EFFIENT) 5 MG TABS tablet Take 1 tablet (5 mg total) by mouth daily. 90 tablet 3   rosuvastatin (CRESTOR) 20 MG tablet TAKE 1 TABLET(20 MG) BY MOUTH DAILY 90 tablet 3   traMADol (ULTRAM) 50 MG tablet Take 1- 1.5 tablets every 6 hours as needed for pain 180 tablet 5   No current facility-administered medications on file prior to visit.    BP 114/64   Pulse 65   Resp 18   Ht 5\' 10"  (1.778 m)   Wt 217 lb (98.4 kg)   SpO2 97%   BMI 31.14 kg/m        Objective:   Physical Exam  General Mental Status- Alert. General Appearance- Not in acute distress.   Skin General: Color- Normal Color. Moisture- Normal Moisture.  Neck Carotid Arteries- Normal color. Moisture- Normal Moisture. No carotid bruits. No JVD.  Chest and Lung Exam Auscultation: Breath Sounds:-Normal.  Cardiovascular Auscultation:Rythm- Regular. Murmurs & Other Heart Sounds:Auscultation of the heart reveals- No Murmurs.  Abdomen Inspection:-Inspeection  Normal. Palpation/Percussion:Note:No mass. Palpation and Percussion of the abdomen reveal- Non Tender, Non Distended + BS, no rebound or guarding.   Neurologic Cranial Nerve exam:- CN III-XII intact(No nystagmus), symmetric smile. Strength:- 5/5 equal and symmetric strength both upper and lower extremities.   Lower ext- rt great toe. Medial and lateral aspect of both toe mild tender and appears minimal ingrown. No redness, no dc. Left great toe- not ingrown. Nails no longer thick. Ground down from prior podiatrist appt.    Assessment & Plan:   Assessment and Plan    Ingrown toenail Pain persists in the right great toe. No infection or inflammation, but risk remains if untreated. - Refer to a new podiatrist for evaluation and potential nail removal. - Instructed to monitor for infection signs and contact office if symptoms occur.  Hypertension Hypertension well-controlled with current bp  medications.  Hyperlipidemia On Crestor 20 mg. Cardiologist monitors lipid levels.  General Health Maintenance Regular physical activity supports cardiovascular health and weight management.       Follow up in 3 months or sooner if needed.  Esperanza Richters, PA-C

## 2024-01-02 ENCOUNTER — Other Ambulatory Visit: Payer: Self-pay | Admitting: Medical

## 2024-01-03 ENCOUNTER — Other Ambulatory Visit: Payer: Self-pay | Admitting: Medical

## 2024-02-05 ENCOUNTER — Other Ambulatory Visit: Payer: Self-pay | Admitting: Medical

## 2024-02-12 ENCOUNTER — Encounter: Payer: Self-pay | Admitting: Physical Medicine and Rehabilitation

## 2024-02-12 ENCOUNTER — Encounter
Payer: Medicare Other | Attending: Physical Medicine and Rehabilitation | Admitting: Physical Medicine and Rehabilitation

## 2024-02-12 VITALS — BP 128/78 | HR 58 | Ht 70.0 in | Wt 212.8 lb

## 2024-02-12 DIAGNOSIS — M19042 Primary osteoarthritis, left hand: Secondary | ICD-10-CM | POA: Insufficient documentation

## 2024-02-12 DIAGNOSIS — M21612 Bunion of left foot: Secondary | ICD-10-CM | POA: Insufficient documentation

## 2024-02-12 DIAGNOSIS — M7989 Other specified soft tissue disorders: Secondary | ICD-10-CM | POA: Diagnosis present

## 2024-02-12 DIAGNOSIS — Z79899 Other long term (current) drug therapy: Secondary | ICD-10-CM | POA: Diagnosis present

## 2024-02-12 DIAGNOSIS — G894 Chronic pain syndrome: Secondary | ICD-10-CM | POA: Diagnosis present

## 2024-02-12 DIAGNOSIS — M19041 Primary osteoarthritis, right hand: Secondary | ICD-10-CM | POA: Diagnosis present

## 2024-02-12 DIAGNOSIS — Z5181 Encounter for therapeutic drug level monitoring: Secondary | ICD-10-CM | POA: Diagnosis present

## 2024-02-12 DIAGNOSIS — M21611 Bunion of right foot: Secondary | ICD-10-CM | POA: Diagnosis present

## 2024-02-12 NOTE — Patient Instructions (Addendum)
 Continue tramadol  at current dose  Continue being active  I recommend trying voltaren  gel to your hands and isotoner gloves for arthritis pain and swelling - example of the gloves below. You can use voltaren  up to 4 times daily. OK to substitute vics if you feel it works better.      I recommend trying SalonPas patches over the counter to the painful area on your right low back, can use up to twice daily.   I will inform Dr. Lari Pleva you need a new podiatry referral   Follow up with eunice in 6 months and me in 1 year

## 2024-02-12 NOTE — Progress Notes (Signed)
 Subjective:    Patient ID: Noah Scott, male    DOB: 13-Jan-1936, 88 y.o.   MRN: [id number]  HPI  Noah Scott is a 88 y.o. 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   They are presenting to PM&R clinic for follow up related to  treatment of low back pain and bilateral leg pain .   Plan from last visit: Lumbar Radiculitis: S/P Lumbar Laminectomy: Continue HEP as Tolerated. Continue to Monitor.  Acute Pain of Left Shoulder: Denies Falling. Continue HEP as Tolerated. Continue to Monitor.  Chronic Pain Syndrome: Refilled: Tramadol  50 mg 1- 1.5 tablet every 6 hours as needed for pain #180. We will continue the opioid monitoring program, this consists of regular clinic visits, examinations, urine drug screen, pill counts as well as use of [place]  Controlled Substance Reporting system. A 12 month History has been reviewed on the [place]  Controlled Substance Reporting System on 10/14/2023 F/U in 4 months has a scheduled appointment with Dr Dorn Gaskins    Interval Hx:  - Therapies: Goes to the gym a few days a week with a hand cycle; feels it makes his pain better until he stops and has to get off the machine. He says he is totally independent but he has to sit down for a second or two occasionally, which relieves his back pain. He walks on a treadmill at th gym.    - Follow ups: None; he recently got referred to a new podiatrist and was told "I wasn't in the network". When he is seeing his PCP next, he needs another referral.    - Falls: He lives in a third floor  apartment and has to climb steps; has never had a fall. He uses the handrails for support.    - DME: He uses a single point cane at home and in the community.    - Medications: He uses vics vapo rub between his toes for pain and swelling in his feet, and elevates them, which helps.   Taking tramadol  75 mg every 6 hours consistently; he feels good with this regimen "it takes that tinge off".    - Other concerns: He has started getting arthritis in his wrists and hands; causing lots of swelling and pain, also an itching sensation. He says it is the worst at nighttime "because everything goes dead".   With swelling in hands and feet; no fevers, no chills, no rashes; endorses mother had arthritis. No family Hx autoimmune dz.    Pain Inventory Average Pain 7 Pain Right Now 8 My pain is aching and itching  In the last 24 hours, has pain interfered with the following? General activity 5 Relation with others 5 Enjoyment of life 5 What TIME of day is your pain at its worst? morning , daytime, evening, and night Sleep (in general) Poor  Pain is worse with: standing and some activites Pain improves with: therapy/exercise and medication Relief from Meds: 8  Family History  Problem Relation Age of Onset   Heart disease Father        Pt does not know details   Stroke Neg Hx    Diabetes Neg Hx  Social History   Socioeconomic History   Marital status: Widowed    Spouse name: Not on file   Number of children: 1   Years of education: 59   Highest education level: Not on file  Occupational History   Occupation: retired Geologist, engineering  Tobacco Use   Smoking status: Former    Current packs/day: 0.00    Types: Cigarettes    Quit date: 03/02/1969    Years since quitting: 54.9   Smokeless tobacco: Never  Vaping Use   Vaping status: Never Used  Substance and Sexual Activity   Alcohol use: Not Currently   Drug use: No   Sexual activity: Not on file  Other Topics Concern    Not on file  Social History Narrative   Lives alone in an apartment on the first floor.  Has one daughter.  Retired Geologist, engineering.  Education: some college.    Social Drivers of Corporate investment banker Strain: Low Risk  (10/17/2022)   Overall Financial Resource Strain (CARDIA)    Difficulty of Paying Living Expenses: Not hard at all  Food Insecurity: No Food Insecurity (10/17/2022)   Hunger Vital Sign    Worried About Running Out of Food in the Last Year: Never true    Ran Out of Food in the Last Year: Never true  Transportation Needs: No Transportation Needs (10/17/2022)   PRAPARE - Administrator, Civil Service (Medical): No    Lack of Transportation (Non-Medical): No  Physical Activity: Insufficiently Active (10/17/2022)   Exercise Vital Sign    Days of Exercise per Week: 7 days    Minutes of Exercise per Session: 10 min  Stress: No Stress Concern Present (10/17/2022)   [employer] of Occupational Health - Occupational Stress Questionnaire    Feeling of Stress : Not at all  Social Connections: Moderately Isolated (10/17/2022)   Social Connection and Isolation Panel [NHANES]    Frequency of Communication with Friends and Family: More than three times a week    Frequency of Social Gatherings with Friends and Family: More than three times a week    Attends Religious Services: Never    Database administrator or Organizations: Yes    Attends Banker Meetings: 1 to 4 times per year    Marital Status: Widowed   Past Surgical History:  Procedure Laterality Date   CORONARY STENT INTERVENTION N/A 11/25/2017   Procedure: CORONARY STENT INTERVENTION;  Surgeon: Arleen Lacer, MD;  Location: [facility] INVASIVE CV LAB;  Service: Cardiovascular:   m-dRCA 95%  (DES PCI - 0%.  Resolute ONYX DES 2.75 x 18 mm - ~3.0 mm). Ost-proxRCA 75-65% (DES PCI-0%. 2 overlapping Resolute ONYX DES 3.0 x 26 & 3.0 x 8 mm --> 3.5-3.3 mm)   CORONARY STENT INTERVENTION  2001    ([place]): PCI OM1   LEA DOPPLERS  12/12/2017   Normal bilateral ABIs and TBI's.   LEFT HEART CATH AND CORONARY ANGIOGRAPHY N/A 11/25/2017   Procedure: LEFT HEART CATH AND CORONARY ANGIOGRAPHY;  Surgeon: Arleen Lacer, MD;  Location: [facility] INVASIVE CV LAB;  Service: Cardiovascular:  Ost-proxRCA 75&65%(DES PCI), m-dRCA 95% (DES PCI), dRCA 50%-25% ost rPDA. ostOM1 65% ISR (from 2001).    LUMBAR LAMINECTOMY/DECOMPRESSION MICRODISCECTOMY Bilateral 06/13/2018   Procedure: Laminectomy and Foraminotomy - L1-L2 - L2-L3 - L3-L4 - bilateral;  Surgeon: Isadora Mar, MD;  Location: [facility] OR;  Service: Neurosurgery;  Laterality: Bilateral;   TRANSTHORACIC ECHOCARDIOGRAM  11/23/2017  a) 11/2017: Nl LV size & Fxn. EF 60 - 65%.  No RWMA.  GR 1 DD.  Mild RV dilation.; b) 05/2020:([facility]): Normal LV size and function.  EF 60 and 65%.  Normal RV size and function.  Aortic sclerosis with no stenosis.  No AI.  Mildly dilated ascending aorta.  CVP ~5 mm.   TRANSTHORACIC ECHOCARDIOGRAM  03/31/2021   EF 55 to 60%.  GR 1 DD.  Ascending aorta measured at 41 mm-moderate dilation.  Recommended follow-up with imaging study in 6 months to reassess.   Past Surgical History:  Procedure Laterality Date   CORONARY STENT INTERVENTION N/A 11/25/2017   Procedure: CORONARY STENT INTERVENTION;  Surgeon: Arleen Lacer, MD;  Location: [facility] INVASIVE CV LAB;  Service: Cardiovascular:   m-dRCA 95%  (DES PCI - 0%.  Resolute ONYX DES 2.75 x 18 mm - ~3.0 mm). Ost-proxRCA 75-65% (DES PCI-0%. 2 overlapping Resolute ONYX DES 3.0 x 26 & 3.0 x 8 mm --> 3.5-3.3 mm)   CORONARY STENT INTERVENTION  2001   ([place]): PCI OM1   LEA DOPPLERS  12/12/2017   Normal bilateral ABIs and TBI's.   LEFT HEART CATH AND CORONARY ANGIOGRAPHY N/A 11/25/2017   Procedure: LEFT HEART CATH AND CORONARY ANGIOGRAPHY;  Surgeon: Arleen Lacer, MD;  Location: [facility] INVASIVE CV LAB;  Service: Cardiovascular:  Ost-proxRCA 75&65%(DES PCI), m-dRCA 95% (DES PCI), dRCA  50%-25% ost rPDA. ostOM1 65% ISR (from 2001).    LUMBAR LAMINECTOMY/DECOMPRESSION MICRODISCECTOMY Bilateral 06/13/2018   Procedure: Laminectomy and Foraminotomy - L1-L2 - L2-L3 - L3-L4 - bilateral;  Surgeon: Isadora Mar, MD;  Location: [facility] OR;  Service: Neurosurgery;  Laterality: Bilateral;   TRANSTHORACIC ECHOCARDIOGRAM  11/23/2017   a) 11/2017: Nl LV size & Fxn. EF 60 - 65%.  No RWMA.  GR 1 DD.  Mild RV dilation.; b) 05/2020:([facility]): Normal LV size and function.  EF 60 and 65%.  Normal RV size and function.  Aortic sclerosis with no stenosis.  No AI.  Mildly dilated ascending aorta.  CVP ~5 mm.   TRANSTHORACIC ECHOCARDIOGRAM  03/31/2021   EF 55 to 60%.  GR 1 DD.  Ascending aorta measured at 41 mm-moderate dilation.  Recommended follow-up with imaging study in 6 months to reassess.   Past Medical History:  Diagnosis Date   CAD S/P percutaneous coronary angioplasty 2001   a) 2001 ([place]): PCI OM1 - now with ~65% ISR.;; b) NSTEMI 11/2017 - ost-prox RCA 75-65% (Overlapping ONYX DES 3.0 x 26 & 3.0 x 8 --> 3.5-3.3 mm). m-dRCA 95% (ONYX DES 2.75 x 18 mm -> 3.0 mm). Ost OM1 65% ISR (med Rx). dRCA-OstRDA (50%-25%)   Essential (primary) hypertension 11/21/2017   Hyperlipidemia 11/21/2017   Iron deficiency anemia due to chronic blood loss 08/08/2021   Iron malabsorption 08/08/2021   Neurogenic claudication due to lumbar spinal stenosis 02/06/2018   NSTEMI (non-ST elevated myocardial infarction) (HCC) 11/21/2017   Multiple RCA lesions - 2 Overlapping DES Ost-Prox RCA & 1 in mid-distal RCA   Spinal stenosis of lumbar region    Venous reflux 11/2020   Venous Dopplers 12/15/2020: No DVT or superficial venous thrombosis bilaterally.  Venous reflux noted in bilateral FV and saphenofemoral junctions, R GSV in the thigh and R SFV, left proximal thigh GSV. -->  No invasive options due to deep and superficial venous reflux.   BP (!) 156/81   Pulse (!) 58   Ht 5\' 10"  (1.778 m)   Wt 212 lb 12.8 oz (96.5  kg)    SpO2 96%   BMI 30.53 kg/m   Opioid Risk Score:   Fall Risk Score:  `1  Depression screen PHQ 2/9     02/12/2024    1:01 PM 10/14/2023    1:24 PM 08/14/2023   12:53 PM 05/13/2023    1:42 PM 10/17/2022   10:10 AM 08/13/2022   10:33 AM 05/02/2018   12:07 PM  Depression screen PHQ 2/9  Decreased Interest 0 0 0 0 0 0 0  Down, Depressed, Hopeless 0 0 0 0 0 0 0  PHQ - 2 Score 0 0 0 0 0 0 0  Altered sleeping    1     Tired, decreased energy    1     Change in appetite    0     Feeling bad or failure about yourself     0     Trouble concentrating    0     Moving slowly or fidgety/restless    0     Suicidal thoughts    0     PHQ-9 Score    2        Review of Systems  Musculoskeletal:  Positive for arthralgias, back pain and joint swelling.  All other systems reviewed and are negative.      Objective:   Physical Exam   Physical Exam Constitution: Appropriate appearance for age. No apparent distress  Resp: No respiratory distress. No accessory muscle usage. on RA and CTAB Cardio: Well perfused appearance. +2 b/l peripheral edema to the mid-shin, no compression socks.   Abdomen: Nondistended. Nontender.   Psych: Appropriate mood and affect. Neuro: AAOx4. No apparent cognitive deficits    Neurologic Exam:   Sensory exam: revealed normal sensation in all dermatomal regions in bilateral lower extremities  Motor exam: strength 5/5 throughout bilateral legs Coordination: Fine motor coordination was normal.   Gait: +antalgic gait, stiff but stable; single point cane in R hand   MSK: Back -no apparent deformity.  + TTP R lateral parapsinals around L5/S1 only; No tenderness to palpation throughout, PSIS, SI joints, or trochanteric bursa.           Assessment & Plan:   ESHAWN COOR is a 88 y.o. year old male  who  has a past medical history of CAD S/P percutaneous coronary angioplasty (2001), Essential (primary) hypertension (11/21/2017), Hyperlipidemia (11/21/2017), Iron  deficiency anemia due to chronic blood loss (08/08/2021), Iron malabsorption (08/08/2021), Neurogenic claudication due to lumbar spinal stenosis (02/06/2018), NSTEMI (non-ST elevated myocardial infarction) (HCC) (11/21/2017), Spinal stenosis of lumbar region, and Venous reflux (11/2020).    They are presenting to PM&R clinic for follow up related to  treatment of low back pain and bilateral leg pain .   Chronic pain syndrome Encounter for medication monitoring Encounter for long-term current use of medication -     Drug Tox Monitor 1 w/Conf, Oral Fld -     Drug Tox Alc Metab w/Con, Oral Fld  Continue tramadol  at current dose  I recommend trying SalonPas patches over the counter to the painful area on your right low back, can use up to twice daily.   Follow up with eunice in 6 months and me in 1 year  Arthritis of both hands Bilateral hand swelling I recommend trying voltaren  gel to your hands and isotoner gloves for arthritis pain and swelling  . You can use voltaren  up to 4 times daily. OK to substitute vics if you feel it  works better.  Bilateral bunions    I will inform Dr. Lari Pleva you need a new podiatry referral    Discussed cane ergonomics

## 2024-02-16 LAB — DRUG TOX MONITOR 1 W/CONF, ORAL FLD

## 2024-02-16 LAB — DRUG TOX ALC METAB W/CON, ORAL FLD: Alcohol Metabolite: NEGATIVE ng/mL (ref <25)

## 2024-02-25 ENCOUNTER — Ambulatory Visit (INDEPENDENT_AMBULATORY_CARE_PROVIDER_SITE_OTHER): Admitting: Medical

## 2024-02-25 VITALS — BP 122/72 | HR 63 | Resp 18 | Ht 70.0 in | Wt 210.0 lb

## 2024-02-25 DIAGNOSIS — E785 Hyperlipidemia, unspecified: Secondary | ICD-10-CM | POA: Diagnosis not present

## 2024-02-25 DIAGNOSIS — L600 Ingrowing nail: Secondary | ICD-10-CM | POA: Diagnosis not present

## 2024-02-25 DIAGNOSIS — M2010 Hallux valgus (acquired), unspecified foot: Secondary | ICD-10-CM

## 2024-02-25 DIAGNOSIS — G894 Chronic pain syndrome: Secondary | ICD-10-CM

## 2024-02-25 DIAGNOSIS — I10 Essential (primary) hypertension: Secondary | ICD-10-CM | POA: Diagnosis not present

## 2024-02-25 NOTE — Patient Instructions (Signed)
 ngrown bilateral  toenail with hallux valgus both sides as well Chronic right and left great toe pain with hallux valgus and ingrown toenail, no infection. - Submit new in-network podiatrist referral. - Coordinate expedited appointment within weeks.  Low back pain Chronic low back pain managed with tramadol .  Osteoarthritis Osteoarthritis in wrist and possibly left shoulder with pain and limited motion. -tramadol  and tylenol  for pain.  Diastolic dysfunction Diastolic dysfunction with EF 55-60%, no heart failure signs, stable weight, recent weight loss. - Monitor for edema, restart diuretics if swelling recurs. - Discuss fluid management and diuretic use with cardiologist.  Hypertension Blood pressure controlled with losartan  and metoprolol .  Hyperlipidemia On Crestor , lipid panel scheduled, no fasting required.  General Health Maintenance Prefers flu vaccinations through healthcare providers. - Administer flu vaccination in fall.  Follow up jan 2026 or sooner if needed

## 2024-02-25 NOTE — Progress Notes (Signed)
 Subjective:    Patient ID: Noah Scott, male    DOB: 07/08/1936, 88 y.o.   MRN: [id number]  HPI  Noah Scott is an 88 year old male with hallux valgus and ingrown toenails who presents with persistent toe pain.  He experiences persistent pain in the medial and lateral aspects of his right great toe, ongoing for several months, associated with ingrown toenails and hallux valgus. He uses Vicks VapoRub between his toes at night, which he finds helpful in preventing itchiness and breakouts. He has been waiting for a podiatry referral for three months, but previous attempts were unsuccessful due to insurance issues.  He has a history of low back pain for which he takes tramadol , one and a half tablets every six hours. He also uses Tylenol  Arthritis for pain management, particularly for osteoarthritis affecting his wrist and shoulder. He experiences pain and swelling in his wrist, which he monitors by checking the fit of his fingers.  He is currently on losartan  50 mg and metoprolol  25 mg (half a tablet twice a day) for blood pressure management. He is also taking Crestor  20 mg daily for cholesterol management. He has an upcoming appointment with his cardiologist, where he expects to have his lipid panel checked.  He has been off his diuretic for a few days to monitor leg swelling, which has not recurred. His weight has decreased from 217 pounds to 210 pounds over the past month. He engages in regular exercise and elevates his legs twice a day to manage potential edema.         Review of Systems  Constitutional:  Negative for fatigue.  HENT:  Negative for congestion, ear pain and hearing loss.   Respiratory:  Negative for cough, shortness of breath and wheezing.   Cardiovascular:  Negative for chest pain and palpitations.  Gastrointestinal:  Negative for abdominal pain, blood in stool, constipation and nausea.  Genitourinary:  Negative for dysuria, frequency, hematuria and urgency.   Musculoskeletal:  Negative for back pain and myalgias.       See hpi  Skin:  Negative for rash.  Neurological:  Negative for dizziness, syncope, weakness and light-headedness.  Hematological:  Negative for adenopathy. Does not bruise/bleed easily.  Psychiatric/Behavioral:  Negative for behavioral problems, decreased concentration and dysphoric mood.     Past Medical History:  Diagnosis Date   CAD S/P percutaneous coronary angioplasty 2001   a) 2001 ([place]): PCI OM1 - now with ~65% ISR.;; b) NSTEMI 11/2017 - ost-prox RCA 75-65% (Overlapping ONYX DES 3.0 x 26 & 3.0 x 8 --> 3.5-3.3 mm). m-dRCA 95% (ONYX DES 2.75 x 18 mm -> 3.0 mm). Ost OM1 65% ISR (med Rx). dRCA-OstRDA (50%-25%)   Essential (primary) hypertension 11/21/2017   Hyperlipidemia 11/21/2017   Iron deficiency anemia due to chronic blood loss 08/08/2021   Iron malabsorption 08/08/2021   Neurogenic claudication due to lumbar spinal stenosis 02/06/2018   NSTEMI (non-ST elevated myocardial infarction) (HCC) 11/21/2017   Multiple RCA lesions - 2 Overlapping DES Ost-Prox RCA & 1 in mid-distal RCA   Spinal stenosis of lumbar region    Venous reflux 11/2020   Venous Dopplers 12/15/2020: No DVT or superficial venous thrombosis bilaterally.  Venous reflux noted in bilateral FV and saphenofemoral junctions, R GSV in the thigh and R SFV, left proximal thigh GSV. -->  No invasive options due to deep and superficial venous reflux.     Social History   Socioeconomic History   Marital status: Widowed  Spouse name: Not on file   Number of children: 1   Years of education: 46   Highest education level: Not on file  Occupational History   Occupation: retired Geologist, engineering  Tobacco Use   Smoking status: Former    Current packs/day: 0.00    Types: Cigarettes    Quit date: 03/02/1969    Years since quitting: 55.0   Smokeless tobacco: Never  Vaping Use   Vaping status: Never Used  Substance and Sexual Activity   Alcohol  use: Not Currently   Drug use: No   Sexual activity: Not on file  Other Topics Concern   Not on file  Social History Narrative   Lives alone in an apartment on the first floor.  Has one daughter.  Retired Geologist, engineering.  Education: some college.    Social Drivers of Corporate investment banker Strain: Low Risk  (10/17/2022)   Overall Financial Resource Strain (CARDIA)    Difficulty of Paying Living Expenses: Not hard at all  Food Insecurity: No Food Insecurity (10/17/2022)   Hunger Vital Sign    Worried About Running Out of Food in the Last Year: Never true    Ran Out of Food in the Last Year: Never true  Transportation Needs: No Transportation Needs (10/17/2022)   PRAPARE - Administrator, Civil Service (Medical): No    Lack of Transportation (Non-Medical): No  Physical Activity: Insufficiently Active (10/17/2022)   Exercise Vital Sign    Days of Exercise per Week: 7 days    Minutes of Exercise per Session: 10 min  Stress: No Stress Concern Present (10/17/2022)   [employer] of Occupational Health - Occupational Stress Questionnaire    Feeling of Stress : Not at all  Social Connections: Moderately Isolated (10/17/2022)   Social Connection and Isolation Panel [NHANES]    Frequency of Communication with Friends and Family: More than three times a week    Frequency of Social Gatherings with Friends and Family: More than three times a week    Attends Religious Services: Never    Database administrator or Organizations: Yes    Attends Banker Meetings: 1 to 4 times per year    Marital Status: Widowed  Intimate Partner Violence: Not At Risk (10/17/2022)   Humiliation, Afraid, Rape, and Kick questionnaire    Fear of Current or Ex-Partner: No    Emotionally Abused: No    Physically Abused: No    Sexually Abused: No    Past Surgical History:  Procedure Laterality Date   CORONARY STENT INTERVENTION N/A 11/25/2017   Procedure: CORONARY STENT  INTERVENTION;  Surgeon: Arleen Lacer, MD;  Location: [facility] INVASIVE CV LAB;  Service: Cardiovascular:   m-dRCA 95%  (DES PCI - 0%.  Resolute ONYX DES 2.75 x 18 mm - ~3.0 mm). Ost-proxRCA 75-65% (DES PCI-0%. 2 overlapping Resolute ONYX DES 3.0 x 26 & 3.0 x 8 mm --> 3.5-3.3 mm)   CORONARY STENT INTERVENTION  2001   ([place]): PCI OM1   LEA DOPPLERS  12/12/2017   Normal bilateral ABIs and TBI's.   LEFT HEART CATH AND CORONARY ANGIOGRAPHY N/A 11/25/2017   Procedure: LEFT HEART CATH AND CORONARY ANGIOGRAPHY;  Surgeon: Arleen Lacer, MD;  Location: [facility] INVASIVE CV LAB;  Service: Cardiovascular:  Ost-proxRCA 75&65%(DES PCI), m-dRCA 95% (DES PCI), dRCA 50%-25% ost rPDA. ostOM1 65% ISR (from 2001).    LUMBAR LAMINECTOMY/DECOMPRESSION MICRODISCECTOMY Bilateral 06/13/2018   Procedure: Laminectomy and Foraminotomy -  L1-L2 - L2-L3 - L3-L4 - bilateral;  Surgeon: Isadora Mar, MD;  Location: [facility] OR;  Service: Neurosurgery;  Laterality: Bilateral;   TRANSTHORACIC ECHOCARDIOGRAM  11/23/2017   a) 11/2017: Nl LV size & Fxn. EF 60 - 65%.  No RWMA.  GR 1 DD.  Mild RV dilation.; b) 05/2020:([facility]): Normal LV size and function.  EF 60 and 65%.  Normal RV size and function.  Aortic sclerosis with no stenosis.  No AI.  Mildly dilated ascending aorta.  CVP ~5 mm.   TRANSTHORACIC ECHOCARDIOGRAM  03/31/2021   EF 55 to 60%.  GR 1 DD.  Ascending aorta measured at 41 mm-moderate dilation.  Recommended follow-up with imaging study in 6 months to reassess.    Family History  Problem Relation Age of Onset   Heart disease Father        Pt does not know details   Stroke Neg Hx    Diabetes Neg Hx     Allergies  Allergen Reactions   Plavix  [Clopidogrel  Bisulfate] Swelling   Brilinta  [Ticagrelor ] Cough   Enalapril Other (See Comments)    Current Outpatient Medications on File Prior to Visit  Medication Sig Dispense Refill   acetaminophen  (TYLENOL ) 650 MG CR tablet Take 650 mg by mouth every 8 (eight) hours as needed  for pain.     Aromatic Inhalants (VICKS VAPOR INHALER IN) Inhale 1 Inhaler into the lungs as needed (stuffy nose).     Cholecalciferol (D3 EXTRA STRENGTH) 125 MCG (5000 UT) CAPS Take 1 capsule by mouth daily.     cyanocobalamin (VITAMIN B12) 1000 MCG tablet Take 1,000 mcg by mouth daily.     docusate sodium (COLACE) 100 MG capsule Take 100 mg by mouth 2 (two) times daily.     doxazosin  (CARDURA ) 8 MG tablet Take 1 tablet (8 mg total) by mouth daily. 30 tablet 11   furosemide  (LASIX ) 20 MG tablet TAKE 2 TABLETS EVERY OTHER DAY AND 1 TABLET ON ODD DAYS. MAY TAKE AN ADDITIONAL TABLET FOR WEIGHT GAIN OF 3 LBS OVERNIGHT OR INCREASED SWELLING 200 tablet 3   losartan  (COZAAR ) 50 MG tablet Take 1 tablet (50 mg total) by mouth daily. 90 tablet 3   magnesium oxide (MAG-OX) 400 (240 Mg) MG tablet Take 400 mg by mouth daily.     metoprolol  tartrate (LOPRESSOR ) 25 MG tablet TAKE 1/2 TABLET(12.5 MG) BY MOUTH TWICE DAILY 30 tablet 3   nitroGLYCERIN  (NITROSTAT ) 0.6 MG SL tablet Place 0.6 mg under the tongue as needed.     prasugrel  (EFFIENT ) 5 MG TABS tablet Take 1 tablet (5 mg total) by mouth daily. 90 tablet 3   rosuvastatin  (CRESTOR ) 20 MG tablet TAKE 1 TABLET(20 MG) BY MOUTH DAILY 90 tablet 3   traMADol  (ULTRAM ) 50 MG tablet Take 1- 1.5 tablets every 6 hours as needed for pain 180 tablet 5   No current facility-administered medications on file prior to visit.    BP 122/72   Pulse 63   Resp 18   Ht 5\' 10"  (1.778 m)   Wt 210 lb (95.3 kg)   SpO2 95%   BMI 30.13 kg/m        Objective:   Physical Exam  General Mental Status- Alert. General Appearance- Not in acute distress.   Skin General: Color- Normal Color. Moisture- Normal Moisture.  Neck Carotid Arteries- Normal color. Moisture- Normal Moisture. No carotid bruits. No JVD.  Chest and Lung Exam Auscultation: Breath Sounds:-CTA  Cardiovascular Auscultation:Rythm- RRR Murmurs & Other Heart  Sounds:Auscultation of the heart reveals- No  Murmurs.  Abdomen Inspection:-Inspeection Normal. Palpation/Percussion:Note:No mass. Palpation and Percussion of the abdomen reveal- Non Tender, Non Distended + BS, no rebound or guarding.   Neurologic Cranial Nerve exam:- CN III-XII intact(No nystagmus), symmetric smile. Strength:- 5/5 equal and symmetric strength both upper and lower extremities.   Rt foot and left foot Right great toe with hallux valgus, slightly ingrown, not infected.Left foot-great toe with hallux valgus, slightly ingrown(more tender left side than rt side)    Assessment & Plan:   Patient Instructions  ngrown bilateral  toenail with hallux valgus both sides as well Chronic right and left great toe pain with hallux valgus and ingrown toenail, no infection. - Submit new in-network podiatrist referral. - Coordinate expedited appointment within weeks.  Low back pain Chronic low back pain managed with tramadol .  Osteoarthritis Osteoarthritis in wrist and possibly left shoulder with pain and limited motion. -tramadol  and tylenol  for pain.  Diastolic dysfunction Diastolic dysfunction with EF 55-60%, no heart failure signs, stable weight, recent weight loss. - Monitor for edema, restart diuretics if swelling recurs. - Discuss fluid management and diuretic use with cardiologist.  Hypertension Blood pressure controlled with losartan  and metoprolol .  Hyperlipidemia On Crestor , lipid panel scheduled, no fasting required.  General Health Maintenance Prefers flu vaccinations through healthcare providers. - Administer flu vaccination in fall.  Follow up jan 2026 or sooner if needed   [employer], PA-C

## 2024-03-04 ENCOUNTER — Inpatient Hospital Stay (HOSPITAL_BASED_OUTPATIENT_CLINIC_OR_DEPARTMENT_OTHER): Payer: Medicare Other | Admitting: Family

## 2024-03-04 ENCOUNTER — Inpatient Hospital Stay: Payer: Medicare Other | Attending: Hematology & Oncology

## 2024-03-04 ENCOUNTER — Encounter: Payer: Self-pay | Admitting: Family

## 2024-03-04 VITALS — BP 153/80 | HR 53 | Temp 98.1°F | Resp 19 | Ht 70.0 in | Wt 208.0 lb

## 2024-03-04 DIAGNOSIS — D500 Iron deficiency anemia secondary to blood loss (chronic): Secondary | ICD-10-CM | POA: Diagnosis not present

## 2024-03-04 DIAGNOSIS — D631 Anemia in chronic kidney disease: Secondary | ICD-10-CM

## 2024-03-04 DIAGNOSIS — K909 Intestinal malabsorption, unspecified: Secondary | ICD-10-CM | POA: Diagnosis not present

## 2024-03-04 DIAGNOSIS — D509 Iron deficiency anemia, unspecified: Secondary | ICD-10-CM | POA: Diagnosis present

## 2024-03-04 LAB — CMP (CANCER CENTER ONLY)
ALT: 18 U/L (ref 0–44)
AST: 13 U/L — ABNORMAL LOW (ref 15–41)
Albumin: 4.1 g/dL (ref 3.5–5.0)
Alkaline Phosphatase: 67 U/L (ref 38–126)
Anion gap: 8 (ref 5–15)
BUN: 19 mg/dL (ref 8–23)
CO2: 30 mmol/L (ref 22–32)
Calcium: 9.1 mg/dL (ref 8.9–10.3)
Chloride: 107 mmol/L (ref 98–111)
Creatinine: 1.1 mg/dL (ref 0.61–1.24)
GFR, Estimated: >60 mL/min (ref >60)
Glucose, Bld: 102 mg/dL — ABNORMAL HIGH (ref 70–99)
Potassium: 4.2 mmol/L (ref 3.5–5.1)
Sodium: 145 mmol/L (ref 135–145)
Total Bilirubin: 0.5 mg/dL (ref 0.0–1.2)
Total Protein: 6.7 g/dL (ref 6.5–8.1)

## 2024-03-04 LAB — RETIC PANEL
Immature Retic Fract: 9.5 % (ref 2.3–15.9)
RBC.: 3.9 MIL/uL — ABNORMAL LOW (ref 4.22–5.81)
Retic Count, Absolute: 54.2 10*3/uL (ref 19.0–186.0)
Retic Ct Pct: 1.4 % (ref 0.4–3.1)
Reticulocyte Hemoglobin: 34.1 pg (ref >27.9)

## 2024-03-04 LAB — CBC WITH DIFFERENTIAL (CANCER CENTER ONLY)
Abs Immature Granulocytes: 0.02 10*3/uL (ref 0.00–0.07)
Basophils Absolute: 0 10*3/uL (ref 0.0–0.1)
Basophils Relative: 0 %
Eosinophils Absolute: 0.1 10*3/uL (ref 0.0–0.5)
Eosinophils Relative: 2 %
HCT: 36.2 % — ABNORMAL LOW (ref 39.0–52.0)
Hemoglobin: 11.9 g/dL — ABNORMAL LOW (ref 13.0–17.0)
Immature Granulocytes: 0 %
Lymphocytes Relative: 17 %
Lymphs Abs: 1.1 10*3/uL (ref 0.7–4.0)
MCH: 30.4 pg (ref 26.0–34.0)
MCHC: 32.9 g/dL (ref 30.0–36.0)
MCV: 92.6 fL (ref 80.0–100.0)
Monocytes Absolute: 0.6 10*3/uL (ref 0.1–1.0)
Monocytes Relative: 10 %
Neutro Abs: 4.5 10*3/uL (ref 1.7–7.7)
Neutrophils Relative %: 71 %
Platelet Count: 210 10*3/uL (ref 150–400)
RBC: 3.91 MIL/uL — ABNORMAL LOW (ref 4.22–5.81)
RDW: 14.4 % (ref 11.5–15.5)
WBC Count: 6.3 10*3/uL (ref 4.0–10.5)
nRBC: 0 % (ref 0.0–0.2)

## 2024-03-04 LAB — FERRITIN: Ferritin: 1145 ng/mL — ABNORMAL HIGH (ref 24–336)

## 2024-03-04 NOTE — Progress Notes (Signed)
 Hematology and Oncology Follow Up Visit  Noah Scott [id number] 18-Jan-1936 88 y.o. 03/04/2024   Principle Diagnosis:  Iron deficiency anemia   Current Therapy:        IV iron as indicated    Interim History:  Noah Scott is here today for follow-up. He is doing quite well staying busy helping his daughter around her house fixing things. He also enjoys cooking and keeping up his own place.  No issues with blood loss that he has noted.  No abnormal bruising, no petechiae.  No fever, chills, n/v, cough, rash, dizziness, SOB, chest pain, palpitations, abdominal pain or changes in bowel or bladder habits at this time.  No swelling, tenderness, numbness or tingling in his extremities.  No falls or syncope reported.  Appetite and hydration have been good. Weight is stable at 208 lbs.   ECOG Performance Status: 1 - Symptomatic but completely ambulatory  Medications:  Allergies as of 03/04/2024       Reactions   Plavix  [clopidogrel  Bisulfate] Swelling   Brilinta  [ticagrelor ] Cough   Enalapril Other (See Comments)        Medication List        Accurate as of March 04, 2024  2:05 PM. If you have any questions, ask your nurse or doctor.          acetaminophen  650 MG CR tablet Commonly known as: TYLENOL  Take 650 mg by mouth every 8 (eight) hours as needed for pain.   cyanocobalamin 1000 MCG tablet Commonly known as: VITAMIN B12 Take 1,000 mcg by mouth daily.   D3 Extra Strength 125 MCG (5000 UT) Caps Take 1 capsule by mouth daily.   docusate sodium 100 MG capsule Commonly known as: COLACE Take 100 mg by mouth 2 (two) times daily.   doxazosin  8 MG tablet Commonly known as: CARDURA  Take 1 tablet (8 mg total) by mouth daily.   furosemide  20 MG tablet Commonly known as: LASIX  TAKE 2 TABLETS EVERY OTHER DAY AND 1 TABLET ON ODD DAYS. MAY TAKE AN ADDITIONAL TABLET FOR WEIGHT GAIN OF 3 LBS OVERNIGHT OR INCREASED SWELLING   losartan  50 MG tablet Commonly known as:  COZAAR  Take 1 tablet (50 mg total) by mouth daily.   magnesium oxide 400 (240 Mg) MG tablet Commonly known as: MAG-OX Take 400 mg by mouth daily.   metoprolol  tartrate 25 MG tablet Commonly known as: LOPRESSOR  TAKE 1/2 TABLET(12.5 MG) BY MOUTH TWICE DAILY   nitroGLYCERIN  0.6 MG SL tablet Commonly known as: NITROSTAT  Place 0.6 mg under the tongue as needed.   prasugrel  5 MG Tabs tablet Commonly known as: EFFIENT  Take 1 tablet (5 mg total) by mouth daily.   rosuvastatin  20 MG tablet Commonly known as: CRESTOR  TAKE 1 TABLET(20 MG) BY MOUTH DAILY   traMADol  50 MG tablet Commonly known as: ULTRAM  Take 1- 1.5 tablets every 6 hours as needed for pain   VICKS VAPOR INHALER IN Inhale 1 Inhaler into the lungs as needed (stuffy nose).        Allergies:  Allergies  Allergen Reactions   Plavix  [Clopidogrel  Bisulfate] Swelling   Brilinta  [Ticagrelor ] Cough   Enalapril Other (See Comments)    Past Medical History, Surgical history, Social history, and Family History were reviewed and updated.  Review of Systems: All other 10 point review of systems is negative.   Physical Exam:  vitals were not taken for this visit.   Wt Readings from Last 3 Encounters:  02/25/24 210 lb (95.3 kg)  02/12/24 212 lb 12.8 oz (96.5 kg)  11/25/23 217 lb (98.4 kg)    Ocular: Sclerae unicteric, pupils equal, round and reactive to light Ear-nose-throat: Oropharynx clear, dentition fair Lymphatic: No cervical or supraclavicular adenopathy Lungs no rales or rhonchi, good excursion bilaterally Heart regular rate and rhythm, no murmur appreciated Abd soft, nontender, positive bowel sounds MSK no focal spinal tenderness, no joint edema Neuro: non-focal, well-oriented, appropriate affect Breasts: Deferred   Lab Results  Component Value Date   WBC 6.3 03/04/2024   HGB 11.9 (L) 03/04/2024   HCT 36.2 (L) 03/04/2024   MCV 92.6 03/04/2024   PLT 210 03/04/2024   Lab Results  Component Value  Date   FERRITIN 746 (H) 09/05/2023   IRON 47 09/05/2023   TIBC 263 09/05/2023   UIBC 216 09/05/2023   IRONPCTSAT 18 09/05/2023   Lab Results  Component Value Date   RETICCTPCT 1.3 09/05/2023   RBC 3.91 (L) 03/04/2024   Lab Results  Component Value Date   KPAFRELGTCHN 35.8 (H) 08/07/2021   LAMBDASER 21.2 08/07/2021   KAPLAMBRATIO 1.69 (H) 08/07/2021   Lab Results  Component Value Date   IGGSERUM 1,168 08/07/2021   IGA 207 08/07/2021   IGMSERUM 109 08/07/2021   Lab Results  Component Value Date   TOTALPROTELP 6.0 08/07/2021   ALBUMINELP 3.1 08/07/2021   A1GS 0.2 08/07/2021   A2GS 0.7 08/07/2021   BETS 0.8 08/07/2021   GAMS 1.1 08/07/2021   MSPIKE Not Observed 08/07/2021     Chemistry      Component Value Date/Time   NA 141 09/05/2023 1313   NA 142 06/19/2023 1604   K 4.5 09/05/2023 1313   CL 102 09/05/2023 1313   CO2 33 (H) 09/05/2023 1313   BUN 16 09/05/2023 1313   BUN 16 06/19/2023 1604   CREATININE 1.10 09/05/2023 1313      Component Value Date/Time   CALCIUM  9.2 09/05/2023 1313   ALKPHOS 62 09/05/2023 1313   AST 14 (L) 09/05/2023 1313   ALT 8 09/05/2023 1313   BILITOT 0.4 09/05/2023 1313       Impression and Plan: Noah Scott is a pleasant 88 yo gentleman with history of IDA and erythropoietin  deficiency so far with stable Hgb not requiring an ESA.  Hgb today is 11.9, MCV 92.  Iron studies are pending. We will replace if needed.  Follow-up in 6 months.   Kennard Pea, NP 6/18/20252:05 PM

## 2024-03-05 LAB — IRON AND IRON BINDING CAPACITY (CC-WL,HP ONLY)
Iron: 51 ug/dL (ref 45–182)
Saturation Ratios: 21 % (ref 17.9–39.5)
TIBC: 239 ug/dL — ABNORMAL LOW (ref 250–450)
UIBC: 188 ug/dL (ref 117–376)

## 2024-03-30 ENCOUNTER — Other Ambulatory Visit: Payer: Self-pay | Admitting: Cardiology

## 2024-04-07 ENCOUNTER — Ambulatory Visit (INDEPENDENT_AMBULATORY_CARE_PROVIDER_SITE_OTHER)

## 2024-04-07 VITALS — Ht 70.0 in | Wt 208.0 lb

## 2024-04-07 DIAGNOSIS — Z0000 Encounter for general adult medical examination without abnormal findings: Secondary | ICD-10-CM | POA: Diagnosis not present

## 2024-04-07 NOTE — Progress Notes (Signed)
 Subjective:   Noah Scott is a 89 y.o. who presents for a Medicare Wellness preventive visit.  As a reminder, Annual Wellness Visits don't include a physical exam, and some assessments may be limited, especially if this visit is performed virtually. We may recommend an in-person follow-up visit with your provider if needed.  Visit Complete: Virtual I connected with  Noah Scott on 04/07/24 by a audio enabled telemedicine application and verified that I am speaking with the correct person using two identifiers.  Patient Location: Home  Provider Location: Home Office  I discussed the limitations of evaluation and management by telemedicine. The patient expressed understanding and agreed to proceed.  Vital Signs: Because this visit was a virtual/telehealth visit, some criteria may be missing or patient reported. Any vitals not documented were not able to be obtained and vitals that have been documented are patient reported.  VideoDeclined- This patient declined Librarian, academic. Therefore the visit was completed with audio only.  Persons Participating in Visit: Patient.  AWV Questionnaire: No: Patient Medicare AWV questionnaire was not completed prior to this visit.  Cardiac Risk Factors include: advanced age (>26men, >26 women);dyslipidemia;male gender;hypertension     Objective:    Today's Vitals   04/07/24 0911  Weight: 208 lb (94.3 kg)  Height: 5' 10 (1.778 m)   Body mass index is 29.84 kg/m.     04/07/2024    9:22 AM 03/04/2024    2:12 PM 09/05/2023    1:42 PM 05/31/2023    2:05 PM 03/05/2023    1:35 PM 12/03/2022   11:58 AM 10/17/2022   10:13 AM  Advanced Directives  Does Patient Have a Medical Advance Directive? No Yes Yes Yes Yes Yes Yes  Type of Furniture conservator/restorer;Living will Healthcare Power of [place];Living will Healthcare Power of [place];Living will Healthcare Power of [place];Living will   Healthcare Power of [place];Living will  Does patient want to make changes to medical advance directive?  No - Patient declined No - Patient declined  No - Patient declined    Copy of Healthcare Power of Attorney in Chart?  No - copy requested No - copy requested No - copy requested No - copy requested  No - copy requested  Would patient like information on creating a medical advance directive? Yes (MAU/Ambulatory/Procedural Areas - Information given) No - Patient declined No - Patient declined  No - Patient declined      Current Medications (verified) Outpatient Encounter Medications as of 04/07/2024  Medication Sig   acetaminophen  (TYLENOL ) 650 MG CR tablet Take 650 mg by mouth every 8 (eight) hours as needed for pain.   Aromatic Inhalants (VICKS VAPOR INHALER IN) Inhale 1 Inhaler into the lungs as needed (stuffy nose).   Cholecalciferol (D3 EXTRA STRENGTH) 125 MCG (5000 UT) CAPS Take 1 capsule by mouth daily.   cyanocobalamin (VITAMIN B12) 1000 MCG tablet Take 1,000 mcg by mouth daily.   docusate sodium (COLACE) 100 MG capsule Take 100 mg by mouth 2 (two) times daily.   doxazosin  (CARDURA ) 8 MG tablet Take 1 tablet (8 mg total) by mouth daily.   furosemide  (LASIX ) 20 MG tablet TAKE 2 TABLETS EVERY OTHER DAY AND 1 TABLET ON ODD DAYS. MAY TAKE AN ADDITIONAL TABLET FOR WEIGHT GAIN OF 3 LBS OVERNIGHT OR INCREASED SWELLING   losartan  (COZAAR ) 50 MG tablet Take 1 tablet (50 mg total) by mouth daily.   magnesium oxide (MAG-OX) 400 (240 Mg) MG tablet  Take 400 mg by mouth daily.   metoprolol  tartrate (LOPRESSOR ) 25 MG tablet TAKE 1/2 TABLET(12.5 MG) BY MOUTH TWICE DAILY   nitroGLYCERIN  (NITROSTAT ) 0.6 MG SL tablet Place 0.6 mg under the tongue as needed.   prasugrel  (EFFIENT ) 5 MG TABS tablet Take 1 tablet (5 mg total) by mouth daily.   rosuvastatin  (CRESTOR ) 20 MG tablet TAKE 1 TABLET(20 MG) BY MOUTH DAILY   traMADol  (ULTRAM ) 50 MG tablet Take 1- 1.5 tablets every 6 hours as needed for pain   No  facility-administered encounter medications on file as of 04/07/2024.    Allergies (verified) Plavix  [clopidogrel  bisulfate], Brilinta  [ticagrelor ], and Enalapril   History: Past Medical History:  Diagnosis Date   CAD S/P percutaneous coronary angioplasty 2001   a) 2001 ([place]): PCI OM1 - now with ~65% ISR.;; b) NSTEMI 11/2017 - ost-prox RCA 75-65% (Overlapping ONYX DES 3.0 x 26 & 3.0 x 8 --> 3.5-3.3 mm). m-dRCA 95% (ONYX DES 2.75 x 18 mm -> 3.0 mm). Ost OM1 65% ISR (med Rx). dRCA-OstRDA (50%-25%)   Essential (primary) hypertension 11/21/2017   Hyperlipidemia 11/21/2017   Iron deficiency anemia due to chronic blood loss 08/08/2021   Iron malabsorption 08/08/2021   Neurogenic claudication due to lumbar spinal stenosis 02/06/2018   NSTEMI (non-ST elevated myocardial infarction) (HCC) 11/21/2017   Multiple RCA lesions - 2 Overlapping DES Ost-Prox RCA & 1 in mid-distal RCA   Spinal stenosis of lumbar region    Venous reflux 11/2020   Venous Dopplers 12/15/2020: No DVT or superficial venous thrombosis bilaterally.  Venous reflux noted in bilateral FV and saphenofemoral junctions, R GSV in the thigh and R SFV, left proximal thigh GSV. -->  No invasive options due to deep and superficial venous reflux.   Past Surgical History:  Procedure Laterality Date   CORONARY STENT INTERVENTION N/A 11/25/2017   Procedure: CORONARY STENT INTERVENTION;  Surgeon: Anner Alm ORN, MD;  Location: [facility] INVASIVE CV LAB;  Service: Cardiovascular:   m-dRCA 95%  (DES PCI - 0%.  Resolute ONYX DES 2.75 x 18 mm - ~3.0 mm). Ost-proxRCA 75-65% (DES PCI-0%. 2 overlapping Resolute ONYX DES 3.0 x 26 & 3.0 x 8 mm --> 3.5-3.3 mm)   CORONARY STENT INTERVENTION  2001   ([place]): PCI OM1   LEA DOPPLERS  12/12/2017   Normal bilateral ABIs and TBI's.   LEFT HEART CATH AND CORONARY ANGIOGRAPHY N/A 11/25/2017   Procedure: LEFT HEART CATH AND CORONARY ANGIOGRAPHY;  Surgeon: Anner Alm ORN, MD;  Location: [facility] INVASIVE CV  LAB;  Service: Cardiovascular:  Ost-proxRCA 75&65%(DES PCI), m-dRCA 95% (DES PCI), dRCA 50%-25% ost rPDA. ostOM1 65% ISR (from 2001).    LUMBAR LAMINECTOMY/DECOMPRESSION MICRODISCECTOMY Bilateral 06/13/2018   Procedure: Laminectomy and Foraminotomy - L1-L2 - L2-L3 - L3-L4 - bilateral;  Surgeon: Joshua Alm RAMAN, MD;  Location: [facility] OR;  Service: Neurosurgery;  Laterality: Bilateral;   TRANSTHORACIC ECHOCARDIOGRAM  11/23/2017   a) 11/2017: Nl LV size & Fxn. EF 60 - 65%.  No RWMA.  GR 1 DD.  Mild RV dilation.; b) 05/2020:([facility]): Normal LV size and function.  EF 60 and 65%.  Normal RV size and function.  Aortic sclerosis with no stenosis.  No AI.  Mildly dilated ascending aorta.  CVP ~5 mm.   TRANSTHORACIC ECHOCARDIOGRAM  03/31/2021   EF 55 to 60%.  GR 1 DD.  Ascending aorta measured at 41 mm-moderate dilation.  Recommended follow-up with imaging study in 6 months to reassess.   Family History  Problem  Relation Age of Onset   Heart disease Father        Pt does not know details   Stroke Neg Hx    Diabetes Neg Hx    Social History   Socioeconomic History   Marital status: Widowed    Spouse name: Not on file   Number of children: 1   Years of education: 24   Highest education level: Not on file  Occupational History   Occupation: retired Geologist, engineering  Tobacco Use   Smoking status: Former    Current packs/day: 0.00    Types: Cigarettes    Quit date: 03/02/1969    Years since quitting: 55.1   Smokeless tobacco: Never  Vaping Use   Vaping status: Never Used  Substance and Sexual Activity   Alcohol use: Not Currently   Drug use: No   Sexual activity: Not on file  Other Topics Concern   Not on file  Social History Narrative   Lives alone in an apartment on the first floor.  Has one daughter.  Retired Geologist, engineering.  Education: some college.    Social Drivers of Corporate investment banker Strain: Low Risk  (04/07/2024)   Overall Financial Resource Strain (CARDIA)     Difficulty of Paying Living Expenses: Not hard at all  Food Insecurity: No Food Insecurity (04/07/2024)   Hunger Vital Sign    Worried About Running Out of Food in the Last Year: Never true    Ran Out of Food in the Last Year: Never true  Transportation Needs: No Transportation Needs (04/07/2024)   PRAPARE - Administrator, Civil Service (Medical): No    Lack of Transportation (Non-Medical): No  Physical Activity: Insufficiently Active (04/07/2024)   Exercise Vital Sign    Days of Exercise per Week: 5 days    Minutes of Exercise per Session: 10 min  Stress: No Stress Concern Present (04/07/2024)   [employer] of Occupational Health - Occupational Stress Questionnaire    Feeling of Stress: Not at all  Social Connections: Moderately Isolated (04/07/2024)   Social Connection and Isolation Panel    Frequency of Communication with Friends and Family: More than three times a week    Frequency of Social Gatherings with Friends and Family: More than three times a week    Attends Religious Services: Never    Database administrator or Organizations: Yes    Attends Banker Meetings: 1 to 4 times per year    Marital Status: Widowed    Tobacco Counseling Counseling given: Not Answered    Clinical Intake:  Pre-visit preparation completed: Yes  Pain : No/denies pain  Diabetes: No  Lab Results  Component Value Date   HGBA1C 6.2 11/13/2022   HGBA1C 5.8 (H) 11/21/2021     How often do you need to have someone help you when you read instructions, pamphlets, or other written materials from your doctor or pharmacy?: 1 - Never  Interpreter Needed?: No  Information entered by :: Charmaine Bloodgood LPN   Activities of Daily Living     04/07/2024    9:18 AM  In your present state of health, do you have any difficulty performing the following activities:  Hearing? 0  Vision? 0  Difficulty concentrating or making decisions? 0  Walking or climbing stairs? 0   Dressing or bathing? 0  Doing errands, shopping? 0  Preparing Food and eating ? N  Using the Toilet? N  In the past six  months, have you accidently leaked urine? N  Do you have problems with loss of bowel control? N  Managing your Medications? N  Managing your Finances? N  Housekeeping or managing your Housekeeping? N    Patient Care Team: Saguier, Edward, PA-C as PCP - General (Internal Medicine) Anner Alm ORN, MD as PCP - Cardiology (Cardiology) Joshua Alm Hamilton, MD as Consulting Physician (Neurosurgery) Franchot Lauraine HERO, NP as Nurse Practitioner (Nurse Practitioner) Noah Fidela CROME, NP as Nurse Practitioner (Physical Medicine and Rehabilitation) Dulcie Gretta POUR, DPM as Referring Physician (Podiatry)  I have updated your Care Teams any recent Medical Services you may have received from other providers in the past year.     Assessment:   This is a routine wellness examination for [facility].  Hearing/Vision screen Hearing Screening - Comments:: Denies hearing difficulties   Vision Screening - Comments:: Wears rx glasses - up to date with routine eye exams     Goals Addressed             This Visit's Progress    Patient Stated   On track    Stay healthy        Depression Screen     04/07/2024    9:21 AM 02/12/2024    1:01 PM 10/14/2023    1:24 PM 08/14/2023   12:53 PM 05/13/2023    1:42 PM 10/17/2022   10:10 AM 08/13/2022   10:33 AM  PHQ 2/9 Scores  PHQ - 2 Score 0 0 0 0 0 0 0  PHQ- 9 Score     2      Fall Risk     04/07/2024    9:23 AM 02/12/2024    1:01 PM 10/14/2023    1:24 PM 08/14/2023   12:53 PM 05/13/2023    1:36 PM  Fall Risk   Falls in the past year? 0 0 0 0 0  Number falls in past yr: 0  0  0  Injury with Fall? 0      Risk for fall due to : Impaired mobility      Follow up Falls prevention discussed;Education provided;Falls evaluation completed        MEDICARE RISK AT HOME:  Medicare Risk at Home Any stairs in or around the home?:  No If so, are there any without handrails?: No Home free of loose throw rugs in walkways, pet beds, electrical cords, etc?: Yes Adequate lighting in your home to reduce risk of falls?: Yes Life alert?: No Use of a cane, walker or w/c?: Yes Grab bars in the bathroom?: Yes Shower chair or bench in shower?: No Elevated toilet seat or a handicapped toilet?: Yes  TIMED UP AND GO:  Was the test performed?  No  Cognitive Function: Declined/Normal: No cognitive concerns noted by patient or family. Patient alert, oriented, able to answer questions appropriately and recall recent events. No signs of memory loss or confusion.        10/17/2022   10:16 AM  6CIT Screen  What Year? 0 points  What month? 0 points  What time? 0 points  Count back from 20 0 points  Months in reverse 0 points  Repeat phrase 0 points  Total Score 0 points    Immunizations Immunization History  Administered Date(s) Administered   Fluad Trivalent(High Dose 65+) 05/28/2023   Influenza, High Dose Seasonal PF 06/27/2016, 06/23/2017, 05/19/2020, 07/03/2021   Influenza-Unspecified 08/09/2005, 07/18/2006, 06/19/2019   Moderna Sars-Covid-2 Vaccination 10/31/2019, 11/28/2019, 07/25/2020, 12/29/2020   Pneumococcal  Conjugate-13 02/19/2014   Zoster Recombinant(Shingrix) 01/14/2017, 06/28/2017, 06/28/2017   Zoster, Live 02/19/2014    Screening Tests Health Maintenance  Topic Date Due   DTaP/Tdap/Td (1 - Tdap) Never done   Pneumococcal Vaccine: 50+ Years (2 of 2 - PPSV23, PCV20, or PCV21) 05/27/2024 (Originally 04/16/2014)   INFLUENZA VACCINE  04/17/2024   Medicare Annual Wellness (AWV)  04/07/2025   Zoster Vaccines- Shingrix  Completed   Hepatitis B Vaccines  Aged Out   HPV VACCINES  Aged Out   Meningococcal B Vaccine  Aged Out   COVID-19 Vaccine  Discontinued    Health Maintenance  Health Maintenance Due  Topic Date Due   DTaP/Tdap/Td (1 - Tdap) Never done   Health Maintenance Items  Addressed: Information provided on recommended vaccines   Additional Screening:  Vision Screening: Recommended annual ophthalmology exams for early detection of glaucoma and other disorders of the eye. Would you like a referral to an eye doctor? No    Dental Screening: Recommended annual dental exams for proper oral hygiene  Community Resource Referral / Chronic Care Management: CRR required this visit?  No   CCM required this visit?  No   Plan:    I have personally reviewed and noted the following in the patient's chart:   Medical and social history Use of alcohol, tobacco or illicit drugs  Current medications and supplements including opioid prescriptions. Patient is not currently taking opioid prescriptions. Functional ability and status Nutritional status Physical activity Advanced directives List of other physicians Hospitalizations, surgeries, and ER visits in previous 12 months Vitals Screenings to include cognitive, depression, and falls Referrals and appointments  In addition, I have reviewed and discussed with patient certain preventive protocols, quality metrics, and best practice recommendations. A written personalized care plan for preventive services as well as general preventive health recommendations were provided to patient.   Lavelle Pfeiffer [place], [place]   2/77/7974   After Visit Summary: (Pick Up) Due to this being a telephonic visit, with patients personalized plan was offered to patient and patient has requested to Pick up at office.  Notes: Nothing significant to report at this time.

## 2024-04-07 NOTE — Patient Instructions (Signed)
 Noah Scott , Thank you for taking time out of your busy schedule to complete your Annual Wellness Visit with me. I enjoyed our conversation and look forward to speaking with you again next year. I, as well as your care team,  appreciate your ongoing commitment to your health goals. Please review the following plan we discussed and let me know if I can assist you in the future. Your Game plan/ To Do List    Follow up Visits: Next Medicare AWV with our clinical staff: In 1 year    Have you seen your provider in the last 6 months (3 months if uncontrolled diabetes)? Yes Next Office Visit with your provider: 1/6/6 @ 1  Clinician Recommendations:  Aim for 30 minutes of exercise or brisk walking, 6-8 glasses of water, and 5 servings of fruits and vegetables each day.       This is a list of the screening recommended for you and due dates:  Health Maintenance  Topic Date Due   DTaP/Tdap/Td vaccine (1 - Tdap) Never done   Medicare Annual Wellness Visit  10/18/2023   Pneumococcal Vaccine for age over 78 (2 of 2 - PPSV23, PCV20, or PCV21) 05/27/2024*   Flu Shot  04/17/2024   Zoster (Shingles) Vaccine  Completed   Hepatitis B Vaccine  Aged Out   HPV Vaccine  Aged Out   Meningitis B Vaccine  Aged Out   COVID-19 Vaccine  Discontinued  *Topic was postponed. The date shown is not the original due date.    Advanced directives: (ACP Link)Information on Advanced Care Planning can be found at [place]  Secretary of [facility] Advance Health Care Directives Advance Health Care Directives. [email or web address]   Advance Care Planning is important because it:  [x]  Makes sure you receive the medical care that is consistent with your values, goals, and preferences  [x]  It provides guidance to your family and loved ones and reduces their decisional burden about whether or not they are making the right decisions based on your wishes.  Follow the link provided in your after visit summary or read over the paperwork we  have mailed to you to help you started getting your Advance Directives in place. If you need assistance in completing these, please reach out to us  so that we can help you!  See attachments for Preventive Care and Fall Prevention Tips.

## 2024-05-03 ENCOUNTER — Other Ambulatory Visit: Payer: Self-pay | Admitting: Medical

## 2024-05-10 ENCOUNTER — Other Ambulatory Visit: Payer: Self-pay | Admitting: Physical Medicine and Rehabilitation

## 2024-05-10 DIAGNOSIS — G8929 Other chronic pain: Secondary | ICD-10-CM

## 2024-05-11 ENCOUNTER — Other Ambulatory Visit: Payer: Self-pay | Admitting: Medical

## 2024-05-11 NOTE — Telephone Encounter (Signed)
 Requesting: tramadol  Contract:10/17/23 UDS:10/17/23 Last Visit:02/25/24 Next Visit:09/22/24 Last Refill:10/14/23  Please Advise

## 2024-05-11 NOTE — Telephone Encounter (Signed)
 Copied from CRM #[id number]. Topic: Clinical - Medication Refill >> May 11, 2024  9:42 AM Marissa P wrote: Medication: traMADol  (ULTRAM ) 50 MG tablet  Has the patient contacted their pharmacy? Yes (Agent: If no, request that the patient contact the pharmacy for the refill. If patient does not wish to contact the pharmacy document the reason why and proceed with request.) (Agent: If yes, when and what did the pharmacy advise?)  This is the patient's preferred pharmacy:  [id number] - [place], [place] - [address] [place] [address] Phone: [phone] Fax: [phone]  Is this the correct pharmacy for this prescription? Yes If no, delete pharmacy and type the correct one.   Has the prescription been filled recently? Yes  Is the patient out of the medication? No  Has the patient been seen for an appointment in the last year OR does the patient have an upcoming appointment? Yes  Can we respond through MyChart? No  Agent: Please be advised that Rx refills may take up to 3 business days. We ask that you follow-up with your pharmacy.

## 2024-05-13 ENCOUNTER — Other Ambulatory Visit: Payer: Self-pay

## 2024-05-14 ENCOUNTER — Telehealth: Payer: Self-pay | Admitting: Registered Nurse

## 2024-05-14 MED ORDER — TRAMADOL HCL 50 MG PO TABS: ORAL_TABLET | ORAL | 5 refills | Status: DC

## 2024-05-14 NOTE — Telephone Encounter (Signed)
PMP was Reviewed.  Tramadol e-scribed to pharmacy.

## 2024-06-03 ENCOUNTER — Other Ambulatory Visit: Payer: Self-pay | Admitting: Medical

## 2024-06-04 ENCOUNTER — Other Ambulatory Visit: Payer: Self-pay | Admitting: Cardiology

## 2024-07-16 ENCOUNTER — Other Ambulatory Visit: Payer: Self-pay | Admitting: Medical

## 2024-07-16 DIAGNOSIS — I25118 Atherosclerotic heart disease of native coronary artery with other forms of angina pectoris: Secondary | ICD-10-CM

## 2024-07-16 DIAGNOSIS — I10 Essential (primary) hypertension: Secondary | ICD-10-CM

## 2024-07-16 NOTE — Telephone Encounter (Signed)
 Copied from CRM #[id number]. Topic: Clinical - Medication Refill >> Jul 16, 2024  9:22 AM Rea C wrote: Medication: losartan  (COZAAR ) 50 MG tablet  Has the patient contacted their pharmacy? Yes  (Agent: If no, request that the patient contact the pharmacy for the refill. If patient does not wish to contact the pharmacy document the reason why and proceed with request.) (Agent: If yes, when and what did the pharmacy advise?)  This is the patient's preferred pharmacy:  [id number] - [place], [place] - [address] [place]  [address] Phone: [phone] Fax: [phone]  Is this the correct pharmacy for this prescription? Yes If no, delete pharmacy and type the correct one.   Has the prescription been filled recently? Yes  Is the patient out of the medication? Not yet   Has the patient been seen for an appointment in the last year OR does the patient have an upcoming appointment? Yes  Can we respond through MyChart? Yes  Agent: Please be advised that Rx refills may take up to 3 business days. We ask that you follow-up with your pharmacy.

## 2024-07-17 ENCOUNTER — Other Ambulatory Visit: Payer: Self-pay | Admitting: Cardiology

## 2024-07-17 DIAGNOSIS — I25118 Atherosclerotic heart disease of native coronary artery with other forms of angina pectoris: Secondary | ICD-10-CM

## 2024-07-17 DIAGNOSIS — I10 Essential (primary) hypertension: Secondary | ICD-10-CM

## 2024-07-21 NOTE — Telephone Encounter (Signed)
 I am not sure if he is on the medicine or not.  I have not seen him in over 2 years.  He was seen by Damien Braver, NP back in October 2024.  He was due for annual follow-up and we should get him seen to determine if we need to refill the medication.  DH

## 2024-07-24 ENCOUNTER — Telehealth: Payer: Self-pay | Admitting: Cardiology

## 2024-07-24 DIAGNOSIS — I10 Essential (primary) hypertension: Secondary | ICD-10-CM

## 2024-07-24 DIAGNOSIS — I25118 Atherosclerotic heart disease of native coronary artery with other forms of angina pectoris: Secondary | ICD-10-CM

## 2024-07-24 MED ORDER — LOSARTAN POTASSIUM 50 MG PO TABS: 50.0000 mg | ORAL_TABLET | Freq: Every day | ORAL | 0 refills | Status: DC

## 2024-07-24 NOTE — Telephone Encounter (Signed)
*  STAT* If patient is at the pharmacy, call can be transferred to refill team.   1. Which medications need to be refilled? (please list name of each medication and dose if known)   losartan  (COZAAR ) 50 MG tablet   2. Would you like to learn more about the convenience, safety, & potential cost savings by using the [facility]?   3. Are you open to using the [facility] (Type [facility]. ).  4. Which pharmacy/location (including street and city if local pharmacy) is medication to be sent to?  [id number] - [place], [place] - [address]   5. Do they need a 30 day or 90 day supply?   90 day  Patient stated he is completely out of this medication.  Patient has appointment scheduled with Dr. Anner on 11/10.

## 2024-07-24 NOTE — Telephone Encounter (Signed)
 Pt scheduled to see Dr. Anner 07/27/24, 15 days supply sent in.

## 2024-07-27 ENCOUNTER — Ambulatory Visit: Attending: Cardiology | Admitting: Cardiology

## 2024-07-27 ENCOUNTER — Encounter: Payer: Self-pay | Admitting: Cardiology

## 2024-07-27 VITALS — BP 140/68 | HR 68 | Ht 70.0 in | Wt 210.2 lb

## 2024-07-27 DIAGNOSIS — Z79899 Other long term (current) drug therapy: Secondary | ICD-10-CM | POA: Insufficient documentation

## 2024-07-27 DIAGNOSIS — Z9861 Coronary angioplasty status: Secondary | ICD-10-CM | POA: Insufficient documentation

## 2024-07-27 DIAGNOSIS — M25562 Pain in left knee: Secondary | ICD-10-CM

## 2024-07-27 DIAGNOSIS — I214 Non-ST elevation (NSTEMI) myocardial infarction: Secondary | ICD-10-CM | POA: Diagnosis present

## 2024-07-27 DIAGNOSIS — I25118 Atherosclerotic heart disease of native coronary artery with other forms of angina pectoris: Secondary | ICD-10-CM | POA: Insufficient documentation

## 2024-07-27 DIAGNOSIS — D500 Iron deficiency anemia secondary to blood loss (chronic): Secondary | ICD-10-CM | POA: Diagnosis present

## 2024-07-27 DIAGNOSIS — M25561 Pain in right knee: Secondary | ICD-10-CM

## 2024-07-27 DIAGNOSIS — I77810 Thoracic aortic ectasia: Secondary | ICD-10-CM

## 2024-07-27 DIAGNOSIS — I10 Essential (primary) hypertension: Secondary | ICD-10-CM | POA: Diagnosis present

## 2024-07-27 DIAGNOSIS — I2510 Atherosclerotic heart disease of native coronary artery without angina pectoris: Secondary | ICD-10-CM | POA: Insufficient documentation

## 2024-07-27 DIAGNOSIS — G8929 Other chronic pain: Secondary | ICD-10-CM

## 2024-07-27 DIAGNOSIS — E785 Hyperlipidemia, unspecified: Secondary | ICD-10-CM | POA: Insufficient documentation

## 2024-07-27 MED ORDER — LOSARTAN POTASSIUM 50 MG PO TABS: 50.0000 mg | ORAL_TABLET | Freq: Every day | ORAL | 3 refills | Status: DC

## 2024-07-27 MED ORDER — PRASUGREL HCL 5 MG PO TABS: 5.0000 mg | ORAL_TABLET | Freq: Every day | ORAL | 3 refills | Status: AC

## 2024-07-27 NOTE — Patient Instructions (Addendum)
 Medication Instructions:  No changes   *If you need a refill on your cardiac medications before your next appointment, please call your pharmacy*   Lab Work: Lipid Cbc Cmet  If you have labs (blood work) drawn today and your tests are completely normal, you will receive your results only by: MyChart Message (if you have MyChart) OR A paper copy in the mail If you have any lab test that is abnormal or we need to change your treatment, we will call you to review the results.   Testing/Procedures:  Not needed  Follow-Up: At [facility], you and your health needs are our priority.  As part of our continuing mission to provide you with exceptional heart care, we have created designated Provider Care Teams.  These Care Teams include your primary Cardiologist (physician) and Advanced Practice Providers (APPs -  Physician Assistants and Nurse Practitioners) who all work together to provide you with the care you need, when you need it.     Your next appointment:   12 month(s)  The format for your next appointment:   In Person  Provider:   Alm Clay, MD or an APP    Your physician recommends that you schedule a follow-up  nurse appointment in: to flow up blood pressure -   Aug 19, 2024  at 2:00 pm

## 2024-07-27 NOTE — Progress Notes (Signed)
 Cardiology Office Note:  .   Date:  08/05/2024  ID:  Noah Scott, DOB Sep 26, 1935, MRN [id number] PCP: Dorina Dallas RIGGERS  [facility] Providers Cardiologist:  Alm Clay, MD     Chief Complaint  Patient presents with   Follow-up    Annual follow-up   Coronary Artery Disease    No angina    Patient Profile: .     Noah Scott is a 88 y.o. male  with a PMH notable for CAD s/p prior PCI-OM1, s/p DES x 2 overlapping-o-pRCA and DES-m-dRCA in 2019, dilation of ascending aorta, hypertension, hyperlipidemia, palpitations, lower extremity edema the setting of venous insufficiency, iron deficiency anemia, and obesity  who presents here for annual at the request of Saguier, [place], PA-C.  PMH CAD S/P PCI a) 2001 ([place]): PCI OM1 - now with ~65% ISR.;;  b) NSTEMI 11/2017 - ost-prox RCA 75-65% (Overlapping ONYX DES 3.0 x 26 & 3.0 x 8 --> 3.5-3.3 mm). m-dRCA 95% (ONYX DES 2.75 x 18 mm -> 3.0 mm). Ost OM1 65% ISR (med Rx). dRCA-OstRDA (50%-25%) - > Allergy/adverse reaction to both Plavix  and Brilinta .  On maintenance dose Effient  5 mg daily 2001  HTN   HLD   Chronic venous insufficiency       Noah Scott was last seen on 07/04/2023 by Damien Braver, NP: Doing well for cardiac standpoint.  No angina or heart failure symptoms.  Stable edema.  Discussed possibility of switching from Effient  to aspirin .  No changes made.  Subjective  Discussed the use of AI scribe software for clinical note transcription with the patient, who gave verbal consent to proceed.  History of Present Illness Noah Scott is an 88 year old male with hypertension and coronary artery disease who presents for medication refill and follow-up.  He is here for a follow-up visit primarily to renew his prescription for losartan , which was only refilled for fifteen days due to not having been seen in the last year. He is frustrated with the scheduling system, which did not allow him to book an appointment  six months in advance, leading to a delay in his follow-up visit.  His blood pressure readings at home are generally low, but they tend to increase with physical activity. He does not have a home blood pressure monitor but mentions that his blood pressure is usually checked during other medical visits. No symptoms of heart racing, skipping, or flipping. No shortness of breath or chest pain during exercise, which he performs regularly at a recreational center.  He has a history of coronary artery disease with multiple stents placed, the most recent being in 2019. He is currently on Effient  5 mg daily due to intolerance to other antiplatelet medications. He also takes metoprolol  25 mg (1/2 tablet) twice daily, rosuvastatin  20 mg daily, and losartan  50 mg daily. He was out of losartan  for a few days before receiving the fifteen-day supply.  He experiences frequent urination at night, approximately every twenty minutes, but controls it better during the day. He has a history of arthritis affecting his knees and hands, with swelling and stiffness noted. He uses a cane for balance during longer walks and performs light exercises to maintain mobility. No significant swelling in his legs and he elevates them regularly.  He recalls having a CT angiogram of the thoracic aorta in October last year, which showed a measurement of four centimeters. He has not had a recent cholesterol check since February 2024 and  plans to have blood work done soon.   Cardiovascular ROS: no chest pain or dyspnea on exertion positive for - edema and this is mostly related to joint pains.  Swelling goes down with elevation. negative for - irregular heartbeat, orthopnea, palpitations, paroxysmal nocturnal dyspnea, rapid heart rate, shortness of breath, or other than occasional balance issues, no lightheadedness, dizziness or wooziness.  No syncope/near syncope or TIA/amaurosis fugax, claudication.  No melena, hematochezia, hematuria or  epistaxis.  ROS:  Review of Systems - Negative except symptoms noted above.    Objective   Medications - Effient  5 mg daily - Metoprolol  to tartrate 12.5 mg twice daily;- Losartan  50 mg daily - Rosuvastatin  20 mg daily -  PRN NTG - Furosemide  20 mg which she is not taking PRN - Doxazosin  8 mg daily - As needed Ultram  50 mg 1 to 1-1/2 tablets every 6 hours as needed pain  Studies Reviewed: SABRA   EKG Interpretation Date/Time:  Monday July 27 2024 13:54:55 EST Ventricular Rate:  68 PR Interval:  208 QRS Duration:  96 QT Interval:  408 QTC Calculation: 433 R Axis:   -19  Text Interpretation: Normal sinus rhythm Normal ECG When compared with ECG of 19-Jun-2023 15:02, Premature atrial complexes are no longer Present Confirmed by Anner Lenis ([id number]) on 07/27/2024 2:07:52 PM    Lab Results  Component Value Date   CHOL 112 11/13/2022   HDL 41.30 11/13/2022   LDLCALC 58 11/13/2022   TRIG 62.0 11/13/2022   CHOLHDL 3 11/13/2022   Lab Results  Component Value Date   NA 145 03/04/2024   K 4.2 03/04/2024   CREATININE 1.10 03/04/2024   GFRNONAA >60 03/04/2024   GLUCOSE 102 (H) 03/04/2024   Lab Results  Component Value Date   WBC 6.3 03/04/2024   HGB 11.9 (L) 03/04/2024   HCT 36.2 (L) 03/04/2024   MCV 92.6 03/04/2024   PLT 210 03/04/2024   Lab Results  Component Value Date   HGBA1C 6.2 11/13/2022   Results RADIOLOGY CT angiogram: Thoracic aorta measured at 4 cm (06/2023)  DIAGNOSTIC Echocardiogram: Aortic valve calcification without stenosis ECHO: (03/31/2021): LVEF 55 to 60%.  Normal function.  No RWMA.  G1 DD.  Mildly dilated RV with normal RAP.  Normal mitral valve.  Mild AV sclerosis with no stenosis.  Aortic root/ascending aorta 41 mm  CATH: (11/25/2017) -=> referred due to abnormal Myoview  showing large inferior and inferolateral partially reversible perfusion defect. Culprit Lesion #1: Mid RCA to Dist RCA lesion is 95% stenosed. A drug-eluting stent was  successfully placed using a STENT RESOLUTE ONYX [id number]. Postdilated to almost 3.0 mm. Post intervention, there is a 0% residual stenosis.  Lesion set #2: Ost RCA to Prox RCA lesion is 75% stenosed. Prox RCA lesion is 65% stenosed. 2 overlapping Drug-Eluting Stents were successfully placed using a STENT RESOLUTE ONYX 3.0X26 and a STENT RESOLUTE ONYX 3.0X26. Overlapping proximally (postdilated to 3.5 mill meters proximal down to 3.3 mm distal.  Post intervention, there is a 0% residual stenosis.  Dist RCA lesion is 50% stenosed with 25% stenosed side branch in Ost RPDA.SABRA Pais 1st Mrg to 1st Mrg lesion is 65% stenosed. This is within a previously placed stent from 2001 LV end diastolic pressure is normal.   Diagnostic     Intervention         Risk Assessment/Calculations:           Physical Exam:   VS: Initial blood pressure check was 160/82.  Repeat  BP (!) 140/68 (BP Location: Left Arm, Patient Position: Sitting, Cuff Size: Normal)   Pulse 68   Ht 5' 10 (1.778 m)   Wt 210 lb 3.2 oz (95.3 kg)   SpO2 98%   BMI 30.16 kg/m    Wt Readings from Last 3 Encounters:  07/27/24 210 lb 3.2 oz (95.3 kg)  04/07/24 208 lb (94.3 kg)  03/04/24 208 lb (94.3 kg)      GEN: Well nourished, well groomed; in no acute distress; mildly obese but otherwise healthy appearing NECK: No JVD; No carotid bruits CARDIAC: Normal S1, S2; RRR, 1/6 SEM at RUSB.  No rubs, gallops RESPIRATORY:  Clear to auscultation without rales, wheezing or rhonchi ; nonlabored, good air movement. ABDOMEN: Soft, non-tender, non-distended EXTREMITIES:  No edema; No deformity      ASSESSMENT AND PLAN: .    Problem List Items Addressed This Visit       Cardiology Problems   CAD S/P percutaneous coronary angioplasty (Chronic)   He is on maintenance dose Effient  5 mg daily.  He has been on this for a long time.  He has multiple stents involving most of the RCA.  Unless he is having major bleeding issues prefer to maintain  Effient  5 mg daily, but low threshold to convert to aspirin  81 mg if there is issues of bleeding. -Check blood tests for blood counts, liver function, kidney function, and cholesterol levels.  Okay to hold Effient  9 days preop for surgeries or procedures. If acceptable, would replace with 81 mg aspirin  while not on Effient .      Relevant Medications   losartan  (COZAAR ) 50 MG tablet   prasugrel  (EFFIENT ) 5 MG TABS tablet   Coronary artery disease involving native coronary artery of native heart - Primary (Chronic)   Multivessel disease with moderate disease in the OM and long segments of stent in the RCA.  Also bifurcation distal RCA disease. Thankfully, has done well with no recurrent angina.  No RWMA on echo to suggest significant infarction.  He is on stable regimen currently, continue current meds: - Continue Effient  5 mg daily along with rosuvastatin  20 mg daily, metoprolol  tartrate 12.5 mg twice daily and losartan  50 mg daily.      Relevant Medications   losartan  (COZAAR ) 50 MG tablet   Other Relevant Orders   Lipid panel   Comprehensive metabolic panel with GFR   Dilatation of thoracic aorta (Chronic)   CTA last year showed thoracic aorta was 4 cm.  Does not aneurysmal.  Not imaged necessarily dilated in the full-grown man.  No need for further evaluation.  Continue to treat risk factors.      Relevant Medications   losartan  (COZAAR ) 50 MG tablet   Essential (primary) hypertension (Chronic)   Somewhat labile blood pressure.  He easily goes up with activity which is why his initial blood pressure was elevated.- Rechecked blood pressure before leaving clinic and it was relatively normal.. Home readings typically low.  -Continue current dose of losartan  50 mg daily and Lopressor  12.5 mg twice daily  - If blood pressure >160 mmHg, take extra losartan  dose. - Scheduled follow-up in a couple of months for blood pressure check.      Relevant Medications   losartan  (COZAAR ) 50  MG tablet   Other Relevant Orders   EKG 12-Lead (Completed)   Hyperlipidemia with target LDL less than 70 (Chronic)   Cholesterol levels last checked February 2024-levels are excellent.  - Continue rosuvastatin  20 mg daily. -  Ordered lipid panel to assess current cholesterol levels.      Relevant Medications   losartan  (COZAAR ) 50 MG tablet   Other Relevant Orders   Lipid panel   Comprehensive metabolic panel with GFR   NSTEMI (non-ST elevated myocardial infarction) (HCC) (Chronic)   6 years out from MI.  Doing well.  No further issues.  Follow-up echo showed preserved EF with no RWMA.      Relevant Medications   losartan  (COZAAR ) 50 MG tablet   Other Relevant Orders   Lipid panel   Comprehensive metabolic panel with GFR   CBC     Other   Chronic pain of both knees   Arthritis in multiple joints managed with physical activity and topical treatments. - Continue management with physical activity and topical treatments.      Encounter for long-term current use of medication   Iron deficiency anemia due to chronic blood loss (Chronic)   Iron deficiency anemia, chronic blood loss Chronic anemia with history of iron supplementation. No recent colonoscopy, history of polyp removal noted. - Ordered CBC to assess current anemia status.      Relevant Orders   CBC          Follow-Up: Return in about 1 year (around 07/27/2025).  I spent 52 minutes in the care of Noah Scott today including reviewing labs (1 minute), reviewing studies (I briefly reviewed his cath films and echocardiogram-4 minutes), face to face time discussing treatment options (29 minutes), reviewing records from my most recent note as well as APP notes (5 minutes), 13 minutes, and documenting in the encounter.      Signed, Alm MICAEL Clay, MD, MS Alm Clay, M.D., M.S. Interventional Cardiologist  [facility] Pager # [phone]

## 2024-08-05 ENCOUNTER — Encounter: Payer: Self-pay | Admitting: Cardiology

## 2024-08-05 NOTE — Assessment & Plan Note (Signed)
 Somewhat labile blood pressure.  He easily goes up with activity which is why his initial blood pressure was elevated.- Rechecked blood pressure before leaving clinic and it was relatively normal.. Home readings typically low.  -Continue current dose of losartan  50 mg daily and Lopressor  12.5 mg twice daily  - If blood pressure >160 mmHg, take extra losartan  dose. - Scheduled follow-up in a couple of months for blood pressure check.

## 2024-08-05 NOTE — Assessment & Plan Note (Signed)
 Iron deficiency anemia, chronic blood loss Chronic anemia with history of iron supplementation. No recent colonoscopy, history of polyp removal noted. - Ordered CBC to assess current anemia status.

## 2024-08-05 NOTE — Assessment & Plan Note (Signed)
 He is on maintenance dose Effient  5 mg daily.  He has been on this for a long time.  He has multiple stents involving most of the RCA.  Unless he is having major bleeding issues prefer to maintain Effient  5 mg daily, but low threshold to convert to aspirin  81 mg if there is issues of bleeding. -Check blood tests for blood counts, liver function, kidney function, and cholesterol levels.  Okay to hold Effient  9 days preop for surgeries or procedures. If acceptable, would replace with 81 mg aspirin  while not on Effient .

## 2024-08-05 NOTE — Assessment & Plan Note (Signed)
 Cholesterol levels last checked February 2024-levels are excellent.  - Continue rosuvastatin  20 mg daily. - Ordered lipid panel to assess current cholesterol levels.

## 2024-08-05 NOTE — Assessment & Plan Note (Signed)
 CTA last year showed thoracic aorta was 4 cm.  Does not aneurysmal.  Not imaged necessarily dilated in the full-grown man.  No need for further evaluation.  Continue to treat risk factors.

## 2024-08-05 NOTE — Assessment & Plan Note (Signed)
 6 years out from MI.  Doing well.  No further issues.  Follow-up echo showed preserved EF with no RWMA.

## 2024-08-05 NOTE — Assessment & Plan Note (Signed)
 Multivessel disease with moderate disease in the OM and long segments of stent in the RCA.  Also bifurcation distal RCA disease. Thankfully, has done well with no recurrent angina.  No RWMA on echo to suggest significant infarction.  He is on stable regimen currently, continue current meds: - Continue Effient  5 mg daily along with rosuvastatin  20 mg daily, metoprolol  tartrate 12.5 mg twice daily and losartan  50 mg daily.

## 2024-08-05 NOTE — Assessment & Plan Note (Signed)
 Arthritis in multiple joints managed with physical activity and topical treatments. - Continue management with physical activity and topical treatments.

## 2024-08-07 ENCOUNTER — Encounter: Attending: Registered Nurse | Admitting: Registered Nurse

## 2024-08-07 ENCOUNTER — Encounter: Payer: Self-pay | Admitting: Registered Nurse

## 2024-08-07 VITALS — BP 158/88 | HR 68 | Ht 70.0 in | Wt 211.0 lb

## 2024-08-07 DIAGNOSIS — M2550 Pain in unspecified joint: Secondary | ICD-10-CM | POA: Diagnosis present

## 2024-08-07 DIAGNOSIS — M25562 Pain in left knee: Secondary | ICD-10-CM | POA: Insufficient documentation

## 2024-08-07 DIAGNOSIS — Z5181 Encounter for therapeutic drug level monitoring: Secondary | ICD-10-CM | POA: Insufficient documentation

## 2024-08-07 DIAGNOSIS — G894 Chronic pain syndrome: Secondary | ICD-10-CM | POA: Insufficient documentation

## 2024-08-07 DIAGNOSIS — G8929 Other chronic pain: Secondary | ICD-10-CM | POA: Diagnosis present

## 2024-08-07 DIAGNOSIS — M25561 Pain in right knee: Secondary | ICD-10-CM | POA: Insufficient documentation

## 2024-08-07 DIAGNOSIS — Z9889 Other specified postprocedural states: Secondary | ICD-10-CM | POA: Diagnosis present

## 2024-08-07 DIAGNOSIS — Z79899 Other long term (current) drug therapy: Secondary | ICD-10-CM | POA: Diagnosis present

## 2024-08-07 DIAGNOSIS — Z029 Encounter for administrative examinations, unspecified: Secondary | ICD-10-CM | POA: Insufficient documentation

## 2024-08-07 DIAGNOSIS — I10 Essential (primary) hypertension: Secondary | ICD-10-CM | POA: Diagnosis present

## 2024-08-07 MED ORDER — TRAMADOL HCL 50 MG PO TABS: ORAL_TABLET | ORAL | 5 refills | Status: AC

## 2024-08-07 NOTE — Progress Notes (Signed)
 Subjective:    Patient ID: Noah Scott, male    DOB: 1935/10/26, 88 y.o.   MRN: [id number]  HPI: HOWARD BUNTE is a 88 y.o. male who returns for follow up appointment for chronic pain and medication refill. He states his pain is located in his lower back, bilateral knees and generalized joint pain. He rates his pain 8. His current exercise regime is walking and performing stretching exercises.  Mr. Bedoy Morphine equivalent is 60.00 MME.   Oral Swab was Performed today.    Pain Inventory Average Pain 8 Pain Right Now 8 My pain is aching  In the last 24 hours, has pain interfered with the following? General activity 6 Relation with others 7 Enjoyment of life 6-7 What TIME of day is your pain at its worst? evening Sleep (in general) Fair  Pain is worse with: walking and standing Pain improves with: therapy/exercise Relief from Meds: 7  Family History  Problem Relation Age of Onset   Heart disease Father        Pt does not know details   Stroke Neg Hx    Diabetes Neg Hx    Social History   Socioeconomic History   Marital status: Widowed    Spouse name: Not on file   Number of children: 1   Years of education: 69   Highest education level: Not on file  Occupational History   Occupation: retired geologist, engineering  Tobacco Use   Smoking status: Former    Current packs/day: 0.00    Types: Cigarettes    Quit date: 03/02/1969    Years since quitting: 55.4   Smokeless tobacco: Never  Vaping Use   Vaping status: Never Used  Substance and Sexual Activity   Alcohol use: Not Currently   Drug use: No   Sexual activity: Not on file  Other Topics Concern   Not on file  Social History Narrative   Lives alone in an apartment on the first floor.  Has one daughter.  Retired geologist, engineering.  Education: some college.    Social Drivers of Corporate Investment Banker Strain: Low Risk  (04/07/2024)   Overall Financial Resource Strain (CARDIA)    Difficulty of  Paying Living Expenses: Not hard at all  Food Insecurity: No Food Insecurity (04/07/2024)   Hunger Vital Sign    Worried About Running Out of Food in the Last Year: Never true    Ran Out of Food in the Last Year: Never true  Transportation Needs: No Transportation Needs (04/07/2024)   PRAPARE - Administrator, Civil Service (Medical): No    Lack of Transportation (Non-Medical): No  Physical Activity: Insufficiently Active (04/07/2024)   Exercise Vital Sign    Days of Exercise per Week: 5 days    Minutes of Exercise per Session: 10 min  Stress: No Stress Concern Present (04/07/2024)   [employer] of Occupational Health - Occupational Stress Questionnaire    Feeling of Stress: Not at all  Social Connections: Moderately Isolated (04/07/2024)   Social Connection and Isolation Panel    Frequency of Communication with Friends and Family: More than three times a week    Frequency of Social Gatherings with Friends and Family: More than three times a week    Attends Religious Services: Never    Database Administrator or Organizations: Yes    Attends Banker Meetings: 1 to 4 times per year    Marital Status: Widowed  Past Surgical History:  Procedure Laterality Date   CORONARY STENT INTERVENTION N/A 11/25/2017   Procedure: CORONARY STENT INTERVENTION;  Surgeon: Anner Alm ORN, MD;  Location: [facility] INVASIVE CV LAB;  Service: Cardiovascular:   m-dRCA 95%  (DES PCI - 0%.  Resolute ONYX DES 2.75 x 18 mm - ~3.0 mm). Ost-proxRCA 75-65% (DES PCI-0%. 2 overlapping Resolute ONYX DES 3.0 x 26 & 3.0 x 8 mm --> 3.5-3.3 mm)   CORONARY STENT INTERVENTION  2001   ([place]): PCI OM1   LEA DOPPLERS  12/12/2017   Normal bilateral ABIs and TBI's.   LEFT HEART CATH AND CORONARY ANGIOGRAPHY N/A 11/25/2017   Procedure: LEFT HEART CATH AND CORONARY ANGIOGRAPHY;  Surgeon: Anner Alm ORN, MD;  Location: [facility] INVASIVE CV LAB;  Service: Cardiovascular:  Ost-proxRCA 75&65%(DES PCI), m-dRCA  95% (DES PCI), dRCA 50%-25% ost rPDA. ostOM1 65% ISR (from 2001).    LUMBAR LAMINECTOMY/DECOMPRESSION MICRODISCECTOMY Bilateral 06/13/2018   Procedure: Laminectomy and Foraminotomy - L1-L2 - L2-L3 - L3-L4 - bilateral;  Surgeon: Joshua Alm RAMAN, MD;  Location: [facility] OR;  Service: Neurosurgery;  Laterality: Bilateral;   TRANSTHORACIC ECHOCARDIOGRAM  11/23/2017   a) 11/2017: Nl LV size & Fxn. EF 60 - 65%.  No RWMA.  GR 1 DD.  Mild RV dilation.; b) 05/2020:([facility]): Normal LV size and function.  EF 60 and 65%.  Normal RV size and function.  Aortic sclerosis with no stenosis.  No AI.  Mildly dilated ascending aorta.  CVP ~5 mm.   TRANSTHORACIC ECHOCARDIOGRAM  03/31/2021   EF 55 to 60%.  GR 1 DD.  Ascending aorta measured at 41 mm-moderate dilation.  Recommended follow-up with imaging study in 6 months to reassess.   Past Surgical History:  Procedure Laterality Date   CORONARY STENT INTERVENTION N/A 11/25/2017   Procedure: CORONARY STENT INTERVENTION;  Surgeon: Anner Alm ORN, MD;  Location: [facility] INVASIVE CV LAB;  Service: Cardiovascular:   m-dRCA 95%  (DES PCI - 0%.  Resolute ONYX DES 2.75 x 18 mm - ~3.0 mm). Ost-proxRCA 75-65% (DES PCI-0%. 2 overlapping Resolute ONYX DES 3.0 x 26 & 3.0 x 8 mm --> 3.5-3.3 mm)   CORONARY STENT INTERVENTION  2001   ([place]): PCI OM1   LEA DOPPLERS  12/12/2017   Normal bilateral ABIs and TBI's.   LEFT HEART CATH AND CORONARY ANGIOGRAPHY N/A 11/25/2017   Procedure: LEFT HEART CATH AND CORONARY ANGIOGRAPHY;  Surgeon: Anner Alm ORN, MD;  Location: [facility] INVASIVE CV LAB;  Service: Cardiovascular:  Ost-proxRCA 75&65%(DES PCI), m-dRCA 95% (DES PCI), dRCA 50%-25% ost rPDA. ostOM1 65% ISR (from 2001).    LUMBAR LAMINECTOMY/DECOMPRESSION MICRODISCECTOMY Bilateral 06/13/2018   Procedure: Laminectomy and Foraminotomy - L1-L2 - L2-L3 - L3-L4 - bilateral;  Surgeon: Joshua Alm RAMAN, MD;  Location: [facility] OR;  Service: Neurosurgery;  Laterality: Bilateral;   TRANSTHORACIC ECHOCARDIOGRAM   11/23/2017   a) 11/2017: Nl LV size & Fxn. EF 60 - 65%.  No RWMA.  GR 1 DD.  Mild RV dilation.; b) 05/2020:([facility]): Normal LV size and function.  EF 60 and 65%.  Normal RV size and function.  Aortic sclerosis with no stenosis.  No AI.  Mildly dilated ascending aorta.  CVP ~5 mm.   TRANSTHORACIC ECHOCARDIOGRAM  03/31/2021   EF 55 to 60%.  GR 1 DD.  Ascending aorta measured at 41 mm-moderate dilation.  Recommended follow-up with imaging study in 6 months to reassess.   Past Medical History:  Diagnosis Date   CAD S/P percutaneous coronary  angioplasty 2001   a) 2001 ([place]): PCI OM1 - now with ~65% ISR.;; b) NSTEMI 11/2017 - ost-prox RCA 75-65% (Overlapping ONYX DES 3.0 x 26 & 3.0 x 8 --> 3.5-3.3 mm). m-dRCA 95% (ONYX DES 2.75 x 18 mm -> 3.0 mm). Ost OM1 65% ISR (med Rx). dRCA-OstRDA (50%-25%)   Essential (primary) hypertension 11/21/2017   Hyperlipidemia 11/21/2017   Iron deficiency anemia due to chronic blood loss 08/08/2021   Iron malabsorption 08/08/2021   Neurogenic claudication due to lumbar spinal stenosis 02/06/2018   NSTEMI (non-ST elevated myocardial infarction) (HCC) 11/21/2017   Multiple RCA lesions - 2 Overlapping DES Ost-Prox RCA & 1 in mid-distal RCA   Spinal stenosis of lumbar region    Venous reflux 11/2020   Venous Dopplers 12/15/2020: No DVT or superficial venous thrombosis bilaterally.  Venous reflux noted in bilateral FV and saphenofemoral junctions, R GSV in the thigh and R SFV, left proximal thigh GSV. -->  No invasive options due to deep and superficial venous reflux.   BP (!) 165/84 (BP Location: Left Arm, Patient Position: Sitting, Cuff Size: Large)   Pulse 68   Ht 5' 10 (1.778 m)   Wt 211 lb (95.7 kg)   SpO2 96%   BMI 30.28 kg/m   Opioid Risk Score:   Fall Risk Score:  `1  Depression screen [facility] 2/9     04/07/2024    9:21 AM 02/12/2024    1:01 PM 10/14/2023    1:24 PM 08/14/2023   12:53 PM 05/13/2023    1:42 PM 10/17/2022   10:10 AM 08/13/2022   10:33 AM   Depression screen PHQ 2/9  Decreased Interest 0 0 0 0 0 0 0  Down, Depressed, Hopeless 0 0 0 0 0 0 0  PHQ - 2 Score 0 0 0 0 0 0 0  Altered sleeping     1    Tired, decreased energy     1    Change in appetite     0    Feeling bad or failure about yourself      0    Trouble concentrating     0    Moving slowly or fidgety/restless     0    Suicidal thoughts     0    PHQ-9 Score     2        Data saved with a previous flowsheet row definition      Review of Systems  Musculoskeletal:  Positive for arthralgias and back pain.       Low back pain, bilateral shoulder, knee, wrist and foot pain  All other systems reviewed and are negative.      Objective:   Physical Exam Vitals and nursing note reviewed.  Constitutional:      Appearance: Normal appearance.  Cardiovascular:     Rate and Rhythm: Normal rate and regular rhythm.     Pulses: Normal pulses.     Heart sounds: Normal heart sounds.  Pulmonary:     Effort: Pulmonary effort is normal.     Breath sounds: Normal breath sounds.  Musculoskeletal:     Comments: Normal Muscle Bulk and Muscle Testing Reveals:  Upper Extremities: Full ROM and Muscle Strength 5/5 Lumbar Paraspinal Tenderness: L-4-L-5 Lower Extremities: Full ROM and Muscle Strength 5/5 Arises from Table slowly Narrow Based  Gait     Skin:    General: Skin is warm and dry.  Neurological:     Mental Status: He is  alert and oriented to person, place, and time.  Psychiatric:        Mood and Affect: Mood normal.        Behavior: Behavior normal.          Assessment & Plan:  Lumbar Radiculitis: S/P Lumbar Laminectomy: Continue HEP as Tolerated. Continue to Monitor. 11/21/225 Acute Pain of Left Shoulder: No complaints today. Continue HEP as Tolerated. Continue to Monitor. 08/07/2024 Chronic Pain Syndrome: Refilled: Tramadol  50 mg 1- 1.5 tablet every 6 hours as needed for pain #180. We will continue the opioid monitoring program, this consists of regular  clinic visits, examinations, urine drug screen, pill counts as well as use of [place]  Controlled Substance Reporting system. A 12 month History has been reviewed on the [place]  Controlled Substance Reporting System on 08/07/2024 F/U in 6 months has a scheduled appointment with Dr Emeline

## 2024-08-11 LAB — DRUG TOX ALC METAB W/CON, ORAL FLD: Alcohol Metabolite: NEGATIVE ng/mL (ref <25)

## 2024-08-11 LAB — DRUG TOX MONITOR 1 W/CONF, ORAL FLD

## 2024-08-14 ENCOUNTER — Ambulatory Visit: Admitting: Registered Nurse

## 2024-08-19 ENCOUNTER — Telehealth: Payer: Self-pay | Admitting: *Deleted

## 2024-08-19 ENCOUNTER — Ambulatory Visit: Attending: Cardiology | Admitting: Emergency Medicine

## 2024-08-19 DIAGNOSIS — I10 Essential (primary) hypertension: Secondary | ICD-10-CM | POA: Diagnosis present

## 2024-08-19 LAB — COMPREHENSIVE METABOLIC PANEL WITH GFR
ALT: 11 IU/L (ref 0–44)
AST: 13 IU/L (ref 0–40)
Albumin: 4 g/dL (ref 3.7–4.7)
Alkaline Phosphatase: 94 IU/L (ref 48–129)
BUN/Creatinine Ratio: 15 (ref 10–24)
BUN: 14 mg/dL (ref 8–27)
Bilirubin Total: 0.3 mg/dL (ref 0.0–1.2)
CO2: 25 mmol/L (ref 20–29)
Calcium: 9.2 mg/dL (ref 8.6–10.2)
Chloride: 104 mmol/L (ref 96–106)
Creatinine, Ser: 0.95 mg/dL (ref 0.76–1.27)
Globulin, Total: 2.7 g/dL (ref 1.5–4.5)
Glucose: 101 mg/dL — ABNORMAL HIGH (ref 70–99)
Potassium: 4.4 mmol/L (ref 3.5–5.2)
Sodium: 144 mmol/L (ref 134–144)
Total Protein: 6.7 g/dL (ref 6.0–8.5)
eGFR: 78 mL/min/1.73 (ref >59)

## 2024-08-19 LAB — CBC
Hematocrit: 35.5 % — ABNORMAL LOW (ref 37.5–51.0)
Hemoglobin: 11.3 g/dL — ABNORMAL LOW (ref 13.0–17.7)
MCH: 30.3 pg (ref 26.6–33.0)
MCHC: 31.8 g/dL (ref 31.5–35.7)
MCV: 95 fL (ref 79–97)
Platelets: 223 x10E3/uL (ref 150–450)
RBC: 3.73 x10E6/uL — ABNORMAL LOW (ref 4.14–5.80)
RDW: 13 % (ref 11.6–15.4)
WBC: 5.9 x10E3/uL (ref 3.4–10.8)

## 2024-08-19 LAB — LIPID PANEL
Chol/HDL Ratio: 2.6 ratio (ref 0.0–5.0)
Cholesterol, Total: 119 mg/dL (ref 100–199)
HDL: 46 mg/dL (ref >39)
LDL Chol Calc (NIH): 59 mg/dL (ref 0–99)
Triglycerides: 69 mg/dL (ref 0–149)
VLDL Cholesterol Cal: 14 mg/dL (ref 5–40)

## 2024-08-19 MED ORDER — LOSARTAN POTASSIUM 100 MG PO TABS: 100.0000 mg | ORAL_TABLET | Freq: Every day | ORAL | 3 refills | Status: AC

## 2024-08-19 NOTE — Progress Notes (Signed)
   Nurse Visit   Date of Encounter: 08/19/2024 ID: DETROIT FRIEDEN, DOB [birth year], MRN [id number]  PCP:  Pietro Redell RAMAN, MD   [facility] Providers Cardiologist:  Alm Clay, MD      Visit Details   VS:  BP (!) 170/78 (BP Location: Right Arm, Patient Position: Sitting, Cuff Size: Normal)   Pulse 81   SpO2 97%  , BMI There is no height or weight on file to calculate BMI.  Wt Readings from Last 3 Encounters:  08/07/24 211 lb (95.7 kg)  07/27/24 210 lb 3.2 oz (95.3 kg)  04/07/24 208 lb (94.3 kg)     Reason for visit: BP Performed today: V/S Changes (medications, testing, etc.) : Increased Losartan  to 100 mg daily per Dr Clay. Patient made aware of changes. New prescription sent to preferred pharmacy  Length of Visit: 40 minutes    Medications Adjustments/Labs and Tests Ordered: No orders of the defined types were placed in this encounter.  No orders of the defined types were placed in this encounter.    Signed, Comer LITTIE Ebbs, RN  08/19/2024 10:23 AM

## 2024-08-19 NOTE — Telephone Encounter (Signed)
 Left message   for patient to call back - need to reschedule today's  nurse visit

## 2024-08-20 ENCOUNTER — Ambulatory Visit: Payer: Self-pay | Admitting: Cardiology

## 2024-09-02 ENCOUNTER — Inpatient Hospital Stay: Attending: Family

## 2024-09-02 ENCOUNTER — Encounter: Payer: Self-pay | Admitting: Family

## 2024-09-02 ENCOUNTER — Ambulatory Visit: Admitting: Family

## 2024-09-02 ENCOUNTER — Other Ambulatory Visit: Payer: Self-pay | Admitting: Medical

## 2024-09-02 VITALS — BP 155/80 | HR 70 | Temp 98.1°F | Resp 18 | Wt 214.1 lb

## 2024-09-02 DIAGNOSIS — D500 Iron deficiency anemia secondary to blood loss (chronic): Secondary | ICD-10-CM

## 2024-09-02 DIAGNOSIS — M170 Bilateral primary osteoarthritis of knee: Secondary | ICD-10-CM | POA: Diagnosis not present

## 2024-09-02 DIAGNOSIS — Z79899 Other long term (current) drug therapy: Secondary | ICD-10-CM | POA: Insufficient documentation

## 2024-09-02 DIAGNOSIS — D631 Anemia in chronic kidney disease: Secondary | ICD-10-CM | POA: Diagnosis not present

## 2024-09-02 DIAGNOSIS — M545 Low back pain, unspecified: Secondary | ICD-10-CM | POA: Diagnosis not present

## 2024-09-02 DIAGNOSIS — K909 Intestinal malabsorption, unspecified: Secondary | ICD-10-CM | POA: Diagnosis not present

## 2024-09-02 DIAGNOSIS — D509 Iron deficiency anemia, unspecified: Secondary | ICD-10-CM | POA: Diagnosis present

## 2024-09-02 LAB — CBC WITH DIFFERENTIAL (CANCER CENTER ONLY)
Abs Immature Granulocytes: 0.02 K/uL (ref 0.00–0.07)
Basophils Absolute: 0 K/uL (ref 0.0–0.1)
Basophils Relative: 1 %
Eosinophils Absolute: 0.1 K/uL (ref 0.0–0.5)
Eosinophils Relative: 2 %
HCT: 36.5 % — ABNORMAL LOW (ref 39.0–52.0)
Hemoglobin: 11.7 g/dL — ABNORMAL LOW (ref 13.0–17.0)
Immature Granulocytes: 0 %
Lymphocytes Relative: 14 %
Lymphs Abs: 0.9 K/uL (ref 0.7–4.0)
MCH: 30.5 pg (ref 26.0–34.0)
MCHC: 32.1 g/dL (ref 30.0–36.0)
MCV: 95.1 fL (ref 80.0–100.0)
Monocytes Absolute: 0.6 K/uL (ref 0.1–1.0)
Monocytes Relative: 10 %
Neutro Abs: 4.7 K/uL (ref 1.7–7.7)
Neutrophils Relative %: 73 %
Platelet Count: 192 K/uL (ref 150–400)
RBC: 3.84 MIL/uL — ABNORMAL LOW (ref 4.22–5.81)
RDW: 14.2 % (ref 11.5–15.5)
WBC Count: 6.4 K/uL (ref 4.0–10.5)
nRBC: 0 % (ref 0.0–0.2)

## 2024-09-02 LAB — RETICULOCYTES
Immature Retic Fract: 7.6 % (ref 2.3–15.9)
RBC.: 3.83 MIL/uL — ABNORMAL LOW (ref 4.22–5.81)
Retic Count, Absolute: 46.3 K/uL (ref 19.0–186.0)
Retic Ct Pct: 1.2 % (ref 0.4–3.1)

## 2024-09-02 LAB — IRON AND IRON BINDING CAPACITY (CC-WL,HP ONLY)
Iron: 52 ug/dL (ref 45–182)
Saturation Ratios: 21 % (ref 17.9–39.5)
TIBC: 255 ug/dL (ref 250–450)
UIBC: 203 ug/dL

## 2024-09-02 LAB — CMP (CANCER CENTER ONLY)
ALT: 12 U/L (ref 0–44)
AST: 20 U/L (ref 15–41)
Albumin: 4.2 g/dL (ref 3.5–5.0)
Alkaline Phosphatase: 89 U/L (ref 38–126)
Anion gap: 10 (ref 5–15)
BUN: 19 mg/dL (ref 8–23)
CO2: 29 mmol/L (ref 22–32)
Calcium: 9 mg/dL (ref 8.9–10.3)
Chloride: 104 mmol/L (ref 98–111)
Creatinine: 0.96 mg/dL (ref 0.61–1.24)
GFR, Estimated: >60 mL/min (ref >60)
Glucose, Bld: 105 mg/dL — ABNORMAL HIGH (ref 70–99)
Potassium: 4.4 mmol/L (ref 3.5–5.1)
Sodium: 142 mmol/L (ref 135–145)
Total Bilirubin: 0.3 mg/dL (ref 0.0–1.2)
Total Protein: 7.1 g/dL (ref 6.5–8.1)

## 2024-09-02 LAB — FERRITIN: Ferritin: 998 ng/mL — ABNORMAL HIGH (ref 24–336)

## 2024-09-02 NOTE — Progress Notes (Signed)
 Hematology and Oncology Follow Up Visit  Noah Scott [id number] 02-02-1936 88 y.o. 09/02/2024   Principle Diagnosis:  Iron deficiency anemia   Current Therapy:        IV iron as indicated    Interim History:  Noah Scott is here today for follow-up. He is doing fairly well but having lower back pain that keeps him up at night.  He recently got gel injections in both knees with ortho for arthritis and will call and let them know about his lower back pain.  No obvious blood loss noted.  No fever, chills, n/v, cough, rash, dizziness, SOB, chest pain, palpitations, abdominal pain or changes in bowel or bladder habits.  No numbness or tingling in his extremities.  No falls or syncope reported.  Appetite and hydration are good. Weight is stable at 214 lbs.    ECOG Performance Status: 1 - Symptomatic but completely ambulatory  Medications:  Allergies as of 09/02/2024       Reactions   Plavix  [clopidogrel  Bisulfate] Swelling   Brilinta  [ticagrelor ] Cough   Enalapril Other (See Comments)        Medication List        Accurate as of September 02, 2024  1:26 PM. If you have any questions, ask your nurse or doctor.          acetaminophen  650 MG CR tablet Commonly known as: TYLENOL  Take 650 mg by mouth every 8 (eight) hours as needed for pain.   cyanocobalamin 1000 MCG tablet Commonly known as: VITAMIN B12 Take 1,000 mcg by mouth daily.   D3 Extra Strength 125 MCG (5000 UT) Caps Take 1 capsule by mouth daily.   docusate sodium 100 MG capsule Commonly known as: COLACE Take 100 mg by mouth 2 (two) times daily.   doxazosin  8 MG tablet Commonly known as: CARDURA  Take 1 tablet (8 mg total) by mouth daily.   furosemide  20 MG tablet Commonly known as: LASIX  TAKE 2 TABLETS EVERY OTHER DAY AND 1 TABLET ON ODD DAYS. MAY TAKE AN ADDITIONAL TABLET FOR WEIGHT GAIN OF 3 LBS OVERNIGHT OR INCREASED SWELLING   losartan  100 MG tablet Commonly known as: COZAAR  Take 1 tablet (100  mg total) by mouth daily.   Magnesium Glycinate 120 MG Caps Take 240 mg by mouth daily.   metoprolol  tartrate 25 MG tablet Commonly known as: LOPRESSOR  TAKE 1/2 TABLET(12.5 MG) BY MOUTH TWICE DAILY   nitroGLYCERIN  0.6 MG SL tablet Commonly known as: NITROSTAT  Place 0.6 mg under the tongue as needed.   prasugrel  5 MG Tabs tablet Commonly known as: EFFIENT  Take 1 tablet (5 mg total) by mouth daily.   rosuvastatin  20 MG tablet Commonly known as: CRESTOR  TAKE 1 TABLET(20 MG) BY MOUTH DAILY   traMADol  50 MG tablet Commonly known as: ULTRAM  Take 1- 1.5 tablets every 6 hours as needed for pain   VICKS VAPOR INHALER IN Inhale 1 Inhaler into the lungs as needed (stuffy nose).        Allergies: Allergies[1]  Past Medical History, Surgical history, Social history, and Family History were reviewed and updated.  Review of Systems: All other 10 point review of systems is negative.   Physical Exam:  weight is 214 lb 1.9 oz (97.1 kg). His oral temperature is 98.1 F (36.7 C). His blood pressure is 155/80 (abnormal) and his pulse is 70. His respiration is 18 and oxygen saturation is 97%.   Wt Readings from Last 3 Encounters:  09/02/24 214 lb 1.9 oz (  97.1 kg)  08/07/24 211 lb (95.7 kg)  07/27/24 210 lb 3.2 oz (95.3 kg)    Ocular: Sclerae unicteric, pupils equal, round and reactive to light Ear-nose-throat: Oropharynx clear, dentition fair Lymphatic: No cervical or supraclavicular adenopathy Lungs no rales or rhonchi, good excursion bilaterally Heart regular rate and rhythm, no murmur appreciated Abd soft, nontender, positive bowel sounds MSK no focal spinal tenderness, no joint edema Neuro: non-focal, well-oriented, appropriate affect Breasts: Deferred   Lab Results  Component Value Date   WBC 6.4 09/02/2024   HGB 11.7 (L) 09/02/2024   HCT 36.5 (L) 09/02/2024   MCV 95.1 09/02/2024   PLT 192 09/02/2024   Lab Results  Component Value Date   FERRITIN 1,145 (H)  03/04/2024   IRON 51 03/04/2024   TIBC 239 (L) 03/04/2024   UIBC 188 03/04/2024   IRONPCTSAT 21 03/04/2024   Lab Results  Component Value Date   RETICCTPCT 1.2 09/02/2024   RBC 3.84 (L) 09/02/2024   Lab Results  Component Value Date   KPAFRELGTCHN 35.8 (H) 08/07/2021   LAMBDASER 21.2 08/07/2021   KAPLAMBRATIO 1.69 (H) 08/07/2021   Lab Results  Component Value Date   IGGSERUM 1,168 08/07/2021   IGA 207 08/07/2021   IGMSERUM 109 08/07/2021   Lab Results  Component Value Date   TOTALPROTELP 6.0 08/07/2021   ALBUMINELP 3.1 08/07/2021   A1GS 0.2 08/07/2021   A2GS 0.7 08/07/2021   BETS 0.8 08/07/2021   GAMS 1.1 08/07/2021   MSPIKE Not Observed 08/07/2021     Chemistry      Component Value Date/Time   NA 142 09/02/2024 1222   NA 144 08/19/2024 0944   K 4.4 09/02/2024 1222   CL 104 09/02/2024 1222   CO2 29 09/02/2024 1222   BUN 19 09/02/2024 1222   BUN 14 08/19/2024 0944   CREATININE 0.96 09/02/2024 1222      Component Value Date/Time   CALCIUM  9.0 09/02/2024 1222   ALKPHOS 89 09/02/2024 1222   AST 20 09/02/2024 1222   ALT 12 09/02/2024 1222   BILITOT 0.3 09/02/2024 1222       Impression and Plan: Noah Scott is a pleasant 88 yo gentleman with history of IDA and erythropoietin  deficiency so far with stable Hgb not requiring an ESA.  Hgb today is stable at 11.7, MCV 95.  Iron studies are pending. We will replace if needed.  Follow-up in 6 months.  Lauraine Pepper, NP 12/17/20251:26 PM     [1]  Allergies Allergen Reactions   Plavix  [Clopidogrel  Bisulfate] Swelling   Brilinta  [Ticagrelor ] Cough   Enalapril Other (See Comments)

## 2024-09-10 ENCOUNTER — Other Ambulatory Visit: Payer: Self-pay | Admitting: Medical

## 2024-09-22 ENCOUNTER — Encounter: Payer: Self-pay | Admitting: Medical

## 2024-09-22 ENCOUNTER — Ambulatory Visit (HOSPITAL_BASED_OUTPATIENT_CLINIC_OR_DEPARTMENT_OTHER)
Admission: RE | Admit: 2024-09-22 | Discharge: 2024-09-22 | Disposition: A | Discharge status: Home / Self Care | Source: Ambulatory Visit | Attending: Medical | Admitting: Medical

## 2024-09-22 ENCOUNTER — Ambulatory Visit (INDEPENDENT_AMBULATORY_CARE_PROVIDER_SITE_OTHER): Admitting: Medical

## 2024-09-22 ENCOUNTER — Telehealth: Payer: Self-pay | Admitting: Medical

## 2024-09-22 VITALS — BP 150/80 | HR 70 | Temp 98.4°F | Resp 16 | Ht 70.0 in | Wt 217.8 lb

## 2024-09-22 DIAGNOSIS — R0600 Dyspnea, unspecified: Secondary | ICD-10-CM

## 2024-09-22 DIAGNOSIS — R7989 Other specified abnormal findings of blood chemistry: Secondary | ICD-10-CM

## 2024-09-22 DIAGNOSIS — R739 Hyperglycemia, unspecified: Secondary | ICD-10-CM | POA: Diagnosis not present

## 2024-09-22 DIAGNOSIS — E785 Hyperlipidemia, unspecified: Secondary | ICD-10-CM | POA: Diagnosis not present

## 2024-09-22 DIAGNOSIS — I10 Essential (primary) hypertension: Secondary | ICD-10-CM

## 2024-09-22 DIAGNOSIS — R600 Localized edema: Secondary | ICD-10-CM | POA: Insufficient documentation

## 2024-09-22 DIAGNOSIS — Z23 Encounter for immunization: Secondary | ICD-10-CM

## 2024-09-22 NOTE — Progress Notes (Signed)
 "  Subjective:    Patient ID: Noah Scott, male    DOB: Aug 16, 1936, 89 y.o.   MRN: [id number]  HPI  Noah Scott is an 89 year old male with iron deficiency anemia and coronary artery disease who presents with concerns about elevated ferritin levels and edema.  He had blood tests on September 02, 2024 that showed elevated ferritin and mild anemia. He is not taking iron but does take zinc. He recalls a prior iron infusion.  He has coronary artery disease and is on Effient  5 mg daily and rosuvastatin . A lipid panel on August 19, 2024 showed total cholesterol 119 and LDL 59. He is on losartan , recently increased to 100 mg for higher blood pressure, which has risen from about 120/70 to 150/80.  He has pedal edema with weight gain from 211 to 217 pounds, which he attributes to fluid. He takes Lasix , alternating two tablets on even days and one tablet on odd days, and adjusts timing around activities. He has frequent urination after taking Lasix .  He has exertional shortness of breath, especially climbing stairs, that began intermittently a couple of weeks ago. He has no orthopnea or nocturnal dyspnea and sometimes notes a gurgling sensation in his chest. He smoked one pack per day for eight years and quit at age 41.  No chest pain reported or associated cardiac like signs/symptoms.  He has leg and back pain and significant lower extremity swelling requiring him to cut his socks. He elevates his legs several times daily for edema relief.       Review of Systems  Constitutional:  Negative for chills and fatigue.  HENT:  Negative for congestion and hearing loss.   Respiratory:  Positive for shortness of breath. Negative for chest tightness and wheezing.        See hpi. Mild dyspnea.  Cardiovascular:  Negative for chest pain and palpitations.  Gastrointestinal:  Negative for abdominal pain.  Genitourinary:  Negative for dysuria and frequency.  Musculoskeletal:  Negative for back pain and  neck pain.  Skin:  Negative for rash.  Neurological:  Negative for dizziness, syncope and headaches.  Hematological:  Negative for adenopathy.  Psychiatric/Behavioral:  Negative for behavioral problems and dysphoric mood. The patient is not nervous/anxious.     Past Medical History:  Diagnosis Date   CAD S/P percutaneous coronary angioplasty 2001   a) 2001 ([place]): PCI OM1 - now with ~65% ISR.;; b) NSTEMI 11/2017 - ost-prox RCA 75-65% (Overlapping ONYX DES 3.0 x 26 & 3.0 x 8 --> 3.5-3.3 mm). m-dRCA 95% (ONYX DES 2.75 x 18 mm -> 3.0 mm). Ost OM1 65% ISR (med Rx). dRCA-OstRDA (50%-25%)   Essential (primary) hypertension 11/21/2017   Hyperlipidemia 11/21/2017   Iron deficiency anemia due to chronic blood loss 08/08/2021   Iron malabsorption 08/08/2021   Neurogenic claudication due to lumbar spinal stenosis 02/06/2018   NSTEMI (non-ST elevated myocardial infarction) (HCC) 11/21/2017   Multiple RCA lesions - 2 Overlapping DES Ost-Prox RCA & 1 in mid-distal RCA   Spinal stenosis of lumbar region    Venous reflux 11/2020   Venous Dopplers 12/15/2020: No DVT or superficial venous thrombosis bilaterally.  Venous reflux noted in bilateral FV and saphenofemoral junctions, R GSV in the thigh and R SFV, left proximal thigh GSV. -->  No invasive options due to deep and superficial venous reflux.     Social History   Socioeconomic History   Marital status: Widowed    Spouse name: Not on  file   Number of children: 1   Years of education: 13   Highest education level: Not on file  Occupational History   Occupation: retired geologist, engineering  Tobacco Use   Smoking status: Former    Current packs/day: 0.00    Types: Cigarettes    Quit date: 03/02/1969    Years since quitting: 55.5   Smokeless tobacco: Never  Vaping Use   Vaping status: Never Used  Substance and Sexual Activity   Alcohol use: Not Currently   Drug use: No   Sexual activity: Not on file  Other Topics Concern   Not  on file  Social History Narrative   Lives alone in an apartment on the first floor.  Has one daughter.  Retired geologist, engineering.  Education: some college.    Social Drivers of Health   Tobacco Use: Medium Risk (09/22/2024)   Patient History    Smoking Tobacco Use: Former    Smokeless Tobacco Use: Never    Passive Exposure: Not on file  Financial Resource Strain: Low Risk (04/07/2024)   Overall Financial Resource Strain (CARDIA)    Difficulty of Paying Living Expenses: Not hard at all  Food Insecurity: No Food Insecurity (04/07/2024)   Epic    Worried About Radiation Protection Practitioner of Food in the Last Year: Never true    Ran Out of Food in the Last Year: Never true  Transportation Needs: No Transportation Needs (04/07/2024)   Epic    Lack of Transportation (Medical): No    Lack of Transportation (Non-Medical): No  Physical Activity: Insufficiently Active (04/07/2024)   Exercise Vital Sign    Days of Exercise per Week: 5 days    Minutes of Exercise per Session: 10 min  Stress: No Stress Concern Present (04/07/2024)   [employer] of Occupational Health - Occupational Stress Questionnaire    Feeling of Stress: Not at all  Social Connections: Moderately Isolated (04/07/2024)   Social Connection and Isolation Panel    Frequency of Communication with Friends and Family: More than three times a week    Frequency of Social Gatherings with Friends and Family: More than three times a week    Attends Religious Services: Never    Database Administrator or Organizations: Yes    Attends Banker Meetings: 1 to 4 times per year    Marital Status: Widowed  Intimate Partner Violence: Not At Risk (04/07/2024)   Epic    Fear of Current or Ex-Partner: No    Emotionally Abused: No    Physically Abused: No    Sexually Abused: No  Depression (PHQ2-9): Low Risk (09/02/2024)   Depression (PHQ2-9)    PHQ-2 Score: 0  Alcohol Screen: Low Risk (04/07/2024)   Alcohol Screen    Last Alcohol  Screening Score (AUDIT): 0  Housing: Unknown (04/07/2024)   Epic    Unable to Pay for Housing in the Last Year: No    Number of Times Moved in the Last Year: Not on file    Homeless in the Last Year: No  Utilities: Not At Risk (04/07/2024)   Epic    Threatened with loss of utilities: No  Health Literacy: Adequate Health Literacy (04/07/2024)   B1300 Health Literacy    Frequency of need for help with medical instructions: Never    Past Surgical History:  Procedure Laterality Date   CORONARY STENT INTERVENTION N/A 11/25/2017   Procedure: CORONARY STENT INTERVENTION;  Surgeon: Anner Alm ORN, MD;  Location: [facility] INVASIVE CV  LAB;  Service: Cardiovascular:   m-dRCA 95%  (DES PCI - 0%.  Resolute ONYX DES 2.75 x 18 mm - ~3.0 mm). Ost-proxRCA 75-65% (DES PCI-0%. 2 overlapping Resolute ONYX DES 3.0 x 26 & 3.0 x 8 mm --> 3.5-3.3 mm)   CORONARY STENT INTERVENTION  2001   ([place]): PCI OM1   LEA DOPPLERS  12/12/2017   Normal bilateral ABIs and TBI's.   LEFT HEART CATH AND CORONARY ANGIOGRAPHY N/A 11/25/2017   Procedure: LEFT HEART CATH AND CORONARY ANGIOGRAPHY;  Surgeon: Anner Alm ORN, MD;  Location: [facility] INVASIVE CV LAB;  Service: Cardiovascular:  Ost-proxRCA 75&65%(DES PCI), m-dRCA 95% (DES PCI), dRCA 50%-25% ost rPDA. ostOM1 65% ISR (from 2001).    LUMBAR LAMINECTOMY/DECOMPRESSION MICRODISCECTOMY Bilateral 06/13/2018   Procedure: Laminectomy and Foraminotomy - L1-L2 - L2-L3 - L3-L4 - bilateral;  Surgeon: Joshua Alm RAMAN, MD;  Location: [facility] OR;  Service: Neurosurgery;  Laterality: Bilateral;   TRANSTHORACIC ECHOCARDIOGRAM  11/23/2017   a) 11/2017: Nl LV size & Fxn. EF 60 - 65%.  No RWMA.  GR 1 DD.  Mild RV dilation.; b) 05/2020:([facility]): Normal LV size and function.  EF 60 and 65%.  Normal RV size and function.  Aortic sclerosis with no stenosis.  No AI.  Mildly dilated ascending aorta.  CVP ~5 mm.   TRANSTHORACIC ECHOCARDIOGRAM  03/31/2021   EF 55 to 60%.  GR 1 DD.  Ascending aorta measured at 41  mm-moderate dilation.  Recommended follow-up with imaging study in 6 months to reassess.    Family History  Problem Relation Age of Onset   Heart disease Father        Pt does not know details   Stroke Neg Hx    Diabetes Neg Hx     Allergies[1]  Medications Ordered Prior to Encounter[2]  BP (!) 150/80   Pulse 70   Temp 98.4 F (36.9 C) (Oral)   Resp 16   Ht 5' 10 (1.778 m)   Wt 217 lb 12.8 oz (98.8 kg)   SpO2 97%   BMI 31.25 kg/m           Objective:   Physical Exam  General Mental Status- Alert. General Appearance- Not in acute distress.   Skin General: Color- Normal Color. Moisture- Normal Moisture.  Neck Carotid Arteries- Normal color. Moisture- Normal Moisture. No carotid bruits. No JVD.  Chest and Lung Exam Auscultation: Breath Sounds:-Normal.  Cardiovascular Auscultation:Rythm- Regular. Murmurs & Other Heart Sounds:Auscultation of the heart reveals- No Murmurs.  Abdomen Inspection:-Inspeection Normal. Palpation/Percussion:Note:No mass. Palpation and Percussion of the abdomen reveal- Non Tender, Non Distended + BS, no rebound or guarding.    Neurologic Cranial Nerve exam:- CN III-XII intact(No nystagmus), symmetric smile. Drift Test:- No drift. Romberg Exam:- Negative.  Heal to Toe Gait exam:-Normal. Finger to Nose:- Normal/Intact Strength:- 5/5 equal and symmetric strength both upper and lower extremities.   Lower ext- 1+ pedal edema bilaterally. Negative homans signs. Symmetric calfs.    Assessment & Plan:   Bilateral leg edema Recent onset with exertional dyspnea, likely due to fluid retention. Differential includes heart failure and gravity edema. History of coronary artery disease and hypertension. Recent increase in losartan  dosage. Current diuretic regimen includes Lasix . - Ordered chest x-ray to evaluate for pulmonary edema. - Ordered BNP to assess for heart failure. - Ordered metabolic panel to evaluate kidney function and  potassium levels. - Coordinate with cardiologist for further evaluation if needed.  Hypertension Recent increase in blood pressure,. Current regimen includes losartan ,  lasix  and Lopressor . Recent readings elevated at 150/80 mmHg. - Monitor blood pressure regularly.  Coronary artery disease Managed with Effient  and rosuvastatin . Lipid panel within target range. - Continue Effient  5 mg daily. - Continue rosuvastatin  as prescribed.  Hyperlipidemia Lipid panel within target range. - Continue current lipid-lowering therapy.  Iron metabolism abnormality with history of iron deficiency anemia Recent labs show elevated ferritin levels. History of iron deficiency anemia with previous iron infusions. No current iron supplementation. - Contact hematologist for interpretation of recent labs and guidance on further management.  Hyperglycemia Recent glucose level slightly elevated at 105 mg/dL.  Follow up date to be determined after lab and imaging review  Dallas Maxwell, PA-C    I personally spent a total of 48 minutes in the care of the patient today including performing a medically appropriate exam/evaluation, counseling and educating, placing orders, referring and communicating with other health care professionals, and documenting clinical information in the EHR.      [1]  Allergies Allergen Reactions   Plavix  [Clopidogrel  Bisulfate] Swelling   Brilinta  [Ticagrelor ] Cough   Enalapril Other (See Comments)  [2]  Current Outpatient Medications on File Prior to Visit  Medication Sig Dispense Refill   acetaminophen  (TYLENOL ) 650 MG CR tablet Take 650 mg by mouth every 8 (eight) hours as needed for pain.     Aromatic Inhalants (VICKS VAPOR INHALER IN) Inhale 1 Inhaler into the lungs as needed (stuffy nose).     Cholecalciferol (D3 EXTRA STRENGTH) 125 MCG (5000 UT) CAPS Take 1 capsule by mouth daily.     docusate sodium (COLACE) 100 MG capsule Take 100 mg by mouth 2 (two) times daily.      doxazosin  (CARDURA ) 8 MG tablet TAKE 1 TABLET(8 MG) BY MOUTH DAILY 30 tablet 11   furosemide  (LASIX ) 20 MG tablet TAKE 2 TABLETS EVERY OTHER DAY AND 1 TABLET ON ODD DAYS. MAY TAKE AN ADDITIONAL TABLET FOR WEIGHT GAIN OF 3 LBS OVERNIGHT OR INCREASED SWELLING 200 tablet 3   losartan  (COZAAR ) 100 MG tablet Take 1 tablet (100 mg total) by mouth daily. 90 tablet 3   Magnesium Glycinate 120 MG CAPS Take 240 mg by mouth daily.     metoprolol  tartrate (LOPRESSOR ) 25 MG tablet TAKE 1/2 TABLET(12.5 MG) BY MOUTH TWICE DAILY 90 tablet 1   nitroGLYCERIN  (NITROSTAT ) 0.6 MG SL tablet Place 0.6 mg under the tongue as needed.     prasugrel  (EFFIENT ) 5 MG TABS tablet Take 1 tablet (5 mg total) by mouth daily. 90 tablet 3   rosuvastatin  (CRESTOR ) 20 MG tablet TAKE 1 TABLET(20 MG) BY MOUTH DAILY 90 tablet 3   traMADol  (ULTRAM ) 50 MG tablet Take 1- 1.5 tablets every 6 hours as needed for pain 180 tablet 5   cyanocobalamin (VITAMIN B12) 1000 MCG tablet Take 1,000 mcg by mouth daily. (Patient not taking: Reported on 09/22/2024)     No current facility-administered medications on file prior to visit.   "

## 2024-09-22 NOTE — Patient Instructions (Addendum)
 Bilateral leg edema Recent onset with exertional dyspnea, likely due to fluid retention. Differential includes heart failure and gravity edema. History of coronary artery disease and hypertension. Recent increase in losartan  dosage. Current diuretic regimen includes Lasix . - Ordered chest x-ray to evaluate for pulmonary edema. - Ordered BNP to assess for heart failure. - Ordered metabolic panel to evaluate kidney function and potassium levels. - Coordinate with cardiologist for further evaluation if needed.  Hypertension Recent increase in blood pressure,. Current regimen includes losartan , lasix  and Lopressor . Recent readings elevated at 150/80 mmHg. - Monitor blood pressure regularly. -may adjust  meds after lab and xray review  Coronary artery disease Managed with Effient  and rosuvastatin . Lipid panel within target range. - Continue Effient  5 mg daily. - Continue rosuvastatin  as prescribed.  Hyperlipidemia Lipid panel within target range. - Continue current lipid-lowering therapy.  Iron metabolism abnormality with history of iron deficiency anemia Recent labs show elevated ferritin levels. History of iron deficiency anemia with previous iron infusions. No current iron supplementation. - Contact hematologist for interpretation of recent labs and guidance on further management.  Hyperglycemia Recent glucose level slightly elevated at 105 mg/dL.  Follow up date to be determined after lab and imaging review

## 2024-09-22 NOTE — Telephone Encounter (Signed)
 Lauraine,  I saw mutual patient and he was asking about his 09-02-2024 lab results(looks like done on day of visit with hematology). I was not sure of plan going forward regarding his anemia and elevated ferritin levels. I did not see result note and pt states he was not contacted. He also states not sure when he should do those future labs. Thanks for your help with our patients. He does not access my chart.   Thanks for your help.  Liya Strollo, PA-C

## 2024-09-23 ENCOUNTER — Ambulatory Visit: Payer: Self-pay | Admitting: Medical

## 2024-09-23 ENCOUNTER — Telehealth: Payer: Self-pay

## 2024-09-23 LAB — COMPREHENSIVE METABOLIC PANEL WITH GFR
ALT: 9 U/L (ref 3–53)
AST: 15 U/L (ref 5–37)
Albumin: 4.1 g/dL (ref 3.5–5.2)
Alkaline Phosphatase: 73 U/L (ref 39–117)
BUN: 19 mg/dL (ref 6–23)
CO2: 29 meq/L (ref 19–32)
Calcium: 8.5 mg/dL (ref 8.4–10.5)
Chloride: 105 meq/L (ref 96–112)
Creatinine, Ser: 0.81 mg/dL (ref 0.40–1.50)
GFR: 80.09 mL/min
Glucose, Bld: 93 mg/dL (ref 70–99)
Potassium: 3.7 meq/L (ref 3.5–5.1)
Sodium: 142 meq/L (ref 135–145)
Total Bilirubin: 0.3 mg/dL (ref 0.2–1.2)
Total Protein: 6.6 g/dL (ref 6.0–8.3)

## 2024-09-23 LAB — HEMOGLOBIN A1C: Hgb A1c MFr Bld: 5.9 % (ref 4.6–6.5)

## 2024-09-23 LAB — BRAIN NATRIURETIC PEPTIDE: Pro B Natriuretic peptide (BNP): 92 pg/mL (ref 1.0–100.0)

## 2024-09-23 NOTE — Telephone Encounter (Signed)
 Per Lauraine Pepper: Iron saturation was good! Ferritin is reactive and chronically elevated due to back issues and other arthralgias. Pt advised and states understanding.

## 2024-09-29 ENCOUNTER — Ambulatory Visit: Payer: Self-pay | Admitting: Medical

## 2024-09-29 ENCOUNTER — Ambulatory Visit: Admitting: Medical

## 2024-09-29 ENCOUNTER — Ambulatory Visit (HOSPITAL_BASED_OUTPATIENT_CLINIC_OR_DEPARTMENT_OTHER)
Admission: RE | Admit: 2024-09-29 | Discharge: 2024-09-29 | Disposition: A | Discharge status: Home / Self Care | Source: Ambulatory Visit | Attending: Medical | Admitting: Medical

## 2024-09-29 VITALS — BP 128/72 | HR 70 | Temp 98.4°F | Resp 14 | Ht 70.0 in | Wt 215.4 lb

## 2024-09-29 DIAGNOSIS — Z23 Encounter for immunization: Secondary | ICD-10-CM

## 2024-09-29 DIAGNOSIS — I10 Essential (primary) hypertension: Secondary | ICD-10-CM | POA: Diagnosis not present

## 2024-09-29 DIAGNOSIS — J90 Pleural effusion, not elsewhere classified: Secondary | ICD-10-CM

## 2024-09-29 DIAGNOSIS — R0600 Dyspnea, unspecified: Secondary | ICD-10-CM

## 2024-09-29 DIAGNOSIS — R600 Localized edema: Secondary | ICD-10-CM | POA: Diagnosis not present

## 2024-09-29 NOTE — Progress Notes (Signed)
 "  Subjective:    Patient ID: Noah Scott, male    DOB: 11/03/1935, 89 y.o.   MRN: [id number]  HPI  Noah Scott is an 89 year old male with heart failure who presents with improved shortness of breath and swelling.  He notes marked improvement in shortness of breath and leg swelling. He can now climb the three stairs to his apartment without prior breathlessness. Elevating his legs three times daily for 20 minutes and frequent urination correlate with reduced edema.  He had a recent eight-pound fluid-related weight gain but has since seen weight decrease with improvement in swelling and breathing. Home blood pressure has improved from 150/80 mmHg to 128/72 mmHg.  He follows a diuretic regimen of two tablets every other day and one tablet on odd days, with an extra tablet if he gains three pounds overnight. Over the past four days on this regimen he has had significant symptomatic improvement.       Review of Systems  Constitutional:  Negative for chills and fatigue.  HENT:  Negative for congestion and hearing loss.   Respiratory:  Negative for chest tightness, shortness of breath and wheezing.   Cardiovascular:  Negative for chest pain and palpitations.  Gastrointestinal:  Negative for abdominal pain.  Genitourinary:  Negative for dysuria and frequency.  Musculoskeletal:  Negative for back pain and neck pain.  Skin:  Negative for rash.  Neurological:  Negative for dizziness, syncope and headaches.  Hematological:  Negative for adenopathy.  Psychiatric/Behavioral:  Negative for behavioral problems and dysphoric mood. The patient is not nervous/anxious.     Past Medical History:  Diagnosis Date   CAD S/P percutaneous coronary angioplasty 2001   a) 2001 ([place]): PCI OM1 - now with ~65% ISR.;; b) NSTEMI 11/2017 - ost-prox RCA 75-65% (Overlapping ONYX DES 3.0 x 26 & 3.0 x 8 --> 3.5-3.3 mm). m-dRCA 95% (ONYX DES 2.75 x 18 mm -> 3.0 mm). Ost OM1 65% ISR (med Rx). dRCA-OstRDA  (50%-25%)   Essential (primary) hypertension 11/21/2017   Hyperlipidemia 11/21/2017   Iron deficiency anemia due to chronic blood loss 08/08/2021   Iron malabsorption 08/08/2021   Neurogenic claudication due to lumbar spinal stenosis 02/06/2018   NSTEMI (non-ST elevated myocardial infarction) (HCC) 11/21/2017   Multiple RCA lesions - 2 Overlapping DES Ost-Prox RCA & 1 in mid-distal RCA   Spinal stenosis of lumbar region    Venous reflux 11/2020   Venous Dopplers 12/15/2020: No DVT or superficial venous thrombosis bilaterally.  Venous reflux noted in bilateral FV and saphenofemoral junctions, R GSV in the thigh and R SFV, left proximal thigh GSV. -->  No invasive options due to deep and superficial venous reflux.     Social History   Socioeconomic History   Marital status: Widowed    Spouse name: Not on file   Number of children: 1   Years of education: 53   Highest education level: Not on file  Occupational History   Occupation: retired geologist, engineering  Tobacco Use   Smoking status: Former    Current packs/day: 0.00    Types: Cigarettes    Quit date: 03/02/1969    Years since quitting: 55.6   Smokeless tobacco: Never  Vaping Use   Vaping status: Never Used  Substance and Sexual Activity   Alcohol use: Not Currently   Drug use: No   Sexual activity: Not on file  Other Topics Concern   Not on file  Social History Narrative   Lives  alone in an apartment on the first floor.  Has one daughter.  Retired geologist, engineering.  Education: some college.    Social Drivers of Health   Tobacco Use: Medium Risk (09/22/2024)   Patient History    Smoking Tobacco Use: Former    Smokeless Tobacco Use: Never    Passive Exposure: Not on file  Financial Resource Strain: Low Risk (04/07/2024)   Overall Financial Resource Strain (CARDIA)    Difficulty of Paying Living Expenses: Not Scott at all  Food Insecurity: No Food Insecurity (04/07/2024)   Epic    Worried About Radiation Protection Practitioner of  Food in the Last Year: Never true    Ran Out of Food in the Last Year: Never true  Transportation Needs: No Transportation Needs (04/07/2024)   Epic    Lack of Transportation (Medical): No    Lack of Transportation (Non-Medical): No  Physical Activity: Insufficiently Active (04/07/2024)   Exercise Vital Sign    Days of Exercise per Week: 5 days    Minutes of Exercise per Session: 10 min  Stress: No Stress Concern Present (04/07/2024)   [employer] of Occupational Health - Occupational Stress Questionnaire    Feeling of Stress: Not at all  Social Connections: Moderately Isolated (04/07/2024)   Social Connection and Isolation Panel    Frequency of Communication with Friends and Family: More than three times a week    Frequency of Social Gatherings with Friends and Family: More than three times a week    Attends Religious Services: Never    Database Administrator or Organizations: Yes    Attends Banker Meetings: 1 to 4 times per year    Marital Status: Widowed  Intimate Partner Violence: Not At Risk (04/07/2024)   Epic    Fear of Current or Ex-Partner: No    Emotionally Abused: No    Physically Abused: No    Sexually Abused: No  Depression (PHQ2-9): Low Risk (09/02/2024)   Depression (PHQ2-9)    PHQ-2 Score: 0  Alcohol Screen: Low Risk (04/07/2024)   Alcohol Screen    Last Alcohol Screening Score (AUDIT): 0  Housing: Unknown (04/07/2024)   Epic    Unable to Pay for Housing in the Last Year: No    Number of Times Moved in the Last Year: Not on file    Homeless in the Last Year: No  Utilities: Not At Risk (04/07/2024)   Epic    Threatened with loss of utilities: No  Health Literacy: Adequate Health Literacy (04/07/2024)   B1300 Health Literacy    Frequency of need for help with medical instructions: Never    Past Surgical History:  Procedure Laterality Date   CORONARY STENT INTERVENTION N/A 11/25/2017   Procedure: CORONARY STENT INTERVENTION;  Surgeon: Anner Alm ORN, MD;  Location: [facility] INVASIVE CV LAB;  Service: Cardiovascular:   m-dRCA 95%  (DES PCI - 0%.  Resolute ONYX DES 2.75 x 18 mm - ~3.0 mm). Ost-proxRCA 75-65% (DES PCI-0%. 2 overlapping Resolute ONYX DES 3.0 x 26 & 3.0 x 8 mm --> 3.5-3.3 mm)   CORONARY STENT INTERVENTION  2001   ([place]): PCI OM1   LEA DOPPLERS  12/12/2017   Normal bilateral ABIs and TBI's.   LEFT HEART CATH AND CORONARY ANGIOGRAPHY N/A 11/25/2017   Procedure: LEFT HEART CATH AND CORONARY ANGIOGRAPHY;  Surgeon: Anner Alm ORN, MD;  Location: [facility] INVASIVE CV LAB;  Service: Cardiovascular:  Ost-proxRCA 75&65%(DES PCI), m-dRCA 95% (DES PCI), dRCA 50%-25% ost  rPDA. ostOM1 65% ISR (from 2001).    LUMBAR LAMINECTOMY/DECOMPRESSION MICRODISCECTOMY Bilateral 06/13/2018   Procedure: Laminectomy and Foraminotomy - L1-L2 - L2-L3 - L3-L4 - bilateral;  Surgeon: Joshua Alm RAMAN, MD;  Location: [facility] OR;  Service: Neurosurgery;  Laterality: Bilateral;   TRANSTHORACIC ECHOCARDIOGRAM  11/23/2017   a) 11/2017: Nl LV size & Fxn. EF 60 - 65%.  No RWMA.  GR 1 DD.  Mild RV dilation.; b) 05/2020:([facility]): Normal LV size and function.  EF 60 and 65%.  Normal RV size and function.  Aortic sclerosis with no stenosis.  No AI.  Mildly dilated ascending aorta.  CVP ~5 mm.   TRANSTHORACIC ECHOCARDIOGRAM  03/31/2021   EF 55 to 60%.  GR 1 DD.  Ascending aorta measured at 41 mm-moderate dilation.  Recommended follow-up with imaging study in 6 months to reassess.    Family History  Problem Relation Age of Onset   Heart disease Father        Pt does not know details   Stroke Neg Hx    Diabetes Neg Hx     Allergies[1]  Medications Ordered Prior to Encounter[2]  BP 128/72   Pulse 70   Temp 98.4 F (36.9 C) (Oral)   Resp 14   Ht 5' 10 (1.778 m)   Wt 215 lb 6.4 oz (97.7 kg)   SpO2 98%   BMI 30.91 kg/m           Objective:   Physical Exam  General- No acute distress. Pleasant patient. Neck- Full range of motion, no jvd Lungs- Clear, even  and unlabored. Heart- regular rate and rhythm. Neurologic- CNII- XII grossly intact.  Lower ext- prior bilateral pedal edema much improved/resolved. No present swelling. Calfs symmetric. Negative homans signs.       Assessment & Plan:   Pleural effusion with resolved pedal edema and resolved dyspnea.  Suspect CHF flair mild. Improved symptoms with reduced shortness of breath and decreased edema as lasix  increased over past 4 days. Recent weight gain of 8 pounds with small pleural effusions on x-ray. Blood pressure improved to 128/72. Continues on diuretic therapy with Lasix . - Ordered chest x-ray to assess pleural effusions. - Continue current diuretic regimen with Lasix , adjusting dosage based on weight gain and edema.(follow instruction of cardiologist strictly) - Monitor weight daily and adjust Lasix  dosage if weight gain exceeds 3 pounds in one day. - Scheduled follow-up in 3 months or sooner if needed.  Essential hypertension Blood pressure improved to 128/72. Weight loss and fluid management contributing to improved control. - Continue current antihypertensive regimen. losartan , metoprolol  and lasix .  General health maintenance Due for PCV 20 vaccine. Recent flu vaccine received. - Administered PCV 20 vaccine.  Follow up 3 months or sooner if needed.    [1]  Allergies Allergen Reactions   Plavix  [Clopidogrel  Bisulfate] Swelling   Brilinta  [Ticagrelor ] Cough   Enalapril Other (See Comments)  [2]  Current Outpatient Medications on File Prior to Visit  Medication Sig Dispense Refill   acetaminophen  (TYLENOL ) 650 MG CR tablet Take 650 mg by mouth every 8 (eight) hours as needed for pain.     Aromatic Inhalants (VICKS VAPOR INHALER IN) Inhale 1 Inhaler into the lungs as needed (stuffy nose).     Cholecalciferol (D3 EXTRA STRENGTH) 125 MCG (5000 UT) CAPS Take 1 capsule by mouth daily.     docusate sodium (COLACE) 100 MG capsule Take 100 mg by mouth 2 (two) times daily.      doxazosin  (CARDURA )  8 MG tablet TAKE 1 TABLET(8 MG) BY MOUTH DAILY 30 tablet 11   furosemide  (LASIX ) 20 MG tablet TAKE 2 TABLETS EVERY OTHER DAY AND 1 TABLET ON ODD DAYS. MAY TAKE AN ADDITIONAL TABLET FOR WEIGHT GAIN OF 3 LBS OVERNIGHT OR INCREASED SWELLING 200 tablet 3   losartan  (COZAAR ) 100 MG tablet Take 1 tablet (100 mg total) by mouth daily. 90 tablet 3   Magnesium Glycinate 120 MG CAPS Take 240 mg by mouth daily.     metoprolol  tartrate (LOPRESSOR ) 25 MG tablet TAKE 1/2 TABLET(12.5 MG) BY MOUTH TWICE DAILY 90 tablet 1   nitroGLYCERIN  (NITROSTAT ) 0.6 MG SL tablet Place 0.6 mg under the tongue as needed.     prasugrel  (EFFIENT ) 5 MG TABS tablet Take 1 tablet (5 mg total) by mouth daily. 90 tablet 3   rosuvastatin  (CRESTOR ) 20 MG tablet TAKE 1 TABLET(20 MG) BY MOUTH DAILY 90 tablet 3   traMADol  (ULTRAM ) 50 MG tablet Take 1- 1.5 tablets every 6 hours as needed for pain 180 tablet 5   cyanocobalamin (VITAMIN B12) 1000 MCG tablet Take 1,000 mcg by mouth daily. (Patient not taking: Reported on 09/29/2024)     No current facility-administered medications on file prior to visit.   "

## 2024-09-29 NOTE — Patient Instructions (Signed)
 Pleural effusion with resolved pedal edema and resolved dyspnea.  Suspect CHF flair mild. Improved symptoms with reduced shortness of breath and decreased edema as lasix  increased over past 4 days. Recent weight gain of 8 pounds with small pleural effusions on x-ray. Blood pressure improved to 128/72. Continues on diuretic therapy with Lasix . - Ordered chest x-ray to assess pleural effusions. - Continue current diuretic regimen with Lasix , adjusting dosage based on weight gain and edema.(follow instruction of cardiologist strictly) - Monitor weight daily and adjust Lasix  dosage if weight gain exceeds 3 pounds in one day. - Scheduled follow-up in 3 months or sooner if needed.  Essential hypertension Blood pressure improved to 128/72. Weight loss and fluid management contributing to improved control. - Continue current antihypertensive regimen. losartan , metoprolol  and lasix .  General health maintenance Due for PCV 20 vaccine. Recent flu vaccine received. - Administered PCV 20 vaccine.  Follow up 3 months or sooner if needed.

## 2024-09-30 NOTE — Progress Notes (Signed)
 Pt called and aware apt scheduled for 3/13

## 2024-10-19 ENCOUNTER — Other Ambulatory Visit: Payer: Self-pay | Admitting: Medical

## 2024-10-20 ENCOUNTER — Other Ambulatory Visit: Payer: Self-pay

## 2024-10-20 DIAGNOSIS — D500 Iron deficiency anemia secondary to blood loss (chronic): Secondary | ICD-10-CM

## 2024-10-22 ENCOUNTER — Ambulatory Visit: Payer: Self-pay | Admitting: Cardiology

## 2024-10-22 LAB — HEMOGLOBIN AND HEMATOCRIT, BLOOD
Hematocrit: 35.1 % — ABNORMAL LOW (ref 37.5–51.0)
Hemoglobin: 11.7 g/dL — ABNORMAL LOW (ref 13.0–17.7)

## 2024-10-23 NOTE — Telephone Encounter (Signed)
"   RN called patient  to review labs  Voiced understanding.  Patient had a question what did Dr Anner think of him  using tramadol  for his arthritic pain   He states he use tylenol  at present time.   Wl send message for review and response  "

## 2024-11-27 ENCOUNTER — Ambulatory Visit: Admitting: Medical

## 2024-12-28 ENCOUNTER — Ambulatory Visit: Admitting: Medical

## 2025-02-03 ENCOUNTER — Encounter: Admitting: Physical Medicine and Rehabilitation

## 2025-03-03 ENCOUNTER — Inpatient Hospital Stay: Admitting: Family

## 2025-03-03 ENCOUNTER — Inpatient Hospital Stay

## 2025-04-13 ENCOUNTER — Ambulatory Visit
